# Patient Record
Sex: Female | Born: 1935 | Race: White | Hispanic: No | Marital: Married | Smoking: Former smoker
Health system: Southern US, Community
[De-identification: ages and names within clinical notes are randomized; demographics above are authoritative.]

## PROBLEM LIST (undated history)

## (undated) DIAGNOSIS — Z9289 Personal history of other medical treatment: Secondary | ICD-10-CM

## (undated) DIAGNOSIS — R51 Headache: Secondary | ICD-10-CM

## (undated) DIAGNOSIS — G5620 Lesion of ulnar nerve, unspecified upper limb: Secondary | ICD-10-CM

## (undated) DIAGNOSIS — R7301 Impaired fasting glucose: Secondary | ICD-10-CM

## (undated) DIAGNOSIS — I10 Essential (primary) hypertension: Secondary | ICD-10-CM

## (undated) DIAGNOSIS — Z9981 Dependence on supplemental oxygen: Secondary | ICD-10-CM

## (undated) DIAGNOSIS — R519 Headache, unspecified: Secondary | ICD-10-CM

## (undated) DIAGNOSIS — M1990 Unspecified osteoarthritis, unspecified site: Secondary | ICD-10-CM

## (undated) DIAGNOSIS — R011 Cardiac murmur, unspecified: Secondary | ICD-10-CM

## (undated) DIAGNOSIS — N183 Chronic kidney disease, stage 3 unspecified: Secondary | ICD-10-CM

## (undated) DIAGNOSIS — F419 Anxiety disorder, unspecified: Secondary | ICD-10-CM

## (undated) DIAGNOSIS — I6529 Occlusion and stenosis of unspecified carotid artery: Secondary | ICD-10-CM

## (undated) DIAGNOSIS — J189 Pneumonia, unspecified organism: Secondary | ICD-10-CM

## (undated) DIAGNOSIS — I2510 Atherosclerotic heart disease of native coronary artery without angina pectoris: Secondary | ICD-10-CM

## (undated) DIAGNOSIS — K219 Gastro-esophageal reflux disease without esophagitis: Secondary | ICD-10-CM

## (undated) DIAGNOSIS — I5033 Acute on chronic diastolic (congestive) heart failure: Secondary | ICD-10-CM

## (undated) DIAGNOSIS — M810 Age-related osteoporosis without current pathological fracture: Secondary | ICD-10-CM

## (undated) DIAGNOSIS — E785 Hyperlipidemia, unspecified: Secondary | ICD-10-CM

## (undated) DIAGNOSIS — F0390 Unspecified dementia without behavioral disturbance: Secondary | ICD-10-CM

## (undated) DIAGNOSIS — J42 Unspecified chronic bronchitis: Secondary | ICD-10-CM

## (undated) DIAGNOSIS — I509 Heart failure, unspecified: Secondary | ICD-10-CM

## (undated) DIAGNOSIS — J302 Other seasonal allergic rhinitis: Secondary | ICD-10-CM

## (undated) DIAGNOSIS — I214 Non-ST elevation (NSTEMI) myocardial infarction: Secondary | ICD-10-CM

## (undated) DIAGNOSIS — J449 Chronic obstructive pulmonary disease, unspecified: Secondary | ICD-10-CM

## (undated) DIAGNOSIS — R001 Bradycardia, unspecified: Secondary | ICD-10-CM

## (undated) HISTORY — PX: DILATION AND CURETTAGE OF UTERUS: SHX78

## (undated) HISTORY — PX: FRACTURE SURGERY: SHX138

## (undated) HISTORY — DX: Impaired fasting glucose: R73.01

## (undated) HISTORY — DX: Essential (primary) hypertension: I10

## (undated) HISTORY — PX: TOTAL HIP ARTHROPLASTY: SHX124

## (undated) HISTORY — DX: Atherosclerotic heart disease of native coronary artery without angina pectoris: I25.10

## (undated) HISTORY — DX: Other seasonal allergic rhinitis: J30.2

## (undated) HISTORY — DX: Chronic obstructive pulmonary disease, unspecified: J44.9

## (undated) HISTORY — DX: Hyperlipidemia, unspecified: E78.5

## (undated) HISTORY — DX: Occlusion and stenosis of unspecified carotid artery: I65.29

## (undated) HISTORY — PX: CORONARY ANGIOPLASTY WITH STENT PLACEMENT: SHX49

## (undated) HISTORY — DX: Lesion of ulnar nerve, unspecified upper limb: G56.20

## (undated) HISTORY — DX: Pneumonia, unspecified organism: J18.9

## (undated) HISTORY — DX: Age-related osteoporosis without current pathological fracture: M81.0

## (undated) HISTORY — DX: Bradycardia, unspecified: R00.1

## (undated) HISTORY — DX: Unspecified dementia, unspecified severity, without behavioral disturbance, psychotic disturbance, mood disturbance, and anxiety: F03.90

---

## 1982-02-14 HISTORY — PX: VAGINAL HYSTERECTOMY: SUR661

## 1989-02-14 HISTORY — PX: INCONTINENCE SURGERY: SHX676

## 2001-07-23 ENCOUNTER — Ambulatory Visit (HOSPITAL_COMMUNITY): Admission: RE | Admit: 2001-07-23 | Discharge: 2001-07-23 | Payer: Self-pay | Admitting: Family Medicine

## 2001-07-23 ENCOUNTER — Encounter: Payer: Self-pay | Admitting: Family Medicine

## 2002-05-02 ENCOUNTER — Encounter: Payer: Self-pay | Admitting: Family Medicine

## 2002-05-02 ENCOUNTER — Ambulatory Visit (HOSPITAL_COMMUNITY): Admission: RE | Admit: 2002-05-02 | Discharge: 2002-05-02 | Payer: Self-pay | Admitting: Family Medicine

## 2003-10-24 ENCOUNTER — Ambulatory Visit (HOSPITAL_COMMUNITY): Admission: RE | Admit: 2003-10-24 | Discharge: 2003-10-24 | Payer: Self-pay | Admitting: Family Medicine

## 2005-06-20 ENCOUNTER — Ambulatory Visit (HOSPITAL_COMMUNITY): Admission: RE | Admit: 2005-06-20 | Discharge: 2005-06-20 | Payer: Self-pay | Admitting: Family Medicine

## 2005-12-03 ENCOUNTER — Inpatient Hospital Stay (HOSPITAL_COMMUNITY): Admission: EM | Admit: 2005-12-03 | Discharge: 2005-12-10 | Payer: Self-pay | Admitting: Emergency Medicine

## 2006-01-09 ENCOUNTER — Ambulatory Visit (HOSPITAL_COMMUNITY): Admission: RE | Admit: 2006-01-09 | Discharge: 2006-01-09 | Payer: Self-pay | Admitting: Family Medicine

## 2006-02-14 HISTORY — PX: CARPAL TUNNEL RELEASE: SHX101

## 2006-06-13 ENCOUNTER — Ambulatory Visit (HOSPITAL_BASED_OUTPATIENT_CLINIC_OR_DEPARTMENT_OTHER): Admission: RE | Admit: 2006-06-13 | Discharge: 2006-06-13 | Payer: Self-pay | Admitting: Orthopedic Surgery

## 2006-09-05 ENCOUNTER — Emergency Department (HOSPITAL_COMMUNITY): Admission: EM | Admit: 2006-09-05 | Discharge: 2006-09-05 | Payer: Self-pay | Admitting: Emergency Medicine

## 2007-02-15 DIAGNOSIS — M810 Age-related osteoporosis without current pathological fracture: Secondary | ICD-10-CM

## 2007-02-15 HISTORY — DX: Age-related osteoporosis without current pathological fracture: M81.0

## 2007-03-19 ENCOUNTER — Ambulatory Visit (HOSPITAL_COMMUNITY): Admission: RE | Admit: 2007-03-19 | Discharge: 2007-03-19 | Payer: Self-pay | Admitting: Family Medicine

## 2007-04-19 ENCOUNTER — Inpatient Hospital Stay (HOSPITAL_COMMUNITY): Admission: EM | Admit: 2007-04-19 | Discharge: 2007-04-26 | Payer: Self-pay | Admitting: Emergency Medicine

## 2007-05-23 ENCOUNTER — Ambulatory Visit (HOSPITAL_COMMUNITY): Admission: RE | Admit: 2007-05-23 | Discharge: 2007-05-23 | Payer: Self-pay | Admitting: Family Medicine

## 2007-06-29 ENCOUNTER — Encounter: Payer: Self-pay | Admitting: Internal Medicine

## 2007-07-10 ENCOUNTER — Ambulatory Visit (HOSPITAL_COMMUNITY): Admission: RE | Admit: 2007-07-10 | Discharge: 2007-07-10 | Payer: Self-pay | Admitting: Family Medicine

## 2007-07-16 ENCOUNTER — Ambulatory Visit (HOSPITAL_COMMUNITY): Admission: RE | Admit: 2007-07-16 | Discharge: 2007-07-16 | Payer: Self-pay | Admitting: Family Medicine

## 2007-07-19 ENCOUNTER — Ambulatory Visit: Payer: Self-pay | Admitting: Internal Medicine

## 2007-07-19 DIAGNOSIS — F0390 Unspecified dementia without behavioral disturbance: Secondary | ICD-10-CM

## 2007-07-19 DIAGNOSIS — I10 Essential (primary) hypertension: Secondary | ICD-10-CM

## 2007-07-19 DIAGNOSIS — J449 Chronic obstructive pulmonary disease, unspecified: Secondary | ICD-10-CM

## 2007-07-19 DIAGNOSIS — G5620 Lesion of ulnar nerve, unspecified upper limb: Secondary | ICD-10-CM | POA: Insufficient documentation

## 2007-07-19 DIAGNOSIS — J4489 Other specified chronic obstructive pulmonary disease: Secondary | ICD-10-CM | POA: Insufficient documentation

## 2007-07-19 DIAGNOSIS — J189 Pneumonia, unspecified organism: Secondary | ICD-10-CM

## 2007-07-19 DIAGNOSIS — J45909 Unspecified asthma, uncomplicated: Secondary | ICD-10-CM | POA: Insufficient documentation

## 2007-07-19 DIAGNOSIS — I6529 Occlusion and stenosis of unspecified carotid artery: Secondary | ICD-10-CM | POA: Insufficient documentation

## 2007-07-19 DIAGNOSIS — Z9189 Other specified personal risk factors, not elsewhere classified: Secondary | ICD-10-CM | POA: Insufficient documentation

## 2007-08-22 ENCOUNTER — Ambulatory Visit: Payer: Self-pay | Admitting: Internal Medicine

## 2008-06-18 ENCOUNTER — Ambulatory Visit (HOSPITAL_COMMUNITY): Admission: RE | Admit: 2008-06-18 | Discharge: 2008-06-18 | Payer: Self-pay | Admitting: Family Medicine

## 2008-09-09 ENCOUNTER — Ambulatory Visit (HOSPITAL_COMMUNITY): Admission: RE | Admit: 2008-09-09 | Discharge: 2008-09-09 | Payer: Self-pay | Admitting: Family Medicine

## 2009-06-22 ENCOUNTER — Ambulatory Visit (HOSPITAL_COMMUNITY): Admission: RE | Admit: 2009-06-22 | Discharge: 2009-06-22 | Payer: Self-pay | Admitting: Family Medicine

## 2009-07-30 ENCOUNTER — Ambulatory Visit: Payer: Medicare Other | Admitting: Pain Medicine

## 2009-08-12 ENCOUNTER — Ambulatory Visit: Payer: Medicare Other | Admitting: Pain Medicine

## 2009-08-14 DIAGNOSIS — I2510 Atherosclerotic heart disease of native coronary artery without angina pectoris: Secondary | ICD-10-CM

## 2009-08-14 HISTORY — DX: Atherosclerotic heart disease of native coronary artery without angina pectoris: I25.10

## 2009-08-18 ENCOUNTER — Encounter: Payer: Self-pay | Admitting: Family Medicine

## 2009-08-18 ENCOUNTER — Ambulatory Visit: Payer: Medicare Other | Admitting: Pain Medicine

## 2009-08-19 ENCOUNTER — Inpatient Hospital Stay (HOSPITAL_COMMUNITY): Admission: EM | Admit: 2009-08-19 | Discharge: 2009-08-22 | Payer: Self-pay | Admitting: Internal Medicine

## 2009-08-19 ENCOUNTER — Ambulatory Visit: Payer: Self-pay | Admitting: Cardiovascular Disease

## 2009-08-19 ENCOUNTER — Encounter: Payer: Self-pay | Admitting: Emergency Medicine

## 2009-08-19 DIAGNOSIS — I498 Other specified cardiac arrhythmias: Secondary | ICD-10-CM

## 2009-08-20 ENCOUNTER — Encounter (INDEPENDENT_AMBULATORY_CARE_PROVIDER_SITE_OTHER): Payer: Self-pay | Admitting: Internal Medicine

## 2009-08-27 ENCOUNTER — Telehealth: Payer: Self-pay | Admitting: Cardiovascular Disease

## 2009-09-18 ENCOUNTER — Ambulatory Visit: Payer: Self-pay | Admitting: Cardiovascular Disease

## 2009-09-18 DIAGNOSIS — I2510 Atherosclerotic heart disease of native coronary artery without angina pectoris: Secondary | ICD-10-CM | POA: Insufficient documentation

## 2009-10-02 ENCOUNTER — Telehealth: Payer: Self-pay | Admitting: Cardiovascular Disease

## 2009-10-12 ENCOUNTER — Encounter (HOSPITAL_COMMUNITY): Admission: RE | Admit: 2009-10-12 | Discharge: 2009-11-11 | Payer: Self-pay | Admitting: Cardiovascular Disease

## 2009-11-12 ENCOUNTER — Telehealth: Payer: Self-pay | Admitting: Cardiovascular Disease

## 2009-11-13 ENCOUNTER — Encounter (HOSPITAL_COMMUNITY): Admission: RE | Admit: 2009-11-13 | Discharge: 2009-11-13 | Payer: Self-pay | Admitting: Cardiovascular Disease

## 2009-11-14 ENCOUNTER — Encounter (HOSPITAL_COMMUNITY)
Admission: RE | Admit: 2009-11-14 | Discharge: 2009-12-14 | Payer: Self-pay | Source: Home / Self Care | Admitting: Cardiovascular Disease

## 2009-11-19 ENCOUNTER — Emergency Department (HOSPITAL_COMMUNITY): Admission: EM | Admit: 2009-11-19 | Discharge: 2009-11-19 | Payer: Self-pay | Admitting: Emergency Medicine

## 2009-11-24 ENCOUNTER — Ambulatory Visit (HOSPITAL_COMMUNITY): Admission: RE | Admit: 2009-11-24 | Discharge: 2009-11-24 | Payer: Self-pay | Admitting: Family Medicine

## 2009-12-04 ENCOUNTER — Telehealth: Payer: Self-pay | Admitting: Cardiovascular Disease

## 2009-12-16 ENCOUNTER — Encounter (HOSPITAL_COMMUNITY)
Admission: RE | Admit: 2009-12-16 | Discharge: 2010-01-15 | Payer: Self-pay | Source: Home / Self Care | Admitting: Cardiovascular Disease

## 2009-12-30 ENCOUNTER — Encounter: Payer: Self-pay | Admitting: Cardiovascular Disease

## 2010-01-06 ENCOUNTER — Ambulatory Visit: Payer: Self-pay

## 2010-01-06 ENCOUNTER — Ambulatory Visit: Payer: Self-pay | Admitting: Cardiovascular Disease

## 2010-01-06 ENCOUNTER — Encounter: Payer: Self-pay | Admitting: Cardiovascular Disease

## 2010-01-20 ENCOUNTER — Telehealth (INDEPENDENT_AMBULATORY_CARE_PROVIDER_SITE_OTHER): Payer: Self-pay

## 2010-01-21 ENCOUNTER — Encounter: Payer: Self-pay | Admitting: Internal Medicine

## 2010-01-21 ENCOUNTER — Encounter (HOSPITAL_COMMUNITY)
Admission: RE | Admit: 2010-01-21 | Discharge: 2010-03-16 | Payer: Self-pay | Source: Home / Self Care | Attending: Cardiovascular Disease | Admitting: Cardiovascular Disease

## 2010-01-21 ENCOUNTER — Ambulatory Visit: Payer: Self-pay

## 2010-01-25 ENCOUNTER — Telehealth: Payer: Self-pay | Admitting: Cardiovascular Disease

## 2010-02-22 ENCOUNTER — Encounter: Payer: Self-pay | Admitting: Cardiovascular Disease

## 2010-03-16 NOTE — Progress Notes (Signed)
Summary: Nuc. Pre-Procedure  Phone Note Outgoing Call Call back at [facility] Phone [phone]   Call placed by: Irean Hong, RN,  January 20, 2010 3:05 PM Summary of Call: Reviewed information on Myoview Information Sheet (see scanned document for further details).  Spoke with patient's granddaughter per Robb Matar.     Nuclear Med Background Indications for Stress Test: Evaluation for Ischemia, Stent Patency   History: Asthma, COPD, Heart Catheterization, Myocardial Infarction, Stents  History Comments: 7/11 NSTEMI>Cath:severe LAD stenosis,EF=75%>stent LAD.  Symptoms: Chest Pain, DOE  Symptoms Comments: Radiates to upper back and shoulder.   Nuclear Pre-Procedure Cardiac Risk Factors: Carotid Disease, Hypertension, Lipids Height (in): 64

## 2010-03-16 NOTE — Progress Notes (Signed)
Summary: can take otc med for indigestion-post hospital  Phone Note Call from Patient   Caller: granddtr stacy Reason for Call: Talk to Nurse Summary of Call: pt post hopsital-was told not to take otc meds-wants to know if she can take anything for indigestion? pls call granddaughter stacy [phone] Initial call taken by: Glynda Jaeger,  August 27, 2009 11:45 AM  Follow-up for Phone Call        [facility] Lisabeth Devoid RN Granddaughter Kennyth Arnold returned call.  Mrs. Zogg has been belching a lot. She assured me she was not having any anginal pain.  Has had some burning with the belching and would like something for it.  I told her it would be okay to take TUMS or MAALOX and to call back if this did not relieve it. Lisabeth Devoid RN

## 2010-03-16 NOTE — Progress Notes (Signed)
Summary: Infected tooth  Phone Note From Other Clinic   Caller: Dr Gerda Diss Call For: Leotis Shames  Summary of Call: Dr Gerda Diss is currently seeing this pt in his office.  He is treating the pt for an impacted  tooth. The pt's tooth is infected and he is starting her on an antibiotic.  The pt will need this tooth pulled but she is currently taking Plavix.   This pt had a DES placed in July and cannot stop her plavix at this time.  Dr Gerda Diss said the dentist would not pull the pt's tooth on Plavix and she would probably have to be referred to an oral surgeon.  I made him aware that the pt cannot hold plavix for dental extraction due to recent DES placement and high-risk for stent thrombosis.  Initial call taken by: Julieta Gutting, RN, BSN,  November 12, 2009 10:17 AM     Appended Document: Infected tooth Agree - will need to have tooth extracted without interruption of plavix.

## 2010-03-16 NOTE — Assessment & Plan Note (Signed)
Summary: ROV   Visit Type:  Follow-up Referring Provider:  Dr. Marcelino Freestone Primary Provider:  Dr. Lilyan Punt  CC:  Chest pains.  History of Present Illness: 75 year-old woman presented July 2011 with a NSTEMI after receiving a nerve block for chronic pain. She underwent cardiac cath demostrating severe LAD stenosis and was treated with a drug-eluting stent. Her LVEF was preserved at 75%. She presents today for follow-up evaluation.  She reports an episode of upper back and shoulder when moving her couch recently. She is uncertain whether this was related to muscle strain or cardiac pain. She also has had back problems associated with leg weakness. She reports burning chest pain in the center of her chest. She takes NTG on occasion and this provides relief. Also c/o exertional dyspnea and pedal edema. No other complaints.  Current Medications (verified): 1)  Fosamax 70 Mg  Tabs (Alendronate Sodium) .... Once Wkly 2)  Donepezil Hcl 10 Mg Tabs (Donepezil Hcl) .... Take 1 Tablet By Mouth Once A Day 3)  Benicar 40 Mg  Tabs (Olmesartan Medoxomil) .... Once Daily 4)  Zantac 150 Mg Tabs (Ranitidine Hcl) .... Take One Tablet By Mouth Two Times A Day 5)  Norvasc 5 Mg Tabs (Amlodipine Besylate) .... Take One Tablet Once Daily 6)  Plavix 75 Mg Tabs (Clopidogrel Bisulfate) .... Take One Tablet Once Daily 7)  Pravastatin Sodium 40 Mg Tabs (Pravastatin Sodium) .... Take One Tablet By Mouth Daily At Bedtime 8)  Citalopram Hydrobromide 40 Mg Tabs (Citalopram Hydrobromide) .... Once Daily 9)  Gabapentin 100 Mg Caps (Gabapentin) .... Take One Capsule Three Times A Day 10)  Lorazepam 1 Mg Tabs (Lorazepam) .... At Bedtime 11)  Aspirin 81 Mg Tbec (Aspirin) .... Take One Tablet By Mouth Daily 12)  Nitrostat 0.4 Mg Subl (Nitroglycerin) .... As Needed  Allergies: 1)  ! Sulfa  Past History:  Past medical history reviewed for relevance to current acute and chronic problems.  Past Medical  History: Reviewed history from 09/18/2009 and no changes required. CORONARY ARTERY DISEASE s/p PCI of the LAD July 2011 NON-ST SEGMENT ELEVATED MYOCARDIAL INFARCTION  HYPERTENSION HYPERLIPIDEMIA BRADYCARDIA (ICD-427.89) CAROTID ARTERY STENOSIS (ICD-433.10) COPD UNSPECIFIED (ICD-496) HEADACHE, CHRONIC, HX OF (ICD-V15.9) ASTHMA (ICD-493.90) PNEUMONIA (ICD-486) DEMENTIA (ICD-294.8) ULNAR NEUROPATHY (ICD-354.2)    Review of Systems       Negative except as per HPI   Vital Signs:  Patient profile:   75 year old female Height:      64 inches Weight:      170.50 pounds BMI:     29.37 Pulse rate:   50 / minute Pulse rhythm:   regular Resp:     18 per minute BP sitting:   120 / 56  (left arm) Cuff size:   large  Vitals Entered By: Vikki Ports (January 06, 2010 8:56 AM)  Physical Exam  General:  Pt is alert and oriented, in no acute distress. HEENT: normal Neck: normal carotid upstrokes with a soft left carotid bruit, JVP normal Lungs: CTA CV: RRR with 2/6 systolic murmur at LSB Abd: soft, NT, positive BS, no bruit, no organomegaly Ext: no clubbing, cyanosis, or edema. peripheral pulses 2+ and equal Skin: warm and dry without rash    Carotid Doppler  Procedure date:  01/06/2010  Findings:      40-59% RICA stenosis, 60-79% LICA stenosis  EKG  Procedure date:  01/06/2010  Findings:      Sinus brady 50 bpm, ST-T wave abnormality consider anterolateral ischemia.  Impression & Recommendations:  Problem # 1:  CAD, NATIVE VESSEL (ICD-414.01) Pt with recurrent chest pain, both typical and atypical features. Her EKG is abnormal with ST-T changes that may be related to LVH or ischemic changes. Recommend a Lexiscan Myoview stress test to rule out significant ischemia. Otherwise continue current medical program.  Her updated medication list for this problem includes:    Norvasc 5 Mg Tabs (Amlodipine besylate) .Marland Kitchen... Take one tablet once daily    Plavix 75 Mg Tabs  (Clopidogrel bisulfate) .Marland Kitchen... Take one tablet once daily    Aspirin 81 Mg Tbec (Aspirin) .Marland Kitchen... Take one tablet by mouth daily    Nitrostat 0.4 Mg Subl (Nitroglycerin) .Marland Kitchen... As needed  Orders: Nuclear Stress Test (Nuc Stress Test) EKG w/ Interpretation (93000)  Problem # 2:  CAROTID ARTERY STENOSIS (ICD-433.10) Stable, moderate carotid disease. Continue surveillance carotid duplex studies and medical therapy.  Her updated medication list for this problem includes:    Plavix 75 Mg Tabs (Clopidogrel bisulfate) .Marland Kitchen... Take one tablet once daily    Aspirin 81 Mg Tbec (Aspirin) .Marland Kitchen... Take one tablet by mouth daily  Problem # 3:  HYPERTENSION (ICD-401.9) BP controlled.  Her updated medication list for this problem includes:    Benicar 40 Mg Tabs (Olmesartan medoxomil) ..... Once daily    Norvasc 5 Mg Tabs (Amlodipine besylate) .Marland Kitchen... Take one tablet once daily    Aspirin 81 Mg Tbec (Aspirin) .Marland Kitchen... Take one tablet by mouth daily  BP today: 120/56 Prior BP: 130/64 (09/18/2009)  Patient Instructions: 1)  Your physician recommends that you continue on your current medications as directed. Please refer to the Current Medication list given to you today. 2)  Your physician wants you to follow-up in: 6 MONTHS.  You will receive a reminder letter in the mail two months in advance. If you don't receive a letter, please call our office to schedule the follow-up appointment. 3)  Your physician has requested that you have a Lexiscan myoview.  For further information please visit [email or web address]  Please follow instruction sheet, as given.

## 2010-03-16 NOTE — Progress Notes (Signed)
Summary: cardiac rehab   Phone Note From Other Clinic   Caller: nurse diane Summary of Call: Per Hart [place] needs order for cardiac rehab @ [facility]. ofc [id number] fax [phone] Initial call taken by: Edman Circle,  October 02, 2009 11:19 AM  Follow-up for Phone Call        Per Diane calling back to check on cardiac rehab - referral on 8/19 . [phone] / fax [phone] Lorne Skeens  October 05, 2009 8:41 AM   I spoke with Diane and she will fax a referral form for [facility] cardiac rehab.  Dr Excell Seltzer will sign form on 10/06/09.  Julieta Gutting, RN, BSN  October 05, 2009 9:19 AM

## 2010-03-16 NOTE — Assessment & Plan Note (Signed)
Summary: eph/post cath   Visit Type:  Follow-up Referring Vonna Brabson:  Dr. Marcelino Freestone Primary Cherylann Hobday:  Dr. Lilyan Punt  CC:  eph/post cath.  .  History of Present Illness: 75 year-old woman presented July 2011 with a NSTEMI after receiving a nerve block for chronic pain. She underwent cardiac cath demostrating severe LAD stenosis and was treated with a drug-eluting stent. Her LVEF was preserved at 75%. She presents today for follow-up evaluation.  She feels much better since her PCI procedure. She denies chest pain, dyspnea, edema, palps. She does complain of episodic headache and also complains of GERD sympotms. She has been slowly increasing her activity level without exertional symptoms.    Current Medications (verified): 1)  Fosamax 70 Mg  Tabs (Alendronate Sodium) .... Once Wkly 2)  Donepezil Hcl 5 Mg Tabs (Donepezil Hcl) .... Take One Tablet Once Daily 3)  Benicar 40 Mg  Tabs (Olmesartan Medoxomil) .... Once Daily 4)  Zantac 300 Mg Tabs (Ranitidine Hcl) .... Take One Tablet Once Daily 5)  Norvasc 5 Mg Tabs (Amlodipine Besylate) .... Take One Tablet Once Daily 6)  Plavix 75 Mg Tabs (Clopidogrel Bisulfate) .... Take One Tablet Once Daily 7)  Simvastatin 40 Mg Tabs (Simvastatin) .... At Bedtime 8)  Citalopram Hydrobromide 40 Mg Tabs (Citalopram Hydrobromide) .... Once Daily 9)  Gabapentin 100 Mg Caps (Gabapentin) .... Take One Capsule Three Times A Day 10)  Lorazepam 1 Mg Tabs (Lorazepam) .... At Bedtime 11)  Aspirin 325 Mg Tabs (Aspirin) .... Once Daily 12)  Nitrostat 0.4 Mg Subl (Nitroglycerin) .... As Needed  Allergies (verified): 1)  ! Sulfa  Past History:  Past medical history reviewed for relevance to current acute and chronic problems.  Past Medical History: CORONARY ARTERY DISEASE s/p PCI of the LAD July 2011 NON-ST SEGMENT ELEVATED MYOCARDIAL INFARCTION  HYPERTENSION HYPERLIPIDEMIA BRADYCARDIA (ICD-427.89) CAROTID ARTERY STENOSIS (ICD-433.10) COPD  UNSPECIFIED (ICD-496) HEADACHE, CHRONIC, HX OF (ICD-V15.9) ASTHMA (ICD-493.90) PNEUMONIA (ICD-486) DEMENTIA (ICD-294.8) ULNAR NEUROPATHY (ICD-354.2)    Review of Systems       Negative except as per HPI   Vital Signs:  Patient profile:   75 year old female Height:      64 inches Weight:      169 pounds BMI:     29.11 Pulse rate:   57 / minute Pulse rhythm:   regular BP sitting:   130 / 64  (left arm)  Vitals Entered By: Judithe Modest CMA (September 18, 2009 2:34 PM)  Physical Exam  General:  Pt is alert and oriented, in no acute distress. HEENT: normal Neck: normal carotid upstrokes with a soft left carotid bruit, JVP normal Lungs: CTA CV: RRR with 2/6 systolic murmur at LSB Abd: soft, NT, positive BS, no bruit, no organomegaly Ext: no clubbing, cyanosis, or edema. peripheral pulses 2+ and equal Skin: warm and dry without rash    EKG  Procedure date:  09/18/2009  Findings:      Sinus bradycardia 57 bpm, T wave abnormality consider lateral ischemia.  Impression & Recommendations:  Problem # 1:  CAD, NATIVE VESSEL (ICD-414.01) Pt is doing well - I advised her to continue her current medical program. She can continue to increase her activity level. I advised her to continue with over the counter H2 blockers for acid reflux to avoid PPI-clopidogrel interaction.   The following medications were removed from the medication list:    Verelan 240 Mg Cp24 (Verapamil hcl) ..... Once daily Her updated medication list for this problem  includes:    Norvasc 5 Mg Tabs (Amlodipine besylate) .Marland Kitchen... Take one tablet once daily    Plavix 75 Mg Tabs (Clopidogrel bisulfate) .Marland Kitchen... Take one tablet once daily    Aspirin 81 Mg Tbec (Aspirin) .Marland Kitchen... Take one tablet by mouth daily    Nitrostat 0.4 Mg Subl (Nitroglycerin) .Marland Kitchen... As needed  Problem # 2:  HYPERTENSION (ICD-401.9) Well-controlled on current Rx.  The following medications were removed from the medication list:    Verelan 240  Mg Cp24 (Verapamil hcl) ..... Once daily Her updated medication list for this problem includes:    Benicar 40 Mg Tabs (Olmesartan medoxomil) ..... Once daily    Norvasc 5 Mg Tabs (Amlodipine besylate) .Marland Kitchen... Take one tablet once daily    Aspirin 81 Mg Tbec (Aspirin) .Marland Kitchen... Take one tablet by mouth daily  Orders: EKG w/ Interpretation (93000) Carotid Duplex (Carotid Duplex)  BP today: 130/64 Prior BP: 130/68 (08/22/2007)  Problem # 3:  CAROTID ARTERY STENOSIS (ICD-433.10) Pt with a left carotid bruit - will check a carotid duplex at follow-up in 3 months.  Her updated medication list for this problem includes:    Plavix 75 Mg Tabs (Clopidogrel bisulfate) .Marland Kitchen... Take one tablet once daily    Aspirin 81 Mg Tbec (Aspirin) .Marland Kitchen... Take one tablet by mouth daily  Orders: EKG w/ Interpretation (93000) Carotid Duplex (Carotid Duplex)  Patient Instructions: 1)  Your physician recommends that you schedule a follow-up appointment in: 3 MONTHS 2)  Your physician has recommended you make the following change in your medication: DECREASE Aspirin to 81mg  once a day, Change Zantac to 150mg  two times a day  3)  Your physician has requested that you have a carotid duplex in 3 MONTHS. This test is an ultrasound of the carotid arteries in your neck. It looks at blood flow through these arteries that supply the brain with blood. Allow one hour for this exam. There are no restrictions or special instructions.

## 2010-03-16 NOTE — Progress Notes (Signed)
Summary: pt needs letter to get massage  Phone Note From Other Clinic Call back at [phone]   Caller: Galen Manila from [employer] Request: Talk with Nurse, Talk with Provider Summary of Call: pt is having lower back pain and would like to get a message but with her cardiac issues the massage therapist would like to written confirmation that it is ok for the patient to have this done. You have to call prior to faxing so they can set fax machine up Initial call taken by: Omer Jack,  December 04, 2009 8:28 AM  Follow-up for Phone Call        Pt at low risk of cardiac problems from back massage. Follow-up by: Norva Karvonen, MD,  December 08, 2009 5:34 PM

## 2010-03-16 NOTE — Letter (Signed)
Summary: Cardiac Rehab Program  Cardiac Rehab Program   Imported By: Marylou Mccoy 10/15/2009 10:38:59  _____________________________________________________________________  External Attachment:    Type:   Image     Comment:   External Document

## 2010-03-18 NOTE — Miscellaneous (Signed)
Summary: [name]   [name]   Imported By: Roderic Ovens 02/03/2010 15:57:50  _____________________________________________________________________  External Attachment:    Type:   Image     Comment:   External Document

## 2010-03-18 NOTE — Progress Notes (Signed)
Summary: stress test results  Phone Note Call from Patient Call back at Home Phone [phone] Call back at [phone]   Caller: Daughter/stacy Reason for Call: Talk to Nurse Summary of Call: re stress test results. Initial call taken by: Roe Coombs,  January 25, 2010 1:23 PM  Follow-up for Phone Call        Results given to the pt's daughter and she will make pt aware of results.  Follow-up by: Julieta Gutting, RN, BSN,  January 25, 2010 2:52 PM

## 2010-03-18 NOTE — Letter (Signed)
Summary: Session Report  Session Report   Imported By: Marylou Mccoy 02/04/2010 15:00:54  _____________________________________________________________________  External Attachment:    Type:   Image     Comment:   External Document

## 2010-03-18 NOTE — Assessment & Plan Note (Signed)
Summary: Cardiology Nuclear Testing  Nuclear Med Background Indications for Stress Test: Evaluation for Ischemia, Stent Patency   History: Asthma, COPD, Heart Catheterization, Myocardial Infarction, Stents  History Comments: 7/11 NSTEMI>Cath:severe LAD stenosis,EF=75%>stent LAD.  Symptoms: Chest Pain, Chest Pressure, Chest Tightness, DOE  Symptoms Comments: Radiates to upper back and shoulder.   Nuclear Pre-Procedure Cardiac Risk Factors: Carotid Disease, Hypertension, Lipids Caffeine/Decaff Intake: None NPO After: 6:00 PM Lungs: clear IV 0.9% NS with Angio Cath: 22g     IV Site: R Antecubital IV Started by: Bonnita Levan, RN Chest Size (in) 36     Cup Size C     Height (in): 64 Weight (lb): 168 BMI: 28.94  Nuclear Med Study 1 or 2 day study:  1 day     Stress Test Type:  Eugenie Birks Reading MD:  Arvilla Meres, MD     Referring MD:  M.Cooper Resting Radionuclide:  Technetium 27m Tetrofosmin     Resting Radionuclide Dose:  11 mCi  Stress Radionuclide:  Technetium 37m Tetrofosmin     Stress Radionuclide Dose:  33 mCi   Stress Protocol  Max Systolic BP: 144 mm Hg Lexiscan: 0.4 mg   Stress Test Technologist:  Milana Na, EMT-P     Nuclear Technologist:  Domenic Polite, CNMT  Rest Procedure  Myocardial perfusion imaging was performed at rest 45 minutes following the intravenous administration of Technetium 46m Tetrofosmin.  Stress Procedure  The patient received IV Lexiscan 0.4 mg over 15-seconds.  Technetium 76m Tetrofosmin injected at 30-seconds.  There were no significant changes with infusion.  Quantitative spect images were obtained after a 45 minute delay.  QPS Raw Data Images:  Normal; no motion artifact; normal heart/lung ratio. Stress Images:  There is mildly decreased uptake in the anterior wall. Rest Images:  There is mildly decreased uptake in the anterior wall. Subtraction (SDS):  Previous anterior infarct with trivial peri-infarct signifcant  ischemia. Transient Ischemic Dilatation:  1.17  (Normal <1.22)  Lung/Heart Ratio:  .25  (Normal <0.45)  Quantitative Gated Spect Images QGS EDV:  83 ml QGS ESV:  35 ml QGS EF:  57 % QGS cine images:  Mild hypokinesis of the mid to distal anterior wall and apex. Dyskinesis of distal septum.   Findings Abnormal nuclear study      Overall Impression  Exercise Capacity: Lexiscan with no exercise. ECG Impression: No significant ST segment change with.Lexiscan Overall Impression: Abnormal stress nuclear study. Overall Impression Comments: Previous anterior infarct with trivial peri-infarct signifcant ischemia.  Appended Document: Cardiology Nuclear Testing only trivial ischemia noted with prior infarction. continue medical management.  Appended Document: Cardiology Nuclear Testing Results given to the pt's daughter Misty Stanley.

## 2010-03-24 NOTE — Letter (Signed)
Summary: [facility] - Cardiac Rehab Program  [facility] - Cardiac Rehab Program   Imported By: Marylou Mccoy 03/18/2010 13:46:16  _____________________________________________________________________  External Attachment:    Type:   Image     Comment:   External Document

## 2010-04-29 LAB — DIFFERENTIAL
Basophils Absolute: 0 10*3/uL (ref 0.0–0.1)
Lymphocytes Relative: 9 % — ABNORMAL LOW (ref 12–46)
Monocytes Absolute: 0.4 10*3/uL (ref 0.1–1.0)
Neutro Abs: 6.4 10*3/uL (ref 1.7–7.7)
Neutrophils Relative %: 84 % — ABNORMAL HIGH (ref 43–77)

## 2010-04-29 LAB — BASIC METABOLIC PANEL
BUN: 15 mg/dL (ref 6–23)
Chloride: 105 mEq/L (ref 96–112)
GFR calc Af Amer: >60 mL/min (ref >60)
Glucose, Bld: 108 mg/dL — ABNORMAL HIGH (ref 70–99)
Potassium: 4.2 mEq/L (ref 3.5–5.1)

## 2010-04-29 LAB — POCT CARDIAC MARKERS
CKMB, poc: 1.8 ng/mL (ref 1.0–8.0)
Myoglobin, poc: 43.1 ng/mL (ref 12–200)
Myoglobin, poc: 43.3 ng/mL (ref 12–200)
Troponin i, poc: <0.05 ng/mL (ref 0.00–0.09)

## 2010-04-29 LAB — CBC
Hemoglobin: 12 g/dL (ref 12.0–15.0)
MCH: 29.9 pg (ref 26.0–34.0)
MCV: 89.4 fL (ref 78.0–100.0)
Platelets: 151 10*3/uL (ref 150–400)
RBC: 4.01 MIL/uL (ref 3.87–5.11)

## 2010-05-02 LAB — DIFFERENTIAL
Basophils Absolute: 0.1 10*3/uL (ref 0.0–0.1)
Basophils Relative: 0 % (ref 0–1)
Eosinophils Relative: 3 % (ref 0–5)
Lymphocytes Relative: 36 % (ref 12–46)
Lymphs Abs: 1.9 10*3/uL (ref 0.7–4.0)
Monocytes Absolute: 0.5 10*3/uL (ref 0.1–1.0)
Neutro Abs: 2.7 10*3/uL (ref 1.7–7.7)
Neutrophils Relative %: 52 % (ref 43–77)

## 2010-05-02 LAB — COMPREHENSIVE METABOLIC PANEL
ALT: 12 U/L (ref 0–35)
AST: 16 U/L (ref 0–37)
Albumin: 3.4 g/dL — ABNORMAL LOW (ref 3.5–5.2)
Albumin: 3.9 g/dL (ref 3.5–5.2)
Alkaline Phosphatase: 42 U/L (ref 39–117)
Alkaline Phosphatase: 48 U/L (ref 39–117)
BUN: 14 mg/dL (ref 6–23)
BUN: 18 mg/dL (ref 6–23)
Chloride: 100 mEq/L (ref 96–112)
GFR calc Af Amer: >60 mL/min (ref >60)
GFR calc Af Amer: >60 mL/min (ref >60)
GFR calc non Af Amer: 54 mL/min — ABNORMAL LOW (ref >60)
Glucose, Bld: 179 mg/dL — ABNORMAL HIGH (ref 70–99)
Potassium: 3.5 mEq/L (ref 3.5–5.1)
Potassium: 4.6 mEq/L (ref 3.5–5.1)
Sodium: 134 mEq/L — ABNORMAL LOW (ref 135–145)
Sodium: 137 mEq/L (ref 135–145)
Total Bilirubin: 0.4 mg/dL (ref 0.3–1.2)
Total Bilirubin: 0.4 mg/dL (ref 0.3–1.2)
Total Protein: 5.9 g/dL — ABNORMAL LOW (ref 6.0–8.3)
Total Protein: 6.6 g/dL (ref 6.0–8.3)

## 2010-05-02 LAB — CARDIAC PANEL(CRET KIN+CKTOT+MB+TROPI)
CK, MB: 3.3 ng/mL (ref 0.3–4.0)
Relative Index: INVALID (ref 0.0–2.5)
Total CK: 48 U/L (ref 7–177)
Troponin I: 0.13 ng/mL — ABNORMAL HIGH (ref 0.00–0.06)
Troponin I: 0.17 ng/mL — ABNORMAL HIGH (ref 0.00–0.06)
Troponin I: 0.22 ng/mL — ABNORMAL HIGH (ref 0.00–0.06)

## 2010-05-02 LAB — CBC
MCH: 30 pg (ref 26.0–34.0)
MCV: 87.5 fL (ref 78.0–100.0)
MCV: 88.6 fL (ref 78.0–100.0)
MCV: 88.9 fL (ref 78.0–100.0)
MCV: 88.9 fL (ref 78.0–100.0)
Platelets: 141 10*3/uL — ABNORMAL LOW (ref 150–400)
Platelets: 142 10*3/uL — ABNORMAL LOW (ref 150–400)
Platelets: 156 10*3/uL (ref 150–400)
Platelets: 175 10*3/uL (ref 150–400)
RBC: 3.97 MIL/uL (ref 3.87–5.11)
RBC: 4.23 MIL/uL (ref 3.87–5.11)
RDW: 13.6 % (ref 11.5–15.5)
RDW: 13.7 % (ref 11.5–15.5)
RDW: 13.7 % (ref 11.5–15.5)
RDW: 13.8 % (ref 11.5–15.5)
WBC: 5.3 10*3/uL (ref 4.0–10.5)
WBC: 5.5 10*3/uL (ref 4.0–10.5)
WBC: 7.3 10*3/uL (ref 4.0–10.5)

## 2010-05-02 LAB — URINALYSIS, ROUTINE W REFLEX MICROSCOPIC
Glucose, UA: NEGATIVE mg/dL
Hgb urine dipstick: NEGATIVE
Ketones, ur: NEGATIVE mg/dL
Protein, ur: NEGATIVE mg/dL
Specific Gravity, Urine: 1.02 (ref 1.005–1.030)
Urobilinogen, UA: 0.2 mg/dL (ref 0.0–1.0)
pH: 6 (ref 5.0–8.0)

## 2010-05-02 LAB — CK TOTAL AND CKMB (NOT AT ARMC)
CK, MB: 3 ng/mL (ref 0.3–4.0)
Relative Index: INVALID (ref 0.0–2.5)
Total CK: 77 U/L (ref 7–177)

## 2010-05-02 LAB — PROTIME-INR
INR: 0.99 (ref 0.00–1.49)
Prothrombin Time: 13.3 seconds (ref 11.6–15.2)

## 2010-05-02 LAB — HEPARIN LEVEL (UNFRACTIONATED)
Heparin Unfractionated: 0.36 IU/mL (ref 0.30–0.70)
Heparin Unfractionated: 0.39 IU/mL (ref 0.30–0.70)
Heparin Unfractionated: 0.41 IU/mL (ref 0.30–0.70)

## 2010-05-02 LAB — BASIC METABOLIC PANEL
BUN: 14 mg/dL (ref 6–23)
BUN: 16 mg/dL (ref 6–23)
Calcium: 8.4 mg/dL (ref 8.4–10.5)
Chloride: 106 mEq/L (ref 96–112)
Creatinine, Ser: 0.93 mg/dL (ref 0.4–1.2)
GFR calc Af Amer: >60 mL/min (ref >60)
GFR calc non Af Amer: 59 mL/min — ABNORMAL LOW (ref >60)
GFR calc non Af Amer: >60 mL/min (ref >60)
Glucose, Bld: 91 mg/dL (ref 70–99)
Potassium: 4.3 mEq/L (ref 3.5–5.1)
Sodium: 136 mEq/L (ref 135–145)

## 2010-05-02 LAB — APTT: aPTT: 26 seconds (ref 24–37)

## 2010-05-02 LAB — BRAIN NATRIURETIC PEPTIDE
Pro B Natriuretic peptide (BNP): 587 pg/mL — ABNORMAL HIGH (ref 0.0–100.0)
Pro B Natriuretic peptide (BNP): 607 pg/mL — ABNORMAL HIGH (ref 0.0–100.0)

## 2010-05-02 LAB — TROPONIN I: Troponin I: 0.02 ng/mL (ref 0.00–0.06)

## 2010-05-02 LAB — LIPID PANEL
HDL: 40 mg/dL (ref >39)
Total CHOL/HDL Ratio: 5.1 RATIO
VLDL: 26 mg/dL (ref 0–40)

## 2010-05-02 LAB — MAGNESIUM: Magnesium: 1.9 mg/dL (ref 1.5–2.5)

## 2010-06-01 ENCOUNTER — Other Ambulatory Visit (HOSPITAL_COMMUNITY): Payer: Self-pay | Admitting: Family Medicine

## 2010-06-01 DIAGNOSIS — Z139 Encounter for screening, unspecified: Secondary | ICD-10-CM

## 2010-06-28 ENCOUNTER — Ambulatory Visit (HOSPITAL_COMMUNITY)
Admission: RE | Admit: 2010-06-28 | Discharge: 2010-06-28 | Disposition: A | Discharge status: Home / Self Care | Payer: Medicare Other | Source: Ambulatory Visit | Attending: Family Medicine | Admitting: Family Medicine

## 2010-06-28 DIAGNOSIS — Z139 Encounter for screening, unspecified: Secondary | ICD-10-CM

## 2010-06-28 DIAGNOSIS — Z1231 Encounter for screening mammogram for malignant neoplasm of breast: Secondary | ICD-10-CM | POA: Insufficient documentation

## 2010-06-29 NOTE — Group Therapy Note (Signed)
NAMENIKITTA, SOBIECH              ACCOUNT NO.:  [id number]   MEDICAL RECORD NO.:  [id number]          PATIENT TYPE:  INP   LOCATION:  A312                          FACILITY:  [facility]   PHYSICIAN:  Scott A. Gerda Diss, MD    DATE OF BIRTH:  [birth year]   DATE OF PROCEDURE:  04/25/2007  DATE OF DISCHARGE:                                 PROGRESS NOTE   Overall doing much better, although blood pressure is running high and  now the creatinine is down to normal so we can re-add the ACE inhibitor.  In addition to this, she is breathing much better.  Still has some  coughing and wheezing,  but not as bad.  We are going to check O2 on  room and see how that is doing and possibly she will be able to come off  of oxygen. I have really think she is within the next 24 hours being  able to go home and will go from there.      Scott A. Gerda Diss, MD  Electronically Signed     SAL/MEDQ  D:  04/25/2007  T:  04/25/2007  Job:  [id number]

## 2010-06-29 NOTE — H&P (Signed)
[name], [name]              ACCOUNT NO.:  [id number]   MEDICAL RECORD NO.:  [id number]          PATIENT TYPE:  INP   LOCATION:  A316                          FACILITY:  [facility]   PHYSICIAN:  Skeet Latch, DO    DATE OF BIRTH:  02/09/1936   DATE OF ADMISSION:  04/19/2007  DATE OF DISCHARGE:  LH                              HISTORY & PHYSICAL   PRIMARY CARE PHYSICIAN:  Scott A. Gerda Diss, MD   CHIEF COMPLAINT:  Altered mental status.   HISTORY OF PRESENT ILLNESS:  This is a 75 year old Caucasian female, who  presents with family members with apparent episode of altered mental  status with some syncope.  The history is very unclear at this time, but  family members in the room at this time state that the patient has been  under the weather for the past few days with some type of viral illness.  Apparently today the patient took a bath; when she tried to get out she  became very lethargic and apparently had a syncopal episode with some  mild mental status changes.  Apparently the patient continued to have  some unresponsive type episodes over the next hour or so, and EMS was  called.  The patient does not have a history of any similar episodes in  the past.   Upon examination in the emergency room a chest x-ray was performed.  The  patient was found to have right lower lobe pneumonia.   PAST MEDICAL HISTORY:  Hypertension.   ALLERGIES:  QUESTIONABLE ALLERGY TO ACETAMINOPHEN.  SHE IS ALSO ALLERGIC  TO SULFA.   SOCIAL HISTORY:  She is a half-pack a day smoker for over 50 years.  No  history of alcohol or illicit drug use.   HOME MEDICATIONS:  1. Verapamil 180 mg p.o. daily.  2. Oxycodone 5 mg p.o. q.4 h. as needed.  3. Allegra 180 mg daily.  4. Aricept 10 mg daily.  5. Cymbalta 60 mg daily.  6. Ativan 1 mg at bedtime.   REVIEW OF SYSTEMS:  CONSTITUTIONAL:  No weight loss, weight gain or  appetite changes.  CARDIOVASCULAR:  No chest pain, palpitations.  RESPIRATORY:  Some  wheezing.  No dyspnea.  MUSCULOSKELETAL:  No  arthralgias or myalgias.  GENITOURINARY:  No urgency or frequency.  NEUROLOGIC:  She admits to an episode of left-sided weakness that has  resolved.  She is alert and oriented, awake and alert.   PHYSICAL EXAMINATION:  VITAL SIGNS:  Temperature 98, pulse 69,  respirations 20, blood pressure 98/46.  GENERAL:  She is well nourished, well hydrated, well developed.  No  acute distress.  HEENT:  Head is normocephalic and atraumatic.  PERRLA.  EOMI.  Right eye  cannot be opened, secondary to surgery.  NECK:  Supple, nontender, nondistended.  No JVD.  No adenopathy  appreciated.  LUNGS:  She showed some bilateral wheezing posteriorly.  No rhonchi or  rales.  ABDOMEN:  Obese, soft, nontender and nondistended.  Positive bowel  sounds.  EXTREMITIES:  No clubbing, cyanosis or edema.   LABS:  White count 19.2, hemoglobin 11.1,  hematocrit 32.3, platelets  158.  Sodium 130, potassium 3.8, chloride 101, CO2 21, glucose 150, BUN  37, creatinine 2.06.  Urinalysis with no glucose, small bilirubin,  __________ ketones, no blood, positive for protein, no nitrites or  leukocytes and few bacteria present.   ASSESSMENT:  1. Right lower lobe pneumonia.  2. Hypotension.  3. Acute renal insufficiency.  4. Hyponatremia.   PLAN:  1. For her right lower lobe pneumonia the patient will be started on      IV Rocephin and IV Zithromax.  Unsure if the emergency room got      blood cultures at this time.  The patient will be placed on      nebulizer treatments every 4 hours and as needed.  We will refrain      from steroids at this time, unless the patient's wheezing does not      improve.  2. Hypotension.  Will hold all her blood pressure medications at this      time. We will give patient a bolus of normal saline, and continue      her on IV fluids.  We will get a urine culture to rule out any      urinary tract infection.  3. Acute renal insufficiency.  Unsure  of her baseline; do not see any      previous labs. This may be secondary to dehydration; hopefully IV      fluids will improve her renal function.  4. Hyponatremia.  The patient probably will need to be on fluid      restrictions, but the patient needs to be IV hydrated at this time.      I will IV hydrate her with normal saline; hopefully this improves      her sodium at this time.   The patient will be placed on DVT as well as GI prophylaxis.      Skeet Latch, DO  Electronically Signed     SM/MEDQ  D:  04/20/2007  T:  04/20/2007  Job:  [id number]   cc:   Lorin Picket A. Gerda Diss, MD  Fax: [phone]

## 2010-06-29 NOTE — Discharge Summary (Signed)
NAMEJENEVIEVE, KIRSCHBAUM              ACCOUNT NO.:  [id number]   MEDICAL RECORD NO.:  [id number]          PATIENT TYPE:  INP   LOCATION:  A312                          FACILITY:  [facility]   PHYSICIAN:  Scott A. Gerda Diss, MD    DATE OF BIRTH:  March 22, 1935   DATE OF ADMISSION:  04/19/2007  DATE OF DISCHARGE:  03/12/2009LH                               DISCHARGE SUMMARY   DISCHARGE DIAGNOSES:  1. Pneumonia.  2. Pleural effusion.  3. Reactive airway.  4. Hypoxemia.  5. Hypertension.  6. Hyperglycemia induced by steroids.   HOSPITAL COURSE:  This patient was admitted in with pneumonia and  reactive airway, shortness of breath, gradually got better, but she had  a pretty difficult course.  She was somewhat disoriented when she came  in and even had a passing out spell at home, but the disorientation had  passed by the time she was admitted in, and there was no syncope while  in the hospital.  Her oxygen levels did require supplementation.  Room  air O2 saturation was 90 near time of discharge.  Repeated chest x-ray  did not show any major consolidation but showed lower basilar  infiltrates with some pleural effusion. She is a smoker.  She has been  told the importance of quitting. No other particular issues.   We will send her home on her usual medicines including:  1. Fosamax 70 mg weekly.  2. Benazepril 10 mg daily.  3. Verapamil 180 mg daily.  4. Aricept 10 mg daily,  5. Trazodone 50 mg daily.  6. __________  200 mg once a week.  7. Lyrica 75 mg t.i.d.  8. Singulair once daily.  9. Albuterol neb treatments on a frequent basis.  10.Quick prednisone taper.  11.Levaquin x7 days.   FOLLOW UP:  Accordingly and see the patient back next week.      Scott A. Gerda Diss, MD  Electronically Signed     SAL/MEDQ  D:  04/26/2007  T:  04/27/2007  Job:  [id number]

## 2010-06-29 NOTE — Group Therapy Note (Signed)
[name], [name]              ACCOUNT NO.:  [id number]   MEDICAL RECORD NO.:  [id number]          PATIENT TYPE:  INP   LOCATION:  A316                          FACILITY:  [facility]   PHYSICIAN:  Dorris Singh, DO    DATE OF BIRTH:  03-28-1935   DATE OF PROCEDURE:  04/21/2007  DATE OF DISCHARGE:                                 PROGRESS NOTE   HISTORY:  [name] was seen today with family in room stating that  she is feeling a little bit better.  States that her breathing is better  but she still feels like she is wheezing a little bit.  Will go ahead  and add steroids to her regimen and may give her breathing treatments  around the clock to help with that as well.   PHYSICAL EXAMINATION:  VITAL SIGNS:  Her vitals are as follows,  temperature is 99.5, that is T-max; pulse 76, respirations 20, blood  pressure 161/77.  GENERAL:  This is a 75 year old Caucasian female who is well-developed,  well-nourished, in no acute distress.  Answers questions appropriately.  HEART:  Regular rate and rhythm.  LUNGS:  Decreased breath sounds in right lower lobe.  There are crackles  present.  ABDOMEN:  Soft, nontender, nondistended.  EXTREMITIES:  Positive pulses.  No ecchymosis or edema noted.   Her labs for today are as follows.  She has a BMET done and the only  abnormality is her glucose is 188.  Everything else is within normal  limits.   ASSESSMENT AND PLAN:  1. Right lower lobe pneumonia.  Will continue with antibiotic therapy      and will increase her breathing treatments and add some steroids at      this point in time.  2. Hypertension.  Apparently her blood pressure has gone back to      normal so we will continue her on her blood pressure medication.  3. Acute renal insufficiency.  This seems to have improved.  This was      probably due to dehydration.  4. Hyponatremia.  This has also improved as well.  Will continue to      monitor the patient and make changes as  necessary.      Dorris Singh, DO  Electronically Signed     CB/MEDQ  D:  04/21/2007  T:  04/21/2007  Job:  [id number]

## 2010-06-29 NOTE — Group Therapy Note (Signed)
[name], [name]              ACCOUNT NO.:  [id number]   MEDICAL RECORD NO.:  [id number]          PATIENT TYPE:  INP   LOCATION:  A316                          FACILITY:  [facility]   PHYSICIAN:  Dorris Singh, DO    DATE OF BIRTH:  November 10, 1935   DATE OF PROCEDURE:  04/22/2007  DATE OF DISCHARGE:                                 PROGRESS NOTE   HISTORY:  The patient is seen today resting in bed comfortably.  Husband  is in the room with her.  They report that last night the patient had a  very bad night.  She was very agitated between the hours of 4 and 8 last  night.  I explained to him that it could be sundowning.  Also he stated  that she was given a pain pill and they were very upset about that.  He  relates a history of her abusing narcotics due to her trigeminal  neuralgia if I understand his symptomatology correctly and so they do  not want her on any pain medication and I told him that was fine and we  will go ahead and DC that order.  Also that I would order some Ativan  for her if she does become confused and agitated.  He seemed okay with  that plan.   PHYSICAL EXAMINATION:  VITAL SIGNS:  Her temperature is 97.6, pulse 86,  respirations 20, blood pressure 157/88.  GENERAL:  This is a 75 year old female who is well-developed, well-  nourished and currently in no acute distress.  HEART:  Regular rate and rhythm.  LUNGS:  Decreased breath sounds on the right that are improved from  yesterday but there are crackles as well.  ABDOMEN:  Soft, nontender and nondistended.  EXTREMITIES:  Positive pulses.  No ecchymosis, cyanosis or edema noted.   LABS:  Her white count is 4.8, hemoglobin 10.0, hematocrit 29.3,  platelet count 155.  Her chemistry is sodium 135, potassium 4.1,  chloride 104, CO2 24, glucose 212, BUN 9 and creatinine 0.78.  All this  is improved.   DIAGNOSIS:  Right lower lobe pneumonia.  Continue with antibiotic  therapy.  Her white count is now within normal  limits.  Will also  continue with nebulizing treatments.  Her hypertension is slightly  improved as well.  Will continue her on her home medications.  May need  to add something but will let her primary care decide that.  Acute renal  sufficiency is improved and this is possibly due to the dehydration.  Hyponatremia has also improved as well.  Agitation, will have Ativan  ordered per family.      Dorris Singh, DO  Electronically Signed     CB/MEDQ  D:  04/22/2007  T:  04/22/2007  Job:  [id number]

## 2010-06-30 ENCOUNTER — Ambulatory Visit (HOSPITAL_COMMUNITY)
Admission: RE | Admit: 2010-06-30 | Discharge: 2010-06-30 | Disposition: A | Discharge status: Home / Self Care | Payer: Medicare Other | Source: Ambulatory Visit | Attending: Physical Therapy | Admitting: Physical Therapy

## 2010-06-30 DIAGNOSIS — M6281 Muscle weakness (generalized): Secondary | ICD-10-CM | POA: Insufficient documentation

## 2010-06-30 DIAGNOSIS — IMO0001 Reserved for inherently not codable concepts without codable children: Secondary | ICD-10-CM | POA: Insufficient documentation

## 2010-06-30 DIAGNOSIS — M545 Low back pain, unspecified: Secondary | ICD-10-CM | POA: Insufficient documentation

## 2010-06-30 DIAGNOSIS — R262 Difficulty in walking, not elsewhere classified: Secondary | ICD-10-CM | POA: Insufficient documentation

## 2010-07-02 NOTE — Op Note (Signed)
NAMESHERRINE, [name]              ACCOUNT NO.:  [id number]   MEDICAL RECORD NO.:  [id number]          PATIENT TYPE:  AMB   LOCATION:  DSC                          FACILITY:  [facility]   PHYSICIAN:  Katy Fitch. Sypher, M.D. DATE OF BIRTH:  17-Apr-1935   DATE OF PROCEDURE:  DATE OF DISCHARGE:                               OPERATIVE REPORT   PREOPERATIVE DIAGNOSIS:  Severe McGowan grade 3 ulnar neuropathy, right  cubital tunnel, with positive electrodiagnostic studies documenting  severe sensorimotor neuropathy of right ulnar nerve performed by Dr.  Kelli Hope of [facility].   POSTOPERATIVE DIAGNOSIS:  Severe McGowan grade 3 ulnar neuropathy, right  cubital tunnel, with positive electrodiagnostic studies documenting  severe sensorimotor neuropathy of right ulnar nerve performed by Dr.  Kelli Hope of [facility].   OPERATION:  Decompression of right ulnar nerve at cubital tunnel with  partial resection of medial head of triceps and triceps aponeurosis.   OPERATING SURGEON:  Katy Fitch. Sypher, MD   ASSISTANT:  Annye Rusk PA-C.   ANESTHESIA:  General by LMA, supervising anesthesiologist is Dr. Gypsy Balsam.   INDICATIONS:  [name] is a 75 year old woman referred through the  courtesy of Santina Evans A. Orlin Hilding, M.D., for evaluation and management of  a severe right ulnar neuropathy.   Dr. Orlin Hilding initially consulted on [name] at the request of Dr.  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   An electrodiagnostic study was performed by Dr. Kelli Hope with  identification of an apparent severe right ulnar neuropathy at the level  the cubital tunnel and a mild right median neuropathy at the level of  the  wrist.  Clinically, [name] was unaware of any numbness in her  median-innervated fingers.   [name] was referred for an upper extremity orthopedic consult.  On  clinical examination she was noted to have clawing of her ring and small  fingers to the right hand, marked weakness of finger abduction and  abduction, and weak pinch with a positive Froment sign.   We recommended decompression of her right ulnar nerve at the cubital  tunnel.   We discussed possible anterior transposition if the nerve was found to  be unstable.   Preoperatively the family was advised that her age with her multiple  medical problems and a possible postherpetic neuralgia predicament, we  could not guarantee any particular outcome following decompression.   Past experience with similar McGowan III- level severe ulnar  neuropathies has revealed an uneven outcome, typically younger patients  having a better result than more elderly patients and patients with no  underlying neuropathy doing better than those with neuropathy.   After informed consent, she is brought to the operating room at this  time.   PROCEDURE:  [name] was brought to the operating room and placed  in supine position on the operating table.  Dr. Gypsy Balsam performed  an  anesthesia consult prior to surgery in the holding area and recommended  general anesthesia by LMA technique.   [name] was brought to room #6, placed in supine position on the  operating table, and under Dr. Burnett Corrente strict supervision general  anesthesia by LMA technique induced.   Her right arm was prepped with Betadine soap and solution and sterilely  draped.  A pneumatic tourniquet was applied to the proximal brachium.   Following exsanguination of the right arm with ab Esmarch bandage, the  arterial tourniquet was inflated to 240 mmHg.   The procedure commenced with a 3-cm scission paralleling the path of the  ulnar nerve posterior to the medial  epicondyle.  The subcutaneous  tissues were meticulously dissected, identifying the posterior branch of  the medial antebrachial cutaneous nerve.  This was gently retracted.  The ulnar nerve was noted to be markedly swollen deep to the arcuate  ligament.  The nerve was identified proximal to the epicondyle and  dissection distally released the arcuate ligament and the Osborne band,  followed by decompression of the flexor carpi ulnaris fascia and use of  a Freer over a distance of 10 cm to release all fascial bands around the  ulnar nerve.  Proximally the brachial fascia was released overlying the  nerve over a distance of 7 cm.  There was no significant compression at  the arcade of Struthers.   The nerve had marked edema at the epicondyle.  The medial head of the  triceps was thickened and caused anterior roll of the nerve with elbow  flexion.   The medial head of the triceps was partially resected, as was the  tendinous aponeurosis overlying the nerve.   The nerve was stable with the tourniquet off through a range of 0-100  degrees flexion.  With extreme elbow flexion, the nerve did slide gently  over the epicondyle without snapping.   In my judgment, it is appropriate to leave the nerve in situ given the  circumstances.  After hemostasis was achieved, the tourniquet was  released and when hemostasis was assured, the wound was closed with  subdermal sutures of 4-0 Vicryl and intradermal 3-0 Prolene with Steri-  Strips.   A compressive dressing was applied with sterile gauze and a Tegaderm.  Ace bandage was applied for postoperative control of swelling.   Lidocaine 2% was infiltrated along the wound margins for postoperative  analgesia.   There were no apparent complications.   [name] tolerated the surgery and anesthesia well.  She was  transferred to the recovery room with stable signs.  She will be discharged to the care of her husband and grandchildren.  We  will  see her back in follow-up in the office in 1 week.  She is provided  a prescription for Dilaudid 2 mg one p.o. q.4-6h. p.r.n. pain.  She is  also advised to use Tylenol as needed for pain.      Katy Fitch Sypher, M.D.  Electronically Signed     RVS/MEDQ  D:  06/13/2006  T:  06/13/2006  Job:  [id number]   cc:   Santina Evans A. Orlin Hilding, M.D.  Scott A. Gerda Diss, MD

## 2010-07-06 ENCOUNTER — Ambulatory Visit (HOSPITAL_COMMUNITY)
Admission: RE | Admit: 2010-07-06 | Discharge: 2010-07-06 | Disposition: A | Discharge status: Home / Self Care | Payer: Medicare Other | Source: Ambulatory Visit | Attending: Family Medicine | Admitting: Family Medicine

## 2010-07-07 ENCOUNTER — Other Ambulatory Visit: Payer: Self-pay | Admitting: Cardiology

## 2010-07-07 ENCOUNTER — Ambulatory Visit (HOSPITAL_COMMUNITY)
Admission: RE | Admit: 2010-07-07 | Discharge: 2010-07-07 | Disposition: A | Discharge status: Home / Self Care | Payer: Medicare Other | Source: Ambulatory Visit | Attending: Family Medicine | Admitting: Family Medicine

## 2010-07-07 DIAGNOSIS — I6529 Occlusion and stenosis of unspecified carotid artery: Secondary | ICD-10-CM

## 2010-07-09 ENCOUNTER — Encounter: Payer: PRIVATE HEALTH INSURANCE | Admitting: *Deleted

## 2010-07-09 ENCOUNTER — Ambulatory Visit (HOSPITAL_COMMUNITY): Payer: PRIVATE HEALTH INSURANCE

## 2010-07-09 ENCOUNTER — Encounter (INDEPENDENT_AMBULATORY_CARE_PROVIDER_SITE_OTHER): Payer: Medicare Other | Admitting: Cardiology

## 2010-07-09 DIAGNOSIS — I6529 Occlusion and stenosis of unspecified carotid artery: Secondary | ICD-10-CM

## 2010-07-13 ENCOUNTER — Ambulatory Visit (HOSPITAL_COMMUNITY)
Admission: RE | Admit: 2010-07-13 | Discharge: 2010-07-13 | Disposition: A | Discharge status: Home / Self Care | Payer: Medicare Other | Source: Ambulatory Visit | Attending: Family Medicine | Admitting: Family Medicine

## 2010-07-14 ENCOUNTER — Encounter: Payer: Self-pay | Admitting: Cardiovascular Disease

## 2010-07-15 ENCOUNTER — Ambulatory Visit (HOSPITAL_COMMUNITY)
Admission: RE | Admit: 2010-07-15 | Discharge: 2010-07-15 | Disposition: A | Discharge status: Home / Self Care | Payer: Medicare Other | Source: Ambulatory Visit | Attending: Family Medicine | Admitting: Family Medicine

## 2010-07-15 ENCOUNTER — Ambulatory Visit (HOSPITAL_COMMUNITY): Payer: PRIVATE HEALTH INSURANCE

## 2010-07-19 ENCOUNTER — Ambulatory Visit (INDEPENDENT_AMBULATORY_CARE_PROVIDER_SITE_OTHER): Payer: Medicare Other | Admitting: Cardiovascular Disease

## 2010-07-19 ENCOUNTER — Encounter: Payer: Self-pay | Admitting: Cardiovascular Disease

## 2010-07-19 VITALS — BP 124/60 | HR 51 | Resp 18 | Ht 63.0 in | Wt 178.1 lb

## 2010-07-19 DIAGNOSIS — I10 Essential (primary) hypertension: Secondary | ICD-10-CM

## 2010-07-19 DIAGNOSIS — I2510 Atherosclerotic heart disease of native coronary artery without angina pectoris: Secondary | ICD-10-CM

## 2010-07-19 DIAGNOSIS — I6529 Occlusion and stenosis of unspecified carotid artery: Secondary | ICD-10-CM

## 2010-07-19 NOTE — Assessment & Plan Note (Signed)
Blood pressure is well-controlled on the patient's current medical program.

## 2010-07-19 NOTE — Assessment & Plan Note (Signed)
Carotid duplex scan from Jul 09, 2002 was reviewed. There was 60-79% bilateral ICA stenosis. This was stable from the patient's previous study. Recommend continued medical management.

## 2010-07-19 NOTE — Assessment & Plan Note (Signed)
The patient is stable without angina. Her EKG is unchanged. I advised that the end of July she can discontinue Plavix and she will be 12 months out from her stent placement at that point. She otherwise should continue her current medical program. Her episode yesterday sounded consistent with postural hypotension. She's had no other events and I recommended a period of watchful waiting. She was advised to push fluids and decrease caffeine intake. She was also advised to sit for about a minute after a prolonged period of lying down.

## 2010-07-19 NOTE — Patient Instructions (Signed)
You can stop Plavix at the end of July.  Your physician wants you to follow-up in: 6 months with Dr Excell Seltzer. Edsel Petrin 2012) You will receive a reminder letter in the mail two months in advance. If you don't receive a letter, please call our office to schedule the follow-up appointment.

## 2010-07-19 NOTE — Progress Notes (Signed)
HPI:  This is a 75 year old woman presenting for followup evaluation. The patient had a non-ST elevation MI in July 2011 after receiving I nerve block for chronic back pain. She underwent urgent cardiac catheterization demonstrated severe LAD stenosis and was treated with a drug-eluting stent. Her left ventricular ejection fraction was preserved at 75%. She underwent a followup Myoview scan December 2008 showing no significant ischemia. There was a fixed defect in the anterior wall with trivial peri-infarct ischemia. The patient has been managed medically.  She denies chest pain or shortness of breath. She had an episode yesterday when she first got up after lying on the couch for about an hour. Upon standing she felt weak and lightheaded. She also describes blurry vision associated with this. She had no localizing symptoms. She laid back down on the couch and symptoms resolved within 30-45 minutes. This was an isolated event she reports no other episodes of presyncope.  Outpatient Encounter Prescriptions as of 07/19/2010  Medication Sig Dispense Refill  . alendronate (FOSAMAX) 70 MG tablet Take 70 mg by mouth every 7 (seven) days. Take with a full glass of water on an empty stomach.       Marland Kitchen amLODipine (NORVASC) 5 MG tablet Take 5 mg by mouth daily.        Marland Kitchen aspirin 81 MG EC tablet Take 81 mg by mouth daily.        . citalopram (CELEXA) 40 MG tablet Take 40 mg by mouth daily.        . clopidogrel (PLAVIX) 75 MG tablet Take 75 mg by mouth daily.        Marland Kitchen donepezil (ARICEPT) 10 MG tablet Take 10 mg by mouth daily.        Marland Kitchen gabapentin (NEURONTIN) 100 MG capsule Take 100 mg by mouth 3 (three) times daily.        Marland Kitchen LORazepam (ATIVAN) 1 MG tablet Take 1 mg by mouth at bedtime.        . nitroGLYCERIN (NITROSTAT) 0.4 MG SL tablet Place 0.4 mg under the tongue as needed.        Marland Kitchen olmesartan (BENICAR) 40 MG tablet Take 40 mg by mouth daily.        . pravastatin (PRAVACHOL) 40 MG tablet Take 40 mg by mouth at  bedtime.        . ranitidine (ZANTAC) 150 MG tablet Take 150 mg by mouth 2 (two) times daily.          Allergies  Allergen Reactions  . Acetaminophen   . Sulfonamide Derivatives     Past Medical History  Diagnosis Date  . CAD (coronary artery disease) 08/2009    s/p PCI of the LAD  . Myocardial infarction     Non-st segment elevated  . Hypertension   . Hyperlipidemia   . Bradycardia   . Carotid artery stenosis   . COPD (chronic obstructive pulmonary disease)   . Chronic headache   . Asthma   . Pneumonia   . Dementia   . Ulnar neuropathy     ROS: Negative except as per HPI  BP 124/60  Pulse 51  Resp 18  Ht 5\' 3"  (1.6 m)  Wt 178 lb 1.9 oz (80.795 kg)  BMI 31.55 kg/m2  PHYSICAL EXAM: Pt is alert and oriented, elderly woman in NAD HEENT: normal Neck: JVP - normal, carotids 2+= without bruits Lungs: CTA bilaterally CV: RRR without murmur or gallop Abd: soft, NT, Positive BS, no hepatomegaly Ext: no C/C/E,  distal pulses intact and equal Skin: warm/dry no rash  EKG:  Sinus bradycardia 50 beats per minute, diffuse ST/T abnormalities consider anterolateral ischemia.  No significant change from previous tracing 01-06-2010.  ASSESSMENT AND PLAN:

## 2010-07-20 ENCOUNTER — Ambulatory Visit (HOSPITAL_COMMUNITY)
Admission: RE | Admit: 2010-07-20 | Discharge: 2010-07-20 | Disposition: A | Discharge status: Home / Self Care | Payer: Medicare Other | Source: Ambulatory Visit | Attending: Family Medicine | Admitting: Family Medicine

## 2010-07-20 DIAGNOSIS — M545 Low back pain, unspecified: Secondary | ICD-10-CM | POA: Insufficient documentation

## 2010-07-20 DIAGNOSIS — IMO0001 Reserved for inherently not codable concepts without codable children: Secondary | ICD-10-CM | POA: Insufficient documentation

## 2010-07-20 DIAGNOSIS — M6281 Muscle weakness (generalized): Secondary | ICD-10-CM | POA: Insufficient documentation

## 2010-07-20 DIAGNOSIS — R262 Difficulty in walking, not elsewhere classified: Secondary | ICD-10-CM | POA: Insufficient documentation

## 2010-07-22 ENCOUNTER — Ambulatory Visit (HOSPITAL_COMMUNITY): Payer: PRIVATE HEALTH INSURANCE | Admitting: *Deleted

## 2010-07-22 ENCOUNTER — Ambulatory Visit (HOSPITAL_COMMUNITY): Payer: PRIVATE HEALTH INSURANCE | Admitting: Physical Therapy

## 2010-07-22 ENCOUNTER — Ambulatory Visit (HOSPITAL_COMMUNITY)
Admission: RE | Admit: 2010-07-22 | Discharge: 2010-07-22 | Disposition: A | Discharge status: Home / Self Care | Payer: Medicare Other | Source: Ambulatory Visit | Attending: Family Medicine | Admitting: Family Medicine

## 2010-07-27 ENCOUNTER — Ambulatory Visit (HOSPITAL_COMMUNITY): Payer: PRIVATE HEALTH INSURANCE | Admitting: Physical Therapy

## 2010-07-29 ENCOUNTER — Ambulatory Visit (HOSPITAL_COMMUNITY): Payer: PRIVATE HEALTH INSURANCE

## 2010-07-29 ENCOUNTER — Ambulatory Visit (HOSPITAL_COMMUNITY)
Admission: RE | Admit: 2010-07-29 | Discharge: 2010-07-29 | Disposition: A | Discharge status: Home / Self Care | Payer: Medicare Other | Source: Ambulatory Visit | Attending: Family Medicine | Admitting: Family Medicine

## 2010-10-12 ENCOUNTER — Encounter: Payer: Self-pay | Admitting: Internal Medicine

## 2010-10-14 ENCOUNTER — Ambulatory Visit (INDEPENDENT_AMBULATORY_CARE_PROVIDER_SITE_OTHER): Payer: Medicare Other | Admitting: Cardiovascular Disease

## 2010-10-14 ENCOUNTER — Encounter: Payer: Self-pay | Admitting: Cardiovascular Disease

## 2010-10-14 DIAGNOSIS — I5031 Acute diastolic (congestive) heart failure: Secondary | ICD-10-CM

## 2010-10-14 DIAGNOSIS — I5033 Acute on chronic diastolic (congestive) heart failure: Secondary | ICD-10-CM

## 2010-10-14 DIAGNOSIS — I2510 Atherosclerotic heart disease of native coronary artery without angina pectoris: Secondary | ICD-10-CM

## 2010-10-14 MED ORDER — POTASSIUM CHLORIDE ER 10 MEQ PO TBCR: EXTENDED_RELEASE_TABLET | ORAL | Status: DC

## 2010-10-14 MED ORDER — FUROSEMIDE 40 MG PO TABS: 40.0000 mg | ORAL_TABLET | Freq: Every day | ORAL | Status: DC

## 2010-10-14 NOTE — Patient Instructions (Signed)
Your physician wants you to follow-up in: 6 months. You will receive a reminder letter in the mail two months in advance. If you don't receive a letter, please call our office to schedule the follow-up appointment.  Your physician has requested that you have a lexiscan myoview. For further information please visit [email or web address] Please follow instruction sheet, as given.   Your physician has recommended you make the following change in your medication: Start furosemide 40 mg by mouth daily. Start k-dur 10 meq by mouth daily.

## 2010-10-15 ENCOUNTER — Encounter: Payer: Self-pay | Admitting: Cardiovascular Disease

## 2010-10-15 DIAGNOSIS — I5031 Acute diastolic (congestive) heart failure: Secondary | ICD-10-CM | POA: Insufficient documentation

## 2010-10-15 NOTE — Progress Notes (Signed)
HPI:  This is a 75 year old woman presenting for followup evaluation. The patient has coronary artery disease and presented with non-ST elevation myocardial infarction in July 2011. She was found to have severe LAD stenosis and was treated with a drug-eluting stent. She had minimal coronary disease elsewhere. Her left ventricular ejection fraction was preserved at 75%. She was seen here in June and was doing well at that time. However, over the last several weeks she has developed progressive dyspnea and edema. She describes a constant chest pressure worse with exertion. She also complains of mild orthopnea without PND. She bought shoes last month or originally were a little too big and now they are tight on her feet.  Outpatient Encounter Prescriptions as of 10/14/2010  Medication Sig Dispense Refill  . alendronate (FOSAMAX) 70 MG tablet Take 70 mg by mouth every 7 (seven) days. Take with a full glass of water on an empty stomach.       . Alpha-D-Galactosidase (BEANO PO) Take by mouth as needed.        Marland Kitchen amLODipine (NORVASC) 5 MG tablet Take 5 mg by mouth daily.        Marland Kitchen aspirin 81 MG EC tablet Take 81 mg by mouth daily.        . Cetirizine HCl (ZYRTEC ALLERGY PO) Take by mouth as needed.        . citalopram (CELEXA) 40 MG tablet Take 40 mg by mouth daily.        Marland Kitchen donepezil (ARICEPT) 10 MG tablet Take 10 mg by mouth daily.        Marland Kitchen Fexofenadine HCl (ALLEGRA PO) Take by mouth as needed.        . fluticasone (FLONASE) 50 MCG/ACT nasal spray Place 2 sprays into the nose daily.        . IBUPROFEN PO Take by mouth as needed.        . Lansoprazole (PREVACID PO) Take by mouth as needed.        Marland Kitchen LORazepam (ATIVAN) 1 MG tablet Take 1 mg by mouth at bedtime.        . nitroGLYCERIN (NITROSTAT) 0.4 MG SL tablet Place 0.4 mg under the tongue as needed.        Marland Kitchen olmesartan (BENICAR) 40 MG tablet Take 40 mg by mouth daily.        . pravastatin (PRAVACHOL) 40 MG tablet Take 40 mg by mouth at bedtime.        .  furosemide (LASIX) 40 MG tablet Take 1 tablet (40 mg total) by mouth daily.  30 tablet  11  . potassium chloride (K-DUR) 10 MEQ tablet Take one tablet by mouth daily  30 tablet  11  . DISCONTD: clopidogrel (PLAVIX) 75 MG tablet Take 75 mg by mouth daily.        Marland Kitchen DISCONTD: gabapentin (NEURONTIN) 100 MG capsule Take 100 mg by mouth 3 (three) times daily.        Marland Kitchen DISCONTD: ranitidine (ZANTAC) 150 MG tablet Take 150 mg by mouth 2 (two) times daily.          Allergies  Allergen Reactions  . Acetaminophen   . Sulfonamide Derivatives     Past Medical History  Diagnosis Date  . CAD (coronary artery disease) 08/2009    s/p PCI of the LAD  . Myocardial infarction     Non-st segment elevated  . Hypertension   . Hyperlipidemia   . Bradycardia   . Carotid artery stenosis   .  COPD (chronic obstructive pulmonary disease)   . Chronic headache   . Asthma   . Pneumonia   . Dementia   . Ulnar neuropathy     ROS: Negative except as per HPI  BP 160/62  Ht 5\' 3"  (1.6 m)  Wt 188 lb (85.276 kg)  BMI 33.30 kg/m2  PHYSICAL EXAM: Pt is alert and oriented, elderly woman in NAD HEENT: normal Neck: JVP - normal, carotids 2+= with bilateral bruits Lungs: CTA bilaterally CV: RRR without murmur or gallop Abd: soft, NT, Positive BS, no hepatomegaly Ext: 1+ bilateral pedal edema, distal pulses intact and equal Skin: warm/dry no rash  EKG:  Sinus bradycardia 57 beats per minute, T wave abnormality consider lateral ischemia.  ASSESSMENT AND PLAN:

## 2010-10-15 NOTE — Assessment & Plan Note (Signed)
The patient is discontinued Plavix after 12 months of therapy following drug-eluting stent implantation. She is having angina and I'm not sure if this is related to high filling pressures in the setting of congestive heart failure or whether it is due to recurrent coronary obstructive disease. Plan diuresis to reduce filling pressures and nuclear stress test to evaluate for ischemia. If symptoms worsen or if stress test is abnormal, she will require cardiac catheterization.

## 2010-10-15 NOTE — Assessment & Plan Note (Signed)
The patient has clinical signs and symptoms of congestive heart failure. This is likely diastolic heart failure she had preserved LV function in the past. It's possible that ischemia is contributing to her symptoms as she is having chest pressure. In reviewing her medication list, she is taking Benicar for treatment of hypertension which provides afterload reduction. She is not on a beta blocker because of bradycardia. I am going to start her on furosemide 40 mg daily with potassium chloride 10 mEq daily. I am hopeful that she will have rapid improvement in her symptoms with diuresis. She will have a followup metabolic panel next week.

## 2010-10-26 ENCOUNTER — Ambulatory Visit (HOSPITAL_COMMUNITY): Payer: Medicare Other | Attending: Cardiovascular Disease | Admitting: Radiology

## 2010-10-26 VITALS — Ht 63.0 in | Wt 181.0 lb

## 2010-10-26 DIAGNOSIS — R0609 Other forms of dyspnea: Secondary | ICD-10-CM

## 2010-10-26 DIAGNOSIS — I4949 Other premature depolarization: Secondary | ICD-10-CM

## 2010-10-26 DIAGNOSIS — R0602 Shortness of breath: Secondary | ICD-10-CM

## 2010-10-26 DIAGNOSIS — I2510 Atherosclerotic heart disease of native coronary artery without angina pectoris: Secondary | ICD-10-CM

## 2010-10-26 DIAGNOSIS — R0789 Other chest pain: Secondary | ICD-10-CM

## 2010-10-26 DIAGNOSIS — R9431 Abnormal electrocardiogram [ECG] [EKG]: Secondary | ICD-10-CM

## 2010-10-26 MED ORDER — TECHNETIUM TC 99M TETROFOSMIN IV KIT
33.0000 | PACK | Freq: Once | INTRAVENOUS | Status: AC | PRN
  Administered 2010-10-26: 33 via INTRAVENOUS

## 2010-10-26 MED ORDER — TECHNETIUM TC 99M TETROFOSMIN IV KIT
11.0000 | PACK | Freq: Once | INTRAVENOUS | Status: AC | PRN
  Administered 2010-10-26: 11 via INTRAVENOUS

## 2010-10-26 MED ORDER — REGADENOSON 0.4 MG/5ML IV SOLN
0.4000 mg | Freq: Once | INTRAVENOUS | Status: AC
  Administered 2010-10-26: 0.4 mg via INTRAVENOUS

## 2010-10-26 NOTE — Progress Notes (Signed)
[facility] SITE 3 NUCLEAR MED [address] [place] [address] [phone]  Cardiology Nuclear Med Study  [name] is a 75 y.o. female [id number] Nov 10, 1935   Nuclear Med Background Indication for Stress Test:  Evaluation for Ischemia and Stent Patency History:  Abnormal EKG, Angioplasty, Asthma, COPD, [id number] Echo: EF 65-70%, 07/11 Heart Catheterization: EF 75%, 07/11 Myocardial Infarction: NSTEMI, 01/21/10 Myocardial Perfusion Study: EF: 57% previous anterior infarct with trivial ischemia peri infarct and 07/11Stents: LAD Cardiac Risk Factors: Carotid Disease, Hypertension and Lipids  Symptoms:  Chest Pain, Chest Pressure, DOE and SOB   Nuclear Pre-Procedure Caffeine/Decaff Intake:  None NPO After: 8:00pm   Lungs:  clear IV 0.9% NS with Angio Cath:  20g  IV Site: R Hand  IV Started by:  Cathlyn Parsons, RN  Chest Size (in):  38 Cup Size: C  Height: 5\' 3"  (1.6 m)  Weight:  181 lb (82.101 kg)  BMI:  Body mass index is 32.06 kg/(m^2). Tech Comments:  NA    Nuclear Med Study 1 or 2 day study: 1 day  Stress Test Type:  Eugenie Birks  Reading MD: Olga Millers, MD  Order Authorizing Provider:  M.Cooper  Resting Radionuclide: Technetium 58m Tetrofosmin  Resting Radionuclide Dose: 11.0 mCi   Stress Radionuclide:  Technetium 8m Tetrofosmin  Stress Radionuclide Dose: 33.0 mCi           Stress Protocol Rest HR: 50 Stress HR: 73  Rest BP: 119/49 Stress BP: 140/44  Exercise Time (min): n/a METS: n/a   Predicted Max HR: 148 bpm % Max HR: 49.32 bpm Rate Pressure Product: 63875   Dose of Adenosine (mg):  n/a Dose of Lexiscan: 0.4 mg  Dose of Atropine (mg): n/a Dose of Dobutamine: n/a mcg/kg/min (at max HR)  Stress Test Technologist: Milana Na, EMT-P  Nuclear Technologist:  Domenic Polite, CNMT     Rest Procedure:  Myocardial perfusion imaging was performed at rest 45 minutes following the intravenous administration of Technetium 14m  Tetrofosmin. Rest ECG: Sinus Bradycardia with v-cuplets and ST,T changes  Stress Procedure:  The patient received IV Lexiscan 0.4 mg over 15-seconds.  Technetium 78m Tetrofosmin injected at 30-seconds.  There were non specific changes and rare pvcs/pacs with Lexiscan.  Quantitative spect images were obtained after a 45 minute delay. Stress ECG: No significant change from baseline ECG  QPS Raw Data Images:  Acquisition technically good; normal left ventricular size. Stress Images:  There is decreased uptake in the anterior wall. Rest Images:  Normal homogeneous uptake in all areas of the myocardium. Subtraction (SDS):  Very mild anterior ischemia. Transient Ischemic Dilatation (Normal <1.22):  1.19 Lung/Heart Ratio (Normal <0.45):  0.20  Quantitative Gated Spect Images QGS EDV:  95 ml QGS ESV:  44 ml QGS cine images:  NL LV Function; NL Wall Motion QGS EF: 53%  Impression Exercise Capacity:  Lexiscan with no exercise. BP Response:  Normal blood pressure response. Clinical Symptoms:  There is chest pain. ECG Impression:  No significant ST segment change suggestive of ischemia. Comparison with Prior Nuclear Study: No significant change from previous study  Overall Impression:  Abnormal stress nuclear study with very mild anterior ischemia.  Olga Millers

## 2010-11-08 LAB — URINALYSIS, ROUTINE W REFLEX MICROSCOPIC
Glucose, UA: NEGATIVE
Hgb urine dipstick: NEGATIVE
Leukocytes, UA: NEGATIVE
Nitrite: NEGATIVE
Protein, ur: 100 — AB
Specific Gravity, Urine: >1.03 — ABNORMAL HIGH
Urobilinogen, UA: 0.2
pH: 5

## 2010-11-08 LAB — DIFFERENTIAL
Basophils Absolute: 0
Basophils Absolute: 0
Basophils Absolute: 0
Basophils Absolute: 0
Basophils Relative: 0
Basophils Relative: 0
Basophils Relative: 0
Eosinophils Absolute: 0
Eosinophils Absolute: 0.1
Eosinophils Relative: 0
Eosinophils Relative: 0
Lymphocytes Relative: 3 — ABNORMAL LOW
Lymphocytes Relative: 6 — ABNORMAL LOW
Lymphocytes Relative: 7 — ABNORMAL LOW
Lymphs Abs: 0.4 — ABNORMAL LOW
Lymphs Abs: 0.6 — ABNORMAL LOW
Monocytes Absolute: 0.1
Monocytes Absolute: 0.4
Monocytes Absolute: 0.5
Monocytes Relative: 2 — ABNORMAL LOW
Neutro Abs: 14.8 — ABNORMAL HIGH
Neutro Abs: 18.2 — ABNORMAL HIGH
Neutrophils Relative %: 90 — ABNORMAL HIGH
Neutrophils Relative %: 95 — ABNORMAL HIGH

## 2010-11-08 LAB — BASIC METABOLIC PANEL
BUN: 10
BUN: 37 — ABNORMAL HIGH
BUN: 37 — ABNORMAL HIGH
CO2: 24
CO2: 25
Calcium: 7.6 — ABNORMAL LOW
Calcium: 8.1 — ABNORMAL LOW
Chloride: 104
Chloride: 105
Chloride: 106
Chloride: 94 — ABNORMAL LOW
Creatinine, Ser: 0.66
GFR calc Af Amer: 22 — ABNORMAL LOW
GFR calc Af Amer: 40 — ABNORMAL LOW
GFR calc Af Amer: >60
GFR calc Af Amer: >60
GFR calc non Af Amer: 18 — ABNORMAL LOW
GFR calc non Af Amer: 24 — ABNORMAL LOW
GFR calc non Af Amer: >60
Glucose, Bld: 118 — ABNORMAL HIGH
Glucose, Bld: 150 — ABNORMAL HIGH
Glucose, Bld: 188 — ABNORMAL HIGH
Glucose, Bld: 210 — ABNORMAL HIGH
Potassium: 3.3 — ABNORMAL LOW
Potassium: 3.5
Potassium: 4.1
Sodium: 134 — ABNORMAL LOW
Sodium: 135
Sodium: 136
Sodium: 138

## 2010-11-08 LAB — CULTURE, RESPIRATORY W GRAM STAIN
Culture: NORMAL
Gram Stain: NONE SEEN

## 2010-11-08 LAB — CBC
HCT: 29.3 — ABNORMAL LOW
Hemoglobin: 10 — ABNORMAL LOW
Hemoglobin: 9.8 — ABNORMAL LOW
MCHC: 34.2
MCV: 81.6
Platelets: 158
Platelets: 162
RDW: 15.6 — ABNORMAL HIGH
RDW: 15.7 — ABNORMAL HIGH
RDW: 15.9 — ABNORMAL HIGH
RDW: 15.9 — ABNORMAL HIGH
WBC: 16.6 — ABNORMAL HIGH
WBC: 19.2 — ABNORMAL HIGH
WBC: 5.9

## 2010-11-08 LAB — URINE CULTURE
Colony Count: NO GROWTH
Culture: NO GROWTH

## 2010-11-08 LAB — EXPECTORATED SPUTUM ASSESSMENT W GRAM STAIN, RFLX TO RESP C

## 2010-11-08 LAB — URINE MICROSCOPIC-ADD ON

## 2010-11-09 ENCOUNTER — Ambulatory Visit (INDEPENDENT_AMBULATORY_CARE_PROVIDER_SITE_OTHER): Payer: Medicare Other | Admitting: Cardiovascular Disease

## 2010-11-09 ENCOUNTER — Encounter: Payer: Self-pay | Admitting: Cardiovascular Disease

## 2010-11-09 ENCOUNTER — Other Ambulatory Visit: Payer: Self-pay | Admitting: Cardiovascular Disease

## 2010-11-09 VITALS — BP 116/50 | HR 55 | Resp 20 | Ht 63.0 in | Wt 183.0 lb

## 2010-11-09 DIAGNOSIS — E785 Hyperlipidemia, unspecified: Secondary | ICD-10-CM | POA: Insufficient documentation

## 2010-11-09 DIAGNOSIS — I2510 Atherosclerotic heart disease of native coronary artery without angina pectoris: Secondary | ICD-10-CM

## 2010-11-09 DIAGNOSIS — I10 Essential (primary) hypertension: Secondary | ICD-10-CM

## 2010-11-09 LAB — BASIC METABOLIC PANEL
CO2: 27 mEq/L (ref 19–32)
Chloride: 106 mEq/L (ref 96–112)
Glucose, Bld: 88 mg/dL (ref 70–99)
Potassium: 5.6 mEq/L — ABNORMAL HIGH (ref 3.5–5.1)
Sodium: 143 mEq/L (ref 135–145)

## 2010-11-09 LAB — CBC WITH DIFFERENTIAL/PLATELET
Basophils Absolute: 0.1 10*3/uL (ref 0.0–0.1)
Basophils Relative: 1 % (ref 0.0–3.0)
Eosinophils Absolute: 0.2 10*3/uL (ref 0.0–0.7)
HCT: 36.5 % (ref 36.0–46.0)
Hemoglobin: 12 g/dL (ref 12.0–15.0)
Lymphs Abs: 1.5 10*3/uL (ref 0.7–4.0)
MCHC: 32.8 g/dL (ref 30.0–36.0)
Monocytes Relative: 7.3 % (ref 3.0–12.0)
Neutro Abs: 3.7 10*3/uL (ref 1.4–7.7)
RBC: 4.18 Mil/uL (ref 3.87–5.11)
RDW: 14.2 % (ref 11.5–14.6)

## 2010-11-09 NOTE — Progress Notes (Signed)
Addended by: Early Chars on: 11/09/2010 05:14 PM   Modules accepted: Orders

## 2010-11-09 NOTE — Assessment & Plan Note (Signed)
Lipids have been elevated in the past, but she is taking pravastatin now. She is followed by her primary care physician. Goal LDL is less than 100 mg per deciliter.

## 2010-11-09 NOTE — Progress Notes (Signed)
HPI:  This is a 75 year old woman presented for followup evaluation.  The patient has coronary artery disease and presented with non-ST elevation infarction over one year ago. She was found to have critical stenosis of the LAD and was treated with a drug-eluting stent. She initially did well but at the time of her recent visit she complained of progressive dyspnea with exertion, chest pressure, and leg swelling. She was treated with diuretics and underwent a Lexi scan stress Myoview study. This demonstrated mild anterior wall ischemia. She returns today for further review.  She reports improvement in her leg swelling since he was last seen here. However, she continues to have marked shortness of breath with exertion. Her symptoms have progressed over the last 6 months. The patient has COPD but she quit smoking 5 years ago and her symptoms had previously been stable. She denies orthopnea, PND, or palpitations. She has occasional chest pain described as a pressure across the front of the chest. This generally occurs only when she is short of breath. She reports compliance with her medications.  Outpatient Encounter Prescriptions as of 11/09/2010  Medication Sig Dispense Refill  . alendronate (FOSAMAX) 70 MG tablet Take 70 mg by mouth every 7 (seven) days. Take with a full glass of water on an empty stomach.       . Alpha-D-Galactosidase (BEANO PO) Take by mouth as needed.        Marland Kitchen amLODipine (NORVASC) 5 MG tablet Take 5 mg by mouth daily.        Marland Kitchen aspirin 81 MG EC tablet Take 81 mg by mouth daily.        . Cetirizine HCl (ZYRTEC ALLERGY PO) Take by mouth as needed.        . citalopram (CELEXA) 40 MG tablet Take 40 mg by mouth daily.        Marland Kitchen donepezil (ARICEPT) 10 MG tablet Take 10 mg by mouth daily.        Marland Kitchen Fexofenadine HCl (ALLEGRA PO) Take by mouth as needed.        . fluticasone (FLONASE) 50 MCG/ACT nasal spray Place 2 sprays into the nose daily.        . furosemide (LASIX) 40 MG tablet Take 1  tablet (40 mg total) by mouth daily.  30 tablet  11  . IBUPROFEN PO Take by mouth as needed.        . Lansoprazole (PREVACID PO) Take by mouth as needed.        Marland Kitchen LORazepam (ATIVAN) 1 MG tablet Take 1 mg by mouth at bedtime.        . nitroGLYCERIN (NITROSTAT) 0.4 MG SL tablet Place 0.4 mg under the tongue as needed.        Marland Kitchen olmesartan (BENICAR) 40 MG tablet Take 40 mg by mouth daily.        . potassium chloride (K-DUR) 10 MEQ tablet Take one tablet by mouth daily  30 tablet  11  . pravastatin (PRAVACHOL) 40 MG tablet Take 40 mg by mouth at bedtime.          Allergies  Allergen Reactions  . Acetaminophen   . Sulfonamide Derivatives     Past Medical History  Diagnosis Date  . CAD (coronary artery disease) 08/2009    s/p PCI of the LAD  . Myocardial infarction     Non-st segment elevated  . Hypertension   . Hyperlipidemia   . Bradycardia   . Carotid artery stenosis   . COPD (  chronic obstructive pulmonary disease)   . Chronic headache   . Asthma   . Pneumonia   . Dementia   . Ulnar neuropathy     ROS: Negative except as per HPI  BP 116/50  Pulse 55  Resp 20  Ht 5\' 3"  (1.6 m)  Wt 183 lb (83.008 kg)  BMI 32.42 kg/m2  PHYSICAL EXAM: Pt is alert and oriented, NAD HEENT: normal Neck: JVP - normal, carotids 2+= without bruits Lungs: CTA bilaterally CV: RRR without murmur or gallop Abd: soft, NT, Positive BS, Obese Ext: no C/C/E, distal pulses intact and equal Skin: warm/dry no rash  ASSESSMENT AND PLAN:

## 2010-11-09 NOTE — Assessment & Plan Note (Signed)
The patient has progressive dyspnea. I reviewed both the report in the images from her Myoview stress test today. In the setting of mild anterior ischemia that correlates with the territory of her prior PCI, I think we should proceed with repeat cardiac catheterization. It is certainly possible that her exertional dyspnea is her anginal equivalent. I discussed this with the patient and her daughters at length. Risks, benefits, and alternatives to diagnostic catheterization plus or minus percutaneous coronary intervention were reviewed with the patient and her family. They understand and agree to proceed. She'll continue on the same medical program in the interim.

## 2010-11-09 NOTE — Patient Instructions (Addendum)
Your physician has requested that you have a cardiac catheterization. Cardiac catheterization is used to diagnose and/or treat various heart conditions. Doctors may recommend this procedure for a number of different reasons. The most common reason is to evaluate chest pain. Chest pain can be a symptom of coronary artery disease (CAD), and cardiac catheterization can show whether plaque is narrowing or blocking your heart's arteries. This procedure is also used to evaluate the valves, as well as measure the blood flow and oxygen levels in different parts of your heart. For further information please visit [email or web address] Please follow instruction sheet, as given.  Your physician recommends that you continue on your current medications as directed. Please refer to the Current Medication list given to you today.  Your physician recommends that you have lab work today: BMP, CBC, PT/INR  Your physician recommends that you schedule a follow-up appointment in: 4 WEEKS

## 2010-11-09 NOTE — Assessment & Plan Note (Signed)
Blood pressure is well-controlled. Edema has resolved since starting furosemide. Will continue current management.

## 2010-11-10 ENCOUNTER — Telehealth: Payer: Self-pay | Admitting: Cardiovascular Disease

## 2010-11-10 NOTE — Telephone Encounter (Signed)
Pt returning call from Lauren from yesterday. Please return call to discuss further.

## 2010-11-10 NOTE — Telephone Encounter (Signed)
I spoke with [name] and made her aware of Dr Earmon Phoenix recommendations based on lab results.  The pt will hold her potassium at this time. The pt will also hold Benicar on 10/1 and 10/2. The pt will have a repeat BMP drawn on either 10/4 or 10/5 in [place].  Order mailed to [facility] at [address] [place] [address].

## 2010-11-15 HISTORY — PX: CARDIAC CATHETERIZATION: SHX172

## 2010-11-16 ENCOUNTER — Inpatient Hospital Stay (HOSPITAL_COMMUNITY)
Admission: RE | Admit: 2010-11-16 | Discharge: 2010-11-16 | Disposition: A | Discharge status: Home / Self Care | Payer: Medicare Other | Source: Ambulatory Visit | Attending: Cardiovascular Disease | Admitting: Cardiovascular Disease

## 2010-11-16 DIAGNOSIS — I2510 Atherosclerotic heart disease of native coronary artery without angina pectoris: Secondary | ICD-10-CM

## 2010-11-16 DIAGNOSIS — R0989 Other specified symptoms and signs involving the circulatory and respiratory systems: Secondary | ICD-10-CM | POA: Insufficient documentation

## 2010-11-16 DIAGNOSIS — R0609 Other forms of dyspnea: Secondary | ICD-10-CM | POA: Insufficient documentation

## 2010-11-17 NOTE — Cardiovascular Report (Signed)
  [name], [name]              ACCOUNT NO.:  [id number]  MEDICAL RECORD NO.:  [id number]  LOCATION:                                 FACILITY:  PHYSICIAN:  Veverly Fells. Excell Seltzer, MD  DATE OF BIRTH:  10/28/1935  DATE OF PROCEDURE:  11/16/2010 DATE OF DISCHARGE:                           CARDIAC CATHETERIZATION   PROCEDURES: 1. Left heart catheterization. 2. Selective coronary angiography.  PROCEDURAL INDICATIONS:  [name] is a 75 year old woman with coronary artery disease.  She underwent stenting of the LAD little over 1 year ago after presenting with acute coronary syndrome.  A drug- eluting stent was used.  She has presented with progressive exertional dyspnea and chest pressure.  A Myoview scan showed mild anterior ischemia.  She was referred for cardiac cath.  Risks and indications of procedure reviewed with the patient and informed consent was obtained.  The right groin was prepped, draped and anesthetized with 1% lidocaine.  Using modified Seldinger technique, a 4- French sheath was placed in the right femoral artery.  Standard Judkins catheters were used for coronary angiography.  A pigtail catheter was used to record left ventricular pressure.  The patient tolerated the procedure well.  There were no immediate complications.  PROCEDURAL FINDINGS:  Aortic pressure 135/48 with a mean of 79, left ventricular pressure 145/20.  CORONARY ANGIOGRAPHY:  The left mainstem is widely patent.  It divides into the LAD and left circumflex.  LAD:  The LAD is patent to the left ventricular apex.  The vessel has mild nonobstructive plaque at the proximal and midportions with 20% stenosis proximally and 30% stenosis in the mid-LAD just before a stented segment.  The stent is widely patent with no evidence of in- stent restenosis.  The vessel then course down with no further significant obstructive disease in the mid or distal LAD.  There are two diagonal branches from the  proximal and mid LAD, which are widely patent.  Left circumflex:  The left circumflex is a large common dominant vessel. The circumflex supplies two obtuse marginal branches, a left posterolateral branch, and the left PDA branch.  There is no significant obstructive disease of the left circumflex distribution.  Right coronary artery:  The RCA is a small, nondominant vessel.  There is 30-40% mid-RCA stenosis.  FINAL ASSESSMENT: 1. Diffuse nonobstructive coronary artery disease as above. 2. Widely patent mid-left anterior descending artery stent.  RECOMMENDATIONS:  The patient will continue with medical therapy and lifestyle modification.     Veverly Fells. Excell Seltzer, MD     MDC/MEDQ  D:  11/16/2010  T:  11/16/2010  Job:  [id number]  cc:   Lorin Picket A. Gerda Diss, MD  Electronically Signed by Tonny Bollman MD on 11/17/2010 10:34:18 PM

## 2010-11-19 ENCOUNTER — Telehealth: Payer: Self-pay | Admitting: Cardiovascular Disease

## 2010-11-22 ENCOUNTER — Other Ambulatory Visit: Payer: Self-pay | Admitting: Cardiovascular Disease

## 2010-11-22 LAB — BASIC METABOLIC PANEL
BUN: 25 mg/dL — ABNORMAL HIGH (ref 6–23)
CO2: 24 mEq/L (ref 19–32)
Glucose, Bld: 112 mg/dL — ABNORMAL HIGH (ref 70–99)
Potassium: 4.7 mEq/L (ref 3.5–5.3)
Sodium: 141 mEq/L (ref 135–145)

## 2010-12-06 ENCOUNTER — Telehealth: Payer: Self-pay | Admitting: Cardiovascular Disease

## 2010-12-06 NOTE — Telephone Encounter (Signed)
please call back about test results

## 2010-12-06 NOTE — Telephone Encounter (Signed)
Stacey aware of lab results.

## 2010-12-09 ENCOUNTER — Encounter: Payer: Self-pay | Admitting: Cardiovascular Disease

## 2010-12-09 ENCOUNTER — Ambulatory Visit (INDEPENDENT_AMBULATORY_CARE_PROVIDER_SITE_OTHER): Payer: Medicare Other | Admitting: Cardiovascular Disease

## 2010-12-09 DIAGNOSIS — I6529 Occlusion and stenosis of unspecified carotid artery: Secondary | ICD-10-CM

## 2010-12-09 DIAGNOSIS — I2510 Atherosclerotic heart disease of native coronary artery without angina pectoris: Secondary | ICD-10-CM

## 2010-12-09 DIAGNOSIS — I10 Essential (primary) hypertension: Secondary | ICD-10-CM

## 2010-12-09 NOTE — Patient Instructions (Signed)
Your physician has requested that you have a carotid duplex in 1 YEAR. This test is an ultrasound of the carotid arteries in your neck. It looks at blood flow through these arteries that supply the brain with blood. Allow one hour for this exam. There are no restrictions or special instructions.  Your physician wants you to follow-up in: 1 YEAR.  You will receive a reminder letter in the mail two months in advance. If you don't receive a letter, please call our office to schedule the follow-up appointment.  Your physician recommends that you continue on your current medications as directed. Please refer to the Current Medication list given to you today.

## 2010-12-12 ENCOUNTER — Encounter: Payer: Self-pay | Admitting: Cardiovascular Disease

## 2010-12-12 NOTE — Assessment & Plan Note (Signed)
Blood pressure is well controlled on benazepril and amlodipine.  Discussed salt restriction and dietary changes.

## 2010-12-12 NOTE — Progress Notes (Signed)
HPI:  This is a 75 year old woman presenting for followup evaluation. The patient has coronary artery disease and underwent stenting of the LAD last year. She has complained of progressive dyspnea with exertion and some exertional chest pain. She was evaluated with repeat cardiac catheterization demonstrating patency of her LAD stent and nonobstructive disease elsewhere. Her left ventricular function has been preserved. Following her catheterization, we suspected weight gain has contributed to her symptoms. She has focused on making dietary changes and has lost weight since her last office visit.  She feels better he notes that breathing is improved. She denies chest pain or pressure. She denies edema or other complaints.  Outpatient Encounter Prescriptions as of 12/09/2010  Medication Sig Dispense Refill  . alendronate (FOSAMAX) 70 MG tablet Take 70 mg by mouth every 7 (seven) days. Take with a full glass of water on an empty stomach.       . Alpha-D-Galactosidase (BEANO PO) Take by mouth as needed.        Marland Kitchen amLODipine (NORVASC) 5 MG tablet Take 5 mg by mouth daily.        Marland Kitchen aspirin 81 MG EC tablet Take 81 mg by mouth daily.        . Cetirizine HCl (ZYRTEC ALLERGY PO) Take by mouth as needed.        . citalopram (CELEXA) 40 MG tablet Take 40 mg by mouth daily.        Marland Kitchen donepezil (ARICEPT) 10 MG tablet Take 10 mg by mouth daily.        Marland Kitchen Fexofenadine HCl (ALLEGRA PO) Take by mouth as needed.        . fluticasone (FLONASE) 50 MCG/ACT nasal spray Place 2 sprays into the nose daily.        . furosemide (LASIX) 40 MG tablet Take 1 tablet (40 mg total) by mouth daily.  30 tablet  11  . IBUPROFEN PO Take by mouth as needed.        . Lansoprazole (PREVACID PO) Take by mouth as needed.        Marland Kitchen LORazepam (ATIVAN) 1 MG tablet Take 1 mg by mouth at bedtime.        . nitroGLYCERIN (NITROSTAT) 0.4 MG SL tablet Place 0.4 mg under the tongue as needed.        Marland Kitchen olmesartan (BENICAR) 40 MG tablet Take 40 mg by  mouth daily.        . pravastatin (PRAVACHOL) 40 MG tablet Take 40 mg by mouth at bedtime.        Marland Kitchen DISCONTD: potassium chloride (K-DUR) 10 MEQ tablet Take one tablet by mouth daily  30 tablet  11    Allergies  Allergen Reactions  . Acetaminophen   . Sulfonamide Derivatives     Past Medical History  Diagnosis Date  . CAD (coronary artery disease) 08/2009    s/p PCI of the LAD  . Myocardial infarction     Non-st segment elevated  . Hypertension   . Hyperlipidemia   . Bradycardia   . Carotid artery stenosis   . COPD (chronic obstructive pulmonary disease)   . Chronic headache   . Asthma   . Pneumonia   . Dementia   . Ulnar neuropathy     BP 120/58  Pulse 56  Resp 18  Ht 5\' 3"  (1.6 m)  Wt 181 lb 12.8 oz (82.464 kg)  BMI 32.20 kg/m2  PHYSICAL EXAM: Pt is alert and oriented, overweight woman in NAD HEENT: normal  Neck: JVP - normal, carotids 2+= without bruits Lungs: CTA bilaterally CV: RRR without murmur or gallop Abd: soft, NT, Positive BS, no hepatomegaly Ext: no C/C/E, distal pulses intact and equal Skin: warm/dry no rash  ASSESSMENT AND PLAN:

## 2010-12-12 NOTE — Assessment & Plan Note (Signed)
The patient is stable from a cardiac standpoint.  We had a long discussion about increasing activity level and maintaining focus on diet. Her medical program is appropriate she will continue on this.

## 2011-01-17 ENCOUNTER — Encounter: Payer: Medicare Other | Admitting: *Deleted

## 2011-06-29 ENCOUNTER — Other Ambulatory Visit: Payer: Self-pay | Admitting: Family Medicine

## 2011-06-29 DIAGNOSIS — Z139 Encounter for screening, unspecified: Secondary | ICD-10-CM

## 2011-07-05 ENCOUNTER — Ambulatory Visit (HOSPITAL_COMMUNITY)
Admission: RE | Admit: 2011-07-05 | Discharge: 2011-07-05 | Disposition: A | Discharge status: Home / Self Care | Payer: Medicare Other | Source: Ambulatory Visit | Attending: Family Medicine | Admitting: Family Medicine

## 2011-07-05 DIAGNOSIS — Z139 Encounter for screening, unspecified: Secondary | ICD-10-CM

## 2011-07-05 DIAGNOSIS — Z1231 Encounter for screening mammogram for malignant neoplasm of breast: Secondary | ICD-10-CM | POA: Diagnosis not present

## 2011-07-20 DIAGNOSIS — J988 Other specified respiratory disorders: Secondary | ICD-10-CM | POA: Diagnosis not present

## 2011-07-20 DIAGNOSIS — J42 Unspecified chronic bronchitis: Secondary | ICD-10-CM | POA: Diagnosis not present

## 2011-07-25 DIAGNOSIS — R5381 Other malaise: Secondary | ICD-10-CM | POA: Diagnosis not present

## 2011-07-25 DIAGNOSIS — R5383 Other fatigue: Secondary | ICD-10-CM | POA: Diagnosis not present

## 2011-07-25 DIAGNOSIS — I259 Chronic ischemic heart disease, unspecified: Secondary | ICD-10-CM | POA: Diagnosis not present

## 2011-07-25 DIAGNOSIS — I10 Essential (primary) hypertension: Secondary | ICD-10-CM | POA: Diagnosis not present

## 2011-07-25 DIAGNOSIS — J42 Unspecified chronic bronchitis: Secondary | ICD-10-CM | POA: Diagnosis not present

## 2011-08-04 DIAGNOSIS — E782 Mixed hyperlipidemia: Secondary | ICD-10-CM | POA: Diagnosis not present

## 2011-08-04 DIAGNOSIS — D649 Anemia, unspecified: Secondary | ICD-10-CM | POA: Diagnosis not present

## 2011-08-04 DIAGNOSIS — Z79899 Other long term (current) drug therapy: Secondary | ICD-10-CM | POA: Diagnosis not present

## 2011-08-04 DIAGNOSIS — R5381 Other malaise: Secondary | ICD-10-CM | POA: Diagnosis not present

## 2011-08-04 DIAGNOSIS — R5383 Other fatigue: Secondary | ICD-10-CM | POA: Diagnosis not present

## 2011-10-04 DIAGNOSIS — I871 Compression of vein: Secondary | ICD-10-CM | POA: Diagnosis not present

## 2011-10-04 DIAGNOSIS — I10 Essential (primary) hypertension: Secondary | ICD-10-CM | POA: Diagnosis not present

## 2011-10-05 DIAGNOSIS — Z79899 Other long term (current) drug therapy: Secondary | ICD-10-CM | POA: Diagnosis not present

## 2011-10-08 ENCOUNTER — Other Ambulatory Visit: Payer: Self-pay | Admitting: Cardiovascular Disease

## 2011-11-17 DIAGNOSIS — Z23 Encounter for immunization: Secondary | ICD-10-CM | POA: Diagnosis not present

## 2011-11-25 ENCOUNTER — Encounter: Payer: Self-pay | Admitting: Cardiovascular Disease

## 2011-11-25 ENCOUNTER — Ambulatory Visit: Payer: Medicare Other | Admitting: Cardiovascular Disease

## 2011-11-25 ENCOUNTER — Ambulatory Visit (INDEPENDENT_AMBULATORY_CARE_PROVIDER_SITE_OTHER): Payer: Medicare Other | Admitting: Cardiovascular Disease

## 2011-11-25 VITALS — BP 169/67 | HR 58 | Ht 62.0 in | Wt 192.0 lb

## 2011-11-25 DIAGNOSIS — I6529 Occlusion and stenosis of unspecified carotid artery: Secondary | ICD-10-CM | POA: Diagnosis not present

## 2011-11-25 DIAGNOSIS — I2510 Atherosclerotic heart disease of native coronary artery without angina pectoris: Secondary | ICD-10-CM

## 2011-11-25 DIAGNOSIS — R0602 Shortness of breath: Secondary | ICD-10-CM | POA: Diagnosis not present

## 2011-11-25 DIAGNOSIS — J988 Other specified respiratory disorders: Secondary | ICD-10-CM | POA: Diagnosis not present

## 2011-11-25 DIAGNOSIS — J0100 Acute maxillary sinusitis, unspecified: Secondary | ICD-10-CM | POA: Diagnosis not present

## 2011-11-25 NOTE — Patient Instructions (Signed)
Your physician has requested that you have a carotid duplex. This test is an ultrasound of the carotid arteries in your neck. It looks at blood flow through these arteries that supply the brain with blood. Allow one hour for this exam. There are no restrictions or special instructions.  Your physician wants you to follow-up in: 1 YEAR.  You will receive a reminder letter in the mail two months in advance. If you don't receive a letter, please call our office to schedule the follow-up appointment.  Your physician recommends that you continue on your current medications as directed. Please refer to the Current Medication list given to you today.  

## 2011-11-25 NOTE — Progress Notes (Signed)
HPI:  76 year old woman presenting for followup evaluation. She was last seen one year ago. She has coronary artery disease and has undergone stenting of the LAD. She had a repeat cardiac catheterization 2012 because of exertional chest pain and progressive dyspnea. At that time her LAD stent was demonstrated to be widely patent and there was nonobstructive disease elsewhere. Her left ventricular function is preserved.  Her last carotid duplex was in May 2012. This demonstrated 60-79% stenosis bilaterally. She's had no neurologic symptoms.  The patient's main complaint is shortness of breath with low-level activity. His complaint is long-standing but progressive. She denies cough. She has had some sinus congestion and postnasal drainage. She denies orthopnea, PND, or chest pain. She denies leg swelling at present, but has required diuretic adjustments.  Outpatient Encounter Prescriptions as of 11/25/2011  Medication Sig Dispense Refill  . alendronate (FOSAMAX) 70 MG tablet Take 70 mg by mouth every 7 (seven) days. Take with a full glass of water on an empty stomach.       . Alpha-D-Galactosidase (BEANO PO) Take by mouth as needed.        Marland Kitchen amLODipine (NORVASC) 5 MG tablet Take 5 mg by mouth daily.        Marland Kitchen aspirin 81 MG EC tablet Take 81 mg by mouth daily.        . Cetirizine HCl (ZYRTEC ALLERGY PO) Take by mouth as needed.        . citalopram (CELEXA) 40 MG tablet Take 40 mg by mouth daily.        Marland Kitchen donepezil (ARICEPT) 10 MG tablet Take 10 mg by mouth daily.        Marland Kitchen Fexofenadine HCl (ALLEGRA PO) Take by mouth as needed.        . fluticasone (FLONASE) 50 MCG/ACT nasal spray Place 2 sprays into the nose daily.        . furosemide (LASIX) 20 MG tablet 1 1/2 TAB DAILY      . IBUPROFEN PO Take by mouth as needed.        . Lansoprazole (PREVACID PO) Take by mouth as needed.        Marland Kitchen LORazepam (ATIVAN) 1 MG tablet Take 1 mg by mouth at bedtime.        Marland Kitchen losartan (COZAAR) 100 MG tablet Take 100 mg  by mouth daily.      . nitroGLYCERIN (NITROSTAT) 0.4 MG SL tablet Place 0.4 mg under the tongue as needed.        . pravastatin (PRAVACHOL) 40 MG tablet Take 40 mg by mouth at bedtime.        Marland Kitchen DISCONTD: furosemide (LASIX) 40 MG tablet TAKE 1 TABLET BY MOUTH DAILY  30 tablet  9  . DISCONTD: olmesartan (BENICAR) 40 MG tablet Take 40 mg by mouth daily.          Allergies  Allergen Reactions  . Acetaminophen   . Sulfonamide Derivatives     Past Medical History  Diagnosis Date  . CAD (coronary artery disease) 08/2009    s/p PCI of the LAD  . Myocardial infarction     Non-st segment elevated  . Hypertension   . Hyperlipidemia   . Bradycardia   . Carotid artery stenosis   . COPD (chronic obstructive pulmonary disease)   . Chronic headache   . Asthma   . Pneumonia   . Dementia   . Ulnar neuropathy     ROS: Negative except as per HPI  BP 169/67  Pulse 58  Ht 5\' 2"  (1.575 m)  Wt 87.091 kg (192 lb)  BMI 35.12 kg/m2  PHYSICAL EXAM: Pt is alert and oriented, NAD HEENT: normal Neck: JVP - normal, carotids 2+= without bruits Lungs: Expiratory wheezing noted CV: RRR without murmur or gallop Abd: soft, NT, Positive BS, no hepatomegaly Ext: no C/C/E, distal pulses intact and equal Skin: warm/dry no rash  EKG:  Sinus rhythm 58 beats per minute, nonspecific T wave abnormality.  ASSESSMENT AND PLAN: 1. CAD, native vessel. The patient remained stable without anginal symptoms. She underwent cardiac catheterization last year with findings as outlined above. She remains on aspirin for antiplatelet therapy and pravastatin for lipid lowering.  2. Shortness of breath. Suspect this is multifactorial. Her lung exam suggests a possible component of COPD in this former smoker. Her weight is increased another 11 pounds since I saw her last year. We had a long discussion about diet. Her exercise options are limited because of back problems and shortness of breath. She is going to consider water  exercises.  3. Hypertension. Systolic blood pressure is elevated today. She feels anxious and attributes this to anxiety. We'll continue her same medications.  For followup, I like to see her back in 12 months.  Tonny Bollman 11/25/2011 10:37 AM

## 2011-12-19 ENCOUNTER — Other Ambulatory Visit: Payer: Self-pay | Admitting: Cardiology

## 2011-12-19 DIAGNOSIS — I6529 Occlusion and stenosis of unspecified carotid artery: Secondary | ICD-10-CM

## 2011-12-22 DIAGNOSIS — J988 Other specified respiratory disorders: Secondary | ICD-10-CM | POA: Diagnosis not present

## 2011-12-22 DIAGNOSIS — I871 Compression of vein: Secondary | ICD-10-CM | POA: Diagnosis not present

## 2011-12-26 ENCOUNTER — Encounter (INDEPENDENT_AMBULATORY_CARE_PROVIDER_SITE_OTHER): Payer: Medicare Other

## 2011-12-26 DIAGNOSIS — I6529 Occlusion and stenosis of unspecified carotid artery: Secondary | ICD-10-CM

## 2012-04-28 ENCOUNTER — Encounter: Payer: Self-pay | Admitting: *Deleted

## 2012-05-01 ENCOUNTER — Ambulatory Visit: Payer: Self-pay | Admitting: Family Medicine

## 2012-05-03 ENCOUNTER — Ambulatory Visit (INDEPENDENT_AMBULATORY_CARE_PROVIDER_SITE_OTHER): Payer: Medicare Other | Admitting: Family Medicine

## 2012-05-03 ENCOUNTER — Encounter: Payer: Self-pay | Admitting: Family Medicine

## 2012-05-03 VITALS — BP 166/68 | HR 80 | Wt 181.4 lb

## 2012-05-03 DIAGNOSIS — I10 Essential (primary) hypertension: Secondary | ICD-10-CM

## 2012-05-03 DIAGNOSIS — J441 Chronic obstructive pulmonary disease with (acute) exacerbation: Secondary | ICD-10-CM

## 2012-05-03 DIAGNOSIS — R5381 Other malaise: Secondary | ICD-10-CM | POA: Diagnosis not present

## 2012-05-03 DIAGNOSIS — R062 Wheezing: Secondary | ICD-10-CM

## 2012-05-03 DIAGNOSIS — R7309 Other abnormal glucose: Secondary | ICD-10-CM

## 2012-05-03 DIAGNOSIS — E559 Vitamin D deficiency, unspecified: Secondary | ICD-10-CM | POA: Diagnosis not present

## 2012-05-03 DIAGNOSIS — E785 Hyperlipidemia, unspecified: Secondary | ICD-10-CM

## 2012-05-03 DIAGNOSIS — R5383 Other fatigue: Secondary | ICD-10-CM

## 2012-05-03 DIAGNOSIS — R739 Hyperglycemia, unspecified: Secondary | ICD-10-CM

## 2012-05-03 MED ORDER — ALBUTEROL SULFATE (5 MG/ML) 0.5% IN NEBU
2.5000 mg | INHALATION_SOLUTION | Freq: Once | RESPIRATORY_TRACT | Status: AC
  Administered 2012-05-03: 2.5 mg via RESPIRATORY_TRACT

## 2012-05-03 MED ORDER — AZITHROMYCIN 250 MG PO TABS: ORAL_TABLET | ORAL | Status: DC

## 2012-05-03 MED ORDER — PREDNISONE 20 MG PO TABS: ORAL_TABLET | ORAL | Status: DC

## 2012-05-03 MED ORDER — ALBUTEROL SULFATE HFA 108 (90 BASE) MCG/ACT IN AERS: 2.0000 | INHALATION_SPRAY | Freq: Four times a day (QID) | RESPIRATORY_TRACT | Status: DC | PRN
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
## 2012-05-03 NOTE — Patient Instructions (Signed)
Should gradually get better. Do your labs

## 2012-05-04 ENCOUNTER — Emergency Department (HOSPITAL_COMMUNITY): Payer: Medicare Other

## 2012-05-04 ENCOUNTER — Emergency Department (HOSPITAL_COMMUNITY)
Admission: EM | Admit: 2012-05-04 | Discharge: 2012-05-04 | Disposition: A | Discharge status: Home / Self Care | Payer: Medicare Other | Attending: Emergency Medicine | Admitting: Emergency Medicine

## 2012-05-04 ENCOUNTER — Encounter (HOSPITAL_COMMUNITY): Payer: Self-pay | Admitting: *Deleted

## 2012-05-04 DIAGNOSIS — E785 Hyperlipidemia, unspecified: Secondary | ICD-10-CM | POA: Diagnosis not present

## 2012-05-04 DIAGNOSIS — IMO0002 Reserved for concepts with insufficient information to code with codable children: Secondary | ICD-10-CM | POA: Diagnosis not present

## 2012-05-04 DIAGNOSIS — I2510 Atherosclerotic heart disease of native coronary artery without angina pectoris: Secondary | ICD-10-CM | POA: Diagnosis not present

## 2012-05-04 DIAGNOSIS — J449 Chronic obstructive pulmonary disease, unspecified: Secondary | ICD-10-CM | POA: Insufficient documentation

## 2012-05-04 DIAGNOSIS — Z7982 Long term (current) use of aspirin: Secondary | ICD-10-CM | POA: Diagnosis not present

## 2012-05-04 DIAGNOSIS — R5383 Other fatigue: Secondary | ICD-10-CM | POA: Diagnosis not present

## 2012-05-04 DIAGNOSIS — Z8679 Personal history of other diseases of the circulatory system: Secondary | ICD-10-CM | POA: Diagnosis not present

## 2012-05-04 DIAGNOSIS — I10 Essential (primary) hypertension: Secondary | ICD-10-CM | POA: Diagnosis not present

## 2012-05-04 DIAGNOSIS — Z87891 Personal history of nicotine dependence: Secondary | ICD-10-CM | POA: Diagnosis not present

## 2012-05-04 DIAGNOSIS — Z8701 Personal history of pneumonia (recurrent): Secondary | ICD-10-CM | POA: Diagnosis not present

## 2012-05-04 DIAGNOSIS — R109 Unspecified abdominal pain: Secondary | ICD-10-CM | POA: Insufficient documentation

## 2012-05-04 DIAGNOSIS — Z8739 Personal history of other diseases of the musculoskeletal system and connective tissue: Secondary | ICD-10-CM | POA: Diagnosis not present

## 2012-05-04 DIAGNOSIS — J45909 Unspecified asthma, uncomplicated: Secondary | ICD-10-CM | POA: Insufficient documentation

## 2012-05-04 DIAGNOSIS — J40 Bronchitis, not specified as acute or chronic: Secondary | ICD-10-CM

## 2012-05-04 DIAGNOSIS — E559 Vitamin D deficiency, unspecified: Secondary | ICD-10-CM | POA: Diagnosis not present

## 2012-05-04 DIAGNOSIS — J209 Acute bronchitis, unspecified: Secondary | ICD-10-CM | POA: Diagnosis not present

## 2012-05-04 DIAGNOSIS — Z8669 Personal history of other diseases of the nervous system and sense organs: Secondary | ICD-10-CM | POA: Diagnosis not present

## 2012-05-04 DIAGNOSIS — Z79899 Other long term (current) drug therapy: Secondary | ICD-10-CM | POA: Diagnosis not present

## 2012-05-04 DIAGNOSIS — Z8659 Personal history of other mental and behavioral disorders: Secondary | ICD-10-CM | POA: Insufficient documentation

## 2012-05-04 DIAGNOSIS — R0602 Shortness of breath: Secondary | ICD-10-CM | POA: Diagnosis not present

## 2012-05-04 DIAGNOSIS — I252 Old myocardial infarction: Secondary | ICD-10-CM | POA: Insufficient documentation

## 2012-05-04 DIAGNOSIS — R7309 Other abnormal glucose: Secondary | ICD-10-CM | POA: Diagnosis not present

## 2012-05-04 DIAGNOSIS — J4489 Other specified chronic obstructive pulmonary disease: Secondary | ICD-10-CM | POA: Insufficient documentation

## 2012-05-04 DIAGNOSIS — J440 Chronic obstructive pulmonary disease with acute lower respiratory infection: Secondary | ICD-10-CM | POA: Diagnosis not present

## 2012-05-04 DIAGNOSIS — R062 Wheezing: Secondary | ICD-10-CM | POA: Diagnosis not present

## 2012-05-04 LAB — HEMOGLOBIN A1C
Hgb A1c MFr Bld: 6.4 % — ABNORMAL HIGH (ref <5.7)
Mean Plasma Glucose: 137 mg/dL — ABNORMAL HIGH (ref <117)

## 2012-05-04 LAB — CBC
HCT: 37.4 % (ref 36.0–46.0)
Hemoglobin: 12.6 g/dL (ref 12.0–15.0)
WBC: 8.3 10*3/uL (ref 4.0–10.5)

## 2012-05-04 LAB — LIPID PANEL
Cholesterol: 136 mg/dL (ref 0–200)
Total CHOL/HDL Ratio: 3.2 Ratio
VLDL: 17 mg/dL (ref 0–40)

## 2012-05-04 LAB — HEPATIC FUNCTION PANEL
ALT: 13 U/L (ref 0–35)
AST: 14 U/L (ref 0–37)
Bilirubin, Direct: 0.1 mg/dL (ref 0.0–0.3)
Indirect Bilirubin: 0.2 mg/dL (ref 0.0–0.9)
Total Bilirubin: 0.3 mg/dL (ref 0.3–1.2)

## 2012-05-04 LAB — CBC WITH DIFFERENTIAL/PLATELET
Basophils Relative: 0 % (ref 0–1)
Eosinophils Absolute: 0 10*3/uL (ref 0.0–0.7)
HCT: 35 % — ABNORMAL LOW (ref 36.0–46.0)
Hemoglobin: 11.9 g/dL — ABNORMAL LOW (ref 12.0–15.0)
MCH: 29 pg (ref 26.0–34.0)
MCHC: 34 g/dL (ref 30.0–36.0)
MCV: 85.2 fL (ref 78.0–100.0)
Monocytes Absolute: 0.2 10*3/uL (ref 0.1–1.0)
Monocytes Relative: 2 % — ABNORMAL LOW (ref 3–12)

## 2012-05-04 LAB — BASIC METABOLIC PANEL
BUN: 24 mg/dL — ABNORMAL HIGH (ref 6–23)
CO2: 28 mEq/L (ref 19–32)
Calcium: 8.9 mg/dL (ref 8.4–10.5)
Glucose, Bld: 157 mg/dL — ABNORMAL HIGH (ref 70–99)
Potassium: 4.8 mEq/L (ref 3.5–5.3)
Sodium: 137 mEq/L (ref 135–145)

## 2012-05-04 LAB — TROPONIN I: Troponin I: <0.3 ng/mL (ref <0.30)

## 2012-05-04 MED ORDER — IOHEXOL 350 MG/ML SOLN
100.0000 mL | Freq: Once | INTRAVENOUS | Status: AC | PRN
  Administered 2012-05-04: 100 mL via INTRAVENOUS

## 2012-05-04 MED ORDER — ALBUTEROL SULFATE (5 MG/ML) 0.5% IN NEBU
10.0000 mg | INHALATION_SOLUTION | Freq: Once | RESPIRATORY_TRACT | Status: DC
  Filled 2012-05-04: qty 1

## 2012-05-04 MED ORDER — IPRATROPIUM BROMIDE 0.02 % IN SOLN
0.5000 mg | Freq: Once | RESPIRATORY_TRACT | Status: AC
  Administered 2012-05-04: 0.5 mg via RESPIRATORY_TRACT
  Filled 2012-05-04: qty 2.5

## 2012-05-04 MED ORDER — ALBUTEROL SULFATE (5 MG/ML) 0.5% IN NEBU
5.0000 mg | INHALATION_SOLUTION | Freq: Once | RESPIRATORY_TRACT | Status: AC
  Administered 2012-05-04: 5 mg via RESPIRATORY_TRACT

## 2012-05-04 NOTE — ED Provider Notes (Signed)
History    This chart was scribed for Heather Lennert, MD, by [name], ED scribe. The patient was seen in room APA01/[facility] and the patient's care was started at 1811.    CSN: [id number]  Arrival date & time 05/04/12  1759   First MD Initiated Contact with Patient 05/04/12 1811      Chief Complaint  Patient presents with  . Shortness of Breath    (Consider location/radiation/quality/duration/timing/severity/associated sxs/prior treatment) Patient is a 77 y.o. female presenting with shortness of breath. The history is provided by the patient. No language interpreter was used.  Shortness of Breath Duration:  1 week Timing:  Constant Associated symptoms: abdominal pain   Associated symptoms: no chest pain, no cough, no headaches and no rash   Risk factors: tobacco use     [name] is a 77 y.o. female who presents to the Emergency Department complaining of constant SOB that is aggravated by movement and began last week. She reports that she was scheduled to see [name] two days ago, but the office was closed due to weather so she saw him yesterday instead. She states that her D-dimer was elevated, and she was sent to the ED for a CT. In ED, she is complaining of sudden onset lower abdominal pain that began earlier today, but she believes may be due to a BM. She is a former 1 pack a day smoker for 35 years, but quit in 2010.  Past Medical History  Diagnosis Date  . CAD (coronary artery disease) 08/2009    s/p PCI of the LAD  . Myocardial infarction     Non-st segment elevated  . Hypertension   . Hyperlipidemia   . Bradycardia   . Carotid artery stenosis   . COPD (chronic obstructive pulmonary disease)   . Chronic headache   . Asthma   . Pneumonia   . Ulnar neuropathy   . Seasonal allergies   . Impaired fasting glucose   . Dementia     patient denies this  . Osteoporosis 2009    Past Surgical History  Procedure Laterality Date  . Partial hysterectomy  1984   . Radial artery catheter      Bladder  . Bladder surgery  1991  . Cardiac catheterization  11/2010    negative    Family History  Problem Relation Age of Onset  . Addison's disease Mother   . Alcohol abuse Father     History  Substance Use Topics  . Smoking status: Former Smoker -- 1.00 packs/day for 35 years    Quit date: 02/15/2008  . Smokeless tobacco: Never Used  . Alcohol Use: No    OB History   Grav Para Term Preterm Abortions TAB SAB Ect Mult Living                  Review of Systems  Constitutional: Negative for fatigue.  HENT: Negative for congestion, sinus pressure and ear discharge.   Eyes: Negative for discharge.  Respiratory: Positive for shortness of breath. Negative for cough.   Cardiovascular: Negative for chest pain.  Gastrointestinal: Positive for abdominal pain. Negative for diarrhea.  Genitourinary: Negative for frequency and hematuria.  Musculoskeletal: Negative for back pain.  Skin: Negative for rash.  Neurological: Negative for seizures and headaches.  Psychiatric/Behavioral: Negative for hallucinations.    Allergies  Acetaminophen; Codeine; Dilaudid; Erythromycin; and Sulfonamide derivatives  Home Medications   Current Outpatient Rx  Name  Route  Sig  Dispense  Refill  . albuterol (PROVENTIL HFA;VENTOLIN HFA) 108 (90 BASE) MCG/ACT inhaler   Inhalation   Inhale 2 puffs into the lungs every 6 (six) hours as needed for wheezing.   1 Inhaler   6   . alendronate (FOSAMAX) 70 MG tablet   Oral   Take 70 mg by mouth every 7 (seven) days. Take with a full glass of water on an empty stomach on MONDAYS         . Alpha-D-Galactosidase (BEANO) TABS   Oral   Take 2 tablets by mouth at bedtime.         Marland Kitchen amLODipine (NORVASC) 5 MG tablet   Oral   Take 5 mg by mouth every morning.          Marland Kitchen aspirin 81 MG EC tablet   Oral   Take 81 mg by mouth every morning.          Marland Kitchen azithromycin (ZITHROMAX) 250 MG tablet      Take 2 tablets  (500 mg) on  Day 1,  followed by 1 tablet (250 mg) once daily on Days 2 through 5.         . cetirizine (ZYRTEC) 10 MG tablet   Oral   Take 10 mg by mouth daily as needed for allergies.         . citalopram (CELEXA) 40 MG tablet   Oral   Take 20 mg by mouth daily.          Marland Kitchen donepezil (ARICEPT) 10 MG tablet   Oral   Take 10 mg by mouth at bedtime.          . fluticasone (FLONASE) 50 MCG/ACT nasal spray   Nasal   Place 2 sprays into the nose daily as needed for rhinitis or allergies.          . furosemide (LASIX) 20 MG tablet   Oral   Take 30 mg by mouth every morning. 1 1/2 TAB DAILY         . ibuprofen (ADVIL,MOTRIN) 200 MG tablet   Oral   Take 400 mg by mouth daily as needed for pain.         Marland Kitchen lansoprazole (PREVACID) 15 MG capsule   Oral   Take 15 mg by mouth daily as needed (FOR ACID REFLUX/GERD).         . LORazepam (ATIVAN) 1 MG tablet   Oral   Take 1 mg by mouth at bedtime.           Marland Kitchen losartan (COZAAR) 100 MG tablet   Oral   Take 100 mg by mouth every morning.          . mirtazapine (REMERON) 15 MG tablet   Oral   Take 7.5 mg by mouth at bedtime.         . pravastatin (PRAVACHOL) 40 MG tablet   Oral   Take 40 mg by mouth at bedtime.           . predniSONE (DELTASONE) 20 MG tablet   Oral   Take 20-60 mg by mouth as directed. 3 tabs per day for three days, two per day for three days,one per day per three days         . nitroGLYCERIN (NITROSTAT) 0.4 MG SL tablet   Sublingual   Place 0.4 mg under the tongue as needed for chest pain.            BP 134/50  Pulse 83  Temp(Src) 99 F (37.2 C) (Oral)  Resp 24  Ht 5\' 3"  (1.6 m)  Wt 180 lb (81.647 kg)  BMI 31.89 kg/m2  SpO2 92%  Physical Exam  Nursing note and vitals reviewed. Constitutional: She is oriented to person, place, and time. She appears well-developed.  HENT:  Head: Normocephalic and atraumatic.  Eyes: Conjunctivae and EOM are normal. No scleral icterus.   Neck: Neck supple. No thyromegaly present.  Cardiovascular: Normal rate and regular rhythm.  Exam reveals no gallop and no friction rub.   No murmur heard. Pulmonary/Chest: No stridor. She has wheezes. She has no rales. She exhibits no tenderness.  Mild wheezing bilaterally  Abdominal: She exhibits no distension. There is no tenderness. There is no rebound.  Musculoskeletal: Normal range of motion. She exhibits no edema.  Lymphadenopathy:    She has no cervical adenopathy.  Neurological: She is oriented to person, place, and time. Coordination normal.  Skin: No rash noted. No erythema.  Psychiatric: She has a normal mood and affect. Her behavior is normal.    ED Course  Procedures (including critical care time)  DIAGNOSTIC STUDIES: Oxygen Saturation is 97% on room air, adequate by my interpretation.    COORDINATION OF CARE:  18:25- Discussed planned course of treatment with the patient, including a breathing treatment, atrovent, CT angio, chest X-ray, CBC, Pro b natriuretic, and Troponin, who is agreeable at this time.  18:30- Medication Orders- ipratropium (atrovent) nebulizer solution 0.5 mg- once.  19:00- Medication Orders- albuterol (proventil) (5mg /ml) 0.5% nebulizer solution 10 mg-once.  20:54- Recheck- Her wheezing is minimal, and she is ready for discharge.  Results for orders placed during the hospital encounter of 05/04/12  CBC WITH DIFFERENTIAL      Result Value Range   WBC 8.7  4.0 - 10.5 K/uL   RBC 4.11  3.87 - 5.11 MIL/uL   Hemoglobin 11.9 (*) 12.0 - 15.0 g/dL   HCT 40.9 (*) 81.1 - 91.4 %   MCV 85.2  78.0 - 100.0 fL   MCH 29.0  26.0 - 34.0 pg   MCHC 34.0  30.0 - 36.0 g/dL   RDW 78.2  95.6 - 21.3 %   Platelets 227  150 - 400 K/uL   Neutrophils Relative 90 (*) 43 - 77 %   Neutro Abs 7.9 (*) 1.7 - 7.7 K/uL   Lymphocytes Relative 8 (*) 12 - 46 %   Lymphs Abs 0.7  0.7 - 4.0 K/uL   Monocytes Relative 2 (*) 3 - 12 %   Monocytes Absolute 0.2  0.1 - 1.0 K/uL    Eosinophils Relative 0  0 - 5 %   Eosinophils Absolute 0.0  0.0 - 0.7 K/uL   Basophils Relative 0  0 - 1 %   Basophils Absolute 0.0  0.0 - 0.1 K/uL  PRO B NATRIURETIC PEPTIDE      Result Value Range   Pro B Natriuretic peptide (BNP) 788.0 (*) 0 - 125 pg/mL  TROPONIN I      Result Value Range   Troponin I <0.30  <0.30 ng/mL   Labs Reviewed  CBC WITH DIFFERENTIAL - Abnormal; Notable for the following:    Hemoglobin 11.9 (*)    HCT 35.0 (*)    Neutrophils Relative 90 (*)    Neutro Abs 7.9 (*)    Lymphocytes Relative 8 (*)    Monocytes Relative 2 (*)    All other components within normal limits  PRO B NATRIURETIC PEPTIDE - Abnormal; Notable for the  following:    Pro B Natriuretic peptide (BNP) 788.0 (*)    All other components within normal limits  TROPONIN I   Ct Angio Chest Pe W/cm &/or Wo Cm  05/04/2012  *RADIOLOGY REPORT*  Clinical Data: 77 year old female with shortness of breath and elevated D-dimer.  CT ANGIOGRAPHY CHEST  Technique:  Multidetector CT imaging of the chest using the standard protocol during bolus administration of intravenous contrast. Multiplanar reconstructed images including MIPs were obtained and reviewed to evaluate the vascular anatomy.  Contrast: OMNIPAQUE IOHEXOL 350 MG/ML SOLN  Comparison: 08/19/2009 CT  Findings: This is a technically satisfactory study.  No pulmonary emboli are identified. There is no evidence of thoracic aortic aneurysm or definite dissection. Cardiomegaly is noted with moderate coronary artery calcifications and LAD stent. Moderate atherosclerotic calcification at the origin of the left subclavian artery noted.  There are no pleural or pericardial effusions present. No enlarged lymph nodes are identified.  Mild centrilobular emphysema is noted. Minimal right middle lobe and lingular scarring again noted. There is no evidence of airspace disease, consolidation, suspicious nodule, mass or endobronchial/endotracheal lesion.  Stenosis at the  origin of the hepatic artery from the aorta again noted. No acute or suspicious bony abnormalities are identified. Remote compression fractures of T11 and T5 are again identified.  IMPRESSION: No evidence of acute abnormality - no evidence of pulmonary emboli or thoracic aortic aneurysm.  Cardiomegaly, coronary artery disease and cardiac stent.  Mild centrilobular emphysema.   Original Report Authenticated By: Harmon Pier, M.D.    Dg Chest Port 1 View  05/04/2012  *RADIOLOGY REPORT*  Clinical Data: Shortness of breath.  PORTABLE CHEST - 1 VIEW  Comparison: 11/19/2009  Findings: Moderate cardiomegaly observed.  No edema.  Prominent epicardial fat pad causes the vague density along the right medial heart border.  Accounting for that and the underlying vasculature, the lungs appear clear.  No pleural effusion is apparent.  IMPRESSION:  1.  Moderate cardiomegaly.  Otherwise negative.   Original Report Authenticated By: Gaylyn Rong, M.D.      No diagnosis found.    MDM  The chart was scribed for me under my direct supervision.  I personally performed the history, physical, and medical decision making and all procedures in the evaluation of this patient.Heather Lennert, MD 05/04/12 2101

## 2012-05-04 NOTE — ED Notes (Addendum)
Seen by Dr Gerda Diss yesterday for Sob,  D Dimer was elevated and sent here for  Ct scan.  Alert, , Sob has improved.  Pt has scarring of rt eye S/P shingles.

## 2012-05-05 LAB — VITAMIN D 25 HYDROXY (VIT D DEFICIENCY, FRACTURES): Vit D, 25-Hydroxy: 10 ng/mL — ABNORMAL LOW (ref 30–89)

## 2012-05-15 ENCOUNTER — Telehealth: Payer: Self-pay | Admitting: Family Medicine

## 2012-05-15 NOTE — Telephone Encounter (Signed)
Vomiting and Diarrhea.  Can not take medication due to the vomiting?  Can something be called in to [facility] for these symptoms?

## 2012-05-15 NOTE — Telephone Encounter (Signed)
Talked to [facility] and advised per Dr. Lorin Picket recommend clear liquids small amount every 5 minutes. If he vomits weight 20 minutes then restart. Also called in prescription for Zofran 8 mg one tablet every 8 hours as needed for nausea. #15 no refills if progressive vomiting please followup.

## 2012-05-15 NOTE — Telephone Encounter (Signed)
Recommend clear liquids small amount every 5 minutes. If he vomits weight 20 minutes then restart. Also call in prescription for Zofran 8 mg one tablet every 8 hours as needed for nausea. #15 no refills if progressive vomiting please followup

## 2012-05-15 NOTE — Telephone Encounter (Signed)
Granddaughter states vomiting since this am, with diarhhea, denies fever, or pain anywhere else.  Tried phenergan po but does not strength it was.  Pt vomited the po phenergen. Pt is resting in bed at this moment per granddaughter

## 2012-05-18 ENCOUNTER — Other Ambulatory Visit: Payer: Self-pay | Admitting: Family Medicine

## 2012-06-16 ENCOUNTER — Other Ambulatory Visit: Payer: Self-pay | Admitting: Family Medicine

## 2012-06-27 ENCOUNTER — Encounter (INDEPENDENT_AMBULATORY_CARE_PROVIDER_SITE_OTHER): Payer: Medicare Other

## 2012-06-27 DIAGNOSIS — I6529 Occlusion and stenosis of unspecified carotid artery: Secondary | ICD-10-CM

## 2012-07-12 ENCOUNTER — Other Ambulatory Visit: Payer: Self-pay | Admitting: Family Medicine

## 2012-07-13 ENCOUNTER — Other Ambulatory Visit: Payer: Self-pay | Admitting: *Deleted

## 2012-07-13 MED ORDER — FUROSEMIDE 20 MG PO TABS: 20.0000 mg | ORAL_TABLET | Freq: Every day | ORAL | Status: DC

## 2012-07-13 NOTE — Telephone Encounter (Signed)
Medication refilled

## 2012-07-29 ENCOUNTER — Other Ambulatory Visit: Payer: Self-pay | Admitting: Family Medicine

## 2012-08-23 ENCOUNTER — Other Ambulatory Visit: Payer: Self-pay | Admitting: Family Medicine

## 2012-08-24 ENCOUNTER — Other Ambulatory Visit: Payer: Self-pay | Admitting: *Deleted

## 2012-08-24 MED ORDER — FUROSEMIDE 20 MG PO TABS: 20.0000 mg | ORAL_TABLET | Freq: Every day | ORAL | Status: DC

## 2012-08-27 ENCOUNTER — Other Ambulatory Visit: Payer: Self-pay | Admitting: Family Medicine

## 2012-09-12 ENCOUNTER — Other Ambulatory Visit: Payer: Self-pay | Admitting: Family Medicine

## 2012-09-24 ENCOUNTER — Other Ambulatory Visit: Payer: Self-pay | Admitting: Family Medicine

## 2012-09-24 NOTE — Telephone Encounter (Signed)
Refill x2 patient needs followup office visit before further refills could be given

## 2012-10-04 ENCOUNTER — Other Ambulatory Visit: Payer: Self-pay | Admitting: Family Medicine

## 2012-10-07 ENCOUNTER — Other Ambulatory Visit: Payer: Self-pay | Admitting: Family Medicine

## 2012-10-09 ENCOUNTER — Other Ambulatory Visit: Payer: Self-pay | Admitting: Family Medicine

## 2012-10-11 ENCOUNTER — Other Ambulatory Visit: Payer: Self-pay | Admitting: Family Medicine

## 2012-10-22 ENCOUNTER — Ambulatory Visit (INDEPENDENT_AMBULATORY_CARE_PROVIDER_SITE_OTHER): Payer: Medicare Other | Admitting: Family Medicine

## 2012-10-22 ENCOUNTER — Encounter: Payer: Self-pay | Admitting: Family Medicine

## 2012-10-22 VITALS — BP 122/80 | Ht 62.0 in | Wt 188.4 lb

## 2012-10-22 DIAGNOSIS — R0609 Other forms of dyspnea: Secondary | ICD-10-CM | POA: Diagnosis not present

## 2012-10-22 LAB — BASIC METABOLIC PANEL
Chloride: 103 mEq/L (ref 96–112)
Potassium: 4.7 mEq/L (ref 3.5–5.3)

## 2012-10-22 MED ORDER — PREDNISONE 20 MG PO TABS: ORAL_TABLET | ORAL | Status: DC

## 2012-10-22 NOTE — Progress Notes (Signed)
  Subjective:    Patient ID: [name], female    DOB: 28-May-1935, 77 y.o.   MRN: [id number]  Chest Pain  This is a new problem. The current episode started more than 1 month ago. The onset quality is gradual. The problem occurs daily (with activity). The problem has been unchanged. The pain is present in the substernal region. Pain severity now: feels like a heaviness on her chest. The quality of the pain is described as pressure and tightness. The pain radiates to the left arm and right arm. Associated symptoms include back pain (between the shoulder blades), lower extremity edema, palpitations and shortness of breath. Pertinent negatives include no orthopnea, PND, syncope or vomiting. She has tried rest for the symptoms. The treatment provided no relief. Prior diagnostic workup includes cardiac catherization (cath neg in 2012).   Patient with complaint of chest tightness and wheezing for few weeks. Breathing hard t times making her chest sore. Using inhaler but it is not helping. EKG was noted to show some strain Catheterization 2012 was negative Family history noncontributory  Review of Systems  Respiratory: Positive for shortness of breath.   Cardiovascular: Positive for chest pain and palpitations. Negative for orthopnea, syncope and PND.  Gastrointestinal: Negative for vomiting.  Musculoskeletal: Positive for back pain (between the shoulder blades).       Objective:   Physical Exam  Lungs have scattered wheezes heart is regular pulses normal abdomen soft extremities no edema      Assessment & Plan:  #1 any on exertion-this could be COPD with exacerbation but it certainly could be angina. I recommend moving up the appointment with Dr. Excell Seltzer sooner. I also believe this patient would benefit from a echo. In addition to this I recommend prednisone taper albuterol as needed followup here in one month

## 2012-10-23 ENCOUNTER — Ambulatory Visit (HOSPITAL_COMMUNITY)
Admission: RE | Admit: 2012-10-23 | Discharge: 2012-10-23 | Disposition: A | Discharge status: Home / Self Care | Payer: Medicare Other | Source: Ambulatory Visit | Attending: Family Medicine | Admitting: Family Medicine

## 2012-10-23 DIAGNOSIS — R0609 Other forms of dyspnea: Secondary | ICD-10-CM | POA: Insufficient documentation

## 2012-10-23 DIAGNOSIS — R0989 Other specified symptoms and signs involving the circulatory and respiratory systems: Secondary | ICD-10-CM | POA: Insufficient documentation

## 2012-10-23 LAB — BRAIN NATRIURETIC PEPTIDE: Brain Natriuretic Peptide: 126 pg/mL — ABNORMAL HIGH (ref 0.0–100.0)

## 2012-10-23 MED ORDER — ALBUTEROL SULFATE (5 MG/ML) 0.5% IN NEBU
2.5000 mg | INHALATION_SOLUTION | Freq: Once | RESPIRATORY_TRACT | Status: AC
  Administered 2012-10-23: 2.5 mg via RESPIRATORY_TRACT

## 2012-10-24 NOTE — Procedures (Signed)
NAMEEMONI, WHITWORTH              ACCOUNT NO.:  [id number]  MEDICAL RECORD NO.:  [id number]  LOCATION:  RESP                          FACILITY:  [facility]  PHYSICIAN:  Monserrat Vidaurri L. Juanetta Gosling, M.D.DATE OF BIRTH:  05/18/1935  DATE OF PROCEDURE: DATE OF DISCHARGE:  10/23/2012                           PULMONARY FUNCTION TEST   REASON FOR PULMONARY FUNCTION TESTING:  Dyspnea on exertion.  1. Spirometry shows a moderate-to-severe ventilatory defect with     evidence of airflow obstruction. 2. There is significant bronchodilator improvement. 3. This study is consistent with COPD.     Kieron Kantner L. Juanetta Gosling, M.D.     [facility]  D:  10/23/2012  T:  10/24/2012  Job:  [id number]  cc:   Lorin Picket A. Gerda Diss, MD Fax: [phone]

## 2012-10-25 ENCOUNTER — Ambulatory Visit (HOSPITAL_COMMUNITY)
Admission: RE | Admit: 2012-10-25 | Discharge: 2012-10-25 | Disposition: A | Discharge status: Home / Self Care | Payer: Medicare Other | Source: Ambulatory Visit | Attending: Family Medicine | Admitting: Family Medicine

## 2012-10-25 DIAGNOSIS — J4489 Other specified chronic obstructive pulmonary disease: Secondary | ICD-10-CM | POA: Insufficient documentation

## 2012-10-25 DIAGNOSIS — J449 Chronic obstructive pulmonary disease, unspecified: Secondary | ICD-10-CM | POA: Insufficient documentation

## 2012-10-25 DIAGNOSIS — I059 Rheumatic mitral valve disease, unspecified: Secondary | ICD-10-CM | POA: Diagnosis not present

## 2012-10-25 DIAGNOSIS — I10 Essential (primary) hypertension: Secondary | ICD-10-CM | POA: Insufficient documentation

## 2012-10-25 DIAGNOSIS — R0989 Other specified symptoms and signs involving the circulatory and respiratory systems: Secondary | ICD-10-CM | POA: Insufficient documentation

## 2012-10-25 DIAGNOSIS — R0609 Other forms of dyspnea: Secondary | ICD-10-CM | POA: Insufficient documentation

## 2012-10-25 LAB — PULMONARY FUNCTION TEST

## 2012-10-25 NOTE — Progress Notes (Signed)
*  PRELIMINARY RESULTS* Echocardiogram 2D Echocardiogram has been performed.  [name] 10/25/2012, 2:03 PM

## 2012-10-29 ENCOUNTER — Telehealth: Payer: Self-pay | Admitting: Family Medicine

## 2012-10-29 MED ORDER — TIOTROPIUM BROMIDE MONOHYDRATE 18 MCG IN CAPS: 18.0000 ug | ORAL_CAPSULE | Freq: Every day | RESPIRATORY_TRACT | Status: DC

## 2012-10-29 NOTE — Telephone Encounter (Signed)
Results discussed and med sent electronically to [facility] result notes.

## 2012-10-29 NOTE — Telephone Encounter (Signed)
Patients granddaughter is calling to check on labs she had done last week.

## 2012-11-01 ENCOUNTER — Encounter (HOSPITAL_COMMUNITY): Payer: Self-pay

## 2012-11-01 ENCOUNTER — Encounter: Payer: Self-pay | Admitting: Cardiovascular Disease

## 2012-11-01 ENCOUNTER — Ambulatory Visit (INDEPENDENT_AMBULATORY_CARE_PROVIDER_SITE_OTHER): Payer: Medicare Other | Admitting: Cardiovascular Disease

## 2012-11-01 VITALS — BP 118/62 | HR 56 | Ht 63.0 in | Wt 185.0 lb

## 2012-11-01 DIAGNOSIS — I10 Essential (primary) hypertension: Secondary | ICD-10-CM | POA: Diagnosis not present

## 2012-11-01 DIAGNOSIS — I2510 Atherosclerotic heart disease of native coronary artery without angina pectoris: Secondary | ICD-10-CM | POA: Diagnosis not present

## 2012-11-01 DIAGNOSIS — R079 Chest pain, unspecified: Secondary | ICD-10-CM | POA: Diagnosis not present

## 2012-11-01 LAB — CBC WITH DIFFERENTIAL/PLATELET
Basophils Relative: 0.3 % (ref 0.0–3.0)
Eosinophils Absolute: 0.1 10*3/uL (ref 0.0–0.7)
Lymphocytes Relative: 12.6 % (ref 12.0–46.0)
MCHC: 33.5 g/dL (ref 30.0–36.0)
MCV: 85.2 fl (ref 78.0–100.0)
Monocytes Absolute: 0.5 10*3/uL (ref 0.1–1.0)
Neutrophils Relative %: 80.3 % — ABNORMAL HIGH (ref 43.0–77.0)
Platelets: 185 10*3/uL (ref 150.0–400.0)
RBC: 4.37 Mil/uL (ref 3.87–5.11)
WBC: 8.2 10*3/uL (ref 4.5–10.5)

## 2012-11-01 LAB — BASIC METABOLIC PANEL
BUN: 29 mg/dL — ABNORMAL HIGH (ref 6–23)
CO2: 31 mEq/L (ref 19–32)
Calcium: 8.4 mg/dL (ref 8.4–10.5)
Creatinine, Ser: 1.1 mg/dL (ref 0.4–1.2)

## 2012-11-01 LAB — PROTIME-INR
INR: 1 ratio (ref 0.8–1.0)
Prothrombin Time: 10.5 s (ref 10.2–12.4)

## 2012-11-01 MED ORDER — ISOSORBIDE MONONITRATE ER 30 MG PO TB24: 30.0000 mg | ORAL_TABLET | Freq: Every day | ORAL | Status: DC

## 2012-11-01 MED ORDER — CLOPIDOGREL BISULFATE 75 MG PO TABS: 75.0000 mg | ORAL_TABLET | Freq: Every day | ORAL | Status: DC

## 2012-11-01 NOTE — Patient Instructions (Addendum)
Your physician has recommended you make the following change in your medication: START Isosorbide MN 30mg  take one by mouth daily, START Plavix take 300mg  (four of the 75mg  tablets) on Friday and then decrease to 75mg  once a day starting Saturday  Your physician has requested that you have a cardiac catheterization. Cardiac catheterization is used to diagnose and/or treat various heart conditions. Doctors may recommend this procedure for a number of different reasons. The most common reason is to evaluate chest pain. Chest pain can be a symptom of coronary artery disease (CAD), and cardiac catheterization can show whether plaque is narrowing or blocking your heart's arteries. This procedure is also used to evaluate the valves, as well as measure the blood flow and oxygen levels in different parts of your heart. For further information please visit [email or web address] Please follow instruction sheet, as given.

## 2012-11-01 NOTE — Progress Notes (Signed)
 HPI:   76-year-old woman presenting for followup evaluation. The patient is followed for coronary artery disease. Her last heart catheterization was in 2012 which demonstrated continued patency of her LAD stent and minor nonobstructive disease elsewhere.  [name] returns early for followup because she is having progressive anginal symptoms. She describes a heaviness and pressure-like sensation in her chest over the last 3-4 weeks, this began with moderate level of exertion, but is now occurring with minimal exertion. She is having no chest pain at rest. However, she describes episodes of pain even with walking around on one level in her house. She has only taken nitroglycerin once and this helped her pain. She has associated dyspnea. She denies orthopnea, PND, or edema. She's had no palpitations, lightheadedness, diaphoresis, nausea or vomiting, or syncope. The patient's chest pain is described as a heaviness and pressure-like sensation and it occurs in the center of her chest. It is relieved with rest within 5 minutes. There is no pain radiation.  Outpatient Encounter Prescriptions as of 11/01/2012  Medication Sig Dispense Refill  . albuterol (PROVENTIL HFA;VENTOLIN HFA) 108 (90 BASE) MCG/ACT inhaler Inhale 2 puffs into the lungs every 6 (six) hours as needed for wheezing.  1 Inhaler  6  . alendronate (FOSAMAX) 70 MG tablet TAKE 1 TABLET BY MOUTH EVERY WEEK AS DIRECTED  12 tablet  0  . Alpha-D-Galactosidase (BEANO) TABS Take 2 tablets by mouth at bedtime.      . amLODipine (NORVASC) 5 MG tablet TAKE 1 TABLET BY MOUTH EVERY MORNING  90 tablet  0  . aspirin 81 MG EC tablet Take 81 mg by mouth every morning.       . cetirizine (ZYRTEC) 10 MG tablet Take 10 mg by mouth daily as needed for allergies.      . citalopram (CELEXA) 40 MG tablet TAKE 1 TABLET BY MOUTH EVERY DAY  90 tablet  0  . donepezil (ARICEPT) 10 MG tablet Take 10 mg by mouth at bedtime.       . erythromycin ophthalmic ointment USE IN  EYE AS DIRECTED  3.5 g  0  . fluticasone (FLONASE) 50 MCG/ACT nasal spray Place 2 sprays into the nose daily as needed for rhinitis or allergies.       . furosemide (LASIX) 20 MG tablet Take 1 tablet (20 mg total) by mouth daily.  45 tablet  2  . ibuprofen (ADVIL,MOTRIN) 200 MG tablet Take 400 mg by mouth daily as needed for pain.      . lansoprazole (PREVACID) 15 MG capsule Take 15 mg by mouth daily as needed (FOR ACID REFLUX/GERD).      . LORazepam (ATIVAN) 1 MG tablet TAKE 1/2 TO 1 TABLET BY MOUTH EVERY NIGHT AT BEDTIME AS NEEDED  30 tablet  1  . losartan (COZAAR) 100 MG tablet TAKE ONE TABLET BY MOUTH D-REPLACES BENICAR  90 tablet  0  . mirtazapine (REMERON) 15 MG tablet TAKE 1/2 TABLET BY MOUTH EVERY NIGHT AT BEDTIME  15 tablet  0  . nitroGLYCERIN (NITROSTAT) 0.4 MG SL tablet Place 0.4 mg under the tongue as needed for chest pain.       . pravastatin (PRAVACHOL) 40 MG tablet TAKE 1 TABLET BY MOUTH EVERY DAY  90 tablet  0  . tiotropium (SPIRIVA) 18 MCG inhalation capsule Place 1 capsule (18 mcg total) into inhaler and inhale daily.  30 capsule  1   No facility-administered encounter medications on file as of 11/01/2012.    Allergies    Allergen Reactions  . Acetaminophen   . Codeine Nausea And Vomiting  . Dilaudid [Hydromorphone Hcl] Other (See Comments)    Extremely sensitive  . Erythromycin Other (See Comments)    Intense stomach pain  . Sulfonamide Derivatives     Past Medical History  Diagnosis Date  . CAD (coronary artery disease) 08/2009    s/p PCI of the LAD  . Myocardial infarction     Non-st segment elevated  . Hypertension   . Hyperlipidemia   . Bradycardia   . Carotid artery stenosis   . COPD (chronic obstructive pulmonary disease)   . Chronic headache   . Asthma   . Pneumonia   . Ulnar neuropathy   . Seasonal allergies   . Impaired fasting glucose   . Dementia     patient denies this  . Osteoporosis 2009    ROS: Negative except as per HPI  BP 118/62   Pulse 56  Ht 5' 3" (1.6 m)  Wt 185 lb (83.915 kg)  BMI 32.78 kg/m2  SpO2 92%  PHYSICAL EXAM: Pt is alert and oriented, pleasant overweight woman in NAD HEENT: normal Neck: JVP - normal, carotids 2+= with bilateral bruits Lungs: CTA bilaterally CV: RRR without murmur or gallop Abd: soft, NT, Positive BS, no hepatomegaly Ext: no C/C/E, distal pulses intact and equal Skin: warm/dry no rash  EKG:  Sinus bradycardia 56 beats per minute, borderline criteria for LVH maybe normal variant, anterolateral T wave abnormality consider ischemia. ST and T wave changes are more pronounced than last years tracing.  2-D echocardiogram: Left ventricle: The cavity size was normal. Wall thickness was increased in a pattern of mild LVH. Systolic function was normal. The estimated ejection fraction was in the range of 55% to 60%. Images were inadequate for LV wall motion assessment. Findings consistent with left ventricular diastolic dysfunction, indeterminate grade.  ------------------------------------------------------------ Aortic valve: Not well visualized. Uncertain number of leaflets. Doppler: There was no stenosis. No significant regurgitation. VTI ratio of LVOT to aortic valve: 0.82. Valve area: 1.86cm^2(VTI). Indexed valve area: 1cm^2/m^2 (VTI). Peak velocity ratio of LVOT to aortic valve: 0.8. Valve area: 1.81cm^2 (Vmax). Indexed valve area: 0.97cm^2/m^2 (Vmax).  ------------------------------------------------------------ Aorta: Aortic root: The aortic root was normal in size.  ------------------------------------------------------------ Mitral valve: Not well visualized. The valve appears to be grossly normal. Doppler: There was no evidence for stenosis. Mild regurgitation. Peak gradient: 5mm Hg (D).  ------------------------------------------------------------ Left atrium: The atrium was severely dilated.  ------------------------------------------------------------ Pulmonary  veins: Not well visualized.  ------------------------------------------------------------ Right ventricle: The cavity size was normal. Wall thickness was normal. Systolic function was normal. RV TAPSE is 1.8 cm.  ------------------------------------------------------------ Pulmonic valve: Not well visualized. The valve appears to be grossly normal. Doppler: There was no evidence for stenosis. No significant regurgitation.  ------------------------------------------------------------ Tricuspid valve: Structurally normal valve. Leaflet separation was normal. Doppler: Transvalvular velocity was within the normal range. No regurgitation.  ------------------------------------------------------------ Pulmonary artery: Systolic pressure could not be accurately estimated, insufficient TR jet.  ------------------------------------------------------------ Right atrium: The atrium was normal in size.  ------------------------------------------------------------ Pericardium: There was no pericardial effusion.  ------------------------------------------------------------ Systemic veins: Inferior vena cava: The vessel was normal in size; the respirophasic diameter changes were in the normal range (= 50%); findings are consistent with normal central venous pressure.  ASSESSMENT AND PLAN: 1. Coronary artery disease, native vessel. The patient describes CCS class III anginal symptoms, now occurring in a crescendo pattern. She also has EKG changes suggestive of ischemia. While she did have some recurrence of   chest pain in 2012 and her heart catheterization was reassuring, I think a repeat cardiac catheterization is clearly indicated in this patient with progressive symptoms. She is extremely limited by anginal symptoms and shortness of breath at the present time. An echocardiogram has demonstrated normal left ventricular size and systolic function with a moderate level of diastolic dysfunction. The  patient's blood pressure and heart rate are well controlled. I am going to add isosorbide 30 mg daily. Also will load her with clopidogrel 300 mg followed by 75 mg daily in anticipation of PCI. I have reviewed the risks, indications, and alternatives to cardiac catheterization and possible PCI with the patient who understands and agrees to proceed.  2. Hypertension. Blood pressure is well controlled on current medical therapy.  3. Hyperlipidemia. Patient is treated with pravastatin.  [name] 11/01/2012 11:04 AM      

## 2012-11-05 ENCOUNTER — Encounter (HOSPITAL_COMMUNITY)
Admission: RE | Disposition: A | Discharge status: Home / Self Care | Payer: Self-pay | Source: Ambulatory Visit | Attending: Cardiovascular Disease

## 2012-11-05 ENCOUNTER — Ambulatory Visit (HOSPITAL_COMMUNITY)
Admission: RE | Admit: 2012-11-05 | Discharge: 2012-11-05 | Disposition: A | Discharge status: Home / Self Care | Payer: Medicare Other | Source: Ambulatory Visit | Attending: Cardiovascular Disease | Admitting: Cardiovascular Disease

## 2012-11-05 DIAGNOSIS — J449 Chronic obstructive pulmonary disease, unspecified: Secondary | ICD-10-CM | POA: Insufficient documentation

## 2012-11-05 DIAGNOSIS — I10 Essential (primary) hypertension: Secondary | ICD-10-CM | POA: Diagnosis not present

## 2012-11-05 DIAGNOSIS — I252 Old myocardial infarction: Secondary | ICD-10-CM | POA: Insufficient documentation

## 2012-11-05 DIAGNOSIS — Z9861 Coronary angioplasty status: Secondary | ICD-10-CM | POA: Diagnosis not present

## 2012-11-05 DIAGNOSIS — I200 Unstable angina: Secondary | ICD-10-CM | POA: Diagnosis not present

## 2012-11-05 DIAGNOSIS — J4489 Other specified chronic obstructive pulmonary disease: Secondary | ICD-10-CM | POA: Insufficient documentation

## 2012-11-05 DIAGNOSIS — I428 Other cardiomyopathies: Secondary | ICD-10-CM | POA: Diagnosis not present

## 2012-11-05 DIAGNOSIS — Z79899 Other long term (current) drug therapy: Secondary | ICD-10-CM | POA: Insufficient documentation

## 2012-11-05 DIAGNOSIS — I2510 Atherosclerotic heart disease of native coronary artery without angina pectoris: Secondary | ICD-10-CM | POA: Insufficient documentation

## 2012-11-05 HISTORY — PX: LEFT HEART CATHETERIZATION WITH CORONARY ANGIOGRAM: SHX5451

## 2012-11-05 SURGERY — LEFT HEART CATHETERIZATION WITH CORONARY ANGIOGRAM
Anesthesia: LOCAL

## 2012-11-05 MED ORDER — LIDOCAINE HCL (PF) 1 % IJ SOLN
INTRAMUSCULAR | Status: AC
  Filled 2012-11-05: qty 30

## 2012-11-05 MED ORDER — SODIUM CHLORIDE 0.9 % IV SOLN: 1.0000 mL/kg/h | INTRAVENOUS | Status: DC

## 2012-11-05 MED ORDER — MIDAZOLAM HCL 2 MG/2ML IJ SOLN
INTRAMUSCULAR | Status: AC
  Filled 2012-11-05: qty 2

## 2012-11-05 MED ORDER — ONDANSETRON HCL 4 MG/2ML IJ SOLN: 4.0000 mg | Freq: Four times a day (QID) | INTRAMUSCULAR | Status: DC | PRN

## 2012-11-05 MED ORDER — SODIUM CHLORIDE 0.9 % IJ SOLN: 3.0000 mL | Freq: Two times a day (BID) | INTRAMUSCULAR | Status: DC

## 2012-11-05 MED ORDER — SODIUM CHLORIDE 0.9 % IV SOLN: 250.0000 mL | INTRAVENOUS | Status: DC | PRN

## 2012-11-05 MED ORDER — FENTANYL CITRATE 0.05 MG/ML IJ SOLN
INTRAMUSCULAR | Status: AC
  Filled 2012-11-05: qty 2

## 2012-11-05 MED ORDER — NITROGLYCERIN 0.2 MG/ML ON CALL CATH LAB
INTRAVENOUS | Status: AC
  Filled 2012-11-05: qty 1

## 2012-11-05 MED ORDER — ASPIRIN 81 MG PO CHEW
324.0000 mg | CHEWABLE_TABLET | Freq: Once | ORAL | Status: AC
  Administered 2012-11-05: 324 mg via ORAL
  Filled 2012-11-05: qty 4

## 2012-11-05 MED ORDER — HEPARIN SODIUM (PORCINE) 1000 UNIT/ML IJ SOLN
INTRAMUSCULAR | Status: AC
  Filled 2012-11-05: qty 1

## 2012-11-05 MED ORDER — HEPARIN (PORCINE) IN NACL 2-0.9 UNIT/ML-% IJ SOLN
INTRAMUSCULAR | Status: AC
  Filled 2012-11-05: qty 1500

## 2012-11-05 MED ORDER — SODIUM CHLORIDE 0.9 % IV SOLN
INTRAVENOUS | Status: DC
  Administered 2012-11-05: 75 mL/h via INTRAVENOUS

## 2012-11-05 MED ORDER — VERAPAMIL HCL 2.5 MG/ML IV SOLN
INTRAVENOUS | Status: AC
  Filled 2012-11-05: qty 2

## 2012-11-05 MED ORDER — SODIUM CHLORIDE 0.9 % IJ SOLN: 3.0000 mL | INTRAMUSCULAR | Status: DC | PRN

## 2012-11-05 MED ORDER — ACETAMINOPHEN 325 MG PO TABS: 650.0000 mg | ORAL_TABLET | ORAL | Status: DC | PRN

## 2012-11-05 NOTE — Interval H&P Note (Signed)
History and Physical Interval Note:  11/05/2012 8:41 AM  [name]  has presented today for surgery, with the diagnosis of cad  The various methods of treatment have been discussed with the patient and family. After consideration of risks, benefits and other options for treatment, the patient has consented to  Procedure(s): LEFT HEART CATHETERIZATION WITH CORONARY ANGIOGRAM (N/A) as a surgical intervention .  The patient's history has been reviewed, patient examined, no change in status, stable for surgery.  I have reviewed the patient's chart and labs.  Questions were answered to the patient's satisfaction.    Cath Lab Visit (complete for each Cath Lab visit)  Clinical Evaluation Leading to the Procedure:   ACS: no  Non-ACS:    Anginal Classification: CCS III  Anti-ischemic medical therapy: Maximal Therapy (2 or more classes of medications)  Non-Invasive Test Results: No non-invasive testing performed  Prior CABG: No previous CABG         Tonny Bollman

## 2012-11-05 NOTE — H&P (View-Only) (Signed)
HPI:   77 year old woman presenting for followup evaluation. The patient is followed for coronary artery disease. Her last heart catheterization was in 2012 which demonstrated continued patency of her LAD stent and minor nonobstructive disease elsewhere.  [name] returns early for followup because she is having progressive anginal symptoms. She describes a heaviness and pressure-like sensation in her chest over the last 3-4 weeks, this began with moderate level of exertion, but is now occurring with minimal exertion. She is having no chest pain at rest. However, she describes episodes of pain even with walking around on one level in her house. She has only taken nitroglycerin once and this helped her pain. She has associated dyspnea. She denies orthopnea, PND, or edema. She's had no palpitations, lightheadedness, diaphoresis, nausea or vomiting, or syncope. The patient's chest pain is described as a heaviness and pressure-like sensation and it occurs in the center of her chest. It is relieved with rest within 5 minutes. There is no pain radiation.  Outpatient Encounter Prescriptions as of 11/01/2012  Medication Sig Dispense Refill  . albuterol (PROVENTIL HFA;VENTOLIN HFA) 108 (90 BASE) MCG/ACT inhaler Inhale 2 puffs into the lungs every 6 (six) hours as needed for wheezing.  1 Inhaler  6  . alendronate (FOSAMAX) 70 MG tablet TAKE 1 TABLET BY MOUTH EVERY WEEK AS DIRECTED  12 tablet  0  . Alpha-D-Galactosidase (BEANO) TABS Take 2 tablets by mouth at bedtime.      Marland Kitchen amLODipine (NORVASC) 5 MG tablet TAKE 1 TABLET BY MOUTH EVERY MORNING  90 tablet  0  . aspirin 81 MG EC tablet Take 81 mg by mouth every morning.       . cetirizine (ZYRTEC) 10 MG tablet Take 10 mg by mouth daily as needed for allergies.      . citalopram (CELEXA) 40 MG tablet TAKE 1 TABLET BY MOUTH EVERY DAY  90 tablet  0  . donepezil (ARICEPT) 10 MG tablet Take 10 mg by mouth at bedtime.       Marland Kitchen erythromycin ophthalmic ointment USE IN  EYE AS DIRECTED  3.5 g  0  . fluticasone (FLONASE) 50 MCG/ACT nasal spray Place 2 sprays into the nose daily as needed for rhinitis or allergies.       . furosemide (LASIX) 20 MG tablet Take 1 tablet (20 mg total) by mouth daily.  45 tablet  2  . ibuprofen (ADVIL,MOTRIN) 200 MG tablet Take 400 mg by mouth daily as needed for pain.      Marland Kitchen lansoprazole (PREVACID) 15 MG capsule Take 15 mg by mouth daily as needed (FOR ACID REFLUX/GERD).      . LORazepam (ATIVAN) 1 MG tablet TAKE 1/2 TO 1 TABLET BY MOUTH EVERY NIGHT AT BEDTIME AS NEEDED  30 tablet  1  . losartan (COZAAR) 100 MG tablet TAKE ONE TABLET BY MOUTH D-REPLACES BENICAR  90 tablet  0  . mirtazapine (REMERON) 15 MG tablet TAKE 1/2 TABLET BY MOUTH EVERY NIGHT AT BEDTIME  15 tablet  0  . nitroGLYCERIN (NITROSTAT) 0.4 MG SL tablet Place 0.4 mg under the tongue as needed for chest pain.       . pravastatin (PRAVACHOL) 40 MG tablet TAKE 1 TABLET BY MOUTH EVERY DAY  90 tablet  0  . tiotropium (SPIRIVA) 18 MCG inhalation capsule Place 1 capsule (18 mcg total) into inhaler and inhale daily.  30 capsule  1   No facility-administered encounter medications on file as of 11/01/2012.    Allergies  Allergen Reactions  . Acetaminophen   . Codeine Nausea And Vomiting  . Dilaudid [Hydromorphone Hcl] Other (See Comments)    Extremely sensitive  . Erythromycin Other (See Comments)    Intense stomach pain  . Sulfonamide Derivatives     Past Medical History  Diagnosis Date  . CAD (coronary artery disease) 08/2009    s/p PCI of the LAD  . Myocardial infarction     Non-st segment elevated  . Hypertension   . Hyperlipidemia   . Bradycardia   . Carotid artery stenosis   . COPD (chronic obstructive pulmonary disease)   . Chronic headache   . Asthma   . Pneumonia   . Ulnar neuropathy   . Seasonal allergies   . Impaired fasting glucose   . Dementia     patient denies this  . Osteoporosis 2009    ROS: Negative except as per HPI  BP 118/62   Pulse 56  Ht 5\' 3"  (1.6 m)  Wt 185 lb (83.915 kg)  BMI 32.78 kg/m2  SpO2 92%  PHYSICAL EXAM: Pt is alert and oriented, pleasant overweight woman in NAD HEENT: normal Neck: JVP - normal, carotids 2+= with bilateral bruits Lungs: CTA bilaterally CV: RRR without murmur or gallop Abd: soft, NT, Positive BS, no hepatomegaly Ext: no C/C/E, distal pulses intact and equal Skin: warm/dry no rash  EKG:  Sinus bradycardia 56 beats per minute, borderline criteria for LVH maybe normal variant, anterolateral T wave abnormality consider ischemia. ST and T wave changes are more pronounced than last years tracing.  2-D echocardiogram: Left ventricle: The cavity size was normal. Wall thickness was increased in a pattern of mild LVH. Systolic function was normal. The estimated ejection fraction was in the range of 55% to 60%. Images were inadequate for LV wall motion assessment. Findings consistent with left ventricular diastolic dysfunction, indeterminate grade.  ------------------------------------------------------------ Aortic valve: Not well visualized. Uncertain number of leaflets. Doppler: There was no stenosis. No significant regurgitation. VTI ratio of LVOT to aortic valve: 0.82. Valve area: 1.86cm^2(VTI). Indexed valve area: 1cm^2/m^2 (VTI). Peak velocity ratio of LVOT to aortic valve: 0.8. Valve area: 1.81cm^2 (Vmax). Indexed valve area: 0.97cm^2/m^2 (Vmax).  ------------------------------------------------------------ Aorta: Aortic root: The aortic root was normal in size.  ------------------------------------------------------------ Mitral valve: Not well visualized. The valve appears to be grossly normal. Doppler: There was no evidence for stenosis. Mild regurgitation. Peak gradient: 5mm Hg (D).  ------------------------------------------------------------ Left atrium: The atrium was severely dilated.  ------------------------------------------------------------ Pulmonary  veins: Not well visualized.  ------------------------------------------------------------ Right ventricle: The cavity size was normal. Wall thickness was normal. Systolic function was normal. RV TAPSE is 1.8 cm.  ------------------------------------------------------------ Pulmonic valve: Not well visualized. The valve appears to be grossly normal. Doppler: There was no evidence for stenosis. No significant regurgitation.  ------------------------------------------------------------ Tricuspid valve: Structurally normal valve. Leaflet separation was normal. Doppler: Transvalvular velocity was within the normal range. No regurgitation.  ------------------------------------------------------------ Pulmonary artery: Systolic pressure could not be accurately estimated, insufficient TR jet.  ------------------------------------------------------------ Right atrium: The atrium was normal in size.  ------------------------------------------------------------ Pericardium: There was no pericardial effusion.  ------------------------------------------------------------ Systemic veins: Inferior vena cava: The vessel was normal in size; the respirophasic diameter changes were in the normal range (= 50%); findings are consistent with normal central venous pressure.  ASSESSMENT AND PLAN: 1. Coronary artery disease, native vessel. The patient describes CCS class III anginal symptoms, now occurring in a crescendo pattern. She also has EKG changes suggestive of ischemia. While she did have some recurrence of  chest pain in 2012 and her heart catheterization was reassuring, I think a repeat cardiac catheterization is clearly indicated in this patient with progressive symptoms. She is extremely limited by anginal symptoms and shortness of breath at the present time. An echocardiogram has demonstrated normal left ventricular size and systolic function with a moderate level of diastolic dysfunction. The  patient's blood pressure and heart rate are well controlled. I am going to add isosorbide 30 mg daily. Also will load her with clopidogrel 300 mg followed by 75 mg daily in anticipation of PCI. I have reviewed the risks, indications, and alternatives to cardiac catheterization and possible PCI with the patient who understands and agrees to proceed.  2. Hypertension. Blood pressure is well controlled on current medical therapy.  3. Hyperlipidemia. Patient is treated with pravastatin.  Tonny Bollman 11/01/2012 11:04 AM

## 2012-11-05 NOTE — CV Procedure (Signed)
   Cardiac Catheterization Procedure Note  Name: [name] MRN: [id number] DOB: 21-May-1935  Procedure: Left Heart Cath, Selective Coronary Angiography, LV angiography  Indication: CCS class III angina on maximal medical therapy. [name] has known CAD. She has undergone stenting of the LAD in 2011.   Procedural Details: The right wrist was prepped, draped, and anesthetized with 1% lidocaine. Using the modified Seldinger technique, a 5 French sheath was introduced into the right radial artery. 3 mg of verapamil was administered through the sheath, weight-based unfractionated heparin was administered intravenously. Standard Judkins catheters were used for selective coronary angiography and left ventriculography. Catheter exchanges were performed over an exchange length guidewire. There were no immediate procedural complications. A TR band was used for radial hemostasis at the completion of the procedure.  The patient was transferred to the post catheterization recovery area for further monitoring.  Procedural Findings: Hemodynamics: AO 124/50 with a mean of 76 LV 143/80  Coronary angiography: Coronary dominance: right  Left mainstem: The left main is short. The vessel is widely patent and arises from the left cusp. It divides into the LAD and left circumflex.  Left anterior descending (LAD): The LAD has minor irregularity. The vessel is patent to the left ventricular apex. The stented segment mid LAD is widely patent without significant in-stent restenosis. There is no obstructive disease throughout the distribution of the LAD. There are 3 patent diagonal branches without obstructive disease.  Left circumflex (LCx): The left circumflex is a large, dominant vessel. There is diffuse luminal irregularity. There were 2 obtuse marginal branches, a left posterolateral branch, and the left PDA.  Right coronary artery (RCA): The RCA is nondominant. The vessel is moderately  calcified. There is mild proximal 20-30% stenosis.  Left ventriculography: Left ventricular systolic function is vigorous, with an estimated LVEF of 65-70%. There is marked hypertrophy of the left ventricular apex with obliteration of the distal left ventricular cavity.   Final Conclusions:   1. Continued patency of the stented segment in the LAD 2. Widely patent, dominant left circumflex 3. 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 The patient does have a 20 mm pressure gradient without known aortic valve disease. Will check a cardiac MRI to rule out LV apical scarring and to further evaluate her cardiomyopathy.  Tonny Bollman 11/05/2012, 9:30 AM

## 2012-11-08 ENCOUNTER — Other Ambulatory Visit: Payer: Self-pay | Admitting: Family Medicine

## 2012-11-19 ENCOUNTER — Telehealth: Payer: Self-pay | Admitting: Cardiovascular Disease

## 2012-11-19 DIAGNOSIS — I429 Cardiomyopathy, unspecified: Secondary | ICD-10-CM

## 2012-11-19 NOTE — Telephone Encounter (Signed)
New Problem:  Pt's grand daughter, Kennyth Arnold, states she wants to know if her grandmother needs a cardiac MRI? Kennyth Arnold states her grandmother told her she was suppose to schedule one. However, I do not see an order for one. Could you please clarify. Please call patient to let them know. Kennyth Arnold stated her grandmother had dementia and may be confused.

## 2012-11-19 NOTE — Telephone Encounter (Signed)
[name] - can you set her up for a cardiac MRI and follow-up visit with me. Neither are urgent. thx Kathlene November

## 2012-11-19 NOTE — Telephone Encounter (Signed)
Order has been placed for Cardiac MRI. I left a message on [name]'s voicemail that we will contact the pt with appointment information once Cardiac MRI is scheduled.

## 2012-11-25 ENCOUNTER — Other Ambulatory Visit: Payer: Self-pay | Admitting: Family Medicine

## 2012-11-27 ENCOUNTER — Encounter: Payer: Self-pay | Admitting: Family Medicine

## 2012-11-27 ENCOUNTER — Other Ambulatory Visit: Payer: Self-pay | Admitting: Family Medicine

## 2012-11-27 ENCOUNTER — Encounter: Payer: Self-pay | Admitting: Cardiovascular Disease

## 2012-11-27 NOTE — Telephone Encounter (Signed)
Pt scheduled for Cardiac MRI on 12/28/12.

## 2012-11-27 NOTE — Telephone Encounter (Signed)
3 refills , standard OV by Dec

## 2012-11-27 NOTE — Telephone Encounter (Signed)
Last office visit 10/22/12 for sick visit

## 2012-11-29 ENCOUNTER — Ambulatory Visit (INDEPENDENT_AMBULATORY_CARE_PROVIDER_SITE_OTHER): Payer: Medicare Other

## 2012-11-29 DIAGNOSIS — Z23 Encounter for immunization: Secondary | ICD-10-CM

## 2012-12-06 ENCOUNTER — Encounter: Payer: Self-pay | Admitting: Family Medicine

## 2012-12-06 ENCOUNTER — Ambulatory Visit (INDEPENDENT_AMBULATORY_CARE_PROVIDER_SITE_OTHER): Payer: Medicare Other | Admitting: Family Medicine

## 2012-12-06 VITALS — BP 150/98 | Temp 98.4°F | Ht 63.0 in | Wt 190.6 lb

## 2012-12-06 DIAGNOSIS — J019 Acute sinusitis, unspecified: Secondary | ICD-10-CM

## 2012-12-06 MED ORDER — AMOXICILLIN 500 MG PO CAPS: 500.0000 mg | ORAL_CAPSULE | Freq: Three times a day (TID) | ORAL | Status: DC

## 2012-12-06 NOTE — Progress Notes (Signed)
  Subjective:    Patient ID: [name], female    DOB: 01/08/1936, 77 y.o.   MRN: [id number]  Cough This is a new problem. The current episode started in the past 7 days. The cough is productive of sputum. Associated symptoms include ear congestion, a fever, headaches, nasal congestion, rhinorrhea, a sore throat and wheezing. Nothing aggravates the symptoms. Treatments tried: IBU. The treatment provided mild relief.      Review of Systems  Constitutional: Positive for fever.  HENT: Positive for rhinorrhea and sore throat.   Respiratory: Positive for cough and wheezing.   Neurological: Positive for headaches.       Objective:   Physical Exam        Assessment & Plan:   Subjective:     [name] is a 77 y.o. female who presents for evaluation of sinus pain. Symptoms include: congestion, cough, facial pain, foul rhinorrhea, frequent clearing of the throat, headaches, mouth breathing, nasal congestion, puffiness of the eyes, purulent rhinorrhea, sinus pressure, sniffing and sore throat. Onset of symptoms was 5 days ago. Symptoms have been gradually worsening since that time. Past history is significant for COPD. Patient is a non-smoker.  The following portions of the patient's history were reviewed and updated as appropriate: allergies, current medications, past family history, past medical history, past social history, past surgical history and problem list.  Review of Systems Constitutional: negative   Objective:    BP 150/98  Temp(Src) 98.4 F (36.9 C)  Ht 5\' 3"  (1.6 m)  Wt 190 lb 9.6 oz (86.456 kg)  BMI 33.77 kg/m2  General Appearance:    Alert, cooperative, no distress, appears stated age  Head:    Normocephalic, without obvious abnormality, atraumatic  Eyes:    PERRL, conjunctiva/corneas clear, EOM's intact, fundi    benign, both eyes  Ears:    Normal TM's and external ear canals, both ears  Nose:   Nares normal, septum midline, mucosa normal, no  drainage    or sinus tenderness  Throat:   Lips, mucosa, and tongue normal; teeth and gums normal  Neck:   Supple, symmetrical, trachea midline, no adenopathy;    thyroid:  no enlargement/tenderness/nodules; no carotid   bruit or JVD  Back:     Symmetric, no curvature, ROM normal, no CVA tenderness  Lungs:     Clear to auscultation bilaterally, respirations unlabored  Chest Wall:    No tenderness or deformity   Heart:    Regular rate and rhythm, S1 and S2 normal, no murmur, rub   or gallop       Assessment:    Acute bacterial sinusitis.    Plan:    Nasal saline sprays.  Antibiotics prescribed warning signs discussed followup if ongoing troubles

## 2012-12-06 NOTE — Patient Instructions (Addendum)
   Use the Amoxil, one 3 times a day for next 10 days. If you are not improving by Monday call-we may need to try different medicine. Also use inhaler as needed ( you had a slight wheeze today ) call us sooner if problems      Sinusitis Sinusitis is redness, soreness, and puffiness (inflammation) of the air pockets in the bones of your face (sinuses). The redness, soreness, and puffiness can cause air and mucus to get trapped in your sinuses. This can allow germs to grow and cause an infection.  HOME CARE   Drink enough fluids to keep your pee (urine) clear or pale yellow.  Use a humidifier in your home.  Run a hot shower to create steam in the bathroom. Sit in the bathroom with the door closed. Breathe in the steam 3 4 times a day.  Put a warm, moist washcloth on your face 3 4 times a day, or as told by your doctor.  Use salt water sprays (saline sprays) to wet the thick fluid in your nose. This can help the sinuses drain.  Only take medicine as told by your doctor. GET HELP RIGHT AWAY IF:   Your pain gets worse.  You have very bad headaches.  You are sick to your stomach (nauseous).  You throw up (vomit).  You are very sleepy (drowsy) all the time.  Your face is puffy (swollen).  Your vision changes.  You have a stiff neck.  You have trouble breathing. MAKE SURE YOU:   Understand these instructions.  Will watch your condition.  Will get help right away if you are not doing well or get worse. Document Released: 07/20/2007 Document Revised: 10/26/2011 Document Reviewed: 09/06/2011 [facility] Patient Information 2014 [place], [place].

## 2012-12-07 ENCOUNTER — Ambulatory Visit: Payer: Medicare Other | Admitting: Cardiovascular Disease

## 2012-12-13 ENCOUNTER — Other Ambulatory Visit: Payer: Self-pay | Admitting: *Deleted

## 2012-12-13 ENCOUNTER — Telehealth: Payer: Self-pay | Admitting: Family Medicine

## 2012-12-13 MED ORDER — LEVOFLOXACIN 500 MG PO TABS: 500.0000 mg | ORAL_TABLET | Freq: Every day | ORAL | Status: DC

## 2012-12-13 NOTE — Telephone Encounter (Signed)
Patient is having continued cough, sore throat and getting chest congestion. She was seen last week for this. Please advise.    [facility]

## 2012-12-13 NOTE — Telephone Encounter (Signed)
Levaquin 500 milligram 1 daily 7 days

## 2012-12-13 NOTE — Telephone Encounter (Signed)
Discussed with pt. Med sent to pharm.  

## 2012-12-13 NOTE — Telephone Encounter (Signed)
Levaquin 500 milligram 1 daily for 7 days

## 2012-12-25 ENCOUNTER — Other Ambulatory Visit: Payer: Self-pay | Admitting: Family Medicine

## 2012-12-27 ENCOUNTER — Other Ambulatory Visit: Payer: Self-pay | Admitting: Family Medicine

## 2012-12-28 ENCOUNTER — Ambulatory Visit (HOSPITAL_COMMUNITY)
Admission: RE | Admit: 2012-12-28 | Discharge: 2012-12-28 | Disposition: A | Discharge status: Home / Self Care | Payer: Medicare Other | Source: Ambulatory Visit | Attending: Cardiovascular Disease | Admitting: Cardiovascular Disease

## 2012-12-28 ENCOUNTER — Ambulatory Visit (HOSPITAL_COMMUNITY): Admission: RE | Admit: 2012-12-28 | Payer: Medicare Other | Source: Ambulatory Visit

## 2012-12-28 DIAGNOSIS — I422 Other hypertrophic cardiomyopathy: Secondary | ICD-10-CM | POA: Insufficient documentation

## 2012-12-28 DIAGNOSIS — I421 Obstructive hypertrophic cardiomyopathy: Secondary | ICD-10-CM | POA: Diagnosis not present

## 2012-12-28 DIAGNOSIS — I429 Cardiomyopathy, unspecified: Secondary | ICD-10-CM

## 2012-12-28 LAB — CREATININE, SERUM
GFR calc Af Amer: 60 mL/min — ABNORMAL LOW (ref >90)
GFR calc non Af Amer: 52 mL/min — ABNORMAL LOW (ref >90)

## 2012-12-28 MED ORDER — GADOBENATE DIMEGLUMINE 529 MG/ML IV SOLN
30.0000 mL | Freq: Once | INTRAVENOUS | Status: AC
  Administered 2012-12-28: 26 mL via INTRAVENOUS

## 2013-01-07 ENCOUNTER — Other Ambulatory Visit: Payer: Self-pay | Admitting: Family Medicine

## 2013-01-09 ENCOUNTER — Ambulatory Visit (INDEPENDENT_AMBULATORY_CARE_PROVIDER_SITE_OTHER): Payer: Medicare Other | Admitting: Cardiovascular Disease

## 2013-01-09 ENCOUNTER — Encounter: Payer: Self-pay | Admitting: Cardiovascular Disease

## 2013-01-09 VITALS — BP 130/70 | HR 58 | Ht 63.0 in | Wt 178.0 lb

## 2013-01-09 DIAGNOSIS — I421 Obstructive hypertrophic cardiomyopathy: Secondary | ICD-10-CM

## 2013-01-09 DIAGNOSIS — I2510 Atherosclerotic heart disease of native coronary artery without angina pectoris: Secondary | ICD-10-CM | POA: Diagnosis not present

## 2013-01-09 MED ORDER — METOPROLOL TARTRATE 25 MG PO TABS: 25.0000 mg | ORAL_TABLET | Freq: Two times a day (BID) | ORAL | Status: DC

## 2013-01-09 NOTE — Progress Notes (Signed)
HPI:  77 year old woman presenting for followup evaluation. She was last seen in September 2014, at which time she complained of progressive anginal symptoms. The patient has known coronary artery disease and underwent stenting of the LAD in 2011. Her recent heart catheterization demonstrating continued patency of the LAD stent. Left ventricular function was vigorous with a pattern of marked apical hypertrophy raising concern for apical hypertrophic cardiomyopathy.  The patient returns today for followup discussion. She has undergone a cardiac MRI. She continues to be limited by exertional dyspnea. She's had no recent chest pain or pressure. She denies lightheadedness, palpitations, or syncope. Dyspnea occurs with low-level activity such as walking less than one block. She is very sedentary.  Outpatient Encounter Prescriptions as of 01/09/2013  Medication Sig  . albuterol (PROVENTIL HFA;VENTOLIN HFA) 108 (90 BASE) MCG/ACT inhaler Inhale 2 puffs into the lungs every 6 (six) hours as needed for wheezing.  Marland Kitchen alendronate (FOSAMAX) 70 MG tablet Take 70 mg by mouth every 7 (seven) days. Take with a full glass of water on an empty stomach.  . Alpha-D-Galactosidase (BEANO) TABS Take 2 tablets by mouth 3 times/day as needed-between meals & bedtime.   Marland Kitchen amLODipine (NORVASC) 5 MG tablet TAKE 1 TABLET BY MOUTH EVERY MORNING  . aspirin 81 MG EC tablet Take 81 mg by mouth every morning.   . cetirizine (ZYRTEC) 10 MG tablet Take 10 mg by mouth daily as needed for allergies.  . Cholecalciferol (VITAMIN D3) 2000 UNITS TABS Take 1 tablet by mouth every morning.  . citalopram (CELEXA) 40 MG tablet TAKE 1 TABLET BY MOUTH EVERY DAY  . donepezil (ARICEPT) 10 MG tablet Take 10 mg by mouth at bedtime.   Marland Kitchen erythromycin ophthalmic ointment Place 1 application into both eyes daily.  . furosemide (LASIX) 20 MG tablet TAKE 1 TABLET BY MOUTH DAILY  . ibuprofen (ADVIL,MOTRIN) 200 MG tablet Take 400 mg by mouth daily as  needed for pain.  . isosorbide mononitrate (IMDUR) 30 MG 24 hr tablet Take 1 tablet (30 mg total) by mouth daily.  . lansoprazole (PREVACID) 15 MG capsule Take 15 mg by mouth daily as needed (FOR ACID REFLUX/GERD).  . LORazepam (ATIVAN) 1 MG tablet TAKE 1/2 TO 1 TABLET BY MOUTH EVERY NIGHT AT BEDTIME AS NEEDED  . losartan (COZAAR) 100 MG tablet TAKE 1 TABLET BY MOUTH DAILY*THIS REPLACES BENICAR*  . mirtazapine (REMERON) 15 MG tablet TAKE 1/2 TABLET BY MOUTH EVERY NIGHT AT BEDTIME  . nitroGLYCERIN (NITROSTAT) 0.4 MG SL tablet Place 0.4 mg under the tongue as needed for chest pain.   . pravastatin (PRAVACHOL) 40 MG tablet TAKE 1 TABLET BY MOUTH EVERY DAY  . [DISCONTINUED] levofloxacin (LEVAQUIN) 500 MG tablet Take 1 tablet (500 mg total) by mouth daily.  . [DISCONTINUED] tiotropium (SPIRIVA) 18 MCG inhalation capsule Place 1 capsule (18 mcg total) into inhaler and inhale daily.  . [DISCONTINUED] alendronate (FOSAMAX) 70 MG tablet TAKE 1 TABLET BY MOUTH EVERY WEEK AS DIRECTED  . [DISCONTINUED] amoxicillin (AMOXIL) 500 MG capsule Take 1 capsule (500 mg total) by mouth 3 (three) times daily.  . [DISCONTINUED] clopidogrel (PLAVIX) 75 MG tablet Take 1 tablet (75 mg total) by mouth daily.  . [DISCONTINUED] fluticasone (FLONASE) 50 MCG/ACT nasal spray Place 2 sprays into the nose daily as needed for rhinitis or allergies.     Allergies  Allergen Reactions  . Acetaminophen   . Codeine Nausea And Vomiting  . Dilaudid [Hydromorphone Hcl] Other (See Comments)    Extremely  sensitive  . Erythromycin Other (See Comments)    Intense stomach pain  . Sulfonamide Derivatives     Past Medical History  Diagnosis Date  . CAD (coronary artery disease) 08/2009    s/p PCI of the LAD  . Myocardial infarction     Non-st segment elevated  . Hypertension   . Hyperlipidemia   . Bradycardia   . Carotid artery stenosis   . COPD (chronic obstructive pulmonary disease)   . Chronic headache   . Asthma   .  Pneumonia   . Ulnar neuropathy   . Seasonal allergies   . Impaired fasting glucose   . Dementia     patient denies this  . Osteoporosis 2009    ROS: Negative except as per HPI  BP 130/70  Pulse 58  Ht 5\' 3"  (1.6 m)  Wt 178 lb (80.74 kg)  BMI 31.54 kg/m2  PHYSICAL EXAM: Pt is alert and oriented, NAD HEENT: normal Neck: JVP - normal, carotids 2+= without bruits Lungs: CTA bilaterally CV: RRR with grade 2/6 systolic ejection murmur at the left sternal border Abd: soft, NT, Positive BS, no hepatomegaly Ext: no C/C/E, distal pulses intact and equal Skin: warm/dry no rash  Cardiac MRI 12/28/2012: FINDINGS: Limited views of the lung fields showed no gross abnormalities. There was prominent epicardial adipose tissue.  The left ventricle was normal in size. There was an unusual pattern of hypertrophy. The anteroseptal wall (23 mm) and the anterior wall (27 mm) were severely hypertrophied from base to apex. There apex and peri-apical segments were severely hypertrophied. The basal to mid inferior/inferoseptal walls (13 mm) and the inferolateral wall (7 mm) were not significantly hypertrophied. EF was 65% with normal wall motion. The right ventricle was normal size and systolic function. Mild left atrial enlargement, normal right atrial size. We did not get a good view of the aortic valve, cannot rule out bicuspid. The aortic valve was thickened with some turbulence. There was no aortic insufficiency. There was probably mild mitral regurgitation. There was no mitral valve systolic anterior motion. The LV outflow tract was narrowed by the hypertrophied anteroseptal wall.  On delayed enhancement imaging, there was prominent subepicardial enhancement in the basal anterior wall. There was a small area of apical inferior enhancement.  Measurements:  LV EDV 84 mL  LV SV 55 mL  LV EF 65%  IMPRESSION: 1. Unusual hypertrophy pattern involving the anteroseptal wall,  the anterior wall, and the apex and peri-apical segments. Normal LV EF. There was also a non-coronary disease pattern of delayed enhancement in the subepicardial basal anterior wall and the apical inferior wall. There was a narrowed LV outflow tract without mitral valve systolic anterior motion. These findings suggest a variant of hypertrophic cardiomyopathy.  2. Normal RV size and systolic function.  Marca Ancona  Cardiac catheterization 11/05/2012: Procedural Findings:   Hemodynamics:  AO 124/50 with a mean of 76  LV 143/80  Coronary angiography:  Coronary dominance: right  Left mainstem: The left main is short. The vessel is widely patent and arises from the left cusp. It divides into the LAD and left circumflex.  Left anterior descending (LAD): The LAD has minor irregularity. The vessel is patent to the left ventricular apex. The stented segment mid LAD is widely patent without significant in-stent restenosis. There is no obstructive disease throughout the distribution of the LAD. There are 3 patent diagonal branches without obstructive disease.  Left circumflex (LCx): The left circumflex is a large, dominant vessel. There is  diffuse luminal irregularity. There were 2 obtuse marginal branches, a left posterolateral branch, and the left PDA.  Right coronary artery (RCA): The RCA is nondominant. The vessel is moderately calcified. There is mild proximal 20-30% stenosis.  Left ventriculography: Left ventricular systolic function is vigorous, with an estimated LVEF of 65-70%. There is marked hypertrophy of the left ventricular apex with obliteration of the distal left ventricular cavity.  Final Conclusions:  1. Continued patency of the stented segment in the LAD  2. Widely patent, dominant left circumflex  3. 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 Coronary atherosclerosis, native vessel. The patient is stable without symptoms of angina. Recent cardiac  catheterization data reviewed with the patient and her family.  2. Hypertrophic cardiomyopathy. Her cardiac MRI was reviewed. There is a narrow LV outflow tract, systolic anterior motion of the mitral valve, and subepicardial enhancement on delayed imaging consistent with a variant of hypertrophic cardiomyopathy. Her isosorbide will be discontinued and she will be started on metoprolol 25 mg twice daily. She has no high-risk clinical features. I advised that her children be screened with echocardiography.  3. Exertional dyspnea. I think this is multifactorial. She is extremely sedentary. I stressed the importance of a graded exercise program with her.  4. Hypertension. Blood pressure is controlled on a combination of amlodipine and losartan. Other medication changes as above.  For followup I will see her back in 6 months unless problems arise in the interim.  Tonny Bollman 01/11/2013 5:54 PM

## 2013-01-09 NOTE — Patient Instructions (Signed)
Your physician has recommended you make the following change in your medication: STOP Isosorbide Mononitrate, START Metoprolol Tartrate 25mg  take one by mouth twice a day  Your physician wants you to follow-up in: 6 MONTHS with Dr Excell Seltzer.  You will receive a reminder letter in the mail two months in advance. If you don't receive a letter, please call our office to schedule the follow-up appointment.

## 2013-01-11 ENCOUNTER — Encounter: Payer: Self-pay | Admitting: Cardiovascular Disease

## 2013-01-15 ENCOUNTER — Other Ambulatory Visit: Payer: Self-pay | Admitting: Family Medicine

## 2013-01-22 ENCOUNTER — Encounter: Payer: Self-pay | Admitting: Family Medicine

## 2013-01-22 ENCOUNTER — Ambulatory Visit (INDEPENDENT_AMBULATORY_CARE_PROVIDER_SITE_OTHER): Payer: Medicare Other | Admitting: Family Medicine

## 2013-01-22 VITALS — BP 118/68 | Temp 99.0°F | Ht 63.5 in | Wt 182.0 lb

## 2013-01-22 DIAGNOSIS — J441 Chronic obstructive pulmonary disease with (acute) exacerbation: Secondary | ICD-10-CM

## 2013-01-22 MED ORDER — PREDNISONE 20 MG PO TABS: ORAL_TABLET | ORAL | Status: AC

## 2013-01-22 MED ORDER — CEFPROZIL 500 MG PO TABS: 500.0000 mg | ORAL_TABLET | Freq: Two times a day (BID) | ORAL | Status: DC

## 2013-01-22 NOTE — Progress Notes (Signed)
   Subjective:    Patient ID: [name], female    DOB: 1935-05-24, 77 y.o.   MRN: [id number]  Shortness of Breath This is a new problem. The current episode started yesterday. Associated symptoms include chest pain and a fever. Associated symptoms comments: cough. Treatments tried: inhaler. The treatment provided moderate relief.  positive cough and wheeze some green phlem HX copd Denies chest pain  Review of Systems  Constitutional: Positive for fever.  Respiratory: Positive for shortness of breath.   Cardiovascular: Positive for chest pain.       Objective:   Physical Exam  Vitals reviewed. Constitutional: She appears well-developed and well-nourished.  HENT:  Head: Normocephalic and atraumatic.  Right Ear: External ear normal.  Left Ear: External ear normal.  Mouth/Throat: Oropharynx is clear and moist.  Neck: Neck supple.  Cardiovascular: Normal rate, regular rhythm and normal heart sounds.   Pulmonary/Chest: Effort normal. She has wheezes.  Musculoskeletal: She exhibits no edema.          Assessment & Plan:  COPD exacerbation- cefzil/pred/albuterol Warnings discussed

## 2013-02-12 ENCOUNTER — Other Ambulatory Visit: Payer: Self-pay | Admitting: Family Medicine

## 2013-02-15 ENCOUNTER — Encounter: Payer: Self-pay | Admitting: Family Medicine

## 2013-02-15 ENCOUNTER — Ambulatory Visit (INDEPENDENT_AMBULATORY_CARE_PROVIDER_SITE_OTHER): Payer: Medicare Other | Admitting: Family Medicine

## 2013-02-15 VITALS — BP 132/86 | Temp 98.3°F | Ht 63.5 in | Wt 182.0 lb

## 2013-02-15 DIAGNOSIS — J441 Chronic obstructive pulmonary disease with (acute) exacerbation: Secondary | ICD-10-CM | POA: Diagnosis not present

## 2013-02-15 MED ORDER — BUDESONIDE-FORMOTEROL FUMARATE 160-4.5 MCG/ACT IN AERO: 2.0000 | INHALATION_SPRAY | Freq: Two times a day (BID) | RESPIRATORY_TRACT | Status: DC

## 2013-02-15 MED ORDER — AMOXICILLIN-POT CLAVULANATE 875-125 MG PO TABS: 1.0000 | ORAL_TABLET | Freq: Two times a day (BID) | ORAL | Status: AC

## 2013-02-15 MED ORDER — PREDNISONE 20 MG PO TABS: ORAL_TABLET | ORAL | Status: AC

## 2013-02-15 NOTE — Telephone Encounter (Signed)
No other info °

## 2013-02-15 NOTE — Progress Notes (Signed)
   Subjective:    Patient ID: [name], female    DOB: 21-Nov-1935, 78 y.o.   MRN: [id number]  HPI Patient arrives with continued wheezing since last visit 01/22/13. Got better but never went away and now is getting worse again. Patient here today with granddaughter. Not having any fevers no vomiting is having some congestion and drainage. PMH COPD symptoms been going on past several days not in respiratory distress  Review of Systems  Constitutional: Negative for fever and activity change.  HENT: Positive for congestion and rhinorrhea. Negative for ear pain.   Eyes: Negative for discharge.  Respiratory: Positive for cough and wheezing. Negative for shortness of breath.   Cardiovascular: Negative for chest pain.       Objective:   Physical Exam  Nursing note and vitals reviewed. Constitutional: She appears well-developed.  HENT:  Head: Normocephalic.  Nose: Nose normal.  Mouth/Throat: Oropharynx is clear and moist. No oropharyngeal exudate.  Neck: Neck supple.  Cardiovascular: Normal rate and normal heart sounds.   No murmur heard. Pulmonary/Chest: Effort normal. She has wheezes.  Overall she is moving air well. There is some wheezing noted.  Lymphadenopathy:    She has no cervical adenopathy.  Skin: Skin is warm and dry.          Assessment & Plan:  COPD flareup-I. don't feel she needs antibiotics currently I did print her a prescription for Augmentin to get filled if this gets worse prednisone taper again is given plus also change to Symbicort stopped Qvar may use rescue inhaler as needed plus Spiriva daily. Followup when necessary keep regular health checkups warning signs discussed.

## 2013-02-20 ENCOUNTER — Other Ambulatory Visit: Payer: Self-pay | Admitting: Family Medicine

## 2013-03-07 ENCOUNTER — Other Ambulatory Visit: Payer: Self-pay | Admitting: Family Medicine

## 2013-03-08 NOTE — Telephone Encounter (Signed)
Ok times 4 

## 2013-03-19 ENCOUNTER — Encounter: Payer: Self-pay | Admitting: Family Medicine

## 2013-03-19 ENCOUNTER — Ambulatory Visit (HOSPITAL_COMMUNITY)
Admission: RE | Admit: 2013-03-19 | Discharge: 2013-03-19 | Disposition: A | Discharge status: Home / Self Care | Payer: Medicare Other | Source: Ambulatory Visit | Attending: Family Medicine | Admitting: Family Medicine

## 2013-03-19 ENCOUNTER — Ambulatory Visit (INDEPENDENT_AMBULATORY_CARE_PROVIDER_SITE_OTHER): Payer: Medicare Other | Admitting: Family Medicine

## 2013-03-19 VITALS — BP 132/70 | Ht 63.0 in | Wt 179.0 lb

## 2013-03-19 DIAGNOSIS — J4489 Other specified chronic obstructive pulmonary disease: Secondary | ICD-10-CM | POA: Diagnosis not present

## 2013-03-19 DIAGNOSIS — J449 Chronic obstructive pulmonary disease, unspecified: Secondary | ICD-10-CM | POA: Diagnosis not present

## 2013-03-19 DIAGNOSIS — W19XXXA Unspecified fall, initial encounter: Secondary | ICD-10-CM | POA: Insufficient documentation

## 2013-03-19 DIAGNOSIS — I2510 Atherosclerotic heart disease of native coronary artery without angina pectoris: Secondary | ICD-10-CM | POA: Insufficient documentation

## 2013-03-19 DIAGNOSIS — S20219A Contusion of unspecified front wall of thorax, initial encounter: Secondary | ICD-10-CM

## 2013-03-19 DIAGNOSIS — J9819 Other pulmonary collapse: Secondary | ICD-10-CM | POA: Diagnosis not present

## 2013-03-19 DIAGNOSIS — J90 Pleural effusion, not elsewhere classified: Secondary | ICD-10-CM | POA: Insufficient documentation

## 2013-03-19 DIAGNOSIS — S20212A Contusion of left front wall of thorax, initial encounter: Secondary | ICD-10-CM

## 2013-03-19 DIAGNOSIS — R51 Headache: Secondary | ICD-10-CM | POA: Diagnosis not present

## 2013-03-19 DIAGNOSIS — J45909 Unspecified asthma, uncomplicated: Secondary | ICD-10-CM | POA: Diagnosis not present

## 2013-03-19 DIAGNOSIS — I219 Acute myocardial infarction, unspecified: Secondary | ICD-10-CM | POA: Diagnosis not present

## 2013-03-19 DIAGNOSIS — R079 Chest pain, unspecified: Secondary | ICD-10-CM | POA: Diagnosis not present

## 2013-03-19 MED ORDER — TRAMADOL HCL 50 MG PO TABS: 50.0000 mg | ORAL_TABLET | Freq: Three times a day (TID) | ORAL | Status: DC | PRN

## 2013-03-19 NOTE — Progress Notes (Signed)
   Subjective:    Patient ID: [name], female    DOB: 1935/12/11, 78 y.o.   MRN: [id number]  HPIFell in the tub last night. Pain under left arm and around side and around left ribs. Hurts to take a deep breath and to lift her left arm.   She states she just lost her balance and hit herself. She denies passing out. Denies any nausea vomiting diarrhea or fever  Review of Systems    he she relates pain with deep breath relates some coughing nothing severe no fevers no chest pain Objective:   Physical Exam  Lungs are clear heart regular pulse normal tenderness in the left ribs area. No crackles or rest for distress skin warm dry      Assessment & Plan:  Tramadol as needed for pain cautioned drowsiness X-rays ordered await results. Followup if ongoing troubles

## 2013-03-22 ENCOUNTER — Other Ambulatory Visit: Payer: Self-pay | Admitting: Family Medicine

## 2013-04-06 ENCOUNTER — Other Ambulatory Visit: Payer: Self-pay | Admitting: Family Medicine

## 2013-04-12 ENCOUNTER — Other Ambulatory Visit: Payer: Self-pay | Admitting: Family Medicine

## 2013-05-07 ENCOUNTER — Encounter: Payer: Self-pay | Admitting: Family Medicine

## 2013-05-07 ENCOUNTER — Emergency Department (HOSPITAL_COMMUNITY): Payer: Medicare Other

## 2013-05-07 ENCOUNTER — Encounter (HOSPITAL_COMMUNITY): Payer: Self-pay | Admitting: Emergency Medicine

## 2013-05-07 ENCOUNTER — Telehealth: Payer: Self-pay | Admitting: Family Medicine

## 2013-05-07 ENCOUNTER — Inpatient Hospital Stay (HOSPITAL_COMMUNITY)
Admission: EM | Admit: 2013-05-07 | Discharge: 2013-05-10 | DRG: 291 | Disposition: A | Discharge status: Home Health | Payer: Medicare Other | Attending: Internal Medicine | Admitting: Internal Medicine

## 2013-05-07 ENCOUNTER — Ambulatory Visit (INDEPENDENT_AMBULATORY_CARE_PROVIDER_SITE_OTHER): Payer: Medicare Other | Admitting: Family Medicine

## 2013-05-07 VITALS — BP 110/70 | Temp 97.9°F | Ht 63.0 in | Wt 183.4 lb

## 2013-05-07 DIAGNOSIS — I509 Heart failure, unspecified: Secondary | ICD-10-CM | POA: Diagnosis not present

## 2013-05-07 DIAGNOSIS — J441 Chronic obstructive pulmonary disease with (acute) exacerbation: Secondary | ICD-10-CM

## 2013-05-07 DIAGNOSIS — I2510 Atherosclerotic heart disease of native coronary artery without angina pectoris: Secondary | ICD-10-CM | POA: Diagnosis not present

## 2013-05-07 DIAGNOSIS — F0390 Unspecified dementia without behavioral disturbance: Secondary | ICD-10-CM | POA: Diagnosis present

## 2013-05-07 DIAGNOSIS — J9691 Respiratory failure, unspecified with hypoxia: Secondary | ICD-10-CM

## 2013-05-07 DIAGNOSIS — I700 Atherosclerosis of aorta: Secondary | ICD-10-CM | POA: Diagnosis not present

## 2013-05-07 DIAGNOSIS — Z91199 Patient's noncompliance with other medical treatment and regimen due to unspecified reason: Secondary | ICD-10-CM

## 2013-05-07 DIAGNOSIS — Z7982 Long term (current) use of aspirin: Secondary | ICD-10-CM | POA: Diagnosis not present

## 2013-05-07 DIAGNOSIS — E785 Hyperlipidemia, unspecified: Secondary | ICD-10-CM | POA: Diagnosis present

## 2013-05-07 DIAGNOSIS — I5033 Acute on chronic diastolic (congestive) heart failure: Secondary | ICD-10-CM | POA: Diagnosis not present

## 2013-05-07 DIAGNOSIS — I5031 Acute diastolic (congestive) heart failure: Secondary | ICD-10-CM

## 2013-05-07 DIAGNOSIS — J9600 Acute respiratory failure, unspecified whether with hypoxia or hypercapnia: Secondary | ICD-10-CM | POA: Diagnosis not present

## 2013-05-07 DIAGNOSIS — Z9861 Coronary angioplasty status: Secondary | ICD-10-CM

## 2013-05-07 DIAGNOSIS — J449 Chronic obstructive pulmonary disease, unspecified: Secondary | ICD-10-CM

## 2013-05-07 DIAGNOSIS — R0902 Hypoxemia: Secondary | ICD-10-CM

## 2013-05-07 DIAGNOSIS — I059 Rheumatic mitral valve disease, unspecified: Secondary | ICD-10-CM | POA: Diagnosis not present

## 2013-05-07 DIAGNOSIS — Z79899 Other long term (current) drug therapy: Secondary | ICD-10-CM

## 2013-05-07 DIAGNOSIS — J4489 Other specified chronic obstructive pulmonary disease: Secondary | ICD-10-CM

## 2013-05-07 DIAGNOSIS — M810 Age-related osteoporosis without current pathological fracture: Secondary | ICD-10-CM | POA: Diagnosis present

## 2013-05-07 DIAGNOSIS — J45901 Unspecified asthma with (acute) exacerbation: Secondary | ICD-10-CM | POA: Diagnosis not present

## 2013-05-07 DIAGNOSIS — R0602 Shortness of breath: Secondary | ICD-10-CM | POA: Diagnosis not present

## 2013-05-07 DIAGNOSIS — I6529 Occlusion and stenosis of unspecified carotid artery: Secondary | ICD-10-CM | POA: Diagnosis not present

## 2013-05-07 DIAGNOSIS — J45909 Unspecified asthma, uncomplicated: Secondary | ICD-10-CM

## 2013-05-07 DIAGNOSIS — I252 Old myocardial infarction: Secondary | ICD-10-CM | POA: Diagnosis not present

## 2013-05-07 DIAGNOSIS — Z66 Do not resuscitate: Secondary | ICD-10-CM | POA: Diagnosis present

## 2013-05-07 DIAGNOSIS — Z87891 Personal history of nicotine dependence: Secondary | ICD-10-CM | POA: Diagnosis not present

## 2013-05-07 DIAGNOSIS — I10 Essential (primary) hypertension: Secondary | ICD-10-CM | POA: Diagnosis not present

## 2013-05-07 DIAGNOSIS — Z9119 Patient's noncompliance with other medical treatment and regimen: Secondary | ICD-10-CM

## 2013-05-07 DIAGNOSIS — J90 Pleural effusion, not elsewhere classified: Secondary | ICD-10-CM | POA: Diagnosis not present

## 2013-05-07 LAB — COMPREHENSIVE METABOLIC PANEL
ALT: 10 U/L (ref 0–35)
AST: 16 U/L (ref 0–37)
Albumin: 3.9 g/dL (ref 3.5–5.2)
Alkaline Phosphatase: 72 U/L (ref 39–117)
BUN: 20 mg/dL (ref 6–23)
CALCIUM: 8.8 mg/dL (ref 8.4–10.5)
CO2: 29 meq/L (ref 19–32)
CREATININE: 1.17 mg/dL — AB (ref 0.50–1.10)
Chloride: 101 mEq/L (ref 96–112)
GFR calc non Af Amer: 44 mL/min — ABNORMAL LOW (ref >90)
GFR, EST AFRICAN AMERICAN: 51 mL/min — AB (ref >90)
GLUCOSE: 102 mg/dL — AB (ref 70–99)
Potassium: 4 mEq/L (ref 3.7–5.3)
Sodium: 142 mEq/L (ref 137–147)
Total Bilirubin: 0.4 mg/dL (ref 0.3–1.2)
Total Protein: 7.7 g/dL (ref 6.0–8.3)

## 2013-05-07 LAB — CBC WITH DIFFERENTIAL/PLATELET
BASOS ABS: 0 10*3/uL (ref 0.0–0.1)
Basophils Relative: 1 % (ref 0–1)
Eosinophils Absolute: 0.1 10*3/uL (ref 0.0–0.7)
Eosinophils Relative: 1 % (ref 0–5)
HEMATOCRIT: 36.1 % (ref 36.0–46.0)
Hemoglobin: 11.6 g/dL — ABNORMAL LOW (ref 12.0–15.0)
LYMPHS PCT: 15 % (ref 12–46)
Lymphs Abs: 1 10*3/uL (ref 0.7–4.0)
MCH: 27.5 pg (ref 26.0–34.0)
MCHC: 32.1 g/dL (ref 30.0–36.0)
MCV: 85.5 fL (ref 78.0–100.0)
MONO ABS: 0.4 10*3/uL (ref 0.1–1.0)
Monocytes Relative: 5 % (ref 3–12)
Neutro Abs: 5.2 10*3/uL (ref 1.7–7.7)
Neutrophils Relative %: 78 % — ABNORMAL HIGH (ref 43–77)
Platelets: 209 10*3/uL (ref 150–400)
RBC: 4.22 MIL/uL (ref 3.87–5.11)
RDW: 14.6 % (ref 11.5–15.5)
WBC: 6.6 10*3/uL (ref 4.0–10.5)

## 2013-05-07 LAB — PROTIME-INR
INR: 1.11 (ref 0.00–1.49)
Prothrombin Time: 14.1 seconds (ref 11.6–15.2)

## 2013-05-07 LAB — PRO B NATRIURETIC PEPTIDE: PRO B NATRI PEPTIDE: 3453 pg/mL — AB (ref 0–450)

## 2013-05-07 LAB — D-DIMER, QUANTITATIVE (NOT AT ARMC): D DIMER QUANT: 1.96 ug{FEU}/mL — AB (ref 0.00–0.48)

## 2013-05-07 LAB — TROPONIN I: Troponin I: <0.3 ng/mL (ref <0.30)

## 2013-05-07 MED ORDER — ASPIRIN EC 81 MG PO TBEC
81.0000 mg | DELAYED_RELEASE_TABLET | Freq: Every morning | ORAL | Status: DC
  Administered 2013-05-08 – 2013-05-10 (×3): 81 mg via ORAL
  Filled 2013-05-07 (×4): qty 1

## 2013-05-07 MED ORDER — LORAZEPAM 0.5 MG PO TABS
0.5000 mg | ORAL_TABLET | Freq: Every evening | ORAL | Status: DC | PRN
  Administered 2013-05-08: 1 mg via ORAL
  Filled 2013-05-07: qty 2

## 2013-05-07 MED ORDER — SODIUM CHLORIDE 0.9 % IV SOLN: 250.0000 mL | INTRAVENOUS | Status: DC | PRN

## 2013-05-07 MED ORDER — METHYLPREDNISOLONE SODIUM SUCC 125 MG IJ SOLR
125.0000 mg | Freq: Once | INTRAMUSCULAR | Status: AC
  Administered 2013-05-07: 125 mg via INTRAVENOUS
  Filled 2013-05-07: qty 2

## 2013-05-07 MED ORDER — CITALOPRAM HYDROBROMIDE 20 MG PO TABS
40.0000 mg | ORAL_TABLET | Freq: Every evening | ORAL | Status: DC
  Administered 2013-05-08 – 2013-05-09 (×2): 40 mg via ORAL
  Filled 2013-05-07 (×2): qty 2

## 2013-05-07 MED ORDER — NITROGLYCERIN 0.4 MG SL SUBL: 0.4000 mg | SUBLINGUAL_TABLET | SUBLINGUAL | Status: DC | PRN

## 2013-05-07 MED ORDER — POTASSIUM CHLORIDE CRYS ER 10 MEQ PO TBCR
10.0000 meq | EXTENDED_RELEASE_TABLET | Freq: Two times a day (BID) | ORAL | Status: AC
  Administered 2013-05-08 – 2013-05-09 (×4): 10 meq via ORAL
  Filled 2013-05-07 (×4): qty 1

## 2013-05-07 MED ORDER — ALBUTEROL SULFATE HFA 108 (90 BASE) MCG/ACT IN AERS: 2.0000 | INHALATION_SPRAY | Freq: Four times a day (QID) | RESPIRATORY_TRACT | Status: DC | PRN

## 2013-05-07 MED ORDER — SODIUM CHLORIDE 0.9 % IJ SOLN: 3.0000 mL | INTRAMUSCULAR | Status: DC | PRN

## 2013-05-07 MED ORDER — MAGNESIUM SULFATE 40 MG/ML IJ SOLN
2.0000 g | Freq: Once | INTRAMUSCULAR | Status: AC
  Administered 2013-05-07: 2 g via INTRAVENOUS
  Filled 2013-05-07: qty 50

## 2013-05-07 MED ORDER — IPRATROPIUM-ALBUTEROL 0.5-2.5 (3) MG/3ML IN SOLN
3.0000 mL | Freq: Once | RESPIRATORY_TRACT | Status: AC
  Administered 2013-05-07: 3 mL via RESPIRATORY_TRACT
  Filled 2013-05-07: qty 3

## 2013-05-07 MED ORDER — BUDESONIDE-FORMOTEROL FUMARATE 160-4.5 MCG/ACT IN AERO
2.0000 | INHALATION_SPRAY | Freq: Two times a day (BID) | RESPIRATORY_TRACT | Status: DC
  Administered 2013-05-08 – 2013-05-10 (×5): 2 via RESPIRATORY_TRACT
  Filled 2013-05-07: qty 6

## 2013-05-07 MED ORDER — SIMVASTATIN 20 MG PO TABS
20.0000 mg | ORAL_TABLET | Freq: Every day | ORAL | Status: DC
  Administered 2013-05-08 – 2013-05-09 (×2): 20 mg via ORAL
  Filled 2013-05-07 (×2): qty 1

## 2013-05-07 MED ORDER — AMLODIPINE BESYLATE 5 MG PO TABS
10.0000 mg | ORAL_TABLET | Freq: Every morning | ORAL | Status: DC
  Administered 2013-05-08 – 2013-05-10 (×3): 10 mg via ORAL
  Filled 2013-05-07 (×3): qty 2

## 2013-05-07 MED ORDER — IOHEXOL 350 MG/ML SOLN
100.0000 mL | Freq: Once | INTRAVENOUS | Status: AC | PRN
  Administered 2013-05-07: 80 mL via INTRAVENOUS

## 2013-05-07 MED ORDER — ALBUTEROL SULFATE (2.5 MG/3ML) 0.083% IN NEBU
2.5000 mg | INHALATION_SOLUTION | Freq: Once | RESPIRATORY_TRACT | Status: AC
  Administered 2013-05-07: 2.5 mg via RESPIRATORY_TRACT

## 2013-05-07 MED ORDER — TIOTROPIUM BROMIDE MONOHYDRATE 18 MCG IN CAPS
18.0000 ug | ORAL_CAPSULE | Freq: Every day | RESPIRATORY_TRACT | Status: DC
  Administered 2013-05-08 – 2013-05-10 (×3): 18 ug via RESPIRATORY_TRACT
  Filled 2013-05-07: qty 5

## 2013-05-07 MED ORDER — VITAMIN D 1000 UNITS PO TABS
2000.0000 [IU] | ORAL_TABLET | Freq: Every morning | ORAL | Status: DC
  Administered 2013-05-08 – 2013-05-10 (×3): 2000 [IU] via ORAL
  Filled 2013-05-07 (×3): qty 2

## 2013-05-07 MED ORDER — LOSARTAN POTASSIUM 50 MG PO TABS
100.0000 mg | ORAL_TABLET | Freq: Every morning | ORAL | Status: DC
  Administered 2013-05-08 – 2013-05-10 (×3): 100 mg via ORAL
  Filled 2013-05-07 (×3): qty 2

## 2013-05-07 MED ORDER — SODIUM CHLORIDE 0.9 % IJ SOLN
3.0000 mL | Freq: Two times a day (BID) | INTRAMUSCULAR | Status: DC
  Administered 2013-05-08 – 2013-05-10 (×6): 3 mL via INTRAVENOUS

## 2013-05-07 MED ORDER — ASPIRIN 81 MG PO CHEW
324.0000 mg | CHEWABLE_TABLET | Freq: Once | ORAL | Status: AC
  Administered 2013-05-07: 324 mg via ORAL
  Filled 2013-05-07: qty 4

## 2013-05-07 MED ORDER — ENOXAPARIN SODIUM 30 MG/0.3ML ~~LOC~~ SOLN
30.0000 mg | SUBCUTANEOUS | Status: DC
  Administered 2013-05-08: 30 mg via SUBCUTANEOUS
  Filled 2013-05-07: qty 0.3

## 2013-05-07 MED ORDER — ALBUTEROL SULFATE (2.5 MG/3ML) 0.083% IN NEBU
2.5000 mg | INHALATION_SOLUTION | Freq: Four times a day (QID) | RESPIRATORY_TRACT | Status: DC | PRN
  Administered 2013-05-08: 2.5 mg via RESPIRATORY_TRACT
  Filled 2013-05-07 (×2): qty 3

## 2013-05-07 MED ORDER — DONEPEZIL HCL 5 MG PO TABS
10.0000 mg | ORAL_TABLET | Freq: Every day | ORAL | Status: DC
  Administered 2013-05-07 – 2013-05-09 (×3): 10 mg via ORAL
  Filled 2013-05-07 (×3): qty 2

## 2013-05-07 MED ORDER — FUROSEMIDE 10 MG/ML IJ SOLN
40.0000 mg | Freq: Two times a day (BID) | INTRAMUSCULAR | Status: DC
  Administered 2013-05-08 (×3): 40 mg via INTRAVENOUS
  Filled 2013-05-07 (×4): qty 4

## 2013-05-07 MED ORDER — METOPROLOL TARTRATE 25 MG PO TABS
25.0000 mg | ORAL_TABLET | Freq: Two times a day (BID) | ORAL | Status: DC
  Administered 2013-05-08 – 2013-05-10 (×5): 25 mg via ORAL
  Filled 2013-05-07 (×6): qty 1

## 2013-05-07 MED ORDER — FUROSEMIDE 10 MG/ML IJ SOLN
40.0000 mg | Freq: Once | INTRAMUSCULAR | Status: AC
  Administered 2013-05-07: 40 mg via INTRAVENOUS
  Filled 2013-05-07: qty 4

## 2013-05-07 MED ORDER — ERYTHROMYCIN 5 MG/GM OP OINT: 1.0000 "application " | TOPICAL_OINTMENT | Freq: Every day | OPHTHALMIC | Status: DC | PRN

## 2013-05-07 NOTE — ED Provider Notes (Signed)
CSN: [id number]     Arrival date & time 05/07/13  1753 History  This chart was scribed for Glynn Octave, MD by Bennett Scrape, ED Scribe. This patient was seen in room APA05/APA05 and the patient's care was started at 6:24 PM.   Chief Complaint  Patient presents with  . Shortness of Breath     The history is provided by the patient and a relative. No language interpreter was used.    HPI Comments: [name] is a 78 y.o. female with a h/o COPD and asthma who presents to the Emergency Department complaining of SOB that started last night with associated wheezing and mild NP cough. She states that she used Spirva once, rescue inhaler and Symbicort twice last night with no improvement. She was seen by Dr. Gerda Diss, her PCP, today for the same and was advised to come to the ED for hypoxia. O2 sats were in the 80s in his office and 73% in triage. She was given a nebulizer tx at her PCP's without improvement. She denies receiving steroids today; although, she reports that she was on amoxicillin and prednisone last month for 3 to 4 days for similar symptoms. She denies having any sick contacts at home. She denies wearing O2 at home. She states that she has been eating and drinking normally since the onset and denies any fevers, abdominal pain or CP. She is on lasix at home and denies any missed doses. She has a h/o MI a "few years ago" with cardiac stent placement. Last admission was Winter 2014. Last cardiac catheretization was Fall 2014 and was normal. She has received an influenza and PNA vaccine this year.   Past Medical History  Diagnosis Date  . CAD (coronary artery disease) 08/2009    s/p PCI of the LAD  . Myocardial infarction     Non-st segment elevated  . Hypertension   . Hyperlipidemia   . Bradycardia   . Carotid artery stenosis   . COPD (chronic obstructive pulmonary disease)   . Chronic headache   . Asthma   . Pneumonia   . Ulnar neuropathy   . Seasonal allergies   .  Impaired fasting glucose   . Dementia     patient denies this  . Osteoporosis 2009  nerve damage and shingle lesions to the right eye per pt at bedside  Past Surgical History  Procedure Laterality Date  . Partial hysterectomy  1984  . Radial artery catheter      Bladder  . Bladder surgery  1991  . Cardiac catheterization  11/2010    negative   Family History  Problem Relation Age of Onset  . Addison's disease Mother   . Alcohol abuse Father    History  Substance Use Topics  . Smoking status: Former Smoker -- 1.00 packs/day for 35 years    Quit date: 02/15/2008  . Smokeless tobacco: Never Used  . Alcohol Use: No   No OB history provided.  Review of Systems  A complete 10 system review of systems was obtained and all systems are negative except as noted in the HPI and PMH.   Allergies  Acetaminophen; Codeine; Dilaudid; Erythromycin; and Sulfonamide derivatives  Home Medications   No current outpatient prescriptions on file. Triage Vitals: BP 173/57  Pulse 65  Temp(Src) 98.2 F (36.8 C) (Oral)  Resp 24  Ht 5\' 3"  (1.6 m)  Wt 175 lb (79.379 kg)  BMI 31.01 kg/m2  SpO2 73%  Physical Exam  Nursing note  and vitals reviewed. Constitutional: She is oriented to person, place, and time. She appears well-developed and well-nourished. No distress.  HENT:  Head: Normocephalic and atraumatic.  Eyes: EOM are normal.  Neck: Neck supple. No tracheal deviation present.  Cardiovascular: Normal rate and regular rhythm.   Pulmonary/Chest: Tachypnea noted. No respiratory distress. She has wheezes.  Tachypneic. Hypoxic to 60% on RA. Decreased breath sounds with scattered wheezing   Abdominal: Soft. There is no tenderness.  Musculoskeletal: Normal range of motion.  Intact peripheral pulses, no peripheral edema  Neurological: She is alert and oriented to person, place, and time.  Skin: Skin is warm and dry.  Psychiatric: She has a normal mood and affect. Her behavior is normal.     ED Course  Procedures (including critical care time)  Medications  amLODipine (NORVASC) tablet 10 mg (not administered)  citalopram (CELEXA) tablet 40 mg (not administered)  donepezil (ARICEPT) tablet 10 mg (not administered)  erythromycin ophthalmic ointment 1 application (not administered)  LORazepam (ATIVAN) tablet 0.5-1 mg (1 mg Oral Given 05/08/13 0002)  losartan (COZAAR) tablet 100 mg (not administered)  tiotropium (SPIRIVA) inhalation capsule 18 mcg (not administered)  simvastatin (ZOCOR) tablet 20 mg (not administered)  cholecalciferol (VITAMIN D) tablet 2,000 Units (not administered)  aspirin EC tablet 81 mg (not administered)  metoprolol tartrate (LOPRESSOR) tablet 25 mg (25 mg Oral Given 05/08/13 0003)  budesonide-formoterol (SYMBICORT) 160-4.5 MCG/ACT inhaler 2 puff (not administered)  albuterol (PROVENTIL) (2.5 MG/3ML) 0.083% nebulizer solution 2.5 mg (not administered)  nitroGLYCERIN (NITROSTAT) SL tablet 0.4 mg (not administered)  sodium chloride 0.9 % injection 3 mL (3 mLs Intravenous Given 05/08/13 0004)  sodium chloride 0.9 % injection 3 mL (not administered)  0.9 %  sodium chloride infusion (not administered)  enoxaparin (LOVENOX) injection 30 mg (not administered)  furosemide (LASIX) injection 40 mg (40 mg Intravenous Given 05/08/13 0003)  potassium chloride (K-DUR,KLOR-CON) CR tablet 10 mEq (10 mEq Oral Given 05/08/13 0003)  ipratropium-albuterol (DUONEB) 0.5-2.5 (3) MG/3ML nebulizer solution 3 mL (3 mLs Nebulization Given 05/07/13 1852)  methylPREDNISolone sodium succinate (SOLU-MEDROL) 125 mg/2 mL injection 125 mg (125 mg Intravenous Given 05/07/13 1858)  magnesium sulfate IVPB 2 g 50 mL (0 g Intravenous Stopped 05/07/13 2030)  furosemide (LASIX) injection 40 mg (40 mg Intravenous Given 05/07/13 1940)  aspirin chewable tablet 324 mg (324 mg Oral Given 05/07/13 1939)  iohexol (OMNIPAQUE) 350 MG/ML injection 100 mL (80 mLs Intravenous Contrast Given 05/07/13 2103)     DIAGNOSTIC STUDIES: Oxygen Saturation is 73%% on RA, hypoxic by my interpretation.    COORDINATION OF CARE: 6:32 PM-Pt's O2 sats increased to 94% on 4L O2. Discussed treatment plan which includes meds, CXR, CBC panel, CMP and D-dimer with pt at bedside and pt agreed to plan.   10:24 PM-Consult complete with Dr. Lovell Sheehan, Hospitalist. Patient case explained and discussed. Dr. Lovell Sheehan agrees to admit patient to tele, team 1 for further evaluation and treatment. Consult ended at 10:25 PM.  Labs Review Labs Reviewed  CBC WITH DIFFERENTIAL - Abnormal; Notable for the following:    Hemoglobin 11.6 (*)    Neutrophils Relative % 78 (*)    All other components within normal limits  COMPREHENSIVE METABOLIC PANEL - Abnormal; Notable for the following:    Glucose, Bld 102 (*)    Creatinine, Ser 1.17 (*)    GFR calc non Af Amer 44 (*)    GFR calc Af Amer 51 (*)    All other components within normal limits  PRO B  NATRIURETIC PEPTIDE - Abnormal; Notable for the following:    Pro B Natriuretic peptide (BNP) 3453.0 (*)    All other components within normal limits  D-DIMER, QUANTITATIVE - Abnormal; Notable for the following:    D-Dimer, Quant 1.96 (*)    All other components within normal limits  BLOOD GAS, ARTERIAL - Abnormal; Notable for the following:    Bicarbonate 26.4 (*)    All other components within normal limits  MRSA PCR SCREENING  TROPONIN I  PROTIME-INR  BASIC METABOLIC PANEL  TSH   Imaging Review Ct Angio Chest Pe W/cm &/or Wo Cm  05/07/2013   CLINICAL DATA:  Shortness breath and wheezing.  Nonproductive cough.  EXAM: CT ANGIOGRAPHY CHEST WITH CONTRAST  TECHNIQUE: Multidetector CT imaging of the chest was performed using the standard protocol during bolus administration of intravenous contrast. Multiplanar CT image reconstructions and MIPs were obtained to evaluate the vascular anatomy.  CONTRAST:  80mL OMNIPAQUE IOHEXOL 350 MG/ML SOLN  COMPARISON:  DG CHEST 1V PORT dated  05/07/2013; CT ANGIO CHEST W/CM &/OR WO/CM dated 05/04/2012  FINDINGS: Pulmonary arterial opacification is excellent. No focal filling defect is present to suggest pulmonary embolus.  The heart is enlarged. Coronary artery calcifications are evident. Moderate bilateral pleural effusions are now present. Left greater than right atelectasis is associated. There is some dependent atelectasis in the upper lobes is well. Mild centrilobular emphysematous changes are evident. Additional calcifications are present in the aorta and great vessels. A 50% stenosis is present at the origin of the left subclavian artery. The thoracic inlet is otherwise within normal limits.  Limited imaging of the upper abdomen is unremarkable.  Compression fractures at T6 and T12 or remote. No acute fractures are present. No focal lytic or blastic lesions are evident.  Review of the MIP images confirms the above findings.  IMPRESSION: 1. Moderate bilateral pleural effusions and associated atelectasis. 2. No evidence for pulmonary embolus. 3. Moderate cardiac enlargement. 4. Atherosclerotic disease in the aorta as well as the coronary arteries. There is a 50% stenosis at the origin of the left subclavian artery. 5. Remote compression fractures at T6 and T12.   Electronically Signed   By: Gennette Pachris  Mattern M.D.   On: 05/07/2013 21:47   Dg Chest Portable 1 View  05/07/2013   CLINICAL DATA:  Short of breath for 1 day.  EXAM: PORTABLE CHEST - 1 VIEW  COMPARISON:  DG RIBS UNILATERAL*L* dated 03/19/2013; DG CHEST 2 VIEW dated 03/19/2013  FINDINGS: Pulmonary vascular congestion and bilateral left-greater-than-right pleural effusions are present. Pleural effusions have increased compared to prior. The cardiopericardial silhouette is partially obscured but appears enlarged. Monitoring leads project over the chest. Bilateral basilar opacity likely represents effusion, edema and atelectasis in combination.  IMPRESSION: Findings consistent with mild to moderate  CHF with increasing bilateral left-greater-than-right pleural effusions.   Electronically Signed   By: Andreas NewportGeoffrey  Lamke M.D.   On: 05/07/2013 18:55     EKG Interpretation None      MDM   Final diagnoses:  Respiratory failure with hypoxia  CHF (congestive heart failure)  COPD (chronic obstructive pulmonary disease)   from PCPs office with increased shortness of breath and hypoxia today. History of COPD and CHF. Denies any cough, fever chest pain. Initial O2 sat on evaluation the 60s. Placed on nasal cannula with increased to the mid 90s. She has decreased breath sounds bilaterally with scattered wheezing.  Patient given nebulizers and steroids.  CXR shows edema with effusion. Last echo 9/14  showed normal EF. IV lasix given. No CO2 retention on ABG. O2 saturation mid 90s on 4L.  No increased work of breathing. CT negative for PE. CHF exacerbation likely etiology of dyspnea.  Possible COPD component.    CRITICAL CARE Performed by: Glynn Octave Total critical care time: 30 Critical care time was exclusive of separately billable procedures and treating other patients. Critical care was necessary to treat or prevent imminent or life-threatening deterioration. Critical care was time spent personally by me on the following activities: development of treatment plan with patient and/or surrogate as well as nursing, discussions with consultants, evaluation of patient's response to treatment, examination of patient, obtaining history from patient or surrogate, ordering and performing treatments and interventions, ordering and review of laboratory studies, ordering and review of radiographic studies, pulse oximetry and re-evaluation of patient's condition.       I personally performed the services described in this documentation, which was scribed in my presence. The recorded information has been reviewed and is accurate.      Glynn Octave, MD 05/08/13 [phone]

## 2013-05-07 NOTE — Progress Notes (Signed)
   Subjective:    Patient ID: [name], female    DOB: 08/17/1935, 78 y.o.   MRN: [id number]  Shortness of Breath This is a new problem. The current episode started yesterday. The problem occurs constantly. The problem has been unchanged. Associated symptoms include wheezing. Pertinent negatives include no chest pain, ear pain, fever or rhinorrhea. Nothing aggravates the symptoms. The patient has no known risk factors for DVT/PE. She has tried steroid inhalers for the symptoms. The treatment provided no relief.   Patient states it started over the past couple days she denies any calf pain she denies pleuritic chest pain she just relates increased coughing wheezing shortness of breath increased shortness of breath with activity. Patient has significant COPD history as well as cardiac history.  Review of Systems  Constitutional: Positive for fatigue. Negative for fever and activity change.  HENT: Negative for congestion, ear pain and rhinorrhea.   Eyes: Negative for discharge.  Respiratory: Positive for cough, shortness of breath and wheezing.   Cardiovascular: Negative for chest pain.       Objective:   Physical Exam  Nursing note and vitals reviewed. Constitutional: She appears well-developed.  HENT:  Head: Normocephalic.  Nose: Nose normal.  Mouth/Throat: Oropharynx is clear and moist. No oropharyngeal exudate.  Neck: Neck supple.  Cardiovascular: Normal rate and normal heart sounds.   No murmur heard. Pulmonary/Chest: She has wheezes.  Tachypnea is noted. Worse with minimal activity.  Lymphadenopathy:    She has no cervical adenopathy.  Skin: Skin is warm and dry.    patient was given a nebulizer treatment and was rechecked 25 minutes later and was still tachypneic       Assessment & Plan:  COPD-severe exacerbation. O2 saturation was low 70s when she came to the office. After nebulizer treatment and improved to 81. This patient still has some tachypnea along with  wheezing she needs to go ahead and be seen urgently in the ER probably will need to be admitted. Although I doubt that this is a DVT other etiologies should be entertained as well patient will need x-ray lab work supplemental O2 and probable admission  ER doctor and triaged nurse spoke with. Patient assisted via wheelchair to the car her granddaughter drove her to the ER. Patient stable upon leaving our office.

## 2013-05-07 NOTE — H&P (Signed)
[facility] History and Physical  RAMSI PICKERILL OVF:[id number] DOB: [birth year] DOA: 05/07/2013  Referring physician:  EDP PCP: Lilyan Punt, MD  Specialists:   Chief Complaint: SOB and Wheezing  HPI: [name] is a 78 y.o. female with a history of CAD, HTN, COPD, and Hyperlipidemia who presents to the ED with complaints of worsening SOB since last night.   She was seen by her PCP and found to have hypoxia with O2 sats in the 70's and she was given a nebulizer treatment in the office with only minimal improvement so she was sent to the ED where she was given another neb rx and evalauted further for her sob and hypoxemia and was found to have an elevated D-Dimer so and CTA of the Chest was performed and found to be negative for a PE, but revealed Moderate Bilateral Pleural Effusions Left greater than Right.  She was administered 40 mg of IV lasix and began to improve.  She had a 2D ECHO performed on 089/2014 which revealed LV Diastolic Dysfunction and an EF of 55-60%.   She was referred for medical admission.      Review of Systems:  Constitutional: No Weight Loss, No Weight Gain, Night Sweats, Fevers, Chills, Fatigue, or Generalized Weakness HEENT: No Headaches, Difficulty Swallowing,Tooth/Dental Problems,Sore Throat,  No Sneezing, Rhinitis, Ear Ache, Nasal Congestion, or Post Nasal Drip,  Cardio-vascular:  No Chest pain, +Orthopnea, PND, +Edema in lower extremities, Anasarca, Dizziness, Palpitations  Resp:  +Dyspnea, No DOE, No Productive Cough, No Non-Productive Cough, No Hemoptysis, No Change in Color of Mucus,  +Wheezing.    GI: No Heartburn, Indigestion, Abdominal Pain, Nausea, Vomiting, Diarrhea, Change in Bowel Habits,  Loss of Appetite  GU: No Dysuria, Change in Color of Urine, No Urgency or Frequency.  No flank pain.  Musculoskeletal: No Joint Pain or Swelling.  No Decreased Range of Motion. No Back Pain.  Neurologic: No Syncope, No Seizures, Muscle Weakness, Paresthesia,  Vision Disturbance or Loss, No Diplopia, No Vertigo, No Difficulty Walking,  Skin: No Rash or Lesions. Psych: No Change in Mood or Affect. No Depression or Anxiety. No Memory loss. No Confusion or Hallucinations   Past Medical History  Diagnosis Date  . CAD (coronary artery disease) 08/2009    s/p PCI of the LAD  . Myocardial infarction     Non-st segment elevated  . Hypertension   . Hyperlipidemia   . Bradycardia   . Carotid artery stenosis   . COPD (chronic obstructive pulmonary disease)   . Chronic headache   . Asthma   . Pneumonia   . Ulnar neuropathy   . Seasonal allergies   . Impaired fasting glucose   . Dementia     patient denies this  . Osteoporosis 2009      Past Surgical History  Procedure Laterality Date  . Partial hysterectomy  1984  . Radial artery catheter      Bladder  . Bladder surgery  1991  . Cardiac catheterization  11/2010    negative       Prior to Admission medications   Medication Sig Start Date End Date Taking? Authorizing Provider  albuterol (PROVENTIL HFA;VENTOLIN HFA) 108 (90 BASE) MCG/ACT inhaler Inhale 2 puffs into the lungs every 6 (six) hours as needed for wheezing. 05/07/13  Yes Babs Sciara, MD  alendronate (FOSAMAX) 70 MG tablet Take 70 mg by mouth every 7 (seven) days. Take with a full glass of water on an empty stomach.   Yes Historical  Provider, MD  Alpha-D-Galactosidase [facility]) TABS Take 2 tablets by mouth 3 times/day as needed-between meals & bedtime.    Yes Historical Provider, MD  amLODipine (NORVASC) 10 MG tablet Take 10 mg by mouth every morning.    Yes Historical Provider, MD  aspirin 81 MG EC tablet Take 81 mg by mouth every morning.    Yes Historical Provider, MD  budesonide-formoterol (SYMBICORT) 160-4.5 MCG/ACT inhaler Inhale 2 puffs into the lungs 2 (two) times daily. 02/15/13  Yes Babs Sciara, MD  Cholecalciferol (VITAMIN D3) 2000 UNITS TABS Take 1 tablet by mouth every morning.   Yes Historical Provider, MD   citalopram (CELEXA) 40 MG tablet Take 40 mg by mouth every evening.    Yes Historical Provider, MD  donepezil (ARICEPT) 10 MG tablet Take 10 mg by mouth at bedtime.   Yes Historical Provider, MD  erythromycin ophthalmic ointment Apply 1 application to eye daily as needed.    Yes Historical Provider, MD  furosemide (LASIX) 20 MG tablet Take 20 mg by mouth every morning.    Yes Historical Provider, MD  LORazepam (ATIVAN) 1 MG tablet Take 0.5-1 mg by mouth at bedtime as needed for anxiety.   Yes Historical Provider, MD  losartan (COZAAR) 100 MG tablet Take 100 mg by mouth every morning.    Yes Historical Provider, MD  metoprolol tartrate (LOPRESSOR) 25 MG tablet Take 1 tablet (25 mg total) by mouth 2 (two) times daily. 01/09/13  Yes Tonny Bollman, MD  nitroGLYCERIN (NITROSTAT) 0.4 MG SL tablet Place 0.4 mg under the tongue as needed for chest pain.    Yes Historical Provider, MD  pravastatin (PRAVACHOL) 40 MG tablet Take 40 mg by mouth at bedtime.    Yes Historical Provider, MD  tiotropium (SPIRIVA) 18 MCG inhalation capsule Place 18 mcg into inhaler and inhale daily.   Yes Historical Provider, MD  ibuprofen (ADVIL,MOTRIN) 200 MG tablet Take 400 mg by mouth daily as needed for pain.    Historical Provider, MD      Allergies  Allergen Reactions  . Acetaminophen   . Codeine Nausea And Vomiting  . Dilaudid [Hydromorphone Hcl] Other (See Comments)    Extremely sensitive  . Erythromycin Other (See Comments)    Intense stomach pain  . Sulfonamide Derivatives      Social History:  reports that she quit smoking about 5 years ago. She has never used smokeless tobacco. She reports that she does not drink alcohol or use illicit drugs.     Family History  Problem Relation Age of Onset  . Addison's disease Mother   . Alcohol abuse Father        Physical Exam:  GEN:  Pleasant Elderly 78 y.o. Caucasian female  examined  and in no acute distress; cooperative with exam Filed Vitals:    05/07/13 1902 05/07/13 1939 05/07/13 2000 05/07/13 2100  BP: 151/38 132/41 149/45 124/52  Pulse: 56 57 63 57  Temp:      TempSrc:      Resp: 21 18 23 22   Height:      Weight:      SpO2: 96% 94% 94% 93%   Blood pressure 124/52, pulse 57, temperature 98.2 F (36.8 C), temperature source Oral, resp. rate 22, height 5\' 3"  (1.6 m), weight 79.379 kg (175 lb), SpO2 93.00%. PSYCH: She is alert and oriented x4; does not appear anxious does not appear depressed; affect is normal HEENT: Normocephalic and Atraumatic, Mucous membranes pink; PERRLA; EOM intact; Fundi:  Benign;  No scleral icterus, Nares: Patent, Oropharynx: Clear, Fair Dentition, Neck:  FROM, no cervical lymphadenopathy nor thyromegaly or carotid bruit; no JVD; Breasts:: Not examined CHEST WALL: No tenderness CHEST: Tachypneic,  Decreased Breath Sounds,  Bibasilar Rales,  And occasional Wheezes, No Rhonchi.   HEART: Regular rate and rhythm; no murmurs rubs or gallops BACK: No kyphosis or scoliosis; no CVA tenderness ABDOMEN: Positive Bowel Sounds, soft non-tender; no masses, no organomegaly Rectal Exam: Not done EXTREMITIES: No cyanosis, clubbing or edema; no ulcerations. Genitalia: not examined PULSES: 2+ and symmetric SKIN: Normal hydration no rash or ulceration CNS:  Alert and Oriented x 4, No Focal Deficits.    Vascular: pulses palpable throughout    Labs on Admission:  Basic Metabolic Panel:  Recent Labs Lab 05/07/13 1842  NA 142  K 4.0  CL 101  CO2 29  GLUCOSE 102*  BUN 20  CREATININE 1.17*  CALCIUM 8.8   Liver Function Tests:  Recent Labs Lab 05/07/13 1842  AST 16  ALT 10  ALKPHOS 72  BILITOT 0.4  PROT 7.7  ALBUMIN 3.9   No results found for this basename: LIPASE, AMYLASE,  in the last 168 hours No results found for this basename: AMMONIA,  in the last 168 hours CBC:  Recent Labs Lab 05/07/13 1842  WBC 6.6  NEUTROABS 5.2  HGB 11.6*  HCT 36.1  MCV 85.5  PLT 209   Cardiac  Enzymes:  Recent Labs Lab 05/07/13 1842  TROPONINI <0.30    BNP (last 3 results)  Recent Labs  05/07/13 1842  PROBNP 3453.0*   CBG: No results found for this basename: GLUCAP,  in the last 168 hours  Radiological Exams on Admission: Ct Angio Chest Pe W/cm &/or Wo Cm  05/07/2013   CLINICAL DATA:  Shortness breath and wheezing.  Nonproductive cough.  EXAM: CT ANGIOGRAPHY CHEST WITH CONTRAST  TECHNIQUE: Multidetector CT imaging of the chest was performed using the standard protocol during bolus administration of intravenous contrast. Multiplanar CT image reconstructions and MIPs were obtained to evaluate the vascular anatomy.  CONTRAST:  80mL OMNIPAQUE IOHEXOL 350 MG/ML SOLN  COMPARISON:  DG CHEST 1V PORT dated 05/07/2013; CT ANGIO CHEST W/CM &/OR WO/CM dated 05/04/2012  FINDINGS: Pulmonary arterial opacification is excellent. No focal filling defect is present to suggest pulmonary embolus.  The heart is enlarged. Coronary artery calcifications are evident. Moderate bilateral pleural effusions are now present. Left greater than right atelectasis is associated. There is some dependent atelectasis in the upper lobes is well. Mild centrilobular emphysematous changes are evident. Additional calcifications are present in the aorta and great vessels. A 50% stenosis is present at the origin of the left subclavian artery. The thoracic inlet is otherwise within normal limits.  Limited imaging of the upper abdomen is unremarkable.  Compression fractures at T6 and T12 or remote. No acute fractures are present. No focal lytic or blastic lesions are evident.  Review of the MIP images confirms the above findings.  IMPRESSION: 1. Moderate bilateral pleural effusions and associated atelectasis. 2. No evidence for pulmonary embolus. 3. Moderate cardiac enlargement. 4. Atherosclerotic disease in the aorta as well as the coronary arteries. There is a 50% stenosis at the origin of the left subclavian artery. 5. Remote  compression fractures at T6 and T12.   Electronically Signed   By: Gennette Pachris  Mattern M.D.   On: 05/07/2013 21:47   Dg Chest Portable 1 View  05/07/2013   CLINICAL DATA:  Short of breath for 1 day.  EXAM: PORTABLE CHEST - 1 VIEW  COMPARISON:  DG RIBS UNILATERAL*L* dated 03/19/2013; DG CHEST 2 VIEW dated 03/19/2013  FINDINGS: Pulmonary vascular congestion and bilateral left-greater-than-right pleural effusions are present. Pleural effusions have increased compared to prior. The cardiopericardial silhouette is partially obscured but appears enlarged. Monitoring leads project over the chest. Bilateral basilar opacity likely represents effusion, edema and atelectasis in combination.  IMPRESSION: Findings consistent with mild to moderate CHF with increasing bilateral left-greater-than-right pleural effusions.   Electronically Signed   By: Andreas Newport M.D.   On: 05/07/2013 18:55      EKG: Independently reviewed. Sinus Bradycardia  With PVC,  Rate 54.       Assessment/Plan:   78 y.o. female with  Principal Problem:   Respiratory failure with hypoxia Active Problems:   Acute diastolic CHF (congestive heart failure)   DEMENTIA   HYPERTENSION   CAD, NATIVE VESSEL   ASTHMA   COPD UNSPECIFIED   Hyperlipidemia     1.   Respiratory Failure with Hypoxia-  Due to Acute CHF,  IV Lasix to Diurese per the CHF Protocol and placed on BIPAP, and O2 PRN.    2.    Acute diastolic CHF-  CHF protocol and diurese with IV lasix.  Already on Beta-Blocker, and ARB Rx.    3.    HTN- continue Amlodipine, Metoprolol, and Losartan rx.   Monitor BPs.     4.    CAD- cycle Troponins.    5.    COPD/ Asthma- continue Symbicort, Spiriva, and Ventolin Inhalers.    6.    Hyperlipidemia-   Continue Statin Rx.     7.    DVT prophylaxis with Lovenox.       Code Status:   DO NOT RESUSCITATE Family Communication:    Daughter at bedside Disposition Plan:  Inpatient  Time spent:  60 MInutes  Ron Parker [facility] Pager [phone]  If 7PM-7AM, please contact night-coverage [email or web address] Password [facility] 05/07/2013, 10:52 PM

## 2013-05-07 NOTE — ED Notes (Signed)
Pt states SOB began suddenly around midnight last night. 1+ Edema to lower extremities.

## 2013-05-07 NOTE — Telephone Encounter (Signed)
Pt states last night she got short of breath. No shortness of breath today. Out of inhaler. Wants refill on inhaler. Ventolin sent to pharm. Advised pt if shortness of breath returns she needs to be seen today.

## 2013-05-07 NOTE — Progress Notes (Signed)
Patient placed on bipap 10/5 50% with full face mask, patient tolerating well at this time, will continue to monitor

## 2013-05-07 NOTE — ED Notes (Signed)
Pt up to bedside commode to void. On 4L via [place], pt states dropped to 86%, pt back in bed, taking deep breathes through nose, sat back up to 90% on 4L. Family at the bedside, MD aware.

## 2013-05-07 NOTE — Telephone Encounter (Signed)
Patient says that she ran out of the medication for her ventolin inhaler and needs a refill. She needs this ASAP because she is having a bit of trouble breathing.   [facility]

## 2013-05-07 NOTE — ED Notes (Signed)
Increased sob today, went to PCP, sats were in the 80s, received neb treatment while at PCP with some relief and was sent here.  Denies cough, fever.  Denies pain.

## 2013-05-08 ENCOUNTER — Telehealth: Payer: Self-pay | Admitting: Family Medicine

## 2013-05-08 DIAGNOSIS — I10 Essential (primary) hypertension: Secondary | ICD-10-CM | POA: Diagnosis not present

## 2013-05-08 DIAGNOSIS — J449 Chronic obstructive pulmonary disease, unspecified: Secondary | ICD-10-CM | POA: Diagnosis not present

## 2013-05-08 DIAGNOSIS — I509 Heart failure, unspecified: Secondary | ICD-10-CM | POA: Diagnosis not present

## 2013-05-08 DIAGNOSIS — J441 Chronic obstructive pulmonary disease with (acute) exacerbation: Secondary | ICD-10-CM | POA: Diagnosis not present

## 2013-05-08 DIAGNOSIS — I5031 Acute diastolic (congestive) heart failure: Secondary | ICD-10-CM | POA: Diagnosis not present

## 2013-05-08 DIAGNOSIS — I5033 Acute on chronic diastolic (congestive) heart failure: Secondary | ICD-10-CM | POA: Diagnosis not present

## 2013-05-08 DIAGNOSIS — J9600 Acute respiratory failure, unspecified whether with hypoxia or hypercapnia: Secondary | ICD-10-CM | POA: Diagnosis not present

## 2013-05-08 LAB — BLOOD GAS, ARTERIAL
ACID-BASE EXCESS: 1.9 mmol/L (ref 0.0–2.0)
BICARBONATE: 26.4 meq/L — AB (ref 20.0–24.0)
Delivery systems: POSITIVE
Drawn by: 317771
Expiratory PAP: 5
FIO2: 0.5 %
Inspiratory PAP: 10
O2 Saturation: 97.5 %
PATIENT TEMPERATURE: 37
PH ART: 7.39 (ref 7.350–7.450)
TCO2: 24.1 mmol/L (ref 0–100)
pCO2 arterial: 44.5 mmHg (ref 35.0–45.0)
pO2, Arterial: 99.2 mmHg (ref 80.0–100.0)

## 2013-05-08 LAB — BASIC METABOLIC PANEL
BUN: 20 mg/dL (ref 6–23)
CO2: 30 mEq/L (ref 19–32)
Calcium: 8.7 mg/dL (ref 8.4–10.5)
Chloride: 101 mEq/L (ref 96–112)
Creatinine, Ser: 1.15 mg/dL — ABNORMAL HIGH (ref 0.50–1.10)
GFR, EST AFRICAN AMERICAN: 53 mL/min — AB (ref >90)
GFR, EST NON AFRICAN AMERICAN: 45 mL/min — AB (ref >90)
GLUCOSE: 164 mg/dL — AB (ref 70–99)
POTASSIUM: 4.8 meq/L (ref 3.7–5.3)
SODIUM: 143 meq/L (ref 137–147)

## 2013-05-08 LAB — TSH: TSH: 0.516 u[IU]/mL (ref 0.350–4.500)

## 2013-05-08 LAB — MRSA PCR SCREENING: MRSA by PCR: NEGATIVE

## 2013-05-08 MED ORDER — ALBUTEROL SULFATE (2.5 MG/3ML) 0.083% IN NEBU
2.5000 mg | INHALATION_SOLUTION | RESPIRATORY_TRACT | Status: DC
  Administered 2013-05-08 – 2013-05-09 (×7): 2.5 mg via RESPIRATORY_TRACT
  Filled 2013-05-08 (×7): qty 3

## 2013-05-08 MED ORDER — ENOXAPARIN SODIUM 40 MG/0.4ML ~~LOC~~ SOLN
40.0000 mg | SUBCUTANEOUS | Status: DC
  Administered 2013-05-09 – 2013-05-10 (×2): 40 mg via SUBCUTANEOUS
  Filled 2013-05-08 (×2): qty 0.4

## 2013-05-08 MED ORDER — BIOTENE DRY MOUTH MT LIQD
15.0000 mL | Freq: Two times a day (BID) | OROMUCOSAL | Status: DC
  Administered 2013-05-08: 15 mL via OROMUCOSAL

## 2013-05-08 MED ORDER — CHLORHEXIDINE GLUCONATE 0.12 % MT SOLN
15.0000 mL | Freq: Two times a day (BID) | OROMUCOSAL | Status: DC
  Administered 2013-05-08 (×2): 15 mL via OROMUCOSAL
  Filled 2013-05-08 (×2): qty 15

## 2013-05-08 NOTE — Progress Notes (Signed)
Patient stated she had a headache, pt breathing much better and abg results were normal, pt taken off bipap for a little bit and placed on 2LNC. RT will continue to monitor

## 2013-05-08 NOTE — Progress Notes (Signed)
[facility] PROGRESS NOTE  [name] ZOX:[id number] DOB: [birth year] DOA: 05/07/2013 PCP: Lilyan Punt, MD  Assessment/Plan: 1. Acute respiratory failure due to CHF exacerbation. Patient required BiPAP on admission. She's has been weaned down to nasal cannula. Continue to wean oxygen off as tolerated. Patient is diuresed with IV Lasix. CT angiogram of chest negative for pulmonary embolus. She does have evidence of pleural effusions.  2. Acute on chronic diastolic congestive heart failure. Patient reports compliance with diuretics at home. She does admit to dietary noncompliance. We'll repeat echocardiogram today. Continue IV Lasix. She is on ARB and beta blocker. 3. COPD. No evidence of wheezing at this time. Continue treatments. 4. Hypertension. Stable 5. Dementia. Continue Aricept.  Code Status: Full code Family Communication: Discussed with patient and husband Disposition Plan: Discharge home once improved, transfer to telemetry   Consultants:    Procedures:    Antibiotics:    HPI/Subjective: Feeling better. Shortness of breath has improved. Lower extremity edema also improving.  Objective: Filed Vitals:   05/08/13 1005  BP: 146/74  Pulse: 62  Temp:   Resp:     Intake/Output Summary (Last 24 hours) at 05/08/13 1047 Last data filed at 05/08/13 1000  Gross per 24 hour  Intake    123 ml  Output    450 ml  Net   -327 ml   Filed Weights   05/07/13 1817 05/07/13 2331 05/08/13 0522  Weight: 79.379 kg (175 lb) 83.1 kg (183 lb 3.2 oz) 81.6 kg (179 lb 14.3 oz)    Exam:   General:  No acute distress, lying comfortably in bed  Cardiovascular: S1, S2, regular rate and rhythm  Respiratory: Clear to auscultation bilaterally  Abdomen: Soft, nontender, positive bowel sounds  Musculoskeletal: One plus edema bilaterally lower extremities   Data Reviewed: Basic Metabolic Panel:  Recent Labs Lab 05/07/13 1842 05/08/13 0450  NA 142 143  K 4.0 4.8   CL 101 101  CO2 29 30  GLUCOSE 102* 164*  BUN 20 20  CREATININE 1.17* 1.15*  CALCIUM 8.8 8.7   Liver Function Tests:  Recent Labs Lab 05/07/13 1842  AST 16  ALT 10  ALKPHOS 72  BILITOT 0.4  PROT 7.7  ALBUMIN 3.9   No results found for this basename: LIPASE, AMYLASE,  in the last 168 hours No results found for this basename: AMMONIA,  in the last 168 hours CBC:  Recent Labs Lab 05/07/13 1842  WBC 6.6  NEUTROABS 5.2  HGB 11.6*  HCT 36.1  MCV 85.5  PLT 209   Cardiac Enzymes:  Recent Labs Lab 05/07/13 1842  TROPONINI <0.30   BNP (last 3 results)  Recent Labs  05/07/13 1842  PROBNP 3453.0*   CBG: No results found for this basename: GLUCAP,  in the last 168 hours  Recent Results (from the past 240 hour(s))  MRSA PCR SCREENING     Status: None   Collection Time    05/07/13 11:30 PM      Result Value Ref Range Status   MRSA by PCR NEGATIVE  NEGATIVE Final   Comment:            The GeneXpert MRSA Assay (FDA     approved for NASAL specimens     only), is one component of a     comprehensive MRSA colonization     surveillance program. It is not     intended to diagnose MRSA     infection nor to guide or  monitor treatment for     MRSA infections.     Studies: Ct Angio Chest Pe W/cm &/or Wo Cm  05/07/2013   CLINICAL DATA:  Shortness breath and wheezing.  Nonproductive cough.  EXAM: CT ANGIOGRAPHY CHEST WITH CONTRAST  TECHNIQUE: Multidetector CT imaging of the chest was performed using the standard protocol during bolus administration of intravenous contrast. Multiplanar CT image reconstructions and MIPs were obtained to evaluate the vascular anatomy.  CONTRAST:  48mL OMNIPAQUE IOHEXOL 350 MG/ML SOLN  COMPARISON:  DG CHEST 1V PORT dated 05/07/2013; CT ANGIO CHEST W/CM &/OR WO/CM dated 05/04/2012  FINDINGS: Pulmonary arterial opacification is excellent. No focal filling defect is present to suggest pulmonary embolus.  The heart is enlarged. Coronary artery  calcifications are evident. Moderate bilateral pleural effusions are now present. Left greater than right atelectasis is associated. There is some dependent atelectasis in the upper lobes is well. Mild centrilobular emphysematous changes are evident. Additional calcifications are present in the aorta and great vessels. A 50% stenosis is present at the origin of the left subclavian artery. The thoracic inlet is otherwise within normal limits.  Limited imaging of the upper abdomen is unremarkable.  Compression fractures at T6 and T12 or remote. No acute fractures are present. No focal lytic or blastic lesions are evident.  Review of the MIP images confirms the above findings.  IMPRESSION: 1. Moderate bilateral pleural effusions and associated atelectasis. 2. No evidence for pulmonary embolus. 3. Moderate cardiac enlargement. 4. Atherosclerotic disease in the aorta as well as the coronary arteries. There is a 50% stenosis at the origin of the left subclavian artery. 5. Remote compression fractures at T6 and T12.   Electronically Signed   By: Gennette Pac M.D.   On: 05/07/2013 21:47   Dg Chest Portable 1 View  05/07/2013   CLINICAL DATA:  Short of breath for 1 day.  EXAM: PORTABLE CHEST - 1 VIEW  COMPARISON:  DG RIBS UNILATERAL*L* dated 03/19/2013; DG CHEST 2 VIEW dated 03/19/2013  FINDINGS: Pulmonary vascular congestion and bilateral left-greater-than-right pleural effusions are present. Pleural effusions have increased compared to prior. The cardiopericardial silhouette is partially obscured but appears enlarged. Monitoring leads project over the chest. Bilateral basilar opacity likely represents effusion, edema and atelectasis in combination.  IMPRESSION: Findings consistent with mild to moderate CHF with increasing bilateral left-greater-than-right pleural effusions.   Electronically Signed   By: Andreas Newport M.D.   On: 05/07/2013 18:55    Scheduled Meds: . amLODipine  10 mg Oral q morning - 10a  .  antiseptic oral rinse  15 mL Mouth Rinse q12n4p  . aspirin EC  81 mg Oral q morning - 10a  . budesonide-formoterol  2 puff Inhalation BID  . chlorhexidine  15 mL Mouth/Throat BID  . cholecalciferol  2,000 Units Oral q morning - 10a  . citalopram  40 mg Oral QPM  . donepezil  10 mg Oral QHS  . enoxaparin (LOVENOX) injection  30 mg Subcutaneous Q24H  . furosemide  40 mg Intravenous Q12H  . losartan  100 mg Oral q morning - 10a  . metoprolol tartrate  25 mg Oral BID  . potassium chloride  10 mEq Oral BID  . simvastatin  20 mg Oral q1800  . sodium chloride  3 mL Intravenous Q12H  . tiotropium  18 mcg Inhalation Daily   Continuous Infusions:   Principal Problem:   Respiratory failure with hypoxia Active Problems:   DEMENTIA   HYPERTENSION   CAD, NATIVE VESSEL  ASTHMA   COPD UNSPECIFIED   Hyperlipidemia   Acute diastolic CHF (congestive heart failure)    Time spent: 30mins    Decklan Mau  [facility] Pager [phone]. If 7PM-7AM, please contact night-coverage at [email or web address], password [email or web address] 05/08/2013, 10:47 AM  LOS: 1 day

## 2013-05-08 NOTE — Telephone Encounter (Signed)
Patient was admitted into ICU yesterday and they want to talk to patients granddaughter Misty Stanley about a code status. Misty Stanley would like to discuss this with Dr Lorin Picket before she speaks with them.

## 2013-05-09 DIAGNOSIS — J9600 Acute respiratory failure, unspecified whether with hypoxia or hypercapnia: Secondary | ICD-10-CM | POA: Diagnosis not present

## 2013-05-09 DIAGNOSIS — J441 Chronic obstructive pulmonary disease with (acute) exacerbation: Secondary | ICD-10-CM | POA: Diagnosis not present

## 2013-05-09 DIAGNOSIS — I10 Essential (primary) hypertension: Secondary | ICD-10-CM | POA: Diagnosis not present

## 2013-05-09 DIAGNOSIS — I059 Rheumatic mitral valve disease, unspecified: Secondary | ICD-10-CM | POA: Diagnosis not present

## 2013-05-09 DIAGNOSIS — I5031 Acute diastolic (congestive) heart failure: Secondary | ICD-10-CM | POA: Diagnosis not present

## 2013-05-09 LAB — BASIC METABOLIC PANEL
BUN: 29 mg/dL — ABNORMAL HIGH (ref 6–23)
CHLORIDE: 100 meq/L (ref 96–112)
CO2: 33 mEq/L — ABNORMAL HIGH (ref 19–32)
Calcium: 8.6 mg/dL (ref 8.4–10.5)
Creatinine, Ser: 1.3 mg/dL — ABNORMAL HIGH (ref 0.50–1.10)
GFR calc Af Amer: 45 mL/min — ABNORMAL LOW (ref >90)
GFR calc non Af Amer: 39 mL/min — ABNORMAL LOW (ref >90)
GLUCOSE: 112 mg/dL — AB (ref 70–99)
Potassium: 3.8 mEq/L (ref 3.7–5.3)
Sodium: 143 mEq/L (ref 137–147)

## 2013-05-09 LAB — CBC
HEMATOCRIT: 34.4 % — AB (ref 36.0–46.0)
Hemoglobin: 10.9 g/dL — ABNORMAL LOW (ref 12.0–15.0)
MCH: 27.3 pg (ref 26.0–34.0)
MCHC: 31.7 g/dL (ref 30.0–36.0)
MCV: 86.2 fL (ref 78.0–100.0)
Platelets: 204 10*3/uL (ref 150–400)
RBC: 3.99 MIL/uL (ref 3.87–5.11)
RDW: 14.9 % (ref 11.5–15.5)
WBC: 7.5 10*3/uL (ref 4.0–10.5)

## 2013-05-09 MED ORDER — PREDNISONE 20 MG PO TABS
40.0000 mg | ORAL_TABLET | Freq: Every day | ORAL | Status: DC
  Administered 2013-05-09 – 2013-05-10 (×2): 40 mg via ORAL
  Filled 2013-05-09 (×2): qty 2

## 2013-05-09 MED ORDER — FUROSEMIDE 40 MG PO TABS
40.0000 mg | ORAL_TABLET | Freq: Every day | ORAL | Status: DC
  Administered 2013-05-09 – 2013-05-10 (×2): 40 mg via ORAL
  Filled 2013-05-09 (×2): qty 1

## 2013-05-09 MED ORDER — ALBUTEROL SULFATE (2.5 MG/3ML) 0.083% IN NEBU
2.5000 mg | INHALATION_SOLUTION | RESPIRATORY_TRACT | Status: DC
  Administered 2013-05-09 – 2013-05-10 (×4): 2.5 mg via RESPIRATORY_TRACT
  Filled 2013-05-09 (×4): qty 3

## 2013-05-09 MED ORDER — BIOTENE DRY MOUTH MT LIQD
15.0000 mL | Freq: Two times a day (BID) | OROMUCOSAL | Status: DC
  Administered 2013-05-09 (×2): 15 mL via OROMUCOSAL

## 2013-05-09 NOTE — Progress Notes (Signed)
Pt 02 sat after ambulating in hall.

## 2013-05-09 NOTE — Progress Notes (Signed)
Report called to S.Heath,RN. Patient transferred in wheelchair in stable condition. Family notified.

## 2013-05-09 NOTE — Progress Notes (Signed)
[facility] PROGRESS NOTE  [name] ZOX:[id number] DOB: May 29, 1935 DOA: 05/07/2013 PCP: Lilyan PuntLUKING,SCOTT, MD  Assessment/Plan: 1. Acute respiratory failure due to CHF exacerbation/COPD. Patient required BiPAP on admission. She's has been weaned down to nasal cannula. Continue to wean oxygen off as tolerated. Patient is diuresed with IV Lasix. CT angiogram of chest negative for pulmonary embolus. She does have evidence of pleural effusions.  2. Acute on chronic diastolic congestive heart failure. Patient reports compliance with diuretics at home. She does admit to dietary noncompliance. Echocardiogram is pending.  She appears to be approaching euvolemia.  Will change lasix to po. 3. COPD exacerbation. Patient has diminished breath sounds bilaterally and mild wheezes despite receiving nebulizer treatments. Will start short course of prednisone. 4. Hypertension. Stable 5. Dementia. Continue Aricept.  Code Status: Full code Family Communication: Discussed with patient and husband Disposition Plan: Discharge home once improved   Consultants:    Procedures:    Antibiotics:    HPI/Subjective: Feeling better. Shortness of breath has improved. Lower extremity edema also improving.  Objective: Filed Vitals:   05/09/13 1012  BP:   Pulse: 69  Temp:   Resp: 20    Intake/Output Summary (Last 24 hours) at 05/09/13 1107 Last data filed at 05/09/13 1000  Gross per 24 hour  Intake      0 ml  Output   2650 ml  Net  -2650 ml   Filed Weights   05/07/13 2331 05/08/13 0522 05/09/13 0500  Weight: 83.1 kg (183 lb 3.2 oz) 81.6 kg (179 lb 14.3 oz) 80.7 kg (177 lb 14.6 oz)    Exam:   General:  No acute distress, lying comfortably in bed  Cardiovascular: S1, S2, regular rate and rhythm  Respiratory: diminished breath sounds bilaterally with mild expiratory wheezes  Abdomen: Soft, nontender, positive bowel sounds  Musculoskeletal: no pedal edema bilaterally lower  extremities   Data Reviewed: Basic Metabolic Panel:  Recent Labs Lab 05/07/13 1842 05/08/13 0450 05/09/13 0456  NA 142 143 143  K 4.0 4.8 3.8  CL 101 101 100  CO2 29 30 33*  GLUCOSE 102* 164* 112*  BUN 20 20 29*  CREATININE 1.17* 1.15* 1.30*  CALCIUM 8.8 8.7 8.6   Liver Function Tests:  Recent Labs Lab 05/07/13 1842  AST 16  ALT 10  ALKPHOS 72  BILITOT 0.4  PROT 7.7  ALBUMIN 3.9   No results found for this basename: LIPASE, AMYLASE,  in the last 168 hours No results found for this basename: AMMONIA,  in the last 168 hours CBC:  Recent Labs Lab 05/07/13 1842 05/09/13 0456  WBC 6.6 7.5  NEUTROABS 5.2  --   HGB 11.6* 10.9*  HCT 36.1 34.4*  MCV 85.5 86.2  PLT 209 204   Cardiac Enzymes:  Recent Labs Lab 05/07/13 1842  TROPONINI <0.30   BNP (last 3 results)  Recent Labs  05/07/13 1842  PROBNP 3453.0*   CBG: No results found for this basename: GLUCAP,  in the last 168 hours  Recent Results (from the past 240 hour(s))  MRSA PCR SCREENING     Status: None   Collection Time    05/07/13 11:30 PM      Result Value Ref Range Status   MRSA by PCR NEGATIVE  NEGATIVE Final   Comment:            The GeneXpert MRSA Assay (FDA     approved for NASAL specimens     only), is one component of a  comprehensive MRSA colonization     surveillance program. It is not     intended to diagnose MRSA     infection nor to guide or     monitor treatment for     MRSA infections.     Studies: Ct Angio Chest Pe W/cm &/or Wo Cm  05/07/2013   CLINICAL DATA:  Shortness breath and wheezing.  Nonproductive cough.  EXAM: CT ANGIOGRAPHY CHEST WITH CONTRAST  TECHNIQUE: Multidetector CT imaging of the chest was performed using the standard protocol during bolus administration of intravenous contrast. Multiplanar CT image reconstructions and MIPs were obtained to evaluate the vascular anatomy.  CONTRAST:  80mL OMNIPAQUE IOHEXOL 350 MG/ML SOLN  COMPARISON:  DG CHEST 1V PORT  dated 05/07/2013; CT ANGIO CHEST W/CM &/OR WO/CM dated 05/04/2012  FINDINGS: Pulmonary arterial opacification is excellent. No focal filling defect is present to suggest pulmonary embolus.  The heart is enlarged. Coronary artery calcifications are evident. Moderate bilateral pleural effusions are now present. Left greater than right atelectasis is associated. There is some dependent atelectasis in the upper lobes is well. Mild centrilobular emphysematous changes are evident. Additional calcifications are present in the aorta and great vessels. A 50% stenosis is present at the origin of the left subclavian artery. The thoracic inlet is otherwise within normal limits.  Limited imaging of the upper abdomen is unremarkable.  Compression fractures at T6 and T12 or remote. No acute fractures are present. No focal lytic or blastic lesions are evident.  Review of the MIP images confirms the above findings.  IMPRESSION: 1. Moderate bilateral pleural effusions and associated atelectasis. 2. No evidence for pulmonary embolus. 3. Moderate cardiac enlargement. 4. Atherosclerotic disease in the aorta as well as the coronary arteries. There is a 50% stenosis at the origin of the left subclavian artery. 5. Remote compression fractures at T6 and T12.   Electronically Signed   By: Gennette Pac M.D.   On: 05/07/2013 21:47   Dg Chest Portable 1 View  05/07/2013   CLINICAL DATA:  Short of breath for 1 day.  EXAM: PORTABLE CHEST - 1 VIEW  COMPARISON:  DG RIBS UNILATERAL*L* dated 03/19/2013; DG CHEST 2 VIEW dated 03/19/2013  FINDINGS: Pulmonary vascular congestion and bilateral left-greater-than-right pleural effusions are present. Pleural effusions have increased compared to prior. The cardiopericardial silhouette is partially obscured but appears enlarged. Monitoring leads project over the chest. Bilateral basilar opacity likely represents effusion, edema and atelectasis in combination.  IMPRESSION: Findings consistent with mild to  moderate CHF with increasing bilateral left-greater-than-right pleural effusions.   Electronically Signed   By: Andreas Newport M.D.   On: 05/07/2013 18:55    Scheduled Meds: . albuterol  2.5 mg Nebulization Q4H  . amLODipine  10 mg Oral q morning - 10a  . antiseptic oral rinse  15 mL Mouth Rinse BID  . aspirin EC  81 mg Oral q morning - 10a  . budesonide-formoterol  2 puff Inhalation BID  . cholecalciferol  2,000 Units Oral q morning - 10a  . citalopram  40 mg Oral QPM  . donepezil  10 mg Oral QHS  . enoxaparin (LOVENOX) injection  40 mg Subcutaneous Q24H  . furosemide  40 mg Oral Daily  . losartan  100 mg Oral q morning - 10a  . metoprolol tartrate  25 mg Oral BID  . predniSONE  40 mg Oral Q breakfast  . simvastatin  20 mg Oral q1800  . sodium chloride  3 mL Intravenous Q12H  .  tiotropium  18 mcg Inhalation Daily   Continuous Infusions:   Principal Problem:   Respiratory failure with hypoxia Active Problems:   DEMENTIA   HYPERTENSION   CAD, NATIVE VESSEL   ASTHMA   COPD UNSPECIFIED   Hyperlipidemia   Acute diastolic CHF (congestive heart failure)    Time spent:    [name],[name]  [facility] Pager [phone]. If 7PM-7AM, please contact night-coverage at [email or web address], password [facility] 05/09/2013, 11:07 AM  LOS: 2 days

## 2013-05-09 NOTE — Care Management Note (Signed)
UR completed 

## 2013-05-09 NOTE — Telephone Encounter (Signed)
Long discussion held with the patient as well as in the ICU with the patient and family. She has decided to remain full code but she also states that if her situation was hopeless or helpless she would not want any measures taken to sustain living. She also stated that she would not want to be on a ventilator long term.

## 2013-05-09 NOTE — Progress Notes (Signed)
*  PRELIMINARY RESULTS* Echocardiogram 2D Echocardiogram has been performed.  [name]  05/09/2013, 2:45 PM

## 2013-05-10 DIAGNOSIS — I5031 Acute diastolic (congestive) heart failure: Secondary | ICD-10-CM | POA: Diagnosis not present

## 2013-05-10 DIAGNOSIS — J441 Chronic obstructive pulmonary disease with (acute) exacerbation: Secondary | ICD-10-CM | POA: Diagnosis not present

## 2013-05-10 DIAGNOSIS — I10 Essential (primary) hypertension: Secondary | ICD-10-CM | POA: Diagnosis not present

## 2013-05-10 DIAGNOSIS — J9600 Acute respiratory failure, unspecified whether with hypoxia or hypercapnia: Secondary | ICD-10-CM | POA: Diagnosis not present

## 2013-05-10 LAB — CBC
HEMATOCRIT: 34.8 % — AB (ref 36.0–46.0)
Hemoglobin: 11.2 g/dL — ABNORMAL LOW (ref 12.0–15.0)
MCH: 27.7 pg (ref 26.0–34.0)
MCHC: 32.2 g/dL (ref 30.0–36.0)
MCV: 86.1 fL (ref 78.0–100.0)
Platelets: 198 10*3/uL (ref 150–400)
RBC: 4.04 MIL/uL (ref 3.87–5.11)
RDW: 14.6 % (ref 11.5–15.5)
WBC: 7.3 10*3/uL (ref 4.0–10.5)

## 2013-05-10 LAB — BASIC METABOLIC PANEL
BUN: 30 mg/dL — ABNORMAL HIGH (ref 6–23)
CHLORIDE: 99 meq/L (ref 96–112)
CO2: 33 mEq/L — ABNORMAL HIGH (ref 19–32)
Calcium: 8.8 mg/dL (ref 8.4–10.5)
Creatinine, Ser: 1.15 mg/dL — ABNORMAL HIGH (ref 0.50–1.10)
GFR calc Af Amer: 53 mL/min — ABNORMAL LOW (ref >90)
GFR calc non Af Amer: 45 mL/min — ABNORMAL LOW (ref >90)
Glucose, Bld: 110 mg/dL — ABNORMAL HIGH (ref 70–99)
Potassium: 4.3 mEq/L (ref 3.7–5.3)
Sodium: 141 mEq/L (ref 137–147)

## 2013-05-10 MED ORDER — PREDNISONE 10 MG PO TABS: ORAL_TABLET | ORAL | Status: DC

## 2013-05-10 MED ORDER — FUROSEMIDE 40 MG PO TABS: 40.0000 mg | ORAL_TABLET | Freq: Every morning | ORAL | Status: DC

## 2013-05-10 MED ORDER — CITALOPRAM HYDROBROMIDE 40 MG PO TABS: 20.0000 mg | ORAL_TABLET | Freq: Every evening | ORAL | Status: DC

## 2013-05-10 NOTE — Care Management Note (Signed)
    Page 1 of 2   05/10/2013     3:35:53 PM   CARE MANAGEMENT NOTE 05/10/2013  Patient:  [name], [name]   Account Number:  [id number]  Date Initiated:  05/10/2013  Documentation initiated by:  Anibal Henderson  Subjective/Objective Assessment:   Pt admitted with COPD and CHF, with hypoxia. She is from home with spouse and will return home. She does not use O2 at home, but may need O2 for home use this episode.     Action/Plan:   Pt to D/C home with [facility] and  new O2   Anticipated DC Date:  05/10/2013   Anticipated DC Plan:  HOME W HOME HEALTH SERVICES      DC Planning Services  CM consult      PAC Choice  DURABLE MEDICAL EQUIPMENT  HOME HEALTH   Choice offered to / List presented to:  C-1 Patient   DME arranged  [facility]      DME agency  [facility]     [facility] arranged  HH-1 RN  HH-10 DISEASE MANAGEMENT      HH agency  [facility]   Status of service:  Completed, signed off Medicare Important Message given?  YES (If response is "NO", the following Medicare IM given date fields will be blank) Date Medicare IM given:  05/10/2013 Date Additional Medicare IM given:    Discharge Disposition:  HOME W HOME HEALTH SERVICES  Per UR Regulation:  Reviewed for med. necessity/level of care/duration of stay  If discussed at Long Length of Stay Meetings, dates discussed:    Comments:  05/10/13 1530 Anibal Henderson RN/CM

## 2013-05-10 NOTE — Progress Notes (Addendum)
Completed walking sats with pt this morning.   At rest on 2L O2 pt sats at 96%  At rest on RA pt sats at 94%  Standing on RA pt sats at 91%  On ambulation pt sats at 86% room air  On 2L O2 pt rebounds to 94%.  Will continue to monitor.

## 2013-05-10 NOTE — Discharge Instructions (Signed)
Heart Failure °Heart failure is a condition in which the heart has trouble pumping blood. This means your heart does not pump blood efficiently for your body to work well. In some cases of heart failure, fluid may back up into your lungs or you may have swelling (edema) in your lower legs. Heart failure is usually a long-term (chronic) condition. It is important for you to take good care of yourself and follow your caregiver's treatment plan. °CAUSES  °Some health conditions can cause heart failure. Those health conditions include: °· High blood pressure (hypertension) causes the heart muscle to work harder than normal. When pressure in the blood vessels is high, the heart needs to pump (contract) with more force in order to circulate blood throughout the body. High blood pressure eventually causes the heart to become stiff and weak. °· Coronary artery disease (CAD) is the buildup of cholesterol and fat (plaque) in the arteries of the heart. The blockage in the arteries deprives the heart muscle of oxygen and blood. This can cause chest pain and may lead to a heart attack. High blood pressure can also contribute to CAD. °· Heart attack (myocardial infarction) occurs when 1 or more arteries in the heart become blocked. The loss of oxygen damages the muscle tissue of the heart. When this happens, part of the heart muscle dies. The injured tissue does not contract as well and weakens the heart's ability to pump blood. °· Abnormal heart valves can cause heart failure when the heart valves do not open and close properly. This makes the heart muscle pump harder to keep the blood flowing. °· Heart muscle disease (cardiomyopathy or myocarditis) is damage to the heart muscle from a variety of causes. These can include drug or alcohol abuse, infections, or unknown reasons. These can increase the risk of heart failure. °· Lung disease makes the heart work harder because the lungs do not work properly. This can cause a strain  on the heart, leading it to fail. °· Diabetes increases the risk of heart failure. High blood sugar contributes to high fat (lipid) levels in the blood. Diabetes can also cause slow damage to tiny blood vessels that carry important nutrients to the heart muscle. When the heart does not get enough oxygen and food, it can cause the heart to become weak and stiff. This leads to a heart that does not contract efficiently. °· Other conditions can contribute to heart failure. These include abnormal heart rhythms, thyroid problems, and low blood counts (anemia). °Certain unhealthy behaviors can increase the risk of heart failure. Those unhealthy behaviors include: °· Being overweight. °· Smoking or chewing tobacco. °· Eating foods high in fat and cholesterol. °· Abusing illicit drugs or alcohol. °· Lacking physical activity. °SYMPTOMS  °Heart failure symptoms may vary and can be hard to detect. Symptoms may include: °· Shortness of breath with activity, such as climbing stairs. °· Persistent cough. °· Swelling of the feet, ankles, legs, or abdomen. °· Unexplained weight gain. °· Difficulty breathing when lying flat (orthopnea). °· Waking from sleep because of the need to sit up and get more air. °· Rapid heartbeat. °· Fatigue and loss of energy. °· Feeling lightheaded, dizzy, or close to fainting. °· Loss of appetite. °· Nausea. °· Increased urination during the night (nocturia). °DIAGNOSIS  °A diagnosis of heart failure is based on your history, symptoms, physical examination, and diagnostic tests. °Diagnostic tests for heart failure may include: °· Echocardiography. °· Electrocardiography. °· Chest X-ray. °· Blood tests. °· Exercise   stress test. °· Cardiac angiography. °· Radionuclide scans. °TREATMENT  °Treatment is aimed at managing the symptoms of heart failure. Medicines, behavioral changes, or surgical intervention may be necessary to treat heart failure. °· Medicines to help treat heart failure may  include: °· Angiotensin-converting enzyme (ACE) inhibitors. This type of medicine blocks the effects of a blood protein called angiotensin-converting enzyme. ACE inhibitors relax (dilate) the blood vessels and help lower blood pressure. °· Angiotensin receptor blockers. This type of medicine blocks the actions of a blood protein called angiotensin. Angiotensin receptor blockers dilate the blood vessels and help lower blood pressure. °· Water pills (diuretics). Diuretics cause the kidneys to remove salt and water from the blood. The extra fluid is removed through urination. This loss of extra fluid lowers the volume of blood the heart pumps. °· Beta blockers. These prevent the heart from beating too fast and improve heart muscle strength. °· Digitalis. This increases the force of the heartbeat. °· Healthy behavior changes include: °· Obtaining and maintaining a healthy weight. °· Stopping smoking or chewing tobacco. °· Eating heart healthy foods. °· Limiting or avoiding alcohol. °· Stopping illicit drug use. °· Physical activity as directed by your caregiver. °· Surgical treatment for heart failure may include: °· A procedure to open blocked arteries, repair damaged heart valves, or remove damaged heart muscle tissue. °· A pacemaker to improve heart muscle function and control certain abnormal heart rhythms. °· An internal cardioverter defibrillator to treat certain serious abnormal heart rhythms. °· A left ventricular assist device to assist the pumping ability of the heart. °HOME CARE INSTRUCTIONS  °· Take your medicine as directed by your caregiver. Medicines are important in reducing the workload of your heart, slowing the progression of heart failure, and improving your symptoms. °· Do not stop taking your medicine unless directed by your caregiver. °· Do not skip any dose of medicine. °· Refill your prescriptions before you run out of medicine. Your medicines are needed every day. °· Take over-the-counter  medicine only as directed by your caregiver or pharmacist. °· Engage in moderate physical activity if directed by your caregiver. Moderate physical activity can benefit some people. The elderly and people with severe heart failure should consult with a caregiver for physical activity recommendations. °· Eat heart healthy foods. Food choices should be free of trans fat and low in saturated fat, cholesterol, and salt (sodium). Healthy choices include fresh or frozen fruits and vegetables, fish, lean meats, legumes, fat-free or low-fat dairy products, and whole grain or high fiber foods. Talk to a dietitian to learn more about heart healthy foods. °· Limit sodium if directed by your caregiver. Sodium restriction may reduce symptoms of heart failure in some people. Talk to a dietitian to learn more about heart healthy seasonings. °· Use healthy cooking methods. Healthy cooking methods include roasting, grilling, broiling, baking, poaching, steaming, or stir-frying. Talk to a dietitian to learn more about healthy cooking methods. °· Limit fluids if directed by your caregiver. Fluid restriction may reduce symptoms of heart failure in some people. °· Weigh yourself every day. Daily weights are important in the early recognition of excess fluid. You should weigh yourself every morning after you urinate and before you eat breakfast. Wear the same amount of clothing each time you weigh yourself. Record your daily weight. Provide your caregiver with your weight record. °· Monitor and record your blood pressure if directed by your caregiver. °· Check your pulse if directed by your caregiver. °· Lose weight if directed   by your caregiver. Weight loss may reduce symptoms of heart failure in some people. °· Stop smoking or chewing tobacco. Nicotine makes your heart work harder by causing your blood vessels to constrict. Do not use nicotine gum or patches before talking to your caregiver. °· Schedule and attend follow-up visits as  directed by your caregiver. It is important to keep all your appointments. °· Limit alcohol intake to no more than 1 drink per day for nonpregnant women and 2 drinks per day for men. Drinking more than that is harmful to your heart. Tell your caregiver if you drink alcohol several times a week. Talk with your caregiver about whether alcohol is safe for you. If your heart has already been damaged by alcohol or you have severe heart failure, drinking alcohol should be stopped completely. °· Stop illicit drug use. °· Stay up-to-date with immunizations. It is especially important to prevent respiratory infections through current pneumococcal and influenza immunizations. °· Manage other health conditions such as hypertension, diabetes, thyroid disease, or abnormal heart rhythms as directed by your caregiver. °· Learn to manage stress. °· Plan rest periods when fatigued. °· Learn strategies to manage high temperatures. If the weather is extremely hot: °· Avoid vigorous physical activity. °· Use air conditioning or fans or seek a cooler location. °· Avoid caffeine and alcohol. °· Wear loose-fitting, lightweight, and light-colored clothing. °· Learn strategies to manage cold temperatures. If the weather is extremely cold: °· Avoid vigorous physical activity. °· Layer clothes. °· Wear mittens or gloves, a hat, and a scarf when going outside. °· Avoid alcohol. °· Obtain ongoing education and support as needed. °· Participate or seek rehabilitation as needed to maintain or improve independence and quality of life. °SEEK MEDICAL CARE IF:  °· Your weight increases by 03 lb/1.4 kg in 1 day or 05 lb/2.3 kg in a week. °· You have increasing shortness of breath that is unusual for you. °· You are unable to participate in your usual physical activities. °· You tire easily. °· You cough more than normal, especially with physical activity. °· You have any or more swelling in areas such as your hands, feet, ankles, or abdomen. °· You  are unable to sleep because it is hard to breathe. °· You feel like your heart is beating fast (palpitations). °· You become dizzy or lightheaded upon standing up. °SEEK IMMEDIATE MEDICAL CARE IF:  °· You have difficulty breathing. °· There is a change in mental status such as decreased alertness or difficulty with concentration. °· You have a pain or discomfort in your chest. °· You have an episode of fainting (syncope). °MAKE SURE YOU:  °· Understand these instructions. °· Will watch your condition. °· Will get help right away if you are not doing well or get worse. °Document Released: 01/31/2005 Document Revised: 05/28/2012 Document Reviewed: 02/23/2012 °[employer]® Patient Information ©2014 [employer]. ° °

## 2013-05-10 NOTE — Discharge Summary (Signed)
Physician Discharge Summary  ANUM PALECEK ZOX:[id number] DOB: [birth year] DOA: 05/07/2013  PCP: Lilyan Punt, MD  Admit date: 05/07/2013 Discharge date: 05/10/2013  Time spent: 45 minutes  Recommendations for Outpatient Follow-up:  1. Patient is set up with home health RN. She'll be discharged home on 2 L of oxygen. 2. Followup with primary care physician in one to 2 weeks  Discharge Diagnoses:  Principal Problem:   Respiratory failure with hypoxia Active Problems:   DEMENTIA   HYPERTENSION   CAD, NATIVE VESSEL   ASTHMA   COPD UNSPECIFIED   Hyperlipidemia   Acute diastolic CHF (congestive heart failure)   Discharge Condition: Improved  Diet recommendation: Low salt  Filed Weights   05/08/13 0522 05/09/13 0500 05/10/13 0500  Weight: 81.6 kg (179 lb 14.3 oz) 80.7 kg (177 lb 14.6 oz) 78.518 kg (173 lb 1.6 oz)    History of present illness:  [name] is a 78 y.o. female with a history of CAD, HTN, COPD, and Hyperlipidemia who presents to the ED with complaints of worsening SOB since last night. She was seen by her PCP and found to have hypoxia with O2 sats in the 70's and she was given a nebulizer treatment in the office with only minimal improvement so she was sent to the ED where she was given another neb rx and evalauted further for her sob and hypoxemia and was found to have an elevated D-Dimer so and CTA of the Chest was performed and found to be negative for a PE, but revealed Moderate Bilateral Pleural Effusions Left greater than Right. She was administered 40 mg of IV lasix and began to improve. She had a 2D ECHO performed on 089/2014 which revealed LV Diastolic Dysfunction and an EF of 55-60%. She was referred for medical admission.    Hospital Course:  This patient was admitted to the hospital shortness of breath. She was found to have acute on chronic diastolic congestive heart failure. She did admit to noncompliance with her diet and fluid intake. She was  diuresed with IV Lasix with good effect. She has now achieved euvolemia and has been transitioned back to oral Lasix. We have increased her maintenance dose to 20 mg to 40 mg once a day. Echocardiogram was done with results as below. Despite diuresis, patient continued to be short of breath and wheezing. She was continued on bronchodilators and received a short course of steroids. The patient felt significantly improved. She is able to ambulate without any significant shortness of breath. She did desaturate on ambulation to 86%. She'll be discharged home with oxygen which can be reassessed on her primary care followup. I suspect that she'll need oxygen for a short amount of time. The patient was set up with home health services to help educate regarding CHF management. She is ready for discharge home  Procedures: Echocardiogram- Procedure narrative: Transthoracic echocardiography. Image quality was suboptimal. - Left ventricle: The cavity size was normal. Systolic function was vigorous. The estimated ejection fraction was in the range of 65% to 70%. Mild posterior wall and severe asymmetric basal septal hypertrophy (1.84 cm). Features are consistent with a pseudonormal left ventricular filling pattern, with concomitant abnormal relaxation and increased filling pressure (grade 2 diastolic dysfunction). Doppler parameters are consistent with high ventricular filling pressure. - Aortic valve: Uncertain number of leaflets. Poorly visualized. Mildly calcified annulus. Mildly thickened leaflets. No transvalvular velocities were obtained. - Mitral valve: Mildly thickened leaflets . Mild mitral annular calcification. Mild regurgitation. - Left atrium: The  atrium was moderately dilated. - Systemic veins: IVC dilated with normal respiratory variation. Estimated CVP 8 mmHg.    Consultations:    Discharge Exam: Filed Vitals:   05/10/13 0834  BP:   Pulse: 62  Temp:   Resp:     General: No  acute distress Cardiovascular:  S1, S2, regular rate and rhythm Respiratory: Diminished breath sounds bilaterally but otherwise clear  Discharge Instructions  Discharge Orders   Future Orders Complete By Expires   (HEART FAILURE PATIENTS) Call MD:  Anytime you have any of the following symptoms: 1) 3 pound weight gain in 24 hours or 5 pounds in 1 week 2) shortness of breath, with or without a dry hacking cough 3) swelling in the hands, feet or stomach 4) if you have to sleep on extra pillows at night in order to breathe.  As directed    Diet - low sodium heart healthy  As directed    Face-to-face encounter (required for Medicare/Medicaid patients)  As directed    Comments:     I Makilah Dowda certify that this patient is under my care and that I, or a nurse practitioner or physician's assistant working with me, had a face-to-face encounter that meets the physician face-to-face encounter requirements with this patient on 05/10/2013. The encounter with the patient was in whole, or in part for the following medical condition(s) which is the primary reason for home health care (List medical condition): admitted with chf and copd exacerbations.  Needs home health RN   Questions:     The encounter with the patient was in whole, or in part, for the following medical condition, which is the primary reason for home health care:  chf exacerbation   I certify that, based on my findings, the following services are medically necessary home health services:  Nursing   My clinical findings support the need for the above services:  Shortness of breath with activity   Further, I certify that my clinical findings support that this patient is homebound due to:  Shortness of Breath with activity   Reason for Medically Necessary Home Health Services:  Skilled Nursing- Teaching of Disease Process/Symptom Management   Home Health  As directed    Questions:     To provide the following care/treatments:  RN   Increase  activity slowly  As directed        Medication List    STOP taking these medications       ibuprofen 200 MG tablet  Commonly known as:  ADVIL,MOTRIN      TAKE these medications       albuterol 108 (90 BASE) MCG/ACT inhaler  Commonly known as:  PROVENTIL HFA;VENTOLIN HFA  Inhale 2 puffs into the lungs every 6 (six) hours as needed for wheezing.     alendronate 70 MG tablet  Commonly known as:  FOSAMAX  Take 70 mg by mouth every 7 (seven) days. Take with a full glass of water on an empty stomach.     amLODipine 10 MG tablet  Commonly known as:  NORVASC  Take 10 mg by mouth every morning.     aspirin 81 MG EC tablet  Take 81 mg by mouth every morning.     BEANO Tabs  Take 2 tablets by mouth 3 times/day as needed-between meals & bedtime.     budesonide-formoterol 160-4.5 MCG/ACT inhaler  Commonly known as:  SYMBICORT  Inhale 2 puffs into the lungs 2 (two) times daily.     citalopram  40 MG tablet  Commonly known as:  CELEXA  Take 0.5 tablets (20 mg total) by mouth every evening.     donepezil 10 MG tablet  Commonly known as:  ARICEPT  Take 10 mg by mouth at bedtime.     erythromycin ophthalmic ointment  Apply 1 application to eye daily as needed.     furosemide 40 MG tablet  Commonly known as:  LASIX  Take 1 tablet (40 mg total) by mouth every morning.     LORazepam 1 MG tablet  Commonly known as:  ATIVAN  Take 0.5-1 mg by mouth at bedtime as needed for anxiety.     losartan 100 MG tablet  Commonly known as:  COZAAR  Take 100 mg by mouth every morning.     metoprolol tartrate 25 MG tablet  Commonly known as:  LOPRESSOR  Take 1 tablet (25 mg total) by mouth 2 (two) times daily.     nitroGLYCERIN 0.4 MG SL tablet  Commonly known as:  NITROSTAT  Place 0.4 mg under the tongue as needed for chest pain.     pravastatin 40 MG tablet  Commonly known as:  PRAVACHOL  Take 40 mg by mouth at bedtime.     predniSONE 10 MG tablet  Commonly known as:  DELTASONE   Take 40mg  po daily for 2 days then 30mg  po daily for 2 days then 20mg  po daily for 2 days then 10mg  po daily for 2 days then stop     tiotropium 18 MCG inhalation capsule  Commonly known as:  SPIRIVA  Place 18 mcg into inhaler and inhale daily.     Vitamin D3 2000 UNITS Tabs  Take 1 tablet by mouth every morning.       Allergies  Allergen Reactions  . Acetaminophen   . Codeine Nausea And Vomiting  . Dilaudid [Hydromorphone Hcl] Other (See Comments)    Extremely sensitive  . Erythromycin Other (See Comments)    Intense stomach pain  . Sulfonamide Derivatives        Follow-up Information   Follow up with LUKING,SCOTT, MD. Schedule an appointment as soon as possible for a visit in 1 week.   Specialty:  Family Medicine   Contact information:   [address] [place] [address] [phone]       Follow up with [facility]. (they will call you)    Contact information:   [address] [place] [address] [phone]        The results of significant diagnostics from this hospitalization (including imaging, microbiology, ancillary and laboratory) are listed below for reference.    Significant Diagnostic Studies: Ct Angio Chest Pe W/cm &/or Wo Cm  05/07/2013   CLINICAL DATA:  Shortness breath and wheezing.  Nonproductive cough.  EXAM: CT ANGIOGRAPHY CHEST WITH CONTRAST  TECHNIQUE: Multidetector CT imaging of the chest was performed using the standard protocol during bolus administration of intravenous contrast. Multiplanar CT image reconstructions and MIPs were obtained to evaluate the vascular anatomy.  CONTRAST:  80mL OMNIPAQUE IOHEXOL 350 MG/ML SOLN  COMPARISON:  DG CHEST 1V PORT dated 05/07/2013; CT ANGIO CHEST W/CM &/OR WO/CM dated 05/04/2012  FINDINGS: Pulmonary arterial opacification is excellent. No focal filling defect is present to suggest pulmonary embolus.  The heart is enlarged. Coronary artery calcifications are evident.  Moderate bilateral pleural effusions are now present. Left greater than right atelectasis is associated. There is some dependent atelectasis in the upper lobes is well. Mild  centrilobular emphysematous changes are evident. Additional calcifications are present in the aorta and great vessels. A 50% stenosis is present at the origin of the left subclavian artery. The thoracic inlet is otherwise within normal limits.  Limited imaging of the upper abdomen is unremarkable.  Compression fractures at T6 and T12 or remote. No acute fractures are present. No focal lytic or blastic lesions are evident.  Review of the MIP images confirms the above findings.  IMPRESSION: 1. Moderate bilateral pleural effusions and associated atelectasis. 2. No evidence for pulmonary embolus. 3. Moderate cardiac enlargement. 4. Atherosclerotic disease in the aorta as well as the coronary arteries. There is a 50% stenosis at the origin of the left subclavian artery. 5. Remote compression fractures at T6 and T12.   Electronically Signed   By: Gennette Pac M.D.   On: 05/07/2013 21:47   Dg Chest Portable 1 View  05/07/2013   CLINICAL DATA:  Short of breath for 1 day.  EXAM: PORTABLE CHEST - 1 VIEW  COMPARISON:  DG RIBS UNILATERAL*L* dated 03/19/2013; DG CHEST 2 VIEW dated 03/19/2013  FINDINGS: Pulmonary vascular congestion and bilateral left-greater-than-right pleural effusions are present. Pleural effusions have increased compared to prior. The cardiopericardial silhouette is partially obscured but appears enlarged. Monitoring leads project over the chest. Bilateral basilar opacity likely represents effusion, edema and atelectasis in combination.  IMPRESSION: Findings consistent with mild to moderate CHF with increasing bilateral left-greater-than-right pleural effusions.   Electronically Signed   By: Andreas Newport M.D.   On: 05/07/2013 18:55    Microbiology: Recent Results (from the past 240 hour(s))  MRSA PCR SCREENING     Status: None    Collection Time    05/07/13 11:30 PM      Result Value Ref Range Status   MRSA by PCR NEGATIVE  NEGATIVE Final   Comment:            The GeneXpert MRSA Assay (FDA     approved for NASAL specimens     only), is one component of a     comprehensive MRSA colonization     surveillance program. It is not     intended to diagnose MRSA     infection nor to guide or     monitor treatment for     MRSA infections.     Labs: Basic Metabolic Panel:  Recent Labs Lab 05/07/13 1842 05/08/13 0450 05/09/13 0456 05/10/13 0600  NA 142 143 143 141  K 4.0 4.8 3.8 4.3  CL 101 101 100 99  CO2 29 30 33* 33*  GLUCOSE 102* 164* 112* 110*  BUN 20 20 29* 30*  CREATININE 1.17* 1.15* 1.30* 1.15*  CALCIUM 8.8 8.7 8.6 8.8   Liver Function Tests:  Recent Labs Lab 05/07/13 1842  AST 16  ALT 10  ALKPHOS 72  BILITOT 0.4  PROT 7.7  ALBUMIN 3.9   No results found for this basename: LIPASE, AMYLASE,  in the last 168 hours No results found for this basename: AMMONIA,  in the last 168 hours CBC:  Recent Labs Lab 05/07/13 1842 05/09/13 0456 05/10/13 0600  WBC 6.6 7.5 7.3  NEUTROABS 5.2  --   --   HGB 11.6* 10.9* 11.2*  HCT 36.1 34.4* 34.8*  MCV 85.5 86.2 86.1  PLT 209 204 198   Cardiac Enzymes:  Recent Labs Lab 05/07/13 1842  TROPONINI <0.30   BNP: BNP (last 3 results)  Recent Labs  05/07/13 1842  PROBNP 3453.0*   CBG:  No results found for this basename: GLUCAP,  in the last 168 hours     Signed:  Myka Hitz  [facility] 05/10/2013, 7:08 PM

## 2013-05-10 NOTE — Progress Notes (Signed)
Per MD order patient was discharged. Reviewed discharge instructions with patient and husband. Removed IV at this time. Patient received symbicort and spiriva inhaler. Patient and husband had no questions at this time. Cathy, NT will transport patient to car at this time.

## 2013-05-11 DIAGNOSIS — J441 Chronic obstructive pulmonary disease with (acute) exacerbation: Secondary | ICD-10-CM | POA: Diagnosis not present

## 2013-05-11 DIAGNOSIS — I2510 Atherosclerotic heart disease of native coronary artery without angina pectoris: Secondary | ICD-10-CM | POA: Diagnosis not present

## 2013-05-11 DIAGNOSIS — I10 Essential (primary) hypertension: Secondary | ICD-10-CM | POA: Diagnosis not present

## 2013-05-11 DIAGNOSIS — F0390 Unspecified dementia without behavioral disturbance: Secondary | ICD-10-CM | POA: Diagnosis not present

## 2013-05-11 DIAGNOSIS — J45909 Unspecified asthma, uncomplicated: Secondary | ICD-10-CM | POA: Diagnosis not present

## 2013-05-11 DIAGNOSIS — I5031 Acute diastolic (congestive) heart failure: Secondary | ICD-10-CM | POA: Diagnosis not present

## 2013-05-11 DIAGNOSIS — I509 Heart failure, unspecified: Secondary | ICD-10-CM | POA: Diagnosis not present

## 2013-05-14 ENCOUNTER — Ambulatory Visit (INDEPENDENT_AMBULATORY_CARE_PROVIDER_SITE_OTHER): Payer: Medicare Other | Admitting: Family Medicine

## 2013-05-14 ENCOUNTER — Encounter: Payer: Self-pay | Admitting: Family Medicine

## 2013-05-14 ENCOUNTER — Telehealth: Payer: Self-pay | Admitting: Cardiovascular Disease

## 2013-05-14 VITALS — BP 118/64 | HR 52 | Ht 63.0 in | Wt 173.0 lb

## 2013-05-14 DIAGNOSIS — I498 Other specified cardiac arrhythmias: Secondary | ICD-10-CM

## 2013-05-14 DIAGNOSIS — J45909 Unspecified asthma, uncomplicated: Secondary | ICD-10-CM

## 2013-05-14 DIAGNOSIS — I2510 Atherosclerotic heart disease of native coronary artery without angina pectoris: Secondary | ICD-10-CM

## 2013-05-14 DIAGNOSIS — I5031 Acute diastolic (congestive) heart failure: Secondary | ICD-10-CM | POA: Diagnosis not present

## 2013-05-14 DIAGNOSIS — F0390 Unspecified dementia without behavioral disturbance: Secondary | ICD-10-CM | POA: Diagnosis not present

## 2013-05-14 DIAGNOSIS — I509 Heart failure, unspecified: Secondary | ICD-10-CM | POA: Diagnosis not present

## 2013-05-14 DIAGNOSIS — I10 Essential (primary) hypertension: Secondary | ICD-10-CM | POA: Diagnosis not present

## 2013-05-14 DIAGNOSIS — J441 Chronic obstructive pulmonary disease with (acute) exacerbation: Secondary | ICD-10-CM | POA: Diagnosis not present

## 2013-05-14 NOTE — Telephone Encounter (Signed)
Called Sabra Heck with [facility] and left message for her to call me back regarding patient

## 2013-05-14 NOTE — Telephone Encounter (Signed)
New message          Pt was seen in the hospital for heart failure. Pt came home on Friday. Pt is feeling ok but heart rate is running 38-40. Nurse would like for [facility] to give her a call back.

## 2013-05-14 NOTE — Progress Notes (Signed)
   Subjective:    Patient ID: [name], female    DOB: 1935/03/24, 78 y.o.   MRN: [id number]  HPIFollow up from hospital stay for respiratory failure. Patient claims she did not take an overdose.   Concerns about using the oxygen. Hospital told pt to use all the time. Home health nurse told pt to use only as needed.   Home health nurse checked pulse today  and it was 38. Taking metoprolol 25mg  BID.   Patient states overall breathing has improved.  Review of Systems No chest pain no headache no back pain no abdominal pain no change in bowel habits    Objective:   Physical Exam  Alert no apparent distress pulse 50s. Lungs diminished breath sounds no wheezes no crackles heart rhythm control ankles trace edema      Assessment & Plan:  Impression 1 status post CHF admission. #2 dyspnea improved. #3 hypoxia discussed maintain oxygen for now. #4 bradycardia plan metoprolol dose adjusted. Salt intake discussed. Followup as scheduled. WSL

## 2013-05-15 ENCOUNTER — Telehealth: Payer: Self-pay | Admitting: *Deleted

## 2013-05-15 ENCOUNTER — Other Ambulatory Visit: Payer: Self-pay | Admitting: *Deleted

## 2013-05-15 NOTE — Telephone Encounter (Signed)
ok 

## 2013-05-15 NOTE — Telephone Encounter (Signed)
OK, [facility] said they faxed over the order for a hospital bed for you to sign. I do not see the fax on the machine, nor up front. I did not know if it is in your pile on your desk? Otherwise, I will keep an eye out for incoming faxes.

## 2013-05-15 NOTE — Telephone Encounter (Signed)
I spoke with [facility] [phone] and they were calling to see if they could get an order for a hospital bed. They said Sunday has a skilled home health nurse temporarily that visits every 3 days. They are currently scheduled to come until 07/08/13. If all goes well with the plan of care, then [name] will be coming back to our office. We are still her Primary Care Provider since this care is temporary. She also has occupational therapy that starts on 06/03/13 and ends 07/01/13 for a total of 4 visits.

## 2013-05-15 NOTE — Telephone Encounter (Signed)
Ok oorder hosp bed, just saw yest

## 2013-05-15 NOTE — Telephone Encounter (Signed)
[name], you like to use this thing too much

## 2013-05-15 NOTE — Telephone Encounter (Signed)
Patient was evaluated by PCP yesterday

## 2013-05-15 NOTE — Telephone Encounter (Signed)
Never mind, grabbed it from your office. Order and chart are in your pile now at the [employer].

## 2013-05-17 DIAGNOSIS — I509 Heart failure, unspecified: Secondary | ICD-10-CM | POA: Diagnosis not present

## 2013-05-17 DIAGNOSIS — F0390 Unspecified dementia without behavioral disturbance: Secondary | ICD-10-CM | POA: Diagnosis not present

## 2013-05-17 DIAGNOSIS — I2510 Atherosclerotic heart disease of native coronary artery without angina pectoris: Secondary | ICD-10-CM | POA: Diagnosis not present

## 2013-05-17 DIAGNOSIS — J45909 Unspecified asthma, uncomplicated: Secondary | ICD-10-CM | POA: Diagnosis not present

## 2013-05-17 DIAGNOSIS — J441 Chronic obstructive pulmonary disease with (acute) exacerbation: Secondary | ICD-10-CM | POA: Diagnosis not present

## 2013-05-17 DIAGNOSIS — I5031 Acute diastolic (congestive) heart failure: Secondary | ICD-10-CM | POA: Diagnosis not present

## 2013-05-21 DIAGNOSIS — I509 Heart failure, unspecified: Secondary | ICD-10-CM | POA: Diagnosis not present

## 2013-05-21 DIAGNOSIS — I5031 Acute diastolic (congestive) heart failure: Secondary | ICD-10-CM | POA: Diagnosis not present

## 2013-05-21 DIAGNOSIS — F0390 Unspecified dementia without behavioral disturbance: Secondary | ICD-10-CM | POA: Diagnosis not present

## 2013-05-21 DIAGNOSIS — I2510 Atherosclerotic heart disease of native coronary artery without angina pectoris: Secondary | ICD-10-CM | POA: Diagnosis not present

## 2013-05-21 DIAGNOSIS — J441 Chronic obstructive pulmonary disease with (acute) exacerbation: Secondary | ICD-10-CM | POA: Diagnosis not present

## 2013-05-21 DIAGNOSIS — J45909 Unspecified asthma, uncomplicated: Secondary | ICD-10-CM | POA: Diagnosis not present

## 2013-05-22 ENCOUNTER — Ambulatory Visit
Admission: RE | Admit: 2013-05-22 | Discharge: 2013-05-22 | Disposition: A | Discharge status: Home / Self Care | Payer: Medicare Other | Source: Ambulatory Visit | Attending: Nurse Practitioner | Admitting: Nurse Practitioner

## 2013-05-22 ENCOUNTER — Ambulatory Visit (INDEPENDENT_AMBULATORY_CARE_PROVIDER_SITE_OTHER): Payer: Medicare Other | Admitting: Nurse Practitioner

## 2013-05-22 ENCOUNTER — Telehealth: Payer: Self-pay | Admitting: Family Medicine

## 2013-05-22 ENCOUNTER — Encounter: Payer: Self-pay | Admitting: Nurse Practitioner

## 2013-05-22 VITALS — BP 120/70 | HR 42 | Ht 63.0 in | Wt 175.8 lb

## 2013-05-22 DIAGNOSIS — R0989 Other specified symptoms and signs involving the circulatory and respiratory systems: Secondary | ICD-10-CM | POA: Diagnosis not present

## 2013-05-22 DIAGNOSIS — I5032 Chronic diastolic (congestive) heart failure: Secondary | ICD-10-CM | POA: Diagnosis not present

## 2013-05-22 DIAGNOSIS — J90 Pleural effusion, not elsewhere classified: Secondary | ICD-10-CM

## 2013-05-22 DIAGNOSIS — I509 Heart failure, unspecified: Secondary | ICD-10-CM | POA: Diagnosis not present

## 2013-05-22 DIAGNOSIS — I5031 Acute diastolic (congestive) heart failure: Secondary | ICD-10-CM | POA: Diagnosis not present

## 2013-05-22 DIAGNOSIS — J441 Chronic obstructive pulmonary disease with (acute) exacerbation: Secondary | ICD-10-CM | POA: Diagnosis not present

## 2013-05-22 DIAGNOSIS — R0600 Dyspnea, unspecified: Secondary | ICD-10-CM

## 2013-05-22 DIAGNOSIS — R0609 Other forms of dyspnea: Secondary | ICD-10-CM

## 2013-05-22 DIAGNOSIS — I2510 Atherosclerotic heart disease of native coronary artery without angina pectoris: Secondary | ICD-10-CM

## 2013-05-22 DIAGNOSIS — F0390 Unspecified dementia without behavioral disturbance: Secondary | ICD-10-CM | POA: Diagnosis not present

## 2013-05-22 DIAGNOSIS — J45909 Unspecified asthma, uncomplicated: Secondary | ICD-10-CM | POA: Diagnosis not present

## 2013-05-22 LAB — CBC
HCT: 34.3 % — ABNORMAL LOW (ref 36.0–46.0)
Hemoglobin: 11.3 g/dL — ABNORMAL LOW (ref 12.0–15.0)
MCHC: 33 g/dL (ref 30.0–36.0)
MCV: 83.9 fl (ref 78.0–100.0)
Platelets: 172 10*3/uL (ref 150.0–400.0)
RBC: 4.09 Mil/uL (ref 3.87–5.11)
RDW: 15.7 % — ABNORMAL HIGH (ref 11.5–14.6)
WBC: 7.4 10*3/uL (ref 4.5–10.5)

## 2013-05-22 LAB — BASIC METABOLIC PANEL
BUN: 22 mg/dL (ref 6–23)
CO2: 31 mEq/L (ref 19–32)
Calcium: 8.3 mg/dL — ABNORMAL LOW (ref 8.4–10.5)
Chloride: 96 mEq/L (ref 96–112)
Creatinine, Ser: 1.7 mg/dL — ABNORMAL HIGH (ref 0.4–1.2)
GFR: 31.1 mL/min — ABNORMAL LOW (ref >60.00)
Glucose, Bld: 85 mg/dL (ref 70–99)
Potassium: 4 mEq/L (ref 3.5–5.1)
Sodium: 136 mEq/L (ref 135–145)

## 2013-05-22 LAB — BRAIN NATRIURETIC PEPTIDE: Pro B Natriuretic peptide (BNP): 761 pg/mL — ABNORMAL HIGH (ref 0.0–100.0)

## 2013-05-22 MED ORDER — METOPROLOL TARTRATE 25 MG PO TABS: 12.5000 mg | ORAL_TABLET | Freq: Two times a day (BID) | ORAL | Status: DC

## 2013-05-22 NOTE — Telephone Encounter (Signed)
Script faxed to [facility]. Misty Stanley was notified.

## 2013-05-22 NOTE — Patient Instructions (Signed)
We need to check lab today  Please go to [facility] to [facility] on the first floor for a chest Xray - you may walk in.   Stay on your current medicines but we need to cut the Metoprolol back to just a half a pill two times a day  See me & Dr. Excell Seltzer in next 10 to 14 days  Continue using your oxygen  Call the [facility] office at [phone] if you have any questions, problems or concerns.

## 2013-05-22 NOTE — Telephone Encounter (Signed)
pts family went to know if we can write a script for Oxy Ears for her oxygen tubes, she has a  Spot on her right side from the tube rubbing her scalp.   [place]

## 2013-05-22 NOTE — Telephone Encounter (Signed)
Please give script 

## 2013-05-22 NOTE — Progress Notes (Signed)
[name] Date of Birth: [birth year] Medical Record #[id number]  History of Present Illness: Ms. Hetz is seen back today for a post hospital visit. Seen for Dr. Excell Seltzer. She is a 78 year old female with CAD with prior stenting of the LAD in 2011 - recent cath has shown patency but with vigorous LV function that was concerning for apical hypertrophic CM - sent for cardiac MRI which did show a narrow LV outflow tract, systolic anterior motion of the mitral valve and subepicardial enhancement on delayed imaging consistent with a variant of hypertrophic CM - on beta blocker therapy. Her other issues include dementia, HTN, COPD, HLD and diastolic HF. She has been noted to be quite sedentary.   Last seen by Dr. Excell Seltzer in November. Beta blocker started due to the findings on the cardiac MRI.   Most recently presented to the ER with worsening dyspnea - found to be hypoxic with sats down in the 70's. Had been noncompliant with diet and fluids. D dimer elevated but CTA chest negative for PE. Did have moderate pleural effusion - left greater than right - was diuresed but not tapped. EF normal but with diastolic dysfunction. Does not look like she had thoracentesis. Was sent home on oxygen therapy.   Comes in today. Here with her grand daughter [name]. [name] has told me that sometimes Ms. Cobos does not really tell you how she is feeling. Ms. Aleo says she is "just fine". She denies being short of breath. No chest pain. Not dizzy or lightheaded. [facility] has come out and noted HR down in the 30's. No falls. She wants off her oxygen. Came in without it here today and was 85%.   Current Outpatient Prescriptions  Medication Sig Dispense Refill  . albuterol (PROVENTIL HFA;VENTOLIN HFA) 108 (90 BASE) MCG/ACT inhaler Inhale 2 puffs into the lungs every 6 (six) hours as needed for wheezing.  1 Inhaler  5  . alendronate (FOSAMAX) 70 MG tablet Take 70 mg by mouth every 7 (seven) days. Take with a full  glass of water on an empty stomach.      . Alpha-D-Galactosidase (BEANO) TABS Take 2 tablets by mouth 3 times/day as needed-between meals & bedtime.       Marland Kitchen amLODipine (NORVASC) 10 MG tablet Take 10 mg by mouth every morning.       Marland Kitchen aspirin 81 MG EC tablet Take 81 mg by mouth every morning.       . budesonide-formoterol (SYMBICORT) 160-4.5 MCG/ACT inhaler Inhale 2 puffs into the lungs 2 (two) times daily.  1 Inhaler  12  . Cholecalciferol (VITAMIN D3) 2000 UNITS TABS Take 1 tablet by mouth every morning.      . citalopram (CELEXA) 40 MG tablet Take 0.5 tablets (20 mg total) by mouth every evening.      . donepezil (ARICEPT) 10 MG tablet Take 10 mg by mouth at bedtime.      Marland Kitchen erythromycin ophthalmic ointment Apply 1 application to eye daily as needed.       . furosemide (LASIX) 40 MG tablet Take 1 tablet (40 mg total) by mouth every morning.  30 tablet  1  . LORazepam (ATIVAN) 1 MG tablet Take 0.5-1 mg by mouth at bedtime as needed for anxiety.      Marland Kitchen losartan (COZAAR) 100 MG tablet Take 100 mg by mouth every morning.       . metoprolol tartrate (LOPRESSOR) 25 MG tablet Take 1 tablet (25 mg total) by  mouth 2 (two) times daily.  180 tablet  3  . nitroGLYCERIN (NITROSTAT) 0.4 MG SL tablet Place 0.4 mg under the tongue as needed for chest pain.       . pravastatin (PRAVACHOL) 40 MG tablet Take 40 mg by mouth at bedtime.       Marland Kitchen. tiotropium (SPIRIVA) 18 MCG inhalation capsule Place 18 mcg into inhaler and inhale daily.       No current facility-administered medications for this visit.    Allergies  Allergen Reactions  . Acetaminophen   . Codeine Nausea And Vomiting  . Dilaudid [Hydromorphone Hcl] Other (See Comments)    Extremely sensitive  . Erythromycin Other (See Comments)    Intense stomach pain  . Sulfonamide Derivatives     Past Medical History  Diagnosis Date  . CAD (coronary artery disease) 08/2009    s/p PCI of the LAD  . Myocardial infarction     Non-st segment elevated  .  Hypertension   . Hyperlipidemia   . Bradycardia   . Carotid artery stenosis   . COPD (chronic obstructive pulmonary disease)   . Chronic headache   . Asthma   . Pneumonia   . Ulnar neuropathy   . Seasonal allergies   . Impaired fasting glucose   . Dementia     patient denies this  . Osteoporosis 2009    Past Surgical History  Procedure Laterality Date  . Partial hysterectomy  1984  . Radial artery catheter      Bladder  . Bladder surgery  1991  . Cardiac catheterization  11/2010    negative    History  Smoking status  . Former Smoker -- 1.00 packs/day for 35 years  . Quit date: 02/15/2008  Smokeless tobacco  . Never Used    History  Alcohol Use No    Family History  Problem Relation Age of Onset  . Addison's disease Mother   . Alcohol abuse Father     Review of Systems: The review of systems is per the HPI.  All other systems were reviewed and are negative.  Physical Exam: BP 120/70  Pulse 42  Ht 5\' 3"  (1.6 m)  Wt 175 lb 12.8 oz (79.742 kg)  BMI 31.15 kg/m2  SpO2 86% Patient is pleasant and in no acute distress. Skin is warm and dry. Color is normal.  HEENT is unremarkable but with deformity of the right eye from prior shingles. Normocephalic/atraumatic. PERRL. Sclera are nonicteric. Neck is supple. No masses. No JVD. Lungs are clear with decreased breath sounds but seems to be moving air ok. Oxygen sat slowly increased to mid 90s. Cardiac exam shows a regular rate and rhythm. Rate is quite slow.  Heart tones distant. Abdomen is soft. Extremities are without edema. Gait and ROM are intact. No gross neurologic deficits noted.  Wt Readings from Last 3 Encounters:  05/22/13 175 lb 12.8 oz (79.742 kg)  05/14/13 173 lb (78.472 kg)  05/10/13 173 lb 1.6 oz (78.518 kg)     LABORATORY DATA: EKG shows sinus brady - rate of 46. Diffuse anterior lateral T wave changes.   Lab Results  Component Value Date   WBC 7.3 05/10/2013   HGB 11.2* 05/10/2013   HCT 34.8*  05/10/2013   PLT 198 05/10/2013   GLUCOSE 110* 05/10/2013   CHOL 136 05/03/2012   TRIG 85 05/03/2012   HDL 43 05/03/2012   LDLCALC 76 05/03/2012   ALT 10 05/07/2013   AST 16 05/07/2013  NA 141 05/10/2013   K 4.3 05/10/2013   CL 99 05/10/2013   CREATININE 1.15* 05/10/2013   BUN 30* 05/10/2013   CO2 33* 05/10/2013   TSH 0.516 05/08/2013   INR 1.11 05/07/2013   HGBA1C 6.4* 05/03/2012   CTA CHEST IMPRESSION: 1. Moderate bilateral pleural effusions and associated atelectasis. 2. No evidence for pulmonary embolus. 3. Moderate cardiac enlargement. 4. Atherosclerotic disease in the aorta as well as the coronary arteries. There is a 50% stenosis at the origin of the left subclavian artery. 5. Remote compression fractures at T6 and T12.   Electronically Signed By: Gennette Pac M.D. On: 05/07/2013 21:47  Echo Study Conclusions from March 2015  - Procedure narrative: Transthoracic echocardiography. Image quality was suboptimal. - Left ventricle: The cavity size was normal. Systolic function was vigorous. The estimated ejection fraction was in the range of 65% to 70%. Mild posterior wall and severe asymmetric basal septal hypertrophy (1.84 cm). Features are consistent with a pseudonormal left ventricular filling pattern, with concomitant abnormal relaxation and increased filling pressure (grade 2 diastolic dysfunction). Doppler parameters are consistent with high ventricular filling pressure. - Aortic valve: Uncertain number of leaflets. Poorly visualized. Mildly calcified annulus. Mildly thickened leaflets. No transvalvular velocities were obtained. - Mitral valve: Mildly thickened leaflets . Mild mitral annular calcification. Mild regurgitation. - Left atrium: The atrium was moderately dilated. - Systemic veins: IVC dilated with normal respiratory variation. Estimated CVP 8 mmHg.  Coronary angiography 10/2012:  Coronary dominance: right  Left mainstem: The left main is short. The vessel  is widely patent and arises from the left cusp. It divides into the LAD and left circumflex.  Left anterior descending (LAD): The LAD has minor irregularity. The vessel is patent to the left ventricular apex. The stented segment mid LAD is widely patent without significant in-stent restenosis. There is no obstructive disease throughout the distribution of the LAD. There are 3 patent diagonal branches without obstructive disease.  Left circumflex (LCx): The left circumflex is a large, dominant vessel. There is diffuse luminal irregularity. There were 2 obtuse marginal branches, a left posterolateral branch, and the left PDA.  Right coronary artery (RCA): The RCA is nondominant. The vessel is moderately calcified. There is mild proximal 20-30% stenosis.  Left ventriculography: Left ventricular systolic function is vigorous, with an estimated LVEF of 65-70%. There is marked hypertrophy of the left ventricular apex with obliteration of the distal left ventricular cavity.  Final Conclusions:  1. Continued patency of the stented segment in the LAD  2. Widely patent, dominant left circumflex  3. 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 The patient does have a 20 mm pressure gradient without known aortic valve disease. Will check a cardiac MRI to rule out LV apical scarring and to further evaluate her cardiomyopathy.  Tonny Bollman  11/05/2012, 9:30 AM   CARDIAC MRI IMPRESSION: 1. Unusual hypertrophy pattern involving the anteroseptal wall, the anterior wall, and the apex and peri-apical segments. Normal LV EF. There was also a non-coronary disease pattern of delayed enhancement in the subepicardial basal anterior wall and the apical inferior wall. There was a narrowed LV outflow tract without mitral valve systolic anterior motion. These findings suggest a variant of hypertrophic cardiomyopathy.  2. Normal RV size and systolic  function.  Dalton Mclean   Electronically Signed By: Marca Ancona M.D. On: 12/28/2012 23:13   Assessment / Plan: 1. Hypoxia - her sats still low here in the office today - have encouraged her  to continue her oxygen.  2. Diastolic HF - recheck labs - looks compensated  3. HTN - BP ok  4. Bilateral pleural effusions - recheck CXR today - may need to consider thoracentesis  5. Hypertrophic Cm  6. Bradycardia - reported rates down in the 30's. Will have to cut the metoprolol to just 12.5 BID.   See back in about 10 days with Dr. Excell Seltzer. Overall situation looks tenuous to me.   Patient is agreeable to this plan and will call if any problems develop in the interim.   Rosalio Macadamia, RN, ANP-C [facility] [address] [place], [place]  [address] [phone]

## 2013-05-23 DIAGNOSIS — I2510 Atherosclerotic heart disease of native coronary artery without angina pectoris: Secondary | ICD-10-CM | POA: Diagnosis not present

## 2013-05-23 DIAGNOSIS — I509 Heart failure, unspecified: Secondary | ICD-10-CM | POA: Diagnosis not present

## 2013-05-23 DIAGNOSIS — J441 Chronic obstructive pulmonary disease with (acute) exacerbation: Secondary | ICD-10-CM | POA: Diagnosis not present

## 2013-05-23 DIAGNOSIS — F0390 Unspecified dementia without behavioral disturbance: Secondary | ICD-10-CM | POA: Diagnosis not present

## 2013-05-23 DIAGNOSIS — I5031 Acute diastolic (congestive) heart failure: Secondary | ICD-10-CM | POA: Diagnosis not present

## 2013-05-23 DIAGNOSIS — J45909 Unspecified asthma, uncomplicated: Secondary | ICD-10-CM | POA: Diagnosis not present

## 2013-05-24 DIAGNOSIS — I509 Heart failure, unspecified: Secondary | ICD-10-CM | POA: Diagnosis not present

## 2013-05-24 DIAGNOSIS — I5031 Acute diastolic (congestive) heart failure: Secondary | ICD-10-CM | POA: Diagnosis not present

## 2013-05-24 DIAGNOSIS — J441 Chronic obstructive pulmonary disease with (acute) exacerbation: Secondary | ICD-10-CM

## 2013-05-24 DIAGNOSIS — F0390 Unspecified dementia without behavioral disturbance: Secondary | ICD-10-CM

## 2013-05-27 DIAGNOSIS — F0390 Unspecified dementia without behavioral disturbance: Secondary | ICD-10-CM | POA: Diagnosis not present

## 2013-05-27 DIAGNOSIS — J45909 Unspecified asthma, uncomplicated: Secondary | ICD-10-CM | POA: Diagnosis not present

## 2013-05-27 DIAGNOSIS — J441 Chronic obstructive pulmonary disease with (acute) exacerbation: Secondary | ICD-10-CM | POA: Diagnosis not present

## 2013-05-27 DIAGNOSIS — I509 Heart failure, unspecified: Secondary | ICD-10-CM | POA: Diagnosis not present

## 2013-05-27 DIAGNOSIS — I2510 Atherosclerotic heart disease of native coronary artery without angina pectoris: Secondary | ICD-10-CM | POA: Diagnosis not present

## 2013-05-27 DIAGNOSIS — I5031 Acute diastolic (congestive) heart failure: Secondary | ICD-10-CM | POA: Diagnosis not present

## 2013-05-28 DIAGNOSIS — J441 Chronic obstructive pulmonary disease with (acute) exacerbation: Secondary | ICD-10-CM | POA: Diagnosis not present

## 2013-05-28 DIAGNOSIS — I5031 Acute diastolic (congestive) heart failure: Secondary | ICD-10-CM | POA: Diagnosis not present

## 2013-05-28 DIAGNOSIS — J45909 Unspecified asthma, uncomplicated: Secondary | ICD-10-CM | POA: Diagnosis not present

## 2013-05-28 DIAGNOSIS — I2510 Atherosclerotic heart disease of native coronary artery without angina pectoris: Secondary | ICD-10-CM | POA: Diagnosis not present

## 2013-05-28 DIAGNOSIS — I509 Heart failure, unspecified: Secondary | ICD-10-CM | POA: Diagnosis not present

## 2013-05-28 DIAGNOSIS — F0390 Unspecified dementia without behavioral disturbance: Secondary | ICD-10-CM | POA: Diagnosis not present

## 2013-05-29 DIAGNOSIS — J45909 Unspecified asthma, uncomplicated: Secondary | ICD-10-CM | POA: Diagnosis not present

## 2013-05-29 DIAGNOSIS — I509 Heart failure, unspecified: Secondary | ICD-10-CM | POA: Diagnosis not present

## 2013-05-29 DIAGNOSIS — F0390 Unspecified dementia without behavioral disturbance: Secondary | ICD-10-CM | POA: Diagnosis not present

## 2013-05-29 DIAGNOSIS — I2510 Atherosclerotic heart disease of native coronary artery without angina pectoris: Secondary | ICD-10-CM | POA: Diagnosis not present

## 2013-05-29 DIAGNOSIS — I5031 Acute diastolic (congestive) heart failure: Secondary | ICD-10-CM | POA: Diagnosis not present

## 2013-05-29 DIAGNOSIS — J441 Chronic obstructive pulmonary disease with (acute) exacerbation: Secondary | ICD-10-CM | POA: Diagnosis not present

## 2013-05-31 ENCOUNTER — Ambulatory Visit (INDEPENDENT_AMBULATORY_CARE_PROVIDER_SITE_OTHER): Payer: Medicare Other | Admitting: Nurse Practitioner

## 2013-05-31 ENCOUNTER — Encounter: Payer: Self-pay | Admitting: Nurse Practitioner

## 2013-05-31 VITALS — BP 140/80 | HR 49 | Ht 63.0 in | Wt 184.8 lb

## 2013-05-31 DIAGNOSIS — I5031 Acute diastolic (congestive) heart failure: Secondary | ICD-10-CM | POA: Diagnosis not present

## 2013-05-31 DIAGNOSIS — I10 Essential (primary) hypertension: Secondary | ICD-10-CM | POA: Diagnosis not present

## 2013-05-31 DIAGNOSIS — I509 Heart failure, unspecified: Secondary | ICD-10-CM | POA: Diagnosis not present

## 2013-05-31 DIAGNOSIS — I2510 Atherosclerotic heart disease of native coronary artery without angina pectoris: Secondary | ICD-10-CM

## 2013-05-31 DIAGNOSIS — J441 Chronic obstructive pulmonary disease with (acute) exacerbation: Secondary | ICD-10-CM | POA: Diagnosis not present

## 2013-05-31 DIAGNOSIS — I421 Obstructive hypertrophic cardiomyopathy: Secondary | ICD-10-CM | POA: Diagnosis not present

## 2013-05-31 DIAGNOSIS — F0390 Unspecified dementia without behavioral disturbance: Secondary | ICD-10-CM | POA: Diagnosis not present

## 2013-05-31 DIAGNOSIS — J45909 Unspecified asthma, uncomplicated: Secondary | ICD-10-CM | POA: Diagnosis not present

## 2013-05-31 LAB — BASIC METABOLIC PANEL
BUN: 15 mg/dL (ref 6–23)
CO2: 29 mEq/L (ref 19–32)
Calcium: 8.5 mg/dL (ref 8.4–10.5)
Chloride: 103 mEq/L (ref 96–112)
Creatinine, Ser: 1.4 mg/dL — ABNORMAL HIGH (ref 0.4–1.2)
GFR: 39.9 mL/min — ABNORMAL LOW (ref >60.00)
Glucose, Bld: 97 mg/dL (ref 70–99)
Potassium: 4.3 mEq/L (ref 3.5–5.1)
Sodium: 140 mEq/L (ref 135–145)

## 2013-05-31 LAB — CBC
HCT: 32 % — ABNORMAL LOW (ref 36.0–46.0)
Hemoglobin: 10.5 g/dL — ABNORMAL LOW (ref 12.0–15.0)
MCHC: 32.8 g/dL (ref 30.0–36.0)
MCV: 84.2 fl (ref 78.0–100.0)
Platelets: 222 10*3/uL (ref 150.0–400.0)
RBC: 3.8 Mil/uL — ABNORMAL LOW (ref 3.87–5.11)
RDW: 15 % — ABNORMAL HIGH (ref 11.5–14.6)
WBC: 5.2 10*3/uL (ref 4.5–10.5)

## 2013-05-31 MED ORDER — FUROSEMIDE 80 MG PO TABS: 80.0000 mg | ORAL_TABLET | Freq: Every day | ORAL | Status: DC

## 2013-05-31 NOTE — Patient Instructions (Signed)
Increase the lasix to 80 mg each morning  We will check lab today  Check lab in one week   I will see you in a month  Call the [facility] office at [phone] if you have any questions, problems or concerns.

## 2013-05-31 NOTE — Progress Notes (Signed)
[name] Date of Birth: 04/17/1935 Medical Record #[id number]  History of Present Illness: Ms. [name] is seen back today for a follow up visit. Seen for Dr. Excell Seltzerooper. She is a 78 year old female with CAD with prior stenting of the LAD in 2011 - recent cath has shown patency but with vigorous LV function that was concerning for apical hypertrophic CM - sent for cardiac MRI which did show a narrow LV outflow tract, systolic anterior motion of the mitral valve and subepicardial enhancement on delayed imaging consistent with a variant of hypertrophic CM - on beta blocker therapy.   Her other issues include dementia, HTN, COPD, HLD and diastolic HF. She has been noted to be quite sedentary.   Last seen by Dr. Excell Seltzerooper in November. Beta blocker started due to the findings on the cardiac MRI.   Most recently presented to the ER with worsening dyspnea - found to be hypoxic with sats down in the 70's. Had been noncompliant with diet and fluids. D dimer elevated but CTA chest negative for PE. Did have moderate pleural effusion - left greater than right - was diuresed but not tapped. EF normal but with diastolic dysfunction. Does not look like she had thoracentesis. Was sent home on oxygen therapy.   Seen earlier this month with her grand daughter - [name]. [name] has told me that sometimes Ms. [name] does not really tell you how she is feeling. Ms. [name] said she was "just fine". Not short of breath. No chest pain. Not dizzy or lightheaded. [facility] has come out and noted HR down in the 30's. No falls. She wanted off her oxygen. Came in and sat was 85%. I encouraged her to continue using the oxygen, rechecked her CXR which was ok and cut her metoprolol back due to the bradycardia.  Comes back today. Here with [name] her granddaughter. Has her oxygen on. Weight is up. More swelling than usual and belly more bloated. Salt use continues to be an issue. Had sausage and bacon for breakfast. She is  not short of breath. Wearing her oxgygen. No chest pain. No cough. No orthopnea or PND reported. Taking her medicines. Heart rate has improved.    Current Outpatient Prescriptions  Medication Sig Dispense Refill  . albuterol (PROVENTIL HFA;VENTOLIN HFA) 108 (90 BASE) MCG/ACT inhaler Inhale 2 puffs into the lungs every 6 (six) hours as needed for wheezing.  1 Inhaler  5  . alendronate (FOSAMAX) 70 MG tablet Take 70 mg by mouth every 7 (seven) days. Take with a full glass of water on an empty stomach.      . Alpha-D-Galactosidase (BEANO) TABS Take 2 tablets by mouth 3 times/day as needed-between meals & bedtime.       Marland Kitchen. amLODipine (NORVASC) 10 MG tablet Take 10 mg by mouth every morning.       Marland Kitchen. aspirin 81 MG EC tablet Take 81 mg by mouth every morning.       . budesonide-formoterol (SYMBICORT) 160-4.5 MCG/ACT inhaler Inhale 2 puffs into the lungs 2 (two) times daily.  1 Inhaler  12  . Cholecalciferol (VITAMIN D3) 2000 UNITS TABS Take 1 tablet by mouth every morning.      . citalopram (CELEXA) 40 MG tablet Take 0.5 tablets (20 mg total) by mouth every evening.      . donepezil (ARICEPT) 10 MG tablet Take 10 mg by mouth at bedtime.      Marland Kitchen. erythromycin ophthalmic ointment Apply 1 application to eye  daily as needed.       . furosemide (LASIX) 40 MG tablet Take 1 tablet (40 mg total) by mouth every morning.  30 tablet  1  . LORazepam (ATIVAN) 1 MG tablet Take 0.5-1 mg by mouth at bedtime as needed for anxiety.      Marland Kitchen losartan (COZAAR) 100 MG tablet Take 100 mg by mouth every morning.       . metoprolol tartrate (LOPRESSOR) 25 MG tablet Take 0.5 tablets (12.5 mg total) by mouth 2 (two) times daily.  180 tablet  3  . nitroGLYCERIN (NITROSTAT) 0.4 MG SL tablet Place 0.4 mg under the tongue as needed for chest pain.       . pravastatin (PRAVACHOL) 40 MG tablet Take 40 mg by mouth at bedtime.       Marland Kitchen tiotropium (SPIRIVA) 18 MCG inhalation capsule Place 18 mcg into inhaler and inhale daily.       No  current facility-administered medications for this visit.    Allergies  Allergen Reactions  . Acetaminophen   . Codeine Nausea And Vomiting  . Dilaudid [Hydromorphone Hcl] Other (See Comments)    Extremely sensitive  . Erythromycin Other (See Comments)    Intense stomach pain  . Sulfonamide Derivatives     Past Medical History  Diagnosis Date  . CAD (coronary artery disease) 08/2009    s/p PCI of the LAD  . Myocardial infarction     Non-st segment elevated  . Hypertension   . Hyperlipidemia   . Bradycardia   . Carotid artery stenosis   . COPD (chronic obstructive pulmonary disease)   . Chronic headache   . Asthma   . Pneumonia   . Ulnar neuropathy   . Seasonal allergies   . Impaired fasting glucose   . Dementia     patient denies this  . Osteoporosis 2009    Past Surgical History  Procedure Laterality Date  . Partial hysterectomy  1984  . Radial artery catheter      Bladder  . Bladder surgery  1991  . Cardiac catheterization  11/2010    negative    History  Smoking status  . Former Smoker -- 1.00 packs/day for 35 years  . Quit date: 02/15/2008  Smokeless tobacco  . Never Used    History  Alcohol Use No    Family History  Problem Relation Age of Onset  . Addison's disease Mother   . Alcohol abuse Father     Review of Systems: The review of systems is per the HPI.  All other systems were reviewed and are negative.  Physical Exam: BP 140/80  Pulse 49  Ht 5\' 3"  (1.6 m)  Wt 184 lb 12.8 oz (83.825 kg)  BMI 32.74 kg/m2 Patient is very pleasant and in no acute distress. Skin is warm and dry. Color is normal.  HEENT is unremarkable but has right eye deformity. Normocephalic/atraumatic. PERRL. Sclera are nonicteric. Neck is supple. No masses. No JVD. Lungs are fairly clear. Has her oxygen in place. Cardiac exam shows a regular rate and rhythm. Abdomen is soft. Extremities are with 1+ edema bilaterally. Gait and ROM are intact. No gross neurologic  deficits noted.  Wt Readings from Last 3 Encounters:  05/31/13 184 lb 12.8 oz (83.825 kg)  05/22/13 175 lb 12.8 oz (79.742 kg)  05/14/13 173 lb (78.472 kg)     LABORATORY DATA: PENDING  Lab Results  Component Value Date   WBC 7.4 05/22/2013   HGB 11.3* 05/22/2013  HCT 34.3* 05/22/2013   PLT 172.0 05/22/2013   GLUCOSE 85 05/22/2013   CHOL 136 05/03/2012   TRIG 85 05/03/2012   HDL 43 05/03/2012   LDLCALC 76 05/03/2012   ALT 10 05/07/2013   AST 16 05/07/2013   NA 136 05/22/2013   K 4.0 05/22/2013   CL 96 05/22/2013   CREATININE 1.7* 05/22/2013   BUN 22 05/22/2013   CO2 31 05/22/2013   TSH 0.516 05/08/2013   INR 1.11 05/07/2013   HGBA1C 6.4* 05/03/2012   CTA CHEST IMPRESSION: 1. Moderate bilateral pleural effusions and associated atelectasis. 2. No evidence for pulmonary embolus. 3. Moderate cardiac enlargement. 4. Atherosclerotic disease in the aorta as well as the coronary arteries. There is a 50% stenosis at the origin of the left subclavian artery. 5. Remote compression fractures at T6 and T12.   Electronically Signed By: Gennette Pac M.D. On: 05/07/2013 21:47  Echo Study Conclusions from March 2015  - Procedure narrative: Transthoracic echocardiography. Image quality was suboptimal. - Left ventricle: The cavity size was normal. Systolic function was vigorous. The estimated ejection fraction was in the range of 65% to 70%. Mild posterior wall and severe asymmetric basal septal hypertrophy (1.84 cm). Features are consistent with a pseudonormal left ventricular filling pattern, with concomitant abnormal relaxation and increased filling pressure (grade 2 diastolic dysfunction). Doppler parameters are consistent with high ventricular filling pressure. - Aortic valve: Uncertain number of leaflets. Poorly visualized. Mildly calcified annulus. Mildly thickened leaflets. No transvalvular velocities were obtained. - Mitral valve: Mildly thickened leaflets . Mild mitral annular  calcification. Mild regurgitation. - Left atrium: The atrium was moderately dilated. - Systemic veins: IVC dilated with normal respiratory variation. Estimated CVP 8 mmHg.  Coronary angiography 10/2012:  Coronary dominance: right  Left mainstem: The left main is short. The vessel is widely patent and arises from the left cusp. It divides into the LAD and left circumflex.  Left anterior descending (LAD): The LAD has minor irregularity. The vessel is patent to the left ventricular apex. The stented segment mid LAD is widely patent without significant in-stent restenosis. There is no obstructive disease throughout the distribution of the LAD. There are 3 patent diagonal branches without obstructive disease.  Left circumflex (LCx): The left circumflex is a large, dominant vessel. There is diffuse luminal irregularity. There were 2 obtuse marginal branches, a left posterolateral branch, and the left PDA.  Right coronary artery (RCA): The RCA is nondominant. The vessel is moderately calcified. There is mild proximal 20-30% stenosis.  Left ventriculography: Left ventricular systolic function is vigorous, with an estimated LVEF of 65-70%. There is marked hypertrophy of the left ventricular apex with obliteration of the distal left ventricular cavity.  Final Conclusions:  1. Continued patency of the stented segment in the LAD  2. Widely patent, dominant left circumflex  3. 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 The patient does have a 20 mm pressure gradient without known aortic valve disease. Will check a cardiac MRI to rule out LV apical scarring and to further evaluate her cardiomyopathy.  Tonny Bollman  11/05/2012, 9:30 AM    CARDIAC MRI IMPRESSION: 1. Unusual hypertrophy pattern involving the anteroseptal wall, the anterior wall, and the apex and peri-apical segments. Normal LV EF. There was also a non-coronary disease pattern of  delayed enhancement in the subepicardial basal anterior wall and the apical inferior wall. There was a narrowed LV outflow tract without mitral valve systolic anterior motion. These findings suggest a variant  of hypertrophic cardiomyopathy.  2. Normal RV size and systolic function.  Dalton Mclean   Electronically Signed By: Marca Ancona M.D. On: 12/28/2012 23:13  Dg Chest 2 View  05/22/2013    IMPRESSION: 1. Resolution of right pleural effusion. 2. Decrease in volume of the small remaining left pleural effusion. 3. Stable cardiomegaly   Electronically Signed   By: Dwyane Dee M.D.   On: 05/22/2013 14:17     Assessment / Plan:  1. Hypoxia - still not clear as to the etiology. Would favor keeping her on oxygen therapy for now. Her sat here today is 96% on oxygen.   2. Diastolic HF - weight is up - more swelling on exam - talked with Dr. Excell Seltzer - will increase her Lasix to 80 mg daily. Would like to keep her on this dose - she does already have some kidney impairment. He would like to cut the Losartan back if needed and keep her on the higher dose of diuretic. She will be hard to manage and salt use is going to be inevitable.   3. HTN - BP ok   4. Bilateral pleural effusions - improved on CXR  5. Hypertrophic CM - vigorous EF noted.   6. Bradycardia - improved with cutting back her beta blocker - will leave her on this dose for now.   Lasix is increased today. Check labs today. Check BMET in one week at her PCP and send to [name] - see again in a month. Overall prognosis remains quite tenuous.   Patient is agreeable to this plan and will call if any problems develop in the interim.   Rosalio Macadamia, RN, ANP-C  [facility]  [address]  [place], [place] [address]  [phone]

## 2013-06-02 ENCOUNTER — Other Ambulatory Visit: Payer: Self-pay | Admitting: Family Medicine

## 2013-06-04 DIAGNOSIS — I509 Heart failure, unspecified: Secondary | ICD-10-CM | POA: Diagnosis not present

## 2013-06-04 DIAGNOSIS — I2510 Atherosclerotic heart disease of native coronary artery without angina pectoris: Secondary | ICD-10-CM | POA: Diagnosis not present

## 2013-06-04 DIAGNOSIS — F0390 Unspecified dementia without behavioral disturbance: Secondary | ICD-10-CM | POA: Diagnosis not present

## 2013-06-04 DIAGNOSIS — I5031 Acute diastolic (congestive) heart failure: Secondary | ICD-10-CM | POA: Diagnosis not present

## 2013-06-04 DIAGNOSIS — J45909 Unspecified asthma, uncomplicated: Secondary | ICD-10-CM | POA: Diagnosis not present

## 2013-06-04 DIAGNOSIS — J441 Chronic obstructive pulmonary disease with (acute) exacerbation: Secondary | ICD-10-CM | POA: Diagnosis not present

## 2013-06-07 ENCOUNTER — Other Ambulatory Visit: Payer: Self-pay | Admitting: Nurse Practitioner

## 2013-06-07 DIAGNOSIS — I509 Heart failure, unspecified: Secondary | ICD-10-CM | POA: Diagnosis not present

## 2013-06-07 DIAGNOSIS — J441 Chronic obstructive pulmonary disease with (acute) exacerbation: Secondary | ICD-10-CM | POA: Diagnosis not present

## 2013-06-07 DIAGNOSIS — I5031 Acute diastolic (congestive) heart failure: Secondary | ICD-10-CM | POA: Diagnosis not present

## 2013-06-07 DIAGNOSIS — J45909 Unspecified asthma, uncomplicated: Secondary | ICD-10-CM | POA: Diagnosis not present

## 2013-06-07 DIAGNOSIS — I2510 Atherosclerotic heart disease of native coronary artery without angina pectoris: Secondary | ICD-10-CM | POA: Diagnosis not present

## 2013-06-07 DIAGNOSIS — F0390 Unspecified dementia without behavioral disturbance: Secondary | ICD-10-CM | POA: Diagnosis not present

## 2013-06-07 LAB — BASIC METABOLIC PANEL
BUN: 23 mg/dL (ref 6–23)
CO2: 31 mEq/L (ref 19–32)
Calcium: 8.8 mg/dL (ref 8.4–10.5)
Chloride: 101 mEq/L (ref 96–112)
Creat: 1.29 mg/dL — ABNORMAL HIGH (ref 0.50–1.10)
Glucose, Bld: 114 mg/dL — ABNORMAL HIGH (ref 70–99)
Potassium: 4.6 mEq/L (ref 3.5–5.3)
Sodium: 141 mEq/L (ref 135–145)

## 2013-06-10 DIAGNOSIS — I509 Heart failure, unspecified: Secondary | ICD-10-CM | POA: Diagnosis not present

## 2013-06-10 DIAGNOSIS — J45909 Unspecified asthma, uncomplicated: Secondary | ICD-10-CM | POA: Diagnosis not present

## 2013-06-10 DIAGNOSIS — I2510 Atherosclerotic heart disease of native coronary artery without angina pectoris: Secondary | ICD-10-CM | POA: Diagnosis not present

## 2013-06-10 DIAGNOSIS — I5031 Acute diastolic (congestive) heart failure: Secondary | ICD-10-CM | POA: Diagnosis not present

## 2013-06-10 DIAGNOSIS — J441 Chronic obstructive pulmonary disease with (acute) exacerbation: Secondary | ICD-10-CM | POA: Diagnosis not present

## 2013-06-10 DIAGNOSIS — F0390 Unspecified dementia without behavioral disturbance: Secondary | ICD-10-CM | POA: Diagnosis not present

## 2013-06-11 ENCOUNTER — Encounter: Payer: Self-pay | Admitting: Family Medicine

## 2013-06-11 ENCOUNTER — Ambulatory Visit (INDEPENDENT_AMBULATORY_CARE_PROVIDER_SITE_OTHER): Payer: Medicare Other | Admitting: Family Medicine

## 2013-06-11 VITALS — BP 124/72 | Ht 63.5 in | Wt 182.0 lb

## 2013-06-11 DIAGNOSIS — I5031 Acute diastolic (congestive) heart failure: Secondary | ICD-10-CM

## 2013-06-11 DIAGNOSIS — I2510 Atherosclerotic heart disease of native coronary artery without angina pectoris: Secondary | ICD-10-CM | POA: Diagnosis not present

## 2013-06-11 DIAGNOSIS — K130 Diseases of lips: Secondary | ICD-10-CM

## 2013-06-11 DIAGNOSIS — J441 Chronic obstructive pulmonary disease with (acute) exacerbation: Secondary | ICD-10-CM

## 2013-06-11 DIAGNOSIS — D509 Iron deficiency anemia, unspecified: Secondary | ICD-10-CM

## 2013-06-11 DIAGNOSIS — E782 Mixed hyperlipidemia: Secondary | ICD-10-CM

## 2013-06-11 DIAGNOSIS — E119 Type 2 diabetes mellitus without complications: Secondary | ICD-10-CM

## 2013-06-11 MED ORDER — CITALOPRAM HYDROBROMIDE 40 MG PO TABS: 40.0000 mg | ORAL_TABLET | Freq: Every evening | ORAL | Status: DC

## 2013-06-11 MED ORDER — NITROGLYCERIN 0.4 MG SL SUBL: 0.4000 mg | SUBLINGUAL_TABLET | SUBLINGUAL | Status: DC | PRN

## 2013-06-11 MED ORDER — KETOCONAZOLE 2 % EX CREA: 1.0000 "application " | TOPICAL_CREAM | Freq: Two times a day (BID) | CUTANEOUS | Status: DC

## 2013-06-11 NOTE — Progress Notes (Signed)
   Subjective:    Patient ID: [name], female    DOB: Jan 10, 1936, 78 y.o.   MRN: [id number]  HPIMed check up.   Wheezing in the am.   Concerns about fluid restrictions.   Swelling in both legs worse in left leg. Started while in hospital.  Celexa was changed in hospital to 20mg . Pt was taking 40mg .   Refill on nitrostat. Has not had to use but rx at home is about to expire.  Long discussion held regarding each one of these items. Patient is on oxygen. Greater and 25 minutes spent with the family and the patient Review of Systems Patient denies chest tightness pressure pain shortness breath nausea vomiting she does relate shortness of breath with activity.    Objective:   Physical Exam Bilateral think expiratory wheezes not rest for distress trace edema in his or her legs abdomen soft heart regular pulse normal BP good skin warm dry neurologic grossly normal       Assessment & Plan:  1. Acute diastolic heart failure Patient's best bet is to try to stay physically active keep her weight in check and follow the medications as cardiology has dictated. Patient does not have to dramatically avoid fluids but she should avoid overhydration 2-3 L fluid maximum per day  2. COPD exacerbation Patient takes her medicine on regular basis she was encouraged to do so in order to keep her COPD under decent control she does require her oxygen. Please see previous hospital notes.  3. Angular cheilitis We will use Nizoral this will help with the break out at the corners of her mouth  4. Anemia, iron deficiency I am concerned about the possibility are deficient anemia her hemoglobin dropped her MCV looks normal but with the recent hospitalization the drop in hemoglobin and creating I a.c. she could have iron deficiency - Ferritin - Iron Binding Cap (TIBC)  5. Diabetes Patient has history diabetes she needs to watch her diet and go ahead and check lab work - Hemoglobin A1c  6. Mixed  hyperlipidemia History hyperlipidemia watch diet check lab - Lipid panel   Increase her Celexa back to 40 mg. Refill on Nitrostat given.

## 2013-06-12 LAB — LIPID PANEL
Cholesterol: 147 mg/dL (ref 0–200)
HDL: 39 mg/dL — ABNORMAL LOW (ref >39)
LDL CALC: 85 mg/dL (ref 0–99)
TRIGLYCERIDES: 115 mg/dL (ref <150)
Total CHOL/HDL Ratio: 3.8 Ratio
VLDL: 23 mg/dL (ref 0–40)

## 2013-06-12 LAB — FERRITIN: FERRITIN: 55 ng/mL (ref 10–291)

## 2013-06-12 LAB — IRON AND TIBC
%SAT: 19 % — AB (ref 20–55)
Iron: 68 ug/dL (ref 42–145)
TIBC: 357 ug/dL (ref 250–470)
UIBC: 289 ug/dL (ref 125–400)

## 2013-06-12 LAB — HEMOGLOBIN A1C
HEMOGLOBIN A1C: 6.4 % — AB (ref <5.7)
MEAN PLASMA GLUCOSE: 137 mg/dL — AB (ref <117)

## 2013-06-18 DIAGNOSIS — F0390 Unspecified dementia without behavioral disturbance: Secondary | ICD-10-CM | POA: Diagnosis not present

## 2013-06-18 DIAGNOSIS — I2510 Atherosclerotic heart disease of native coronary artery without angina pectoris: Secondary | ICD-10-CM | POA: Diagnosis not present

## 2013-06-18 DIAGNOSIS — J441 Chronic obstructive pulmonary disease with (acute) exacerbation: Secondary | ICD-10-CM | POA: Diagnosis not present

## 2013-06-18 DIAGNOSIS — I509 Heart failure, unspecified: Secondary | ICD-10-CM | POA: Diagnosis not present

## 2013-06-18 DIAGNOSIS — I5031 Acute diastolic (congestive) heart failure: Secondary | ICD-10-CM | POA: Diagnosis not present

## 2013-06-18 DIAGNOSIS — J45909 Unspecified asthma, uncomplicated: Secondary | ICD-10-CM | POA: Diagnosis not present

## 2013-06-24 ENCOUNTER — Other Ambulatory Visit: Payer: Self-pay | Admitting: *Deleted

## 2013-06-24 DIAGNOSIS — D649 Anemia, unspecified: Secondary | ICD-10-CM

## 2013-06-24 LAB — POC HEMOCCULT BLD/STL (HOME/3-CARD/SCREEN)
Card #2 Fecal Occult Blod, POC: NEGATIVE
Card #3 Fecal Occult Blood, POC: NEGATIVE
Fecal Occult Blood, POC: NEGATIVE

## 2013-07-06 ENCOUNTER — Other Ambulatory Visit: Payer: Self-pay | Admitting: Family Medicine

## 2013-07-07 ENCOUNTER — Other Ambulatory Visit: Payer: Self-pay | Admitting: Family Medicine

## 2013-07-09 DIAGNOSIS — J45909 Unspecified asthma, uncomplicated: Secondary | ICD-10-CM | POA: Diagnosis not present

## 2013-07-09 DIAGNOSIS — I509 Heart failure, unspecified: Secondary | ICD-10-CM | POA: Diagnosis not present

## 2013-07-09 DIAGNOSIS — I5031 Acute diastolic (congestive) heart failure: Secondary | ICD-10-CM | POA: Diagnosis not present

## 2013-07-09 DIAGNOSIS — J441 Chronic obstructive pulmonary disease with (acute) exacerbation: Secondary | ICD-10-CM | POA: Diagnosis not present

## 2013-07-09 DIAGNOSIS — I2510 Atherosclerotic heart disease of native coronary artery without angina pectoris: Secondary | ICD-10-CM | POA: Diagnosis not present

## 2013-07-09 DIAGNOSIS — F0390 Unspecified dementia without behavioral disturbance: Secondary | ICD-10-CM | POA: Diagnosis not present

## 2013-07-09 NOTE — Telephone Encounter (Signed)
Do we want this patient on 5mg  or 10mg  of amlodipine?

## 2013-07-09 NOTE — Telephone Encounter (Signed)
This patient was sent home from the hospital in March on 10 mg amlodipine. It is hard to tell if she was taking the 10 mg from that time until now or using her 5 mg. It would be best to talk with one of her daughters who would check the bottle. If she was using 5 mg since hospitalization it would be best to refill it as 5 mg. She may have that +5 additional refills. In addition to that she may also have pravastatin with 5 refills. Also she would need to followup with [name] for her next regular checkup. And please clarify this and make sure the med list accurately reflects what she is taking. If she had been taking the 10 mg it would be fine to refill it. Please document thank you

## 2013-07-10 ENCOUNTER — Encounter: Payer: Self-pay | Admitting: Nurse Practitioner

## 2013-07-10 ENCOUNTER — Telehealth: Payer: Self-pay | Admitting: Family Medicine

## 2013-07-10 ENCOUNTER — Ambulatory Visit (INDEPENDENT_AMBULATORY_CARE_PROVIDER_SITE_OTHER): Payer: Medicare Other | Admitting: Nurse Practitioner

## 2013-07-10 VITALS — BP 120/60 | HR 47 | Ht 62.0 in | Wt 181.8 lb

## 2013-07-10 DIAGNOSIS — I2510 Atherosclerotic heart disease of native coronary artery without angina pectoris: Secondary | ICD-10-CM

## 2013-07-10 DIAGNOSIS — I5032 Chronic diastolic (congestive) heart failure: Secondary | ICD-10-CM | POA: Diagnosis not present

## 2013-07-10 DIAGNOSIS — I779 Disorder of arteries and arterioles, unspecified: Secondary | ICD-10-CM | POA: Diagnosis not present

## 2013-07-10 DIAGNOSIS — I739 Peripheral vascular disease, unspecified: Secondary | ICD-10-CM

## 2013-07-10 LAB — BASIC METABOLIC PANEL
BUN: 29 mg/dL — ABNORMAL HIGH (ref 6–23)
CO2: 28 mEq/L (ref 19–32)
Calcium: 8.8 mg/dL (ref 8.4–10.5)
Chloride: 100 mEq/L (ref 96–112)
Creatinine, Ser: 1.8 mg/dL — ABNORMAL HIGH (ref 0.4–1.2)
GFR: 28.74 mL/min — ABNORMAL LOW (ref >60.00)
Glucose, Bld: 97 mg/dL (ref 70–99)
Potassium: 4.4 mEq/L (ref 3.5–5.1)
Sodium: 138 mEq/L (ref 135–145)

## 2013-07-10 MED ORDER — NITROGLYCERIN 0.4 MG SL SUBL: 0.4000 mg | SUBLINGUAL_TABLET | SUBLINGUAL | Status: DC | PRN

## 2013-07-10 NOTE — Telephone Encounter (Signed)
Pt needs an eval for O2, so she can get a smaller tank [facility] is stating that they need this in order  To change the tank. You can just fax over the letter of evaluation Of home oxygen smaller tank.   If you have any questions call Misty Stanley

## 2013-07-10 NOTE — Patient Instructions (Signed)
We will check lab today  See Dr. Excell Seltzer in 3 months  Try to restrict salt as much as possible  Try to wear the oxygen as much as possible - talk to the agency about the "pocket book" type - I think this is called Helios/M6  Stay on current medicines for now.  Call the [facility] office at [phone] if you have any questions, problems or concerns.

## 2013-07-10 NOTE — Progress Notes (Signed)
Baird KayMargie K Bodie Date of Birth: 29-May-1935 Medical Record #[id number]  History of Present Illness: Ms. Carey BullocksJarrell is seen back today for a follow up visit. This is a one month check. Seen for Dr. Excell Seltzerooper. She is a 78 year old female with CAD with prior stenting of the LAD in 2011 - recent cath has shown patency but with vigorous LV function that was concerning for apical hypertrophic CM - sent for cardiac MRI which did show a narrow LV outflow tract, systolic anterior motion of the mitral valve and subepicardial enhancement on delayed imaging consistent with a variant of hypertrophic CM - on beta blocker therapy.   Her other issues include dementia, HTN, COPD, HLD and diastolic HF. She has been noted to be quite sedentary.   Last seen by Dr. Excell Seltzerooper in November of 2014. Beta blocker started due to the findings on the cardiac MRI.   Admitted this past March with worsening dyspnea - found to be hypoxic with sats down in the 70's. Had been noncompliant with diet and fluids. D dimer elevated but CTA chest negative for PE. Did have moderate pleural effusion - left greater than right - was diuresed but not tapped. EF normal but with diastolic dysfunction. Does not look like she had thoracentesis. Was sent home on oxygen therapy.   I have seen her back a couple of times since her last admission. History probably not reliable from the patient according to her grand daughter Kennyth Arnold- Stacy. Had had low HR - down in the 30's by home health. Beta blocker cut back. Has had more swelling - salt will be and continues to be an issue. She was not short of breath.   Comes back today. Here with Stacy her granddaughter. She says she is "doing fine". Not short of breath. Not dizzy or lightheaded. No passing out. Wearing her oxygen but really wants to stop. No chest pain. Swelling at times. Still with too much salt - had sausage this morning. Weight up and down but down here today.   Current Outpatient Prescriptions  Medication  Sig Dispense Refill  . albuterol (PROVENTIL HFA;VENTOLIN HFA) 108 (90 BASE) MCG/ACT inhaler Inhale 2 puffs into the lungs every 6 (six) hours as needed for wheezing.  1 Inhaler  5  . alendronate (FOSAMAX) 70 MG tablet Take 70 mg by mouth every 7 (seven) days. Take with a full glass of water on an empty stomach.      . Alpha-D-Galactosidase (BEANO) TABS Take 2 tablets by mouth 3 times/day as needed-between meals & bedtime.       Marland Kitchen. amLODipine (NORVASC) 10 MG tablet Take 10 mg by mouth every morning.       Marland Kitchen. amLODipine (NORVASC) 5 MG tablet TAKE 1 TABLET BY MOUTH EVERY MORNING  90 tablet  1  . aspirin 81 MG EC tablet Take 81 mg by mouth every morning.       . budesonide-formoterol (SYMBICORT) 160-4.5 MCG/ACT inhaler Inhale 2 puffs into the lungs 2 (two) times daily.  1 Inhaler  12  . Cholecalciferol (VITAMIN D3) 2000 UNITS TABS Take 1 tablet by mouth every morning.      . citalopram (CELEXA) 40 MG tablet TAKE 1 TABLET BY MOUTH EVERY DAY  90 tablet  0  . donepezil (ARICEPT) 10 MG tablet Take 10 mg by mouth at bedtime.      Marland Kitchen. erythromycin ophthalmic ointment Apply 1 application to eye daily as needed.       . furosemide (LASIX) 80  MG tablet Take 1 tablet (80 mg total) by mouth daily.  90 tablet  3  . ketoconazole (NIZORAL) 2 % cream Apply 1 application topically 2 (two) times daily. Apply BID for 7 days to corners of mouth  30 g  0  . LORazepam (ATIVAN) 1 MG tablet Take 0.5-1 mg by mouth at bedtime as needed for anxiety.      Marland Kitchen losartan (COZAAR) 100 MG tablet Take 100 mg by mouth every morning.       . metoprolol tartrate (LOPRESSOR) 25 MG tablet Take 0.5 tablets (12.5 mg total) by mouth 2 (two) times daily.  180 tablet  3  . nitroGLYCERIN (NITROSTAT) 0.4 MG SL tablet Place 1 tablet (0.4 mg total) under the tongue as needed for chest pain.  20 tablet  0  . pravastatin (PRAVACHOL) 40 MG tablet TAKE 1 TABLET BY MOUTH EVERY DAY  90 tablet  1  . SPIRIVA HANDIHALER 18 MCG inhalation capsule INHALE THE  CONTENTS OF 1 CAPSULE VIA INHALER ONCE DAILY  30 capsule  5   No current facility-administered medications for this visit.    Allergies  Allergen Reactions  . Acetaminophen   . Codeine Nausea And Vomiting  . Dilaudid [Hydromorphone Hcl] Other (See Comments)    Extremely sensitive  . Erythromycin Other (See Comments)    Intense stomach pain  . Sulfonamide Derivatives     Past Medical History  Diagnosis Date  . CAD (coronary artery disease) 08/2009    s/p PCI of the LAD  . Myocardial infarction     Non-st segment elevated  . Hypertension   . Hyperlipidemia   . Bradycardia   . Carotid artery stenosis   . COPD (chronic obstructive pulmonary disease)   . Chronic headache   . Asthma   . Pneumonia   . Ulnar neuropathy   . Seasonal allergies   . Impaired fasting glucose   . Dementia     patient denies this  . Osteoporosis 2009    Past Surgical History  Procedure Laterality Date  . Partial hysterectomy  1984  . Radial artery catheter      Bladder  . Bladder surgery  1991  . Cardiac catheterization  11/2010    negative    History  Smoking status  . Former Smoker -- 1.00 packs/day for 35 years  . Quit date: 02/15/2008  Smokeless tobacco  . Never Used    History  Alcohol Use No    Family History  Problem Relation Age of Onset  . Addison's disease Mother   . Alcohol abuse Father     Review of Systems: The review of systems is per the HPI.  All other systems were reviewed and are negative.  Physical Exam: BP 120/60  Pulse 47  Ht 5\' 2"  (1.575 m)  Wt 181 lb 12.8 oz (82.464 kg)  BMI 33.24 kg/m2  SpO2 88% Patient is very pleasant and in no acute distress. Skin is warm and dry. Color is normal.  HEENT is unremarkable. Normocephalic/atraumatic. PERRL. Sclera are nonicteric. Neck is supple. No masses. No JVD. Lungs are clear. Cardiac exam shows a regular rate and rhythm. Abdomen is soft. Extremities are without edema. Gait and ROM are intact. No gross neurologic  deficits noted.  Wt Readings from Last 3 Encounters:  07/10/13 181 lb 12.8 oz (82.464 kg)  06/11/13 182 lb (82.555 kg)  05/31/13 184 lb 12.8 oz (83.825 kg)     LABORATORY DATA: BMET pending  Lab Results  Component  Value Date   WBC 5.2 05/31/2013   HGB 10.5* 05/31/2013   HCT 32.0* 05/31/2013   PLT 222.0 05/31/2013   GLUCOSE 114* 06/07/2013   CHOL 147 06/11/2013   TRIG 115 06/11/2013   HDL 39* 06/11/2013   LDLCALC 85 06/11/2013   ALT 10 05/07/2013   AST 16 05/07/2013   NA 141 06/07/2013   K 4.6 06/07/2013   CL 101 06/07/2013   CREATININE 1.29* 06/07/2013   BUN 23 06/07/2013   CO2 31 06/07/2013   TSH 0.516 05/08/2013   INR 1.11 05/07/2013   HGBA1C 6.4* 06/11/2013   CTA CHEST IMPRESSION: 1. Moderate bilateral pleural effusions and associated atelectasis. 2. No evidence for pulmonary embolus. 3. Moderate cardiac enlargement. 4. Atherosclerotic disease in the aorta as well as the coronary arteries. There is a 50% stenosis at the origin of the left subclavian artery. 5. Remote compression fractures at T6 and T12.   Electronically Signed By: Gennette Pac M.D. On: 05/07/2013 21:47  Echo Study Conclusions from March 2015  - Procedure narrative: Transthoracic echocardiography. Image quality was suboptimal. - Left ventricle: The cavity size was normal. Systolic function was vigorous. The estimated ejection fraction was in the range of 65% to 70%. Mild posterior wall and severe asymmetric basal septal hypertrophy (1.84 cm). Features are consistent with a pseudonormal left ventricular filling pattern, with concomitant abnormal relaxation and increased filling pressure (grade 2 diastolic dysfunction). Doppler parameters are consistent with high ventricular filling pressure. - Aortic valve: Uncertain number of leaflets. Poorly visualized. Mildly calcified annulus. Mildly thickened leaflets. No transvalvular velocities were obtained. - Mitral valve: Mildly thickened leaflets . Mild  mitral annular calcification. Mild regurgitation. - Left atrium: The atrium was moderately dilated. - Systemic veins: IVC dilated with normal respiratory variation. Estimated CVP 8 mmHg.  Coronary angiography 10/2012:  Coronary dominance: right  Left mainstem: The left main is short. The vessel is widely patent and arises from the left cusp. It divides into the LAD and left circumflex.  Left anterior descending (LAD): The LAD has minor irregularity. The vessel is patent to the left ventricular apex. The stented segment mid LAD is widely patent without significant in-stent restenosis. There is no obstructive disease throughout the distribution of the LAD. There are 3 patent diagonal branches without obstructive disease.  Left circumflex (LCx): The left circumflex is a large, dominant vessel. There is diffuse luminal irregularity. There were 2 obtuse marginal branches, a left posterolateral branch, and the left PDA.  Right coronary artery (RCA): The RCA is nondominant. The vessel is moderately calcified. There is mild proximal 20-30% stenosis.  Left ventriculography: Left ventricular systolic function is vigorous, with an estimated LVEF of 65-70%. There is marked hypertrophy of the left ventricular apex with obliteration of the distal left ventricular cavity.  Final Conclusions:  1. Continued patency of the stented segment in the LAD  2. Widely patent, dominant left circumflex  3. 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 The patient does have a 20 mm pressure gradient without known aortic valve disease. Will check a cardiac MRI to rule out LV apical scarring and to further evaluate her cardiomyopathy.  Tonny Bollman  11/05/2012, 9:30 AM    CARDIAC MRI IMPRESSION: 1. Unusual hypertrophy pattern involving the anteroseptal wall, the anterior wall, and the apex and peri-apical segments. Normal LV EF. There was also a non-coronary disease  pattern of delayed enhancement in the subepicardial basal anterior wall and the apical inferior wall. There was a narrowed  LV outflow tract without mitral valve systolic anterior motion. These findings suggest a variant of hypertrophic cardiomyopathy.  2. Normal RV size and systolic function.  Dalton Mclean   Electronically Signed By: Marca Ancona M.D. On: 12/28/2012 23:13  Dg Chest 2 View  05/22/2013 IMPRESSION: 1. Resolution of right pleural effusion. 2. Decrease in volume of the small remaining left pleural effusion. 3. Stable cardiomegaly Electronically Signed By: Dwyane Dee M.D. On: 05/22/2013 14:17    Assessment / Plan:  1. Hypoxia - still not clear as to the etiology. Would favor keeping her on oxygen therapy for now. She did drop her sat to 88% with walking here into the office this morning - then up to 97%.   2. Diastolic HF - will leave her on her current regimen. Recheck her lab today. Would still favor cutting the ARB back if needed since salt is and will always be an issue.   3. HTN - BP ok   4. Bilateral pleural effusions - improved on past CXR   5. Hypertrophic CM - vigorous EF noted.   6. Bradycardia - improved with cutting back her beta blocker - will leave her on this dose for now. She is totally asymptomatic.   See her back in 4 months. Arrange for carotid doppler at next OV as well. Check lab today.   Patient is agreeable to this plan and will call if any problems develop in the interim.  Rosalio Macadamia, RN, ANP-C  [facility]  [address]  [place], [place] [address]  [phone]

## 2013-07-11 NOTE — Telephone Encounter (Signed)
I am not certain why this is needed or what specifically I need to order. I wrote up the order that I hope will help but please talk with Kennyth Arnold try to clarify make any changes to the letter as necessary then fax it to [facility]

## 2013-07-11 NOTE — Telephone Encounter (Signed)
Please write this order out I will be happy to sign it

## 2013-07-11 NOTE — Telephone Encounter (Signed)
Patient has one of the smallest tanks available per home health. They would need order stating medically necessary to be evaluated by respiratory for possibility of smaller tank They will evaluated and decide if different tank is necessary. Need order faxed to [phone]

## 2013-07-11 NOTE — Telephone Encounter (Signed)
Order faxed to [facility].

## 2013-07-12 ENCOUNTER — Other Ambulatory Visit: Payer: Self-pay | Admitting: Family Medicine

## 2013-07-14 NOTE — Telephone Encounter (Signed)
May refill this and 4 additional refills 

## 2013-08-12 DIAGNOSIS — J441 Chronic obstructive pulmonary disease with (acute) exacerbation: Secondary | ICD-10-CM

## 2013-08-12 DIAGNOSIS — I5031 Acute diastolic (congestive) heart failure: Secondary | ICD-10-CM | POA: Diagnosis not present

## 2013-08-12 DIAGNOSIS — I509 Heart failure, unspecified: Secondary | ICD-10-CM

## 2013-08-12 DIAGNOSIS — F0390 Unspecified dementia without behavioral disturbance: Secondary | ICD-10-CM | POA: Diagnosis not present

## 2013-08-21 ENCOUNTER — Other Ambulatory Visit: Payer: Self-pay | Admitting: Family Medicine

## 2013-08-28 ENCOUNTER — Encounter: Payer: Self-pay | Admitting: Family Medicine

## 2013-08-28 ENCOUNTER — Ambulatory Visit (INDEPENDENT_AMBULATORY_CARE_PROVIDER_SITE_OTHER): Payer: Medicare Other | Admitting: Family Medicine

## 2013-08-28 VITALS — BP 128/82 | Ht 63.5 in | Wt 182.0 lb

## 2013-08-28 DIAGNOSIS — I2510 Atherosclerotic heart disease of native coronary artery without angina pectoris: Secondary | ICD-10-CM | POA: Diagnosis not present

## 2013-08-28 DIAGNOSIS — M8580 Other specified disorders of bone density and structure, unspecified site: Secondary | ICD-10-CM

## 2013-08-28 DIAGNOSIS — Z23 Encounter for immunization: Secondary | ICD-10-CM | POA: Diagnosis not present

## 2013-08-28 DIAGNOSIS — J449 Chronic obstructive pulmonary disease, unspecified: Secondary | ICD-10-CM | POA: Diagnosis not present

## 2013-08-28 DIAGNOSIS — M949 Disorder of cartilage, unspecified: Secondary | ICD-10-CM | POA: Diagnosis not present

## 2013-08-28 DIAGNOSIS — M899 Disorder of bone, unspecified: Secondary | ICD-10-CM | POA: Diagnosis not present

## 2013-08-28 DIAGNOSIS — Z1231 Encounter for screening mammogram for malignant neoplasm of breast: Secondary | ICD-10-CM | POA: Diagnosis not present

## 2013-08-28 DIAGNOSIS — R0902 Hypoxemia: Secondary | ICD-10-CM

## 2013-08-28 MED ORDER — TIOTROPIUM BROMIDE MONOHYDRATE 18 MCG IN CAPS: ORAL_CAPSULE | RESPIRATORY_TRACT | Status: DC

## 2013-08-28 MED ORDER — BUDESONIDE-FORMOTEROL FUMARATE 160-4.5 MCG/ACT IN AERO: 2.0000 | INHALATION_SPRAY | Freq: Two times a day (BID) | RESPIRATORY_TRACT | Status: DC

## 2013-08-28 NOTE — Progress Notes (Signed)
   Subjective:    Patient ID: [name], female    DOB: [birth year], 78 y.o.   MRN: [id number]  HPI  Patient arrives to recert her oxygen. Patient is currently on 2 liters continuously via nasal cannula. Patient's O2 sat 81% resting at room air.  Patient denies high fever chills nausea vomiting diarrhea has COPD he has osteopenia here for certification on O2 Review of Systems    see above Objective:   Physical Exam  Lungs clear heart regular pulse normal extremities no edema skin warm dry      Assessment & Plan:  COPD stable continue current medications  Hypoxia related to COPD chronic. Qualifies for O2. Continue 2 L.  Osteopenia bone density  Immunization updated  Mammogram ordered

## 2013-09-02 ENCOUNTER — Ambulatory Visit (HOSPITAL_COMMUNITY)
Admission: RE | Admit: 2013-09-02 | Discharge: 2013-09-02 | Disposition: A | Discharge status: Home / Self Care | Payer: Medicare Other | Source: Ambulatory Visit | Attending: Family Medicine | Admitting: Family Medicine

## 2013-09-02 DIAGNOSIS — Z1231 Encounter for screening mammogram for malignant neoplasm of breast: Secondary | ICD-10-CM | POA: Insufficient documentation

## 2013-09-04 ENCOUNTER — Ambulatory Visit (HOSPITAL_COMMUNITY)
Admission: RE | Admit: 2013-09-04 | Discharge: 2013-09-04 | Disposition: A | Discharge status: Home / Self Care | Payer: Medicare Other | Source: Ambulatory Visit | Attending: Family Medicine | Admitting: Family Medicine

## 2013-09-04 DIAGNOSIS — M899 Disorder of bone, unspecified: Secondary | ICD-10-CM | POA: Insufficient documentation

## 2013-09-04 DIAGNOSIS — M949 Disorder of cartilage, unspecified: Principal | ICD-10-CM

## 2013-09-04 DIAGNOSIS — Z780 Asymptomatic menopausal state: Secondary | ICD-10-CM | POA: Diagnosis not present

## 2013-09-16 ENCOUNTER — Ambulatory Visit: Payer: Medicare Other | Admitting: Family Medicine

## 2013-09-22 ENCOUNTER — Other Ambulatory Visit: Payer: Self-pay | Admitting: Family Medicine

## 2013-11-06 ENCOUNTER — Ambulatory Visit (HOSPITAL_COMMUNITY): Payer: Medicare Other | Attending: Cardiology | Admitting: Cardiology

## 2013-11-06 ENCOUNTER — Ambulatory Visit (INDEPENDENT_AMBULATORY_CARE_PROVIDER_SITE_OTHER): Payer: Medicare Other | Admitting: Cardiovascular Disease

## 2013-11-06 ENCOUNTER — Encounter: Payer: Self-pay | Admitting: Cardiovascular Disease

## 2013-11-06 VITALS — BP 158/68 | HR 52 | Ht 63.5 in | Wt 183.8 lb

## 2013-11-06 DIAGNOSIS — I422 Other hypertrophic cardiomyopathy: Secondary | ICD-10-CM

## 2013-11-06 DIAGNOSIS — I779 Disorder of arteries and arterioles, unspecified: Secondary | ICD-10-CM

## 2013-11-06 DIAGNOSIS — I5031 Acute diastolic (congestive) heart failure: Secondary | ICD-10-CM | POA: Diagnosis not present

## 2013-11-06 DIAGNOSIS — I739 Peripheral vascular disease, unspecified: Secondary | ICD-10-CM

## 2013-11-06 DIAGNOSIS — I6529 Occlusion and stenosis of unspecified carotid artery: Secondary | ICD-10-CM | POA: Diagnosis not present

## 2013-11-06 DIAGNOSIS — I509 Heart failure, unspecified: Secondary | ICD-10-CM

## 2013-11-06 DIAGNOSIS — J449 Chronic obstructive pulmonary disease, unspecified: Secondary | ICD-10-CM | POA: Diagnosis not present

## 2013-11-06 DIAGNOSIS — E785 Hyperlipidemia, unspecified: Secondary | ICD-10-CM | POA: Insufficient documentation

## 2013-11-06 DIAGNOSIS — J4489 Other specified chronic obstructive pulmonary disease: Secondary | ICD-10-CM | POA: Diagnosis not present

## 2013-11-06 DIAGNOSIS — I421 Obstructive hypertrophic cardiomyopathy: Secondary | ICD-10-CM | POA: Diagnosis not present

## 2013-11-06 DIAGNOSIS — I2510 Atherosclerotic heart disease of native coronary artery without angina pectoris: Secondary | ICD-10-CM

## 2013-11-06 DIAGNOSIS — Z87891 Personal history of nicotine dependence: Secondary | ICD-10-CM | POA: Diagnosis not present

## 2013-11-06 DIAGNOSIS — I10 Essential (primary) hypertension: Secondary | ICD-10-CM | POA: Insufficient documentation

## 2013-11-06 DIAGNOSIS — I5032 Chronic diastolic (congestive) heart failure: Secondary | ICD-10-CM

## 2013-11-06 MED ORDER — LOSARTAN POTASSIUM 50 MG PO TABS: 50.0000 mg | ORAL_TABLET | Freq: Every day | ORAL | Status: DC

## 2013-11-06 NOTE — Progress Notes (Signed)
Carotid duplex performed 

## 2013-11-06 NOTE — Progress Notes (Signed)
HPI:  78 year old woman presenting for followup evaluation. He has coronary artery disease status post PCI of the LAD in 2011. She underwent cardiac catheterization last year demonstrating patency of her stent site. There was no other evidence of obstructive disease noted. Ventriculography was suggestive of apical hypertrophy. A cardiac MRI suggests a variant of hypertrophic cardiomyopathy as well. The patient is now O2 dependent. She continues to have shortness of breath and chest discomfort with activity. Symptoms are essentially unchanged. She remains quite sedentary. She's had no leg swelling, orthopnea, or PND. She denies lightheadedness or syncope.  Outpatient Encounter Prescriptions as of 11/06/2013  Medication Sig  . alendronate (FOSAMAX) 70 MG tablet Take 70 mg by mouth every 7 (seven) days. Take with a full glass of water on an empty stomach.  . Alpha-D-Galactosidase (BEANO) TABS Take 2 tablets by mouth 3 times/day as needed-between meals & bedtime.   Marland Kitchen amLODipine (NORVASC) 10 MG tablet Take 10 mg by mouth every morning.   Marland Kitchen aspirin 81 MG EC tablet Take 81 mg by mouth every morning.   . budesonide-formoterol (SYMBICORT) 160-4.5 MCG/ACT inhaler Inhale 2 puffs into the lungs 2 (two) times daily.  . Cholecalciferol (VITAMIN D3) 2000 UNITS TABS Take 1 tablet by mouth every morning.  . citalopram (CELEXA) 40 MG tablet TAKE 1 TABLET BY MOUTH EVERY DAY  . donepezil (ARICEPT) 10 MG tablet Take 10 mg by mouth at bedtime.  . furosemide (LASIX) 80 MG tablet Take 1 tablet (80 mg total) by mouth daily.  Marland Kitchen LORazepam (ATIVAN) 1 MG tablet TAKE ONE-HALF TO ONE TABLET BY MOUTH EVERY NIGHT AT BEDTIME AS NEEDED  . losartan (COZAAR) 100 MG tablet TAKE 1/2 TABLET BY MOUTH DAILY*THIS REPLACES BENICAR*  . metoprolol tartrate (LOPRESSOR) 25 MG tablet Take 0.5 tablets (12.5 mg total) by mouth 2 (two) times daily.  . nitroGLYCERIN (NITROSTAT) 0.4 MG SL tablet Place 1 tablet (0.4 mg total) under the tongue as  needed for chest pain.  . pravastatin (PRAVACHOL) 40 MG tablet TAKE 1 TABLET BY MOUTH EVERY DAY  . tiotropium (SPIRIVA HANDIHALER) 18 MCG inhalation capsule INHALE THE CONTENTS OF 1 CAPSULE VIA INHALER ONCE DAILY  . [DISCONTINUED] losartan (COZAAR) 100 MG tablet TAKE 1 TABLET BY MOUTH DAILY*THIS REPLACES BENICAR*  . [DISCONTINUED] albuterol (PROVENTIL HFA;VENTOLIN HFA) 108 (90 BASE) MCG/ACT inhaler Inhale 2 puffs into the lungs every 6 (six) hours as needed for wheezing.  . [DISCONTINUED] amLODipine (NORVASC) 5 MG tablet TAKE 1 TABLET BY MOUTH EVERY MORNING  . [DISCONTINUED] donepezil (ARICEPT) 10 MG tablet TAKE 1 TABLET BY MOUTH EVERY DAY  . [DISCONTINUED] erythromycin ophthalmic ointment Apply 1 application to eye daily as needed.   . [DISCONTINUED] ketoconazole (NIZORAL) 2 % cream Apply 1 application topically 2 (two) times daily. Apply BID for 7 days to corners of mouth    Allergies  Allergen Reactions  . Acetaminophen   . Codeine Nausea And Vomiting  . Dilaudid [Hydromorphone Hcl] Other (See Comments)    Extremely sensitive  . Erythromycin Other (See Comments)    Intense stomach pain  . Sulfonamide Derivatives     Past Medical History  Diagnosis Date  . CAD (coronary artery disease) 08/2009    s/p PCI of the LAD  . Myocardial infarction     Non-st segment elevated  . Hypertension   . Hyperlipidemia   . Bradycardia   . Carotid artery stenosis   . COPD (chronic obstructive pulmonary disease)   . Chronic headache   . Asthma   .  Pneumonia   . Ulnar neuropathy   . Seasonal allergies   . Impaired fasting glucose   . Dementia     patient denies this  . Osteoporosis 2009    ROS: Negative except as per HPI  BP 158/68  Pulse 52  Ht 5' 3.5" (1.613 m)  Wt 183 lb 12.8 oz (83.371 kg)  BMI 32.04 kg/m2  PHYSICAL EXAM: Pt is alert and oriented, NAD HEENT: normal Neck: JVP - normal, carotids 2+= without bruits Lungs: CTA bilaterally CV: Bradycardic and regular with with  grade 2/6 systolic ejection murmur at the left sternal border Abd: soft, NT, Positive BS, obese Ext: no C/C/E, distal pulses intact and equal Skin: warm/dry no rash  EKG:  Sinus bradycardia 52 beats per minute, ST and T wave abnormality consider anterolateral ischemia. Prolonged QT.  Cardiac Cath 10/2012: Procedural Findings:  Hemodynamics:  AO 124/50 with a mean of 76  LV 143/80  Coronary angiography:  Coronary dominance: right  Left mainstem: The left main is short. The vessel is widely patent and arises from the left cusp. It divides into the LAD and left circumflex.  Left anterior descending (LAD): The LAD has minor irregularity. The vessel is patent to the left ventricular apex. The stented segment mid LAD is widely patent without significant in-stent restenosis. There is no obstructive disease throughout the distribution of the LAD. There are 3 patent diagonal branches without obstructive disease.  Left circumflex (LCx): The left circumflex is a large, dominant vessel. There is diffuse luminal irregularity. There were 2 obtuse marginal branches, a left posterolateral branch, and the left PDA.  Right coronary artery (RCA): The RCA is nondominant. The vessel is moderately calcified. There is mild proximal 20-30% stenosis.  Left ventriculography: Left ventricular systolic function is vigorous, with an estimated LVEF of 65-70%. There is marked hypertrophy of the left ventricular apex with obliteration of the distal left ventricular cavity.  Final Conclusions:  1. Continued patency of the stented segment in the LAD  2. Widely patent, dominant left circumflex  3. 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 The patient does have a 20 mm pressure gradient without known aortic valve disease. Will check a cardiac MRI to rule out LV apical scarring and to further evaluate her cardiomyopathy.  Tonny Bollman  11/05/2012, 9:30 AM  Cardiac  MRI 12/28/2012: FINDINGS:  Limited views of the lung fields showed no gross abnormalities.  There was prominent epicardial adipose tissue.  The left ventricle was normal in size. There was an unusual pattern  of hypertrophy. The anteroseptal wall (23 mm) and the anterior wall  (27 mm) were severely hypertrophied from base to apex. There apex  and peri-apical segments were severely hypertrophied. The basal to  mid inferior/inferoseptal walls (13 mm) and the inferolateral wall  (7 mm) were not significantly hypertrophied. EF was 65% with normal  wall motion. The right ventricle was normal size and systolic  function. Mild left atrial enlargement, normal right atrial size. We  did not get a good view of the aortic valve, cannot rule out  bicuspid. The aortic valve was thickened with some turbulence. There  was no aortic insufficiency. There was probably mild mitral  regurgitation. There was no mitral valve systolic anterior motion.  The LV outflow tract was narrowed by the hypertrophied anteroseptal  wall.  On delayed enhancement imaging, there was prominent subepicardial  enhancement in the basal anterior wall. There was a small area of  apical inferior enhancement.  Measurements:  LV EDV 84 mL  LV SV 55 mL  LV EF 65%  IMPRESSION:  1. Unusual hypertrophy pattern involving the anteroseptal wall, the  anterior wall, and the apex and peri-apical segments. Normal LV EF.  There was also a non-coronary disease pattern of delayed enhancement  in the subepicardial basal anterior wall and the apical inferior  wall. There was a narrowed LV outflow tract without mitral valve  systolic anterior motion. These findings suggest a variant of  hypertrophic cardiomyopathy.  2. Normal RV size and systolic function.  Dalton Mclean    ASSESSMENT AND PLAN: 1. Hypertrophic cardiomyopathy. Continue beta blockade, diuretic as needed. Reviewed lifestyle modification again with the patient. Recommend repeat  echocardiogram prior to her next office visit in 6 months.  2. Coronary artery disease, native vessel. She continues to have chest pain. Her cardiac catheterization last year, which was performed for the same symptom, showed a widely patent LAD stent and no evidence of obstructive coronary artery disease. Continue medical management.  3. Hypertension. The patient did not take her antihypertensive medications this morning. She will continue on her same regimen.  Tonny Bollman 11/06/2013 9:22 AM

## 2013-11-06 NOTE — Patient Instructions (Signed)
Your physician recommends that you continue on your current medications as directed. Please refer to the Current Medication list given to you today.  Your physician has requested that you have an echocardiogram. Echocardiography is a painless test that uses sound waves to create images of your heart. It provides your doctor with information about the size and shape of your heart and how well your heart's chambers and valves are working. This procedure takes approximately one hour. There are no restrictions for this procedure. (To be done a couple of day prior to office visit)  Your physician wants you to follow-up in: 6 months You will receive a reminder letter in the mail two months in advance. If you don't receive a letter, please call our office to schedule the follow-up appointment.

## 2013-11-09 ENCOUNTER — Encounter: Payer: Self-pay | Admitting: Cardiovascular Disease

## 2013-11-09 DIAGNOSIS — I421 Obstructive hypertrophic cardiomyopathy: Secondary | ICD-10-CM | POA: Insufficient documentation

## 2013-11-24 ENCOUNTER — Other Ambulatory Visit: Payer: Self-pay | Admitting: Family Medicine

## 2013-12-03 ENCOUNTER — Encounter: Payer: Self-pay | Admitting: Family Medicine

## 2013-12-03 ENCOUNTER — Ambulatory Visit (INDEPENDENT_AMBULATORY_CARE_PROVIDER_SITE_OTHER): Payer: Medicare Other | Admitting: Family Medicine

## 2013-12-03 VITALS — BP 146/82 | Ht 63.5 in | Wt 186.0 lb

## 2013-12-03 DIAGNOSIS — R7309 Other abnormal glucose: Secondary | ICD-10-CM | POA: Diagnosis not present

## 2013-12-03 DIAGNOSIS — Z23 Encounter for immunization: Secondary | ICD-10-CM

## 2013-12-03 DIAGNOSIS — I10 Essential (primary) hypertension: Secondary | ICD-10-CM | POA: Diagnosis not present

## 2013-12-03 DIAGNOSIS — I2510 Atherosclerotic heart disease of native coronary artery without angina pectoris: Secondary | ICD-10-CM

## 2013-12-03 DIAGNOSIS — R7303 Prediabetes: Secondary | ICD-10-CM | POA: Insufficient documentation

## 2013-12-03 DIAGNOSIS — M545 Low back pain, unspecified: Secondary | ICD-10-CM

## 2013-12-03 DIAGNOSIS — R5383 Other fatigue: Secondary | ICD-10-CM

## 2013-12-03 DIAGNOSIS — E785 Hyperlipidemia, unspecified: Secondary | ICD-10-CM | POA: Diagnosis not present

## 2013-12-03 LAB — POCT GLYCOSYLATED HEMOGLOBIN (HGB A1C): HEMOGLOBIN A1C: 5.8

## 2013-12-03 NOTE — Progress Notes (Signed)
   Subjective:    Patient ID: [name], female    DOB: January 25, 1936, 78 y.o.   MRN: [id number]  HPI Comments: Still having trouble sleeping at night  Back Pain This is a chronic problem. The current episode started more than 1 year ago. The problem occurs constantly. The problem has been gradually worsening since onset. The pain is present in the sacro-iliac. The pain radiates to the left thigh. The pain is worse during the day. Exacerbated by: activity. Stiffness is present all day. Associated symptoms include numbness and weakness. She has tried heat for the symptoms. The treatment provided mild relief.    Sinus issues on and off for a few weeks  Patient relates she is trying to eat healthy and trying to stay physically active denies being depressed  Flu vaccine  Review of Systems  Musculoskeletal: Positive for back pain.  Neurological: Positive for weakness and numbness.       Objective:   Physical Exam  Lower lumbar tenderness negative straight leg raise abdomen soft lungs clear heart regular mouth sinus symptoms although not severe      Assessment & Plan:  Viral sinus symptoms if worse call us for antibiotics if worse followup  Low back pain x-rays order  Diabetes good control history hyperlipidemia need to check lipid profile

## 2013-12-03 NOTE — Patient Instructions (Signed)
To do list:  Labs- please do these fasting within the next 10 days  Xrays- to do xray of your lumbar spine within the next 10 days

## 2013-12-06 ENCOUNTER — Ambulatory Visit (HOSPITAL_COMMUNITY)
Admission: RE | Admit: 2013-12-06 | Discharge: 2013-12-06 | Disposition: A | Discharge status: Home / Self Care | Payer: Medicare Other | Source: Ambulatory Visit | Attending: Family Medicine | Admitting: Family Medicine

## 2013-12-06 DIAGNOSIS — E785 Hyperlipidemia, unspecified: Secondary | ICD-10-CM | POA: Diagnosis not present

## 2013-12-06 DIAGNOSIS — R5383 Other fatigue: Secondary | ICD-10-CM | POA: Diagnosis not present

## 2013-12-06 DIAGNOSIS — M545 Low back pain, unspecified: Secondary | ICD-10-CM

## 2013-12-06 DIAGNOSIS — M5136 Other intervertebral disc degeneration, lumbar region: Secondary | ICD-10-CM | POA: Diagnosis not present

## 2013-12-06 DIAGNOSIS — I10 Essential (primary) hypertension: Secondary | ICD-10-CM | POA: Diagnosis not present

## 2013-12-07 LAB — LIPID PANEL
CHOLESTEROL: 133 mg/dL (ref 0–200)
HDL: 33 mg/dL — ABNORMAL LOW (ref >39)
LDL Cholesterol: 69 mg/dL (ref 0–99)
Total CHOL/HDL Ratio: 4 Ratio
Triglycerides: 153 mg/dL — ABNORMAL HIGH (ref <150)
VLDL: 31 mg/dL (ref 0–40)

## 2013-12-07 LAB — CBC WITH DIFFERENTIAL/PLATELET
Basophils Absolute: 0.1 10*3/uL (ref 0.0–0.1)
Basophils Relative: 1 % (ref 0–1)
Eosinophils Absolute: 0.2 10*3/uL (ref 0.0–0.7)
Eosinophils Relative: 3 % (ref 0–5)
HEMATOCRIT: 34.8 % — AB (ref 36.0–46.0)
HEMOGLOBIN: 11.1 g/dL — AB (ref 12.0–15.0)
LYMPHS PCT: 18 % (ref 12–46)
Lymphs Abs: 1.1 10*3/uL (ref 0.7–4.0)
MCH: 27.2 pg (ref 26.0–34.0)
MCHC: 31.9 g/dL (ref 30.0–36.0)
MCV: 85.3 fL (ref 78.0–100.0)
MONO ABS: 0.4 10*3/uL (ref 0.1–1.0)
Monocytes Relative: 6 % (ref 3–12)
NEUTROS ABS: 4.3 10*3/uL (ref 1.7–7.7)
Neutrophils Relative %: 72 % (ref 43–77)
Platelets: 183 10*3/uL (ref 150–400)
RBC: 4.08 MIL/uL (ref 3.87–5.11)
RDW: 14.7 % (ref 11.5–15.5)
WBC: 6 10*3/uL (ref 4.0–10.5)

## 2013-12-07 LAB — BASIC METABOLIC PANEL
BUN: 21 mg/dL (ref 6–23)
CO2: 29 mEq/L (ref 19–32)
Calcium: 8.4 mg/dL (ref 8.4–10.5)
Chloride: 102 mEq/L (ref 96–112)
Creat: 1.57 mg/dL — ABNORMAL HIGH (ref 0.50–1.10)
GLUCOSE: 108 mg/dL — AB (ref 70–99)
POTASSIUM: 4.3 meq/L (ref 3.5–5.3)
Sodium: 140 mEq/L (ref 135–145)

## 2013-12-07 LAB — TSH: TSH: 3.086 u[IU]/mL (ref 0.350–4.500)

## 2013-12-09 ENCOUNTER — Encounter: Payer: Self-pay | Admitting: Family Medicine

## 2013-12-09 ENCOUNTER — Ambulatory Visit (INDEPENDENT_AMBULATORY_CARE_PROVIDER_SITE_OTHER): Payer: Medicare Other | Admitting: Family Medicine

## 2013-12-09 VITALS — BP 130/86 | Ht 63.5 in | Wt 186.0 lb

## 2013-12-09 DIAGNOSIS — N3000 Acute cystitis without hematuria: Secondary | ICD-10-CM

## 2013-12-09 DIAGNOSIS — M545 Low back pain: Secondary | ICD-10-CM

## 2013-12-09 DIAGNOSIS — I2510 Atherosclerotic heart disease of native coronary artery without angina pectoris: Secondary | ICD-10-CM

## 2013-12-09 MED ORDER — CEFPROZIL 500 MG PO TABS: 500.0000 mg | ORAL_TABLET | Freq: Two times a day (BID) | ORAL | Status: DC

## 2013-12-09 NOTE — Patient Instructions (Signed)
Use the antibiotic one 2 times a day for 1 week, call and follow up if fever or worse    Urinary Tract Infection Urinary tract infections (UTIs) can develop anywhere along your urinary tract. Your urinary tract is your body's drainage system for removing wastes and extra water. Your urinary tract includes two kidneys, two ureters, a bladder, and a urethra. Your kidneys are a pair of bean-shaped organs. Each kidney is about the size of your fist. They are located below your ribs, one on each side of your spine. CAUSES Infections are caused by microbes, which are microscopic organisms, including fungi, viruses, and bacteria. These organisms are so small that they can only be seen through a microscope. Bacteria are the microbes that most commonly cause UTIs. SYMPTOMS  Symptoms of UTIs may vary by age and gender of the patient and by the location of the infection. Symptoms in young women typically include a frequent and intense urge to urinate and a painful, burning feeling in the bladder or urethra during urination. Older women and men are more likely to be tired, shaky, and weak and have muscle aches and abdominal pain. A fever may mean the infection is in your kidneys. Other symptoms of a kidney infection include pain in your back or sides below the ribs, nausea, and vomiting. DIAGNOSIS To diagnose a UTI, your caregiver will ask you about your symptoms. Your caregiver also will ask to provide a urine sample. The urine sample will be tested for bacteria and white blood cells. White blood cells are made by your body to help fight infection. TREATMENT  Typically, UTIs can be treated with medication. Because most UTIs are caused by a bacterial infection, they usually can be treated with the use of antibiotics. The choice of antibiotic and length of treatment depend on your symptoms and the type of bacteria causing your infection. HOME CARE INSTRUCTIONS  If you were prescribed antibiotics, take them  exactly as your caregiver instructs you. Finish the medication even if you feel better after you have only taken some of the medication.  Drink enough water and fluids to keep your urine clear or pale yellow.  Avoid caffeine, tea, and carbonated beverages. They tend to irritate your bladder.  Empty your bladder often. Avoid holding urine for long periods of time.  Empty your bladder before and after sexual intercourse.  After a bowel movement, women should cleanse from front to back. Use each tissue only once. SEEK MEDICAL CARE IF:   You have back pain.  You develop a fever.  Your symptoms do not begin to resolve within 3 days. SEEK IMMEDIATE MEDICAL CARE IF:   You have severe back pain or lower abdominal pain.  You develop chills.  You have nausea or vomiting.  You have continued burning or discomfort with urination. MAKE SURE YOU:   Understand these instructions.  Will watch your condition.  Will get help right away if you are not doing well or get worse. Document Released: 11/10/2004 Document Revised: 08/02/2011 Document Reviewed: 03/11/2011 [facility] Patient Information 2015 [place], [place]. This information is not intended to replace advice given to you by your health care provider. Make sure you discuss any questions you have with your health care provider.

## 2013-12-09 NOTE — Progress Notes (Signed)
   Subjective:    Patient ID: [name], female    DOB: 04-22-1935, 78 y.o.   MRN: [id number]  Dysuria  This is a new problem.   startede Thursday then worse Friday night Had incontinence  Sat/Sun had dysuria No fever known but felt hot and cold No N or V  Her lab work from last week was reviewed in detail with her and her daughter Review of Systems  Genitourinary: Positive for dysuria.   relates low back pain denies fever chills     Objective:   Physical Exam  Lungs clear heart regular low back subjective tenderness abdomen soft UA with WBCs      Assessment & Plan:  UTI antibiotics prescribed warning signs discussed follow-up if problems Proceed forward with antibiotics. Should gradually get better Lumbar back pain this is subjective she has significant arthritic changes on the x-ray no need for other intervention currently

## 2013-12-16 ENCOUNTER — Telehealth: Payer: Self-pay | Admitting: Family Medicine

## 2013-12-16 MED ORDER — CIPROFLOXACIN HCL 250 MG PO TABS: 250.0000 mg | ORAL_TABLET | Freq: Two times a day (BID) | ORAL | Status: DC

## 2013-12-16 NOTE — Telephone Encounter (Signed)
Patient was given cefzil last week for acute cystitis and low back pain without sciatica.  She is still having bladder spasms and cant make it to the bathroom.  She also has a slight odor.  Please advise.  [facility]

## 2013-12-16 NOTE — Telephone Encounter (Signed)
Patient was seen for   Bladder spasms and cant make it to the bathroom, slight odor  [facility]

## 2013-12-16 NOTE — Telephone Encounter (Signed)
PER DR. SCOTT- Cipro 250 mg BID x 5 days, AZO if needed, follow up if ongoing. No urine culture because patient is currently taking antibiotics. [name] was notified and verbalized understanding. Med sent to pharmacy.

## 2013-12-20 ENCOUNTER — Encounter: Payer: Self-pay | Admitting: Family Medicine

## 2013-12-20 ENCOUNTER — Ambulatory Visit (INDEPENDENT_AMBULATORY_CARE_PROVIDER_SITE_OTHER): Payer: Medicare Other | Admitting: Family Medicine

## 2013-12-20 ENCOUNTER — Other Ambulatory Visit: Payer: Self-pay | Admitting: Family Medicine

## 2013-12-20 VITALS — BP 158/80 | Temp 98.4°F | Ht 63.5 in | Wt 182.0 lb

## 2013-12-20 DIAGNOSIS — R300 Dysuria: Secondary | ICD-10-CM

## 2013-12-20 DIAGNOSIS — I2510 Atherosclerotic heart disease of native coronary artery without angina pectoris: Secondary | ICD-10-CM | POA: Diagnosis not present

## 2013-12-20 LAB — POCT URINALYSIS DIPSTICK
Protein, UA: POSITIVE
Spec Grav, UA: 1.025
pH, UA: 5

## 2013-12-20 MED ORDER — NITROFURANTOIN MONOHYD MACRO 100 MG PO CAPS: 100.0000 mg | ORAL_CAPSULE | Freq: Two times a day (BID) | ORAL | Status: DC

## 2013-12-20 NOTE — Progress Notes (Signed)
   Subjective:    Patient ID: [name], female    DOB: [birth year], 78 y.o.   MRN: [id number]  Dysuria  This is a new problem. The current episode started in the past 7 days (Saw Dr. Lorin Picket on 10/26). The problem occurs intermittently. The problem has been gradually improving. The quality of the pain is described as burning. Associated symptoms include chills, a discharge, flank pain, frequency, hematuria, nausea and urgency. She has tried antibiotics for the symptoms. The treatment provided mild relief.   Please see prior notes. Advised return for evaluation.  On further history he did have one episode of bright red blood per stool yesterday. Has never had a colonoscopy. States she definitely does not want one   Review of Systems  Constitutional: Positive for chills.  Gastrointestinal: Positive for nausea.  Genitourinary: Positive for dysuria, urgency, frequency, hematuria and flank pain.       Objective:   Physical Exam Alert talkative no apparent distress vital stable lungs clear heart regular in rhythm no CVA tenderness diffuse minimal low abdominal sensitivity to palpation no rebound no guarding   4-6 white blood cells per high-power field occasional epithelial cell    Assessment & Plan:  Impression 1 protracted UTI #2 blood per stool 1, stools not hard per patient. planswitch to [employer]. Culture urine. Hemoccult cards 3. [facility]

## 2013-12-22 LAB — URINE CULTURE

## 2013-12-22 NOTE — Telephone Encounter (Signed)
Ok this and 4 refills 

## 2013-12-23 NOTE — Progress Notes (Signed)
Granddaughter notified and verbalized understanding. I transferred her up front to schedule an appt.

## 2013-12-23 NOTE — Telephone Encounter (Signed)
Ok this and 4 refills 

## 2013-12-30 ENCOUNTER — Other Ambulatory Visit: Payer: Self-pay | Admitting: *Deleted

## 2013-12-30 DIAGNOSIS — R300 Dysuria: Secondary | ICD-10-CM

## 2013-12-30 LAB — POC HEMOCCULT BLD/STL (HOME/3-CARD/SCREEN)
Card #2 Fecal Occult Blod, POC: NEGATIVE
Card #3 Fecal Occult Blood, POC: NEGATIVE
Fecal Occult Blood, POC: NEGATIVE

## 2014-01-02 ENCOUNTER — Ambulatory Visit: Payer: Medicare Other | Admitting: Nurse Practitioner

## 2014-01-02 ENCOUNTER — Ambulatory Visit: Payer: Medicare Other | Admitting: Family Medicine

## 2014-01-07 ENCOUNTER — Other Ambulatory Visit: Payer: Self-pay | Admitting: Family Medicine

## 2014-01-08 ENCOUNTER — Other Ambulatory Visit: Payer: Self-pay | Admitting: Family Medicine

## 2014-01-13 ENCOUNTER — Other Ambulatory Visit: Payer: Self-pay | Admitting: Family Medicine

## 2014-01-23 ENCOUNTER — Encounter (HOSPITAL_COMMUNITY): Payer: Self-pay | Admitting: Cardiovascular Disease

## 2014-02-04 ENCOUNTER — Telehealth: Payer: Self-pay | Admitting: Family Medicine

## 2014-02-04 NOTE — Telephone Encounter (Signed)
This patient is requesting her oxygen the with a new supplier. I do not believe that we have any oxygen testing from 08/01/2013 to the present. Therefore #1 verify with the patient this is what she once to do #2 please set the patient up for O2 testing at the hospital through pulmonary to meet what is being asked on her request paper. Please see the request paper. Thank you

## 2014-02-05 NOTE — Telephone Encounter (Signed)
[facility] with [facility] resp dept.

## 2014-02-05 NOTE — Telephone Encounter (Signed)
Discussed with granddaughter stacey. She states she will call back to schedule office visit. Pt to wear oxygen to office visit and take off 20 mins before the test.

## 2014-02-19 ENCOUNTER — Encounter: Payer: Self-pay | Admitting: Family Medicine

## 2014-02-19 ENCOUNTER — Ambulatory Visit (INDEPENDENT_AMBULATORY_CARE_PROVIDER_SITE_OTHER): Payer: Medicare Other | Admitting: Family Medicine

## 2014-02-19 ENCOUNTER — Encounter (HOSPITAL_COMMUNITY): Payer: Medicare Other

## 2014-02-19 VITALS — BP 122/60 | Ht 63.5 in | Wt 183.5 lb

## 2014-02-19 DIAGNOSIS — J449 Chronic obstructive pulmonary disease, unspecified: Secondary | ICD-10-CM

## 2014-02-19 NOTE — Progress Notes (Signed)
   Subjective:    Patient ID: [name], female    DOB: 12-28-1935, 79 y.o.   MRN: [id number]  HPI Patient is here today to become recertified for her oxygen therapy. Paperwork from the company is on front of patient's chart. Patient states that she has no other concerns at this time.  O2 sats ration 92% with 2 L 84% 02 resting on room air 78% 02 exertion on room air  88% O2 level with exertion with 2 L of O2 Review of Systems Patient relates shortness of breath coughing wheezing she has COPD.    Objective:   Physical Exam Lungs are clear with faint wheezes heart regular pulse normal extremities no edema       Assessment & Plan:  This patient has severe COPD Has hypoxia The hypoxia does get worse when she exerts herself. The hypoxia is significantly corrected with oxygen when she is walking with 2 L of O2 Order for oxygen filled out Follow up 3 months

## 2014-02-26 ENCOUNTER — Telehealth: Payer: Self-pay | Admitting: Family Medicine

## 2014-02-26 NOTE — Telephone Encounter (Signed)
Additional information regarding this patient's oxygen request through her new company was filled out. Medical records representative of our office since sent it forward.

## 2014-02-28 ENCOUNTER — Other Ambulatory Visit: Payer: Self-pay | Admitting: Cardiovascular Disease

## 2014-03-22 ENCOUNTER — Telehealth: Payer: Self-pay | Admitting: Family Medicine

## 2014-03-22 NOTE — Telephone Encounter (Signed)
Pt's albuterol (PROVENTIL HFA) 108 (90 BASE) MCG/ACT inhaler is Non-Formulary, plan prefers albuterol (VENTOLIN HFA) 108 (90 BASE) MCG/ACT inhaler Please see letter in red folder, change or try to get exception, please advise

## 2014-03-23 NOTE — Telephone Encounter (Signed)
Please send in the change for albuterol, Ventolin HFA. Nurse's-please note the electronic system often defaults to plain albuterol or Proventil. In this particular patient's situation Ventolin is the only thing covered please make sure that this is conveyed to her pharmacy.

## 2014-03-24 MED ORDER — ALBUTEROL SULFATE HFA 108 (90 BASE) MCG/ACT IN AERS: 2.0000 | INHALATION_SPRAY | Freq: Four times a day (QID) | RESPIRATORY_TRACT | Status: DC | PRN

## 2014-03-24 NOTE — Telephone Encounter (Signed)
Rx for Ventolin inhaler sent electronically to pharmacy.

## 2014-03-25 ENCOUNTER — Other Ambulatory Visit: Payer: Self-pay | Admitting: *Deleted

## 2014-03-25 MED ORDER — DONEPEZIL HCL 10 MG PO TABS: 10.0000 mg | ORAL_TABLET | Freq: Every day | ORAL | Status: DC

## 2014-04-10 ENCOUNTER — Inpatient Hospital Stay (HOSPITAL_COMMUNITY)
Admission: EM | Admit: 2014-04-10 | Discharge: 2014-04-16 | DRG: 291 | Disposition: A | Discharge status: Home Health | Payer: Medicare Other | Attending: Internal Medicine | Admitting: Internal Medicine

## 2014-04-10 ENCOUNTER — Encounter (HOSPITAL_COMMUNITY): Payer: Self-pay

## 2014-04-10 ENCOUNTER — Emergency Department (HOSPITAL_COMMUNITY): Payer: Medicare Other

## 2014-04-10 DIAGNOSIS — I5031 Acute diastolic (congestive) heart failure: Secondary | ICD-10-CM | POA: Diagnosis present

## 2014-04-10 DIAGNOSIS — Z9114 Patient's other noncompliance with medication regimen: Secondary | ICD-10-CM | POA: Diagnosis present

## 2014-04-10 DIAGNOSIS — I10 Essential (primary) hypertension: Secondary | ICD-10-CM

## 2014-04-10 DIAGNOSIS — J45909 Unspecified asthma, uncomplicated: Secondary | ICD-10-CM | POA: Diagnosis present

## 2014-04-10 DIAGNOSIS — Z811 Family history of alcohol abuse and dependence: Secondary | ICD-10-CM

## 2014-04-10 DIAGNOSIS — Z955 Presence of coronary angioplasty implant and graft: Secondary | ICD-10-CM

## 2014-04-10 DIAGNOSIS — I2510 Atherosclerotic heart disease of native coronary artery without angina pectoris: Secondary | ICD-10-CM | POA: Diagnosis present

## 2014-04-10 DIAGNOSIS — J441 Chronic obstructive pulmonary disease with (acute) exacerbation: Secondary | ICD-10-CM | POA: Diagnosis present

## 2014-04-10 DIAGNOSIS — J9611 Chronic respiratory failure with hypoxia: Secondary | ICD-10-CM | POA: Diagnosis present

## 2014-04-10 DIAGNOSIS — Z87891 Personal history of nicotine dependence: Secondary | ICD-10-CM | POA: Diagnosis not present

## 2014-04-10 DIAGNOSIS — J90 Pleural effusion, not elsewhere classified: Secondary | ICD-10-CM | POA: Diagnosis not present

## 2014-04-10 DIAGNOSIS — I421 Obstructive hypertrophic cardiomyopathy: Secondary | ICD-10-CM | POA: Diagnosis present

## 2014-04-10 DIAGNOSIS — R0602 Shortness of breath: Secondary | ICD-10-CM | POA: Diagnosis not present

## 2014-04-10 DIAGNOSIS — J449 Chronic obstructive pulmonary disease, unspecified: Secondary | ICD-10-CM

## 2014-04-10 DIAGNOSIS — Z7982 Long term (current) use of aspirin: Secondary | ICD-10-CM

## 2014-04-10 DIAGNOSIS — N183 Chronic kidney disease, stage 3 unspecified: Secondary | ICD-10-CM | POA: Diagnosis present

## 2014-04-10 DIAGNOSIS — Z9111 Patient's noncompliance with dietary regimen: Secondary | ICD-10-CM | POA: Diagnosis present

## 2014-04-10 DIAGNOSIS — J9621 Acute and chronic respiratory failure with hypoxia: Secondary | ICD-10-CM | POA: Diagnosis present

## 2014-04-10 DIAGNOSIS — E785 Hyperlipidemia, unspecified: Secondary | ICD-10-CM | POA: Diagnosis present

## 2014-04-10 DIAGNOSIS — F0390 Unspecified dementia without behavioral disturbance: Secondary | ICD-10-CM | POA: Diagnosis present

## 2014-04-10 DIAGNOSIS — I252 Old myocardial infarction: Secondary | ICD-10-CM

## 2014-04-10 DIAGNOSIS — I5033 Acute on chronic diastolic (congestive) heart failure: Secondary | ICD-10-CM | POA: Diagnosis not present

## 2014-04-10 DIAGNOSIS — Z7951 Long term (current) use of inhaled steroids: Secondary | ICD-10-CM | POA: Diagnosis not present

## 2014-04-10 DIAGNOSIS — Z791 Long term (current) use of non-steroidal anti-inflammatories (NSAID): Secondary | ICD-10-CM

## 2014-04-10 DIAGNOSIS — J984 Other disorders of lung: Secondary | ICD-10-CM | POA: Diagnosis not present

## 2014-04-10 DIAGNOSIS — Z9981 Dependence on supplemental oxygen: Secondary | ICD-10-CM | POA: Diagnosis not present

## 2014-04-10 DIAGNOSIS — M810 Age-related osteoporosis without current pathological fracture: Secondary | ICD-10-CM | POA: Diagnosis present

## 2014-04-10 DIAGNOSIS — I129 Hypertensive chronic kidney disease with stage 1 through stage 4 chronic kidney disease, or unspecified chronic kidney disease: Secondary | ICD-10-CM | POA: Diagnosis present

## 2014-04-10 DIAGNOSIS — I509 Heart failure, unspecified: Secondary | ICD-10-CM

## 2014-04-10 DIAGNOSIS — R0902 Hypoxemia: Secondary | ICD-10-CM

## 2014-04-10 HISTORY — DX: Acute on chronic diastolic (congestive) heart failure: I50.33

## 2014-04-10 LAB — CBC WITH DIFFERENTIAL/PLATELET
Basophils Absolute: 0 10*3/uL (ref 0.0–0.1)
Basophils Relative: 0 % (ref 0–1)
Eosinophils Absolute: 0 10*3/uL (ref 0.0–0.7)
Eosinophils Relative: 0 % (ref 0–5)
HEMATOCRIT: 34.2 % — AB (ref 36.0–46.0)
Hemoglobin: 10.8 g/dL — ABNORMAL LOW (ref 12.0–15.0)
LYMPHS PCT: 9 % — AB (ref 12–46)
Lymphs Abs: 0.7 10*3/uL (ref 0.7–4.0)
MCH: 27.6 pg (ref 26.0–34.0)
MCHC: 31.6 g/dL (ref 30.0–36.0)
MCV: 87.5 fL (ref 78.0–100.0)
Monocytes Absolute: 0.4 10*3/uL (ref 0.1–1.0)
Monocytes Relative: 5 % (ref 3–12)
NEUTROS ABS: 7.2 10*3/uL (ref 1.7–7.7)
Neutrophils Relative %: 86 % — ABNORMAL HIGH (ref 43–77)
PLATELETS: 197 10*3/uL (ref 150–400)
RBC: 3.91 MIL/uL (ref 3.87–5.11)
RDW: 14.5 % (ref 11.5–15.5)
WBC: 8.3 10*3/uL (ref 4.0–10.5)

## 2014-04-10 LAB — BASIC METABOLIC PANEL
Anion gap: 2 — ABNORMAL LOW (ref 5–15)
BUN: 21 mg/dL (ref 6–23)
CO2: 25 mmol/L (ref 19–32)
CREATININE: 1.39 mg/dL — AB (ref 0.50–1.10)
Calcium: 8 mg/dL — ABNORMAL LOW (ref 8.4–10.5)
Chloride: 109 mmol/L (ref 96–112)
GFR calc non Af Amer: 36 mL/min — ABNORMAL LOW (ref >90)
GFR, EST AFRICAN AMERICAN: 42 mL/min — AB (ref >90)
Glucose, Bld: 180 mg/dL — ABNORMAL HIGH (ref 70–99)
Potassium: 4.3 mmol/L (ref 3.5–5.1)
Sodium: 136 mmol/L (ref 135–145)

## 2014-04-10 LAB — URINALYSIS, ROUTINE W REFLEX MICROSCOPIC
Bilirubin Urine: NEGATIVE
Glucose, UA: NEGATIVE mg/dL
Ketones, ur: NEGATIVE mg/dL
LEUKOCYTES UA: NEGATIVE
Nitrite: NEGATIVE
PH: 5.5 (ref 5.0–8.0)
Protein, ur: 30 mg/dL — AB
Specific Gravity, Urine: >1.03 — ABNORMAL HIGH (ref 1.005–1.030)
Urobilinogen, UA: 0.2 mg/dL (ref 0.0–1.0)

## 2014-04-10 LAB — TROPONIN I: Troponin I: <0.03 ng/mL (ref <0.031)

## 2014-04-10 LAB — URINE MICROSCOPIC-ADD ON

## 2014-04-10 LAB — MRSA PCR SCREENING: MRSA BY PCR: NEGATIVE

## 2014-04-10 LAB — BRAIN NATRIURETIC PEPTIDE: B Natriuretic Peptide: 989 pg/mL — ABNORMAL HIGH (ref 0.0–100.0)

## 2014-04-10 MED ORDER — SODIUM CHLORIDE 0.9 % IJ SOLN
3.0000 mL | Freq: Two times a day (BID) | INTRAMUSCULAR | Status: DC
  Administered 2014-04-11 – 2014-04-16 (×12): 3 mL via INTRAVENOUS

## 2014-04-10 MED ORDER — METHYLPREDNISOLONE SODIUM SUCC 125 MG IJ SOLR
125.0000 mg | Freq: Once | INTRAMUSCULAR | Status: AC
  Administered 2014-04-10: 125 mg via INTRAVENOUS
  Filled 2014-04-10: qty 2

## 2014-04-10 MED ORDER — LORAZEPAM 1 MG PO TABS: 1.0000 mg | ORAL_TABLET | Freq: Every evening | ORAL | Status: DC | PRN

## 2014-04-10 MED ORDER — METOPROLOL TARTRATE 25 MG PO TABS
12.5000 mg | ORAL_TABLET | Freq: Two times a day (BID) | ORAL | Status: DC
  Administered 2014-04-11 – 2014-04-13 (×6): 12.5 mg via ORAL
  Filled 2014-04-10 (×6): qty 1

## 2014-04-10 MED ORDER — BUDESONIDE-FORMOTEROL FUMARATE 160-4.5 MCG/ACT IN AERO
2.0000 | INHALATION_SPRAY | Freq: Two times a day (BID) | RESPIRATORY_TRACT | Status: DC
  Administered 2014-04-11 – 2014-04-16 (×11): 2 via RESPIRATORY_TRACT
  Filled 2014-04-10: qty 6

## 2014-04-10 MED ORDER — NITROGLYCERIN IN D5W 200-5 MCG/ML-% IV SOLN
3.0000 ug/min | INTRAVENOUS | Status: DC
  Administered 2014-04-10: 3 ug/min via INTRAVENOUS
  Filled 2014-04-10: qty 250

## 2014-04-10 MED ORDER — FUROSEMIDE 10 MG/ML IJ SOLN
40.0000 mg | Freq: Once | INTRAMUSCULAR | Status: AC
  Administered 2014-04-10: 40 mg via INTRAVENOUS
  Filled 2014-04-10: qty 4

## 2014-04-10 MED ORDER — ENOXAPARIN SODIUM 40 MG/0.4ML ~~LOC~~ SOLN
40.0000 mg | SUBCUTANEOUS | Status: DC
  Administered 2014-04-10 – 2014-04-12 (×3): 40 mg via SUBCUTANEOUS
  Filled 2014-04-10 (×3): qty 0.4

## 2014-04-10 MED ORDER — ERYTHROMYCIN 5 MG/GM OP OINT
TOPICAL_OINTMENT | Freq: Every day | OPHTHALMIC | Status: DC
  Administered 2014-04-11 – 2014-04-13 (×3): 1 via OPHTHALMIC
  Administered 2014-04-14 – 2014-04-15 (×2): via OPHTHALMIC
  Filled 2014-04-10: qty 3.5

## 2014-04-10 MED ORDER — ACETAMINOPHEN 325 MG PO TABS: 650.0000 mg | ORAL_TABLET | ORAL | Status: DC | PRN

## 2014-04-10 MED ORDER — FUROSEMIDE 10 MG/ML IJ SOLN
60.0000 mg | Freq: Two times a day (BID) | INTRAMUSCULAR | Status: DC
  Administered 2014-04-10 – 2014-04-13 (×6): 60 mg via INTRAVENOUS
  Filled 2014-04-10 (×6): qty 6

## 2014-04-10 MED ORDER — CITALOPRAM HYDROBROMIDE 20 MG PO TABS
40.0000 mg | ORAL_TABLET | Freq: Every morning | ORAL | Status: DC
  Administered 2014-04-11: 40 mg via ORAL
  Filled 2014-04-10: qty 2
  Filled 2014-04-10: qty 1

## 2014-04-10 MED ORDER — ASPIRIN EC 81 MG PO TBEC
81.0000 mg | DELAYED_RELEASE_TABLET | Freq: Every morning | ORAL | Status: DC
  Administered 2014-04-11 – 2014-04-16 (×6): 81 mg via ORAL
  Filled 2014-04-10 (×6): qty 1

## 2014-04-10 MED ORDER — NITROGLYCERIN 0.4 MG SL SUBL: 0.4000 mg | SUBLINGUAL_TABLET | SUBLINGUAL | Status: DC | PRN

## 2014-04-10 MED ORDER — NITROGLYCERIN IN D5W 200-5 MCG/ML-% IV SOLN
0.0000 ug/min | INTRAVENOUS | Status: DC
  Administered 2014-04-11: 3 ug/min via INTRAVENOUS

## 2014-04-10 MED ORDER — VITAMIN D 1000 UNITS PO TABS
2000.0000 [IU] | ORAL_TABLET | Freq: Every morning | ORAL | Status: DC
  Administered 2014-04-11 – 2014-04-16 (×6): 2000 [IU] via ORAL
  Filled 2014-04-10 (×6): qty 2

## 2014-04-10 MED ORDER — ALBUTEROL SULFATE (2.5 MG/3ML) 0.083% IN NEBU
5.0000 mg | INHALATION_SOLUTION | Freq: Once | RESPIRATORY_TRACT | Status: AC
  Administered 2014-04-10: 2.5 mg via RESPIRATORY_TRACT

## 2014-04-10 MED ORDER — ALBUTEROL (5 MG/ML) CONTINUOUS INHALATION SOLN
10.0000 mg/h | INHALATION_SOLUTION | Freq: Once | RESPIRATORY_TRACT | Status: AC
  Administered 2014-04-10: 10 mg/h via RESPIRATORY_TRACT
  Filled 2014-04-10: qty 20

## 2014-04-10 MED ORDER — LOSARTAN POTASSIUM 50 MG PO TABS
100.0000 mg | ORAL_TABLET | Freq: Every day | ORAL | Status: DC
  Administered 2014-04-11: 100 mg via ORAL
  Filled 2014-04-10: qty 2

## 2014-04-10 MED ORDER — AMLODIPINE BESYLATE 5 MG PO TABS
5.0000 mg | ORAL_TABLET | Freq: Every morning | ORAL | Status: DC
  Administered 2014-04-11 – 2014-04-16 (×6): 5 mg via ORAL
  Filled 2014-04-10 (×6): qty 1

## 2014-04-10 MED ORDER — PRAVASTATIN SODIUM 40 MG PO TABS
40.0000 mg | ORAL_TABLET | Freq: Every day | ORAL | Status: DC
  Administered 2014-04-10 – 2014-04-15 (×6): 40 mg via ORAL
  Filled 2014-04-10 (×5): qty 1

## 2014-04-10 MED ORDER — ONDANSETRON HCL 4 MG/2ML IJ SOLN: 4.0000 mg | Freq: Four times a day (QID) | INTRAMUSCULAR | Status: DC | PRN

## 2014-04-10 MED ORDER — DONEPEZIL HCL 5 MG PO TABS
10.0000 mg | ORAL_TABLET | Freq: Every day | ORAL | Status: DC
  Administered 2014-04-11 – 2014-04-15 (×6): 10 mg via ORAL
  Filled 2014-04-10 (×6): qty 2

## 2014-04-10 MED ORDER — ALBUTEROL SULFATE (2.5 MG/3ML) 0.083% IN NEBU
INHALATION_SOLUTION | RESPIRATORY_TRACT | Status: AC
  Administered 2014-04-10: 2.5 mg
  Filled 2014-04-10: qty 6

## 2014-04-10 NOTE — ED Notes (Signed)
This nurse called Lab to follow up on results and lab to call ED back about results of blood work.

## 2014-04-10 NOTE — ED Notes (Signed)
RT at bedside placing pt on bipap at this time.

## 2014-04-10 NOTE — ED Provider Notes (Signed)
CSN: [id number]     Arrival date & time 04/10/14  1416 History  This chart was scribed for Flint Melter, MD by Ronney Lion, ED Scribe. This patient was seen in room APA04/APA04 and the patient's care was started at 2:14 PM.    Chief Complaint  Patient presents with  . Shortness of Breath   The history is provided by the patient and a relative. No language interpreter was used.     HPI Comments: [name] is a 79 y.o. female who is on 2 L/min of home oxygen who presents to the Emergency Department complaining of chronic SOB that acutely worsened 2 day ago. Her daughter states that she has gotten progressively weaker since yesterday, and is unable to move about to perform daily activities. She usually uses Symbicort and Albuterol at home, which she has used 2-3 times today with minimal relief. Patient has a history of CAD and stents. She denies fever, cough, or chest pain.    Past Medical History  Diagnosis Date  . CAD (coronary artery disease) 08/2009    s/p PCI of the LAD  . Myocardial infarction     Non-st segment elevated  . Hypertension   . Hyperlipidemia   . Bradycardia   . Carotid artery stenosis   . COPD (chronic obstructive pulmonary disease)   . Chronic headache   . Asthma   . Pneumonia   . Ulnar neuropathy   . Seasonal allergies   . Impaired fasting glucose   . Dementia     patient denies this  . Osteoporosis 2009   Past Surgical History  Procedure Laterality Date  . Partial hysterectomy  1984  . Radial artery catheter      Bladder  . Bladder surgery  1991  . Cardiac catheterization  11/2010    negative  . Left heart catheterization with coronary angiogram N/A 11/05/2012    Procedure: LEFT HEART CATHETERIZATION WITH CORONARY ANGIOGRAM;  Surgeon: Micheline Chapman, MD;  Location: [facility] CATH LAB;  Service: Cardiovascular;  Laterality: N/A;   Family History  Problem Relation Age of Onset  . Addison's disease Mother   . Alcohol abuse Father    History   Substance Use Topics  . Smoking status: Former Smoker -- 1.00 packs/day for 35 years    Quit date: 02/15/2008  . Smokeless tobacco: Never Used  . Alcohol Use: No   OB History    No data available     Review of Systems  Constitutional: Negative for fever.  Respiratory: Positive for shortness of breath. Negative for cough.   Cardiovascular: Negative for chest pain.  Neurological: Positive for weakness.  All other systems reviewed and are negative.   Allergies  Acetaminophen; Codeine; Dilaudid; Erythromycin; and Sulfonamide derivatives  Home Medications   Prior to Admission medications   Medication Sig Start Date End Date Taking? Authorizing Provider  albuterol (PROVENTIL HFA;VENTOLIN HFA) 108 (90 BASE) MCG/ACT inhaler Inhale 2 puffs into the lungs every 6 (six) hours as needed for wheezing or shortness of breath. 03/24/14  Yes Babs Sciara, MD  alendronate (FOSAMAX) 70 MG tablet Take 70 mg by mouth every 7 (seven) days. Take with a full glass of water on an empty stomach.   Yes Historical Provider, MD  Alpha-D-Galactosidase Charlyne Quale) TABS Take 2 tablets by mouth 3 times/day as needed-between meals & bedtime.    Yes Historical Provider, MD  amLODipine (NORVASC) 5 MG tablet TAKE 1 TABLET BY MOUTH EVERY MORNING 01/07/14  Yes Scott  A Luking, MD  aspirin 81 MG EC tablet Take 81 mg by mouth every morning.    Yes Historical Provider, MD  budesonide-formoterol (SYMBICORT) 160-4.5 MCG/ACT inhaler Inhale 2 puffs into the lungs 2 (two) times daily. 08/28/13  Yes Babs Sciara, MD  Cholecalciferol (VITAMIN D3) 2000 UNITS TABS Take 1 tablet by mouth every morning.   Yes Historical Provider, MD  citalopram (CELEXA) 40 MG tablet TAKE 1 TABLET BY MOUTH EVERY MORNING 01/08/14  Yes Babs Sciara, MD  donepezil (ARICEPT) 10 MG tablet Take 1 tablet (10 mg total) by mouth at bedtime. 03/25/14  Yes Babs Sciara, MD  erythromycin ophthalmic ointment USE IN THE EYE AS DIRECTED 01/13/14  Yes Babs Sciara,  MD  furosemide (LASIX) 80 MG tablet Take 1 tablet (80 mg total) by mouth daily. 05/31/13  Yes Rosalio Macadamia, NP  ibuprofen (ADVIL,MOTRIN) 200 MG tablet Take 200 mg by mouth every 6 (six) hours as needed for headache, mild pain or moderate pain.   Yes Historical Provider, MD  LORazepam (ATIVAN) 1 MG tablet TAKE 1/2 TO 1 TABLET BY MOUTH EVERY NIGHT AT BEDTIME AS NEEDED Patient taking differently: TAKE 1 TABLET BY MOUTH EVERY NIGHT AT BEDTIME AS NEEDED 12/24/13  Yes Babs Sciara, MD  losartan (COZAAR) 100 MG tablet TAKE 1 TABLET BY MOUTH DAILY Patient taking differently: TAKE ONE-HALF TABLET BY MOUTH DAILY 11/25/13  Yes Babs Sciara, MD  metoprolol tartrate (LOPRESSOR) 25 MG tablet Take 0.5 tablets (12.5 mg total) by mouth 2 (two) times daily. 05/22/13  Yes Rosalio Macadamia, NP  nitroGLYCERIN (NITROSTAT) 0.4 MG SL tablet Place 1 tablet (0.4 mg total) under the tongue as needed for chest pain. 07/10/13  Yes Rosalio Macadamia, NP  pravastatin (PRAVACHOL) 40 MG tablet TAKE 1 TABLET BY MOUTH EVERY DAY 01/07/14  Yes Babs Sciara, MD  nitrofurantoin, macrocrystal-monohydrate, (MACROBID) 100 MG capsule Take 1 capsule (100 mg total) by mouth 2 (two) times daily. Patient not taking: Reported on 04/10/2014 12/20/13   Merlyn Albert, MD   BP 135/50 mmHg  Pulse 72  Temp(Src) 98 F (36.7 C) (Oral)  Resp 26  Ht 5' (1.524 m)  Wt 185 lb (83.915 kg)  BMI 36.13 kg/m2  SpO2 95% Physical Exam  Constitutional: She is oriented to person, place, and time. She appears well-developed and well-nourished.  HENT:  Head: Normocephalic and atraumatic.  Eyes: Conjunctivae and EOM are normal. Pupils are equal, round, and reactive to light.  Neck: Normal range of motion and phonation normal. Neck supple.  Cardiovascular: Normal rate and regular rhythm.   Pulmonary/Chest: Breath sounds normal. She is in respiratory distress (Moderate, accessory muscle use, and tachypnea). She exhibits no tenderness.  Poor air movement  bilaterally with end-expiratory wheezing.  Abdominal: Soft. She exhibits no distension. There is no tenderness. There is no guarding.  Musculoskeletal: Normal range of motion. She exhibits no edema or tenderness.  Neurological: She is alert and oriented to person, place, and time. She exhibits normal muscle tone.  Skin: Skin is warm and dry.  Psychiatric: She has a normal mood and affect. Her behavior is normal. Judgment and thought content normal.  Nursing note and vitals reviewed.   ED Course  Procedures (including critical care time)  DIAGNOSTIC STUDIES: Oxygen Saturation is 94% on 10 L/min non-rebreather mask, adequate by my interpretation.    COORDINATION OF CARE:  Medications  nitroGLYCERIN 50 mg in dextrose 5 % 250 mL (0.2 mg/mL) infusion (3 mcg/min  Intravenous New Bag/Given 04/10/14 1648)  albuterol (PROVENTIL) (2.5 MG/3ML) 0.083% nebulizer solution 5 mg (2.5 mg Nebulization Given 04/10/14 1449)  albuterol (PROVENTIL) (2.5 MG/3ML) 0.083% nebulizer solution (2.5 mg  Given 04/10/14 1448)  albuterol (PROVENTIL,VENTOLIN) solution continuous neb (10 mg/hr Nebulization Given 04/10/14 1448)  methylPREDNISolone sodium succinate (SOLU-MEDROL) 125 mg/2 mL injection 125 mg (125 mg Intravenous Given 04/10/14 1451)  furosemide (LASIX) injection 40 mg (40 mg Intravenous Given 04/10/14 1619)     Patient Vitals for the past 24 hrs:  BP Temp Temp src Pulse Resp SpO2 Height Weight  04/10/14 1730 (!) 135/50 mmHg - - 72 26 95 % - -  04/10/14 1715 (!) 144/52 mmHg - - 71 26 92 % - -  04/10/14 1700 (!) 150/51 mmHg - - 71 22 95 % - -  04/10/14 1631 (!) 150/46 mmHg - - 71 22 92 % - -  04/10/14 1627 - - - 77 23 - - -  04/10/14 1616 110/82 mmHg 98 F (36.7 C) Oral 77 (!) 30 92 % - -  04/10/14 1600 110/82 mmHg - - 75 (!) 27 (!) 88 % - -  04/10/14 1531 (!) 147/48 mmHg 98.2 F (36.8 C) Oral 62 24 98 % - -  04/10/14 1515 - - - 63 25 99 % - -  04/10/14 1449 - - - - - 98 % - -  04/10/14 1433 - 98.6 F (37  C) Oral - - - - -  04/10/14 1430 - - - 61 24 93 % - -  04/10/14 1429 - - - - - 94 % - -  04/10/14 1421 168/71 mmHg - - 62 (!) 30 (!) 60 % 5' (1.524 m) 185 lb (83.915 kg)     2:17 PM: Patient's O2 Sat was in the low 80s on 6 L/min O2 on [place], but has gone up to 95% on a non-rebreather face mask.    16:10- at this time after albuterol continuous nebulizer; she states that she feels somewhat better.  Oxygen saturation is still borderline, 90% on 7 L nasal cannula.  Re-auscultation indicates somewhat improved air movement, but still poor air movement bilaterally.  Chest x-ray results reviewed, indicating heart failure. Patient placed on BiPAP. Last cardiac echo about one year ago indicated  EF greater than 60%.  IV Lasix ordered.  Will place Foley for monitoring and obtain urinalysis and urine culture.  5:40 PM Reevaluation with update and discussion. After initial assessment and treatment, an updated evaluation reveals she is tolerating BiPAP, and feels somewhat better.  Oxygen saturation 93%, at this time.  She has produced 200 cc of urine, in the Foley bag, since the Lasix was administered.  Findings discussed with patient and family members, all questions answered. Erma Raiche L   5:43 PM-Consult complete with Hospitalist. Patient case explained and discussed. He agrees to admit patient for further evaluation and treatment. Call ended at 1750  CRITICAL CARE Performed by: Flint Melter Total critical care time: 45 minutes Critical care time was exclusive of separately billable procedures and treating other patients. Critical care was necessary to treat or prevent imminent or life-threatening deterioration. Critical care was time spent personally by me on the following activities: development of treatment plan with patient and/or surrogate as well as nursing, discussions with consultants, evaluation of patient's response to treatment, examination of patient, obtaining history from patient or  surrogate, ordering and performing treatments and interventions, ordering and review of laboratory studies, ordering and review of radiographic studies, pulse oximetry  and re-evaluation of patient's condition.   Labs Review Labs Reviewed  BASIC METABOLIC PANEL - Abnormal; Notable for the following:    Glucose, Bld 180 (*)    Creatinine, Ser 1.39 (*)    Calcium 8.0 (*)    GFR calc non Af Amer 36 (*)    GFR calc Af Amer 42 (*)    Anion gap 2 (*)    All other components within normal limits  CBC WITH DIFFERENTIAL/PLATELET - Abnormal; Notable for the following:    Hemoglobin 10.8 (*)    HCT 34.2 (*)    Neutrophils Relative % 86 (*)    Lymphocytes Relative 9 (*)    All other components within normal limits  BRAIN NATRIURETIC PEPTIDE - Abnormal; Notable for the following:    B Natriuretic Peptide 989.0 (*)    All other components within normal limits  URINALYSIS, ROUTINE W REFLEX MICROSCOPIC - Abnormal; Notable for the following:    Specific Gravity, Urine >1.030 (*)    Hgb urine dipstick TRACE (*)    Protein, ur 30 (*)    All other components within normal limits  URINE MICROSCOPIC-ADD ON - Abnormal; Notable for the following:    Squamous Epithelial / LPF FEW (*)    Bacteria, UA MANY (*)    Casts HYALINE CASTS (*)    All other components within normal limits  URINE CULTURE  TROPONIN I    Imaging Review Dg Chest Port 1 View  04/10/2014   CLINICAL DATA:  Shortness of breath. Onset of symptoms 2 days ago with progressive worsening.  EXAM: PORTABLE CHEST - 1 VIEW  COMPARISON:  05/22/2013.  03/19/2013.  FINDINGS: Cardiopericardial silhouette is partially obscured by perihilar and basilar predominant airspace disease. Cardiomegaly was present on prior studies and is probably present on today's exam. There are also bilateral left-greater-than-right pleural effusions. Probable bilateral basilar atelectasis. Apical lordotic projection is present. Monitoring leads project over the chest.   IMPRESSION: Diffuse basilar predominant airspace disease with left-greater-than-right pleural effusions. Probable cardiomegaly. These findings together along with prior exams are most compatible with moderate CHF.   Electronically Signed   By: Andreas Newport M.D.   On: 04/10/2014 15:10     EKG Interpretation   Date/Time:  Thursday April 10 2014 14:35:27 EST Ventricular Rate:  61 PR Interval:  168 QRS Duration: 102 QT Interval:  477 QTC Calculation: 480 R Axis:   39 Text Interpretation:  Sinus rhythm Atrial premature complex Nonspecific  repol abnormality, lateral leads Baseline wander in lead(s) V6 since last  tracing no significant change Confirmed by Effie Shy  MD, Mechele Collin ([id number]) on  04/10/2014 3:21:04 PM      MDM   Final diagnoses:  Acute congestive heart failure, unspecified congestive heart failure type  Chronic obstructive pulmonary disease, unspecified COPD, unspecified chronic bronchitis type  Hypoxia    Shortness of breath, likely primarily caused by congestive heart failure, fluid overload.  No clear inciting cause.  She likely has a component of associated COPD exacerbation.  Patient has been treated with improvement, in the emergency department.  She has oxygen requirement increased from her baseline.  She will require admission, to a stepdown unit for treatment and close observation.  Nursing Notes Reviewed/ Care Coordinated, and agree without changes. Applicable Imaging Reviewed.  Interpretation of Laboratory Data incorporated into ED treatment   Plan: Admit   I personally performed the services described in this documentation, which was scribed in my presence. The recorded information has been reviewed and is  accurate.      Flint Melter, MD 04/11/14 [phone]

## 2014-04-10 NOTE — ED Notes (Signed)
Patient presented to registration area c/o of SOB, daughter states this began 2 days ago and has gotten progressively worse, sent from PCP office today. Patient was pursed lip breathing, cyanotic, accessory muscle use stats were 60% on 3L/[place]. Patient taken straight back to hallway 1. Dr. Effie Shy is seeing her now.

## 2014-04-10 NOTE — ED Notes (Signed)
Pt states that she feels much better at this time.  Resting with family at bedside.

## 2014-04-10 NOTE — ED Notes (Signed)
Dr Effie Shy notified of pts drop in O2 sats.  Bipap ordered.

## 2014-04-10 NOTE — H&P (Signed)
[facility] History and Physical  DARL BRISBIN ZOX:[id number] DOB: [birth year] DOA: 04/10/2014  Referring physician: ER PCP: Lilyan Punt, MD   Chief Complaint: Dyspnea  HPI: [name] is a 79 y.o. female  This is a 79 year old lady who has quite severe COPD and is oxygen dependent, using 2 L oxygen per minute, who presents now with a 2 day history of increasing shortness of breath. She describes PND and orthopnea. She has had no chest pain or leg swelling. She denies any new cough or fever. There is no nausea, vomiting or abdominal pain. She does have a previous history of coronary artery disease having had PCI of the LAD in July 2011, at which time she presumably sustained a non-ST segment elevation MI. She also has a history of hypertension, carotid artery stenosis, hyperlipidemia and obesity. She is on medication for dementia also. Evaluation in the emergency room shows findings consistent with acute congestive heart failure, initially she was on nasal cannula oxygen and now has deteriorated to the point where she requires BiPAP. However, after she was given intravenous Lasix, she does feel significantly improved. She is being admitted for further management.   Review of Systems:  Apart from symptoms above, all systems negative.  Past Medical History  Diagnosis Date  . CAD (coronary artery disease) 08/2009    s/p PCI of the LAD  . Myocardial infarction     Non-st segment elevated  . Hypertension   . Hyperlipidemia   . Bradycardia   . Carotid artery stenosis   . COPD (chronic obstructive pulmonary disease)   . Chronic headache   . Asthma   . Pneumonia   . Ulnar neuropathy   . Seasonal allergies   . Impaired fasting glucose   . Dementia     patient denies this  . Osteoporosis 2009   Past Surgical History  Procedure Laterality Date  . Partial hysterectomy  1984  . Radial artery catheter      Bladder  . Bladder surgery  1991  . Cardiac catheterization   11/2010    negative  . Left heart catheterization with coronary angiogram N/A 11/05/2012    Procedure: LEFT HEART CATHETERIZATION WITH CORONARY ANGIOGRAM;  Surgeon: Micheline Chapman, MD;  Location: [facility] CATH LAB;  Service: Cardiovascular;  Laterality: N/A;   Social History:  reports that she quit smoking about 6 years ago. She has never used smokeless tobacco. She reports that she does not drink alcohol or use illicit drugs.  Allergies  Allergen Reactions  . Acetaminophen   . Codeine Nausea And Vomiting  . Dilaudid [Hydromorphone Hcl] Other (See Comments)    Extremely sensitive  . Erythromycin Other (See Comments)    Intense stomach pain  . Sulfonamide Derivatives     Family History  Problem Relation Age of Onset  . Addison's disease Mother   . Alcohol abuse Father     Prior to Admission medications   Medication Sig Start Date End Date Taking? Authorizing Provider  albuterol (PROVENTIL HFA;VENTOLIN HFA) 108 (90 BASE) MCG/ACT inhaler Inhale 2 puffs into the lungs every 6 (six) hours as needed for wheezing or shortness of breath. 03/24/14  Yes Babs Sciara, MD  alendronate (FOSAMAX) 70 MG tablet Take 70 mg by mouth every 7 (seven) days. Take with a full glass of water on an empty stomach.   Yes Historical Provider, MD  Alpha-D-Galactosidase Charlyne Quale) TABS Take 2 tablets by mouth 3 times/day as needed-between meals & bedtime.    Yes Historical  Provider, MD  amLODipine (NORVASC) 5 MG tablet TAKE 1 TABLET BY MOUTH EVERY MORNING 01/07/14  Yes Babs Sciara, MD  aspirin 81 MG EC tablet Take 81 mg by mouth every morning.    Yes Historical Provider, MD  budesonide-formoterol (SYMBICORT) 160-4.5 MCG/ACT inhaler Inhale 2 puffs into the lungs 2 (two) times daily. 08/28/13  Yes Babs Sciara, MD  Cholecalciferol (VITAMIN D3) 2000 UNITS TABS Take 1 tablet by mouth every morning.   Yes Historical Provider, MD  citalopram (CELEXA) 40 MG tablet TAKE 1 TABLET BY MOUTH EVERY MORNING 01/08/14  Yes Babs Sciara, MD  donepezil (ARICEPT) 10 MG tablet Take 1 tablet (10 mg total) by mouth at bedtime. 03/25/14  Yes Babs Sciara, MD  erythromycin ophthalmic ointment USE IN THE EYE AS DIRECTED 01/13/14  Yes Babs Sciara, MD  furosemide (LASIX) 80 MG tablet Take 1 tablet (80 mg total) by mouth daily. 05/31/13  Yes Rosalio Macadamia, NP  ibuprofen (ADVIL,MOTRIN) 200 MG tablet Take 200 mg by mouth every 6 (six) hours as needed for headache, mild pain or moderate pain.   Yes Historical Provider, MD  LORazepam (ATIVAN) 1 MG tablet TAKE 1/2 TO 1 TABLET BY MOUTH EVERY NIGHT AT BEDTIME AS NEEDED Patient taking differently: TAKE 1 TABLET BY MOUTH EVERY NIGHT AT BEDTIME AS NEEDED 12/24/13  Yes Babs Sciara, MD  losartan (COZAAR) 100 MG tablet TAKE 1 TABLET BY MOUTH DAILY Patient taking differently: TAKE ONE-HALF TABLET BY MOUTH DAILY 11/25/13  Yes Babs Sciara, MD  metoprolol tartrate (LOPRESSOR) 25 MG tablet Take 0.5 tablets (12.5 mg total) by mouth 2 (two) times daily. 05/22/13  Yes Rosalio Macadamia, NP  nitroGLYCERIN (NITROSTAT) 0.4 MG SL tablet Place 1 tablet (0.4 mg total) under the tongue as needed for chest pain. 07/10/13  Yes Rosalio Macadamia, NP  pravastatin (PRAVACHOL) 40 MG tablet TAKE 1 TABLET BY MOUTH EVERY DAY 01/07/14  Yes Babs Sciara, MD  nitrofurantoin, macrocrystal-monohydrate, (MACROBID) 100 MG capsule Take 1 capsule (100 mg total) by mouth 2 (two) times daily. Patient not taking: Reported on 04/10/2014 12/20/13   Merlyn Albert, MD   Physical Exam: Filed Vitals:   04/10/14 1900 04/10/14 1911 04/10/14 1915 04/10/14 1948  BP: 153/56  156/48 163/60  Pulse: 69  72 78  Temp:      TempSrc:      Resp: 23  27 36  Height:      Weight:      SpO2: 91% 94% 93% 95%    Wt Readings from Last 3 Encounters:  04/10/14 83.915 kg (185 lb)  02/19/14 83.235 kg (183 lb 8 oz)  12/20/13 82.555 kg (182 lb)    General:  Appears calm and comfortable on BiPAP. Obese. Eyes: PERRL, normal lids, irises &  conjunctiva ENT: grossly normal hearing, lips & tongue Neck: no LAD, masses or thyromegaly Cardiovascular: No gallop rhythm. Sinus rhythm. No peripheral pitting edema. Telemetry: SR, no arrhythmias  Respiratory: Difficult to hear air entry due to her obese state but there are some inspiratory basal crackles. Abdomen: soft, ntnd Skin: no rash or induration seen on limited exam Musculoskeletal: grossly normal tone BUE/BLE Psychiatric: grossly normal mood and affect, speech fluent and appropriate Neurologic: grossly non-focal.          Labs on Admission:  Basic Metabolic Panel:  Recent Labs Lab 04/10/14 1433  NA 136  K 4.3  CL 109  CO2 25  GLUCOSE 180*  BUN  21  CREATININE 1.39*  CALCIUM 8.0*   Liver Function Tests: No results for input(s): AST, ALT, ALKPHOS, BILITOT, PROT, ALBUMIN in the last 168 hours. No results for input(s): LIPASE, AMYLASE in the last 168 hours. No results for input(s): AMMONIA in the last 168 hours. CBC:  Recent Labs Lab 04/10/14 1433  WBC 8.3  NEUTROABS 7.2  HGB 10.8*  HCT 34.2*  MCV 87.5  PLT 197   Cardiac Enzymes:  Recent Labs Lab 04/10/14 1433  TROPONINI <0.03    BNP (last 3 results)  Recent Labs  04/10/14 1433  BNP 989.0*    ProBNP (last 3 results)  Recent Labs  05/07/13 1842 05/22/13 1302  PROBNP 3453.0* 761.0*    CBG: No results for input(s): GLUCAP in the last 168 hours.  Radiological Exams on Admission: Dg Chest Port 1 View  04/10/2014   CLINICAL DATA:  Shortness of breath. Onset of symptoms 2 days ago with progressive worsening.  EXAM: PORTABLE CHEST - 1 VIEW  COMPARISON:  05/22/2013.  03/19/2013.  FINDINGS: Cardiopericardial silhouette is partially obscured by perihilar and basilar predominant airspace disease. Cardiomegaly was present on prior studies and is probably present on today's exam. There are also bilateral left-greater-than-right pleural effusions. Probable bilateral basilar atelectasis. Apical  lordotic projection is present. Monitoring leads project over the chest.  IMPRESSION: Diffuse basilar predominant airspace disease with left-greater-than-right pleural effusions. Probable cardiomegaly. These findings together along with prior exams are most compatible with moderate CHF.   Electronically Signed   By: Andreas Newport M.D.   On: 04/10/2014 15:10    EKG: Independently reviewed. Normal sinus rhythm without any acute ST-T wave changes.  Assessment/Plan   1. Acute diastolic congestive heart failure. She does her history from previous records of diastolic dysfunction and I would assume this is diastolic congestive heart failure. However, we will check an echocardiogram. She will be treated with intravenous Lasix. She is already on ARB and beat blockers. We will cycle serial cardiac enzymes. We will obtain cardiology consultation in the morning. She will continue on BiPAP overnight. 2. COPD. This appears to be fairly stable. There is no obvious signs of infection. 3. Hypertension, stable.   Further recommendations will depend on patient's hospital progress.   Code Status: Full code.  DVT Prophylaxis: Lovenox.  Family Communication: I discussed the plan with the patient at the bedside.  Disposition Plan: Home when medically stable.   Time spent: 60 minutes.  Wilson Singer [facility] Pager [phone].

## 2014-04-11 ENCOUNTER — Other Ambulatory Visit: Payer: Self-pay | Admitting: Family Medicine

## 2014-04-11 DIAGNOSIS — I5033 Acute on chronic diastolic (congestive) heart failure: Secondary | ICD-10-CM

## 2014-04-11 DIAGNOSIS — I509 Heart failure, unspecified: Secondary | ICD-10-CM

## 2014-04-11 DIAGNOSIS — N183 Chronic kidney disease, stage 3 unspecified: Secondary | ICD-10-CM | POA: Diagnosis present

## 2014-04-11 DIAGNOSIS — J9611 Chronic respiratory failure with hypoxia: Secondary | ICD-10-CM | POA: Diagnosis present

## 2014-04-11 DIAGNOSIS — J441 Chronic obstructive pulmonary disease with (acute) exacerbation: Secondary | ICD-10-CM

## 2014-04-11 DIAGNOSIS — J9621 Acute and chronic respiratory failure with hypoxia: Secondary | ICD-10-CM

## 2014-04-11 HISTORY — DX: Acute on chronic diastolic (congestive) heart failure: I50.33

## 2014-04-11 LAB — TROPONIN I
Troponin I: <0.03 ng/mL (ref <0.031)
Troponin I: <0.03 ng/mL (ref <0.031)

## 2014-04-11 LAB — BASIC METABOLIC PANEL
Anion gap: 2 — ABNORMAL LOW (ref 5–15)
BUN: 26 mg/dL — ABNORMAL HIGH (ref 6–23)
CALCIUM: 7.9 mg/dL — AB (ref 8.4–10.5)
CHLORIDE: 109 mmol/L (ref 96–112)
CO2: 27 mmol/L (ref 19–32)
CREATININE: 1.6 mg/dL — AB (ref 0.50–1.10)
GFR calc non Af Amer: 30 mL/min — ABNORMAL LOW (ref >90)
GFR, EST AFRICAN AMERICAN: 35 mL/min — AB (ref >90)
Glucose, Bld: 163 mg/dL — ABNORMAL HIGH (ref 70–99)
Potassium: 4.8 mmol/L (ref 3.5–5.1)
Sodium: 138 mmol/L (ref 135–145)

## 2014-04-11 LAB — URINE CULTURE
COLONY COUNT: NO GROWTH
Culture: NO GROWTH

## 2014-04-11 MED ORDER — ALBUTEROL SULFATE (2.5 MG/3ML) 0.083% IN NEBU: 2.5000 mg | INHALATION_SOLUTION | RESPIRATORY_TRACT | Status: DC | PRN

## 2014-04-11 MED ORDER — METHYLPREDNISOLONE SODIUM SUCC 125 MG IJ SOLR
60.0000 mg | Freq: Four times a day (QID) | INTRAMUSCULAR | Status: DC
  Administered 2014-04-11 – 2014-04-14 (×14): 60 mg via INTRAVENOUS
  Filled 2014-04-11 (×14): qty 2

## 2014-04-11 MED ORDER — LEVOFLOXACIN 750 MG PO TABS
750.0000 mg | ORAL_TABLET | ORAL | Status: DC
  Administered 2014-04-11 – 2014-04-15 (×3): 750 mg via ORAL
  Filled 2014-04-11 (×3): qty 1

## 2014-04-11 MED ORDER — CITALOPRAM HYDROBROMIDE 20 MG PO TABS
20.0000 mg | ORAL_TABLET | Freq: Every morning | ORAL | Status: DC
  Administered 2014-04-12 – 2014-04-16 (×5): 20 mg via ORAL
  Filled 2014-04-11 (×5): qty 1

## 2014-04-11 MED ORDER — IPRATROPIUM-ALBUTEROL 0.5-2.5 (3) MG/3ML IN SOLN
3.0000 mL | Freq: Four times a day (QID) | RESPIRATORY_TRACT | Status: DC
  Administered 2014-04-12 – 2014-04-14 (×9): 3 mL via RESPIRATORY_TRACT
  Filled 2014-04-11 (×9): qty 3

## 2014-04-11 MED ORDER — IPRATROPIUM-ALBUTEROL 0.5-2.5 (3) MG/3ML IN SOLN
3.0000 mL | RESPIRATORY_TRACT | Status: DC
  Administered 2014-04-11 (×3): 3 mL via RESPIRATORY_TRACT
  Filled 2014-04-11 (×3): qty 3

## 2014-04-11 NOTE — Care Management Note (Addendum)
    Page 1 of 2   04/16/2014     1:36:35 PM CARE MANAGEMENT NOTE 04/16/2014  Patient:  [name], [name]   Account Number:  [id number]  Date Initiated:  04/11/2014  Documentation initiated by:  Kathyrn Sheriff  Subjective/Objective Assessment:   Pt admitted with COPD/CHF. Pt is from home, lives with her husband and is independnet with ADL's. Pt uses a cane PRN. Pt has home O2 but cannot remember the companies name. Pt also has a neb machine.     Action/Plan:   Pt plans to discharge home. Pt may need HH for follow up or PT. Pt will need PT eval when appropriate. Pt has used [facility] in the past.  No CM needs at this time. Will leave inst. for RN on how to set up [facility] if pt discharged over w/e.   Anticipated DC Date:  04/13/2014   Anticipated DC Plan:  HOME/SELF CARE      DC Planning Services  CM consult      [facility] Choice  HOME HEALTH   Choice offered to / List presented to:  C-1 Patient        HH arranged  HH-1 RN  HH-2 PT      HH agency  [facility]   Status of service:  Completed, signed off Medicare Important Message given?  YES (If response is "NO", the following Medicare IM given date fields will be blank) Date Medicare IM given:  04/11/2014 Medicare IM given by:  Kathyrn Sheriff Date Additional Medicare IM given:  04/15/2014 Additional Medicare IM given by:  Sharrie Rothman  Discharge Disposition:  HOME [facility] SERVICES  Per UR Regulation:  Reviewed for med. necessity/level of care/duration of stay  If discussed at Long Length of Stay Meetings, dates discussed:   04/15/2014    Comments:  04/16/14 1330 Arlyss Queen, RN BSN CM Pt discharged home today with [facility] RN (per pts choice). Emma with [facility] is aware and will collect the pts information from the chart. HH services to start within 48 hours of discharge. No DME needs noted. Pt and pts nurse aware of discharge arrangements.  04/16/14 1045 Arlyss Queen, RN BSN CM Pt did not discharge on 04/15/14  due to increased HR and O2 desaturations while ambulating. Pts MD would like VQ scan to rule out PE and started pt back on IV steroids. Will continue to monitor.  04/15/14 1545 Arlyss Queen, RN BSN CM Anticipate discharge later today with [facility] RN and PT (per pts choice). [name] of [facility] is aware and will collect the pts information from the chart. HH services to start within 48 hours of discharge. No DME needs noted. Pt and pts nurse aware of discharge arrangements.  04/11/2014 1400 Kathyrn Sheriff, RN, MSN, CM

## 2014-04-11 NOTE — Care Management Utilization Note (Signed)
UR completed 

## 2014-04-11 NOTE — Consult Note (Addendum)
CARDIOLOGY CONSULT NOTE   Patient ID: [name] MRN: [id number] DOB/AGE: 11-07-1935 79 y.o.  Admit Date: 04/10/2014 Referring Physician: PTH Primary Physician: Lilyan Punt, MD Consulting Cardiologist: Dina Rich MD Primary Cardiologist : Tonny Bollman MD/Lori Tyrone Sage NP Reason for Consultation: CHF   Clinical Summary [name] is a 79 y.o.female known CAD, s/p PCI of the LAD (2011), O2 dependent COPD, diastolic CHF, hypertension, with complaints of worsening dyspnea over 4 days, since Monday, with wheezing. Her husband at bedside states that Tuesday night she was so weak that she felt into bed and landed on him, instead of normally getting into bed. Thursday am she awoke, was short of breath but went to get her hair done, and stopped on the way to "get a biscuit.". By the time she got home she was feeling worse, and tried to rest. She was unable to do so, and instead presented to ER at the insistence of her family. Family members state that she is very non-compliant with low sodium diet and does not always take lasix daily.   On arrival to ER BP 1868/71, HR 62, O2 Sat 60% on 3/L, R 30. Creatinine 1.39. Hgb 10.8, Hct 34.2. CXR, bilateral pleural effusions, R>L, moderate CHF. She was treated with abluterol, lasix 40 mg daily, NTG gtt, and admitted to ICU.  She has been continued on lasix 60 mg Q 12 hours, ARB and NTG. She has diuresed 1.939 cc. She is currently on BiPAP. Feeling better.    Allergies  Allergen Reactions  . Acetaminophen   . Codeine Nausea And Vomiting  . Dilaudid [Hydromorphone Hcl] Other (See Comments)    Extremely sensitive  . Erythromycin Other (See Comments)    Intense stomach pain  . Sulfonamide Derivatives     Medications Scheduled Medications: . amLODipine  5 mg Oral q morning - 10a  . aspirin EC  81 mg Oral q morning - 10a  . budesonide-formoterol  2 puff Inhalation BID  . cholecalciferol  2,000 Units Oral q morning - 10a  .  citalopram  40 mg Oral q morning - 10a  . donepezil  10 mg Oral QHS  . enoxaparin (LOVENOX) injection  40 mg Subcutaneous Q24H  . erythromycin   Right Eye QHS  . furosemide  60 mg Intravenous Q12H  . losartan  100 mg Oral Daily  . metoprolol tartrate  12.5 mg Oral BID  . pravastatin  40 mg Oral q1800  . sodium chloride  3 mL Intravenous Q12H    Infusions: . nitroGLYCERIN 3 mcg/min (04/11/14 0400)    PRN Medications: LORazepam, nitroGLYCERIN, ondansetron (ZOFRAN) IV   Past Medical History  Diagnosis Date  . CAD (coronary artery disease) 08/2009    s/p PCI of the LAD  . Myocardial infarction     Non-st segment elevated  . Hypertension   . Hyperlipidemia   . Bradycardia   . Carotid artery stenosis   . COPD (chronic obstructive pulmonary disease)   . Chronic headache   . Asthma   . Pneumonia   . Ulnar neuropathy   . Seasonal allergies   . Impaired fasting glucose   . Dementia     patient denies this  . Osteoporosis 2009    Past Surgical History  Procedure Laterality Date  . Partial hysterectomy  1984  . Radial artery catheter      Bladder  . Bladder surgery  1991  . Cardiac catheterization  11/2010    negative  . Left heart catheterization  with coronary angiogram N/A 11/05/2012    Procedure: LEFT HEART CATHETERIZATION WITH CORONARY ANGIOGRAM;  Surgeon: Micheline Chapman, MD;  Location: [facility] CATH LAB;  Service: Cardiovascular;  Laterality: N/A;    Family History  Problem Relation Age of Onset  . Addison's disease Mother   . Alcohol abuse Father     Social History [name] reports that she quit smoking about 6 years ago. She has never used smokeless tobacco. [name] reports that she does not drink alcohol.  Review of Systems Complete review of systems are found to be negative unless outlined in H&P above.  Physical Examination Blood pressure 163/51, pulse 72, temperature 98.8 F (37.1 C), temperature source Axillary, resp. rate 24, height 5\' 3"  (1.6  m), weight 84 kg (185 lb 3 oz), SpO2 97 %.  Intake/Output Summary (Last 24 hours) at 04/11/14 0900 Last data filed at 04/11/14 0500  Gross per 24 hour  Intake  10.09 ml  Output   1950 ml  Net -1939.91 ml    Telemetry: NSR, PAC's   GEN: HEENT: Conjunctiva and lids normal, oropharynx clear with moist mucosa. Neck: Supple, no elevated JVP or carotid bruits, no thyromegaly. Lungs: Clear to auscultation, nonlabored breathing at rest. Cardiac: Regular rate and rhythm, no S3 or significant systolic murmur, no pericardial rub. Abdomen: Soft, nontender, no hepatomegaly, bowel sounds present, no guarding or rebound. Extremities: No pitting edema, distal pulses 2+. Skin: Warm and dry. Musculoskeletal: No kyphosis. Neuropsychiatric: Alert and oriented x3, affect grossly appropriate.  Prior Cardiac Testing/Procedures  1. Cardiac Cath 10/2012: Procedural Findings:  Hemodynamics:  AO 124/50 with a mean of 76  LV 143/80  Coronary angiography:  Coronary dominance: right  Left mainstem: The left main is short. The vessel is widely patent and arises from the left cusp. It divides into the LAD and left circumflex.  Left anterior descending (LAD): The LAD has minor irregularity. The vessel is patent to the left ventricular apex. The stented segment mid LAD is widely patent without significant in-stent restenosis. There is no obstructive disease throughout the distribution of the LAD. There are 3 patent diagonal branches without obstructive disease.  Left circumflex (LCx): The left circumflex is a large, dominant vessel. There is diffuse luminal irregularity. There were 2 obtuse marginal branches, a left posterolateral [name], and the left PDA.  Right coronary artery (RCA): The RCA is nondominant. The vessel is moderately calcified. There is mild proximal 20-30% stenosis.  Left ventriculography: Left ventricular systolic function is vigorous, with an estimated LVEF of 65-70%. There is marked  hypertrophy of the left ventricular apex with obliteration of the distal left ventricular cavity.  Final Conclusions:  1. Continued patency of the stented segment in the LAD  2. Widely patent, dominant left circumflex  3. 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 The patient does have a 20 mm pressure gradient without known aortic valve disease  2. Cardiac MRI 12/28/2012 IMPRESSION:  1. Unusual hypertrophy pattern involving the anteroseptal wall, the  anterior wall, and the apex and peri-apical segments. Normal LV EF.  There was also a non-coronary disease pattern of delayed enhancement  in the subepicardial basal anterior wall and the apical inferior  wall. There was a narrowed LV outflow tract without mitral valve  systolic anterior motion. These findings suggest a variant of  hypertrophic cardiomyopathy.  2. Normal RV size and systolic function.   3.Echo 04/2013 Procedure narrative: Transthoracic echocardiography. Image quality was suboptimal. - Left ventricle: The cavity  size was normal. Systolic function was vigorous. The estimated ejection fraction was in the range of 65% to 70%. Mild posterior wall and severe asymmetric basal septal hypertrophy (1.84 cm). Features are consistent with a pseudonormal left ventricular filling pattern, with concomitant abnormal relaxation and increased filling pressure (grade 2 diastolic dysfunction). Doppler parameters are consistent with high ventricular filling pressure. - Aortic valve: Uncertain number of leaflets. Poorly visualized. Mildly calcified annulus. Mildly thickened leaflets. No transvalvular velocities were obtained. - Mitral valve: Mildly thickened leaflets . Mild mitral annular calcification. Mild regurgitation. - Left atrium: The atrium was moderately dilated. - Systemic veins: IVC dilated with normal respiratory variation. Estimated CVP  8 mmHg.  Lab Results  Basic Metabolic Panel:  Recent Labs Lab 04/10/14 1433 04/11/14 0150  NA 136 138  K 4.3 4.8  CL 109 109  CO2 25 27  GLUCOSE 180* 163*  BUN 21 26*  CREATININE 1.39* 1.60*  CALCIUM 8.0* 7.9*    CBC:  Recent Labs Lab 04/10/14 1433  WBC 8.3  NEUTROABS 7.2  HGB 10.8*  HCT 34.2*  MCV 87.5  PLT 197    Cardiac Enzymes:  Recent Labs Lab 04/10/14 1433 04/10/14 2005 04/11/14 0150 04/11/14 0744  TROPONINI <0.03 <0.03 <0.03 <0.03    BNP: 989  Radiology: Dg Chest Port 1 View  04/10/2014   CLINICAL DATA:  Shortness of breath. Onset of symptoms 2 days ago with progressive worsening.  EXAM: PORTABLE CHEST - 1 VIEW  COMPARISON:  05/22/2013.  03/19/2013.  FINDINGS: Cardiopericardial silhouette is partially obscured by perihilar and basilar predominant airspace disease. Cardiomegaly was present on prior studies and is probably present on today's exam. There are also bilateral left-greater-than-right pleural effusions. Probable bilateral basilar atelectasis. Apical lordotic projection is present. Monitoring leads project over the chest.  IMPRESSION: Diffuse basilar predominant airspace disease with left-greater-than-right pleural effusions. Probable cardiomegaly. These findings together along with prior exams are most compatible with moderate CHF.   Electronically Signed   By: Andreas Newport M.D.   On: 04/10/2014 15:10    ECG: NSR with PAC's rate of 61 bpm.    Impression and Recommendations  1.Acute on Chronic Diastolic CHF with worsening respiratory status: Multifactorial in the setting of dietary and medication non-compliance. She is breathing better now with 1.9 liter fluid output since admission with ongoing diureses using IV lasix 60 mg IV BID.   Last echo in 2015 demonstrated EF of 65%-70%, with Grade II diastolic dysfunction. Creatinine 1.6. Echo is being repeated to assess for changes in LV fx. troponins are negative X 4. Home dose of lasix is 80 mg  daily. Last weight in the office with Dr. Excell Seltzer, 183 lbs. Wt on admission 185 lbs.   2. Hypertension: Remains slightly elevated, likely related to fluid overload. Continue metoprolol, losartan, amlodipine, and NTG.  3. O2 Dependent COPD: Continues mild dyspnea, wearing BiPAP and breathing some better. Keep on BiPAP until she diureses more.   4. Non-compliance: Patient admits to this stating "I think I have earned it." I have counseled her on low sodium diet explaining that she has not "earned" anything but worsening breathing status and hospital admission for CHF. Family is frustrated with her stubbornness about diet and salt intake.   Signed: Bettey Mare. Lawrence NP [facility]  04/11/2014, 9:00 AM Co-Sign MD  Patient seen and discussed with NP Lyman Bishop, I agree with her documentation above. 79 yo female history of CAD with prior PCI to LAD in 2011, COPD on home O2, chronic diastolic  heart failure admitted with SOB and wheezing. Initial presentation was hypertensive and hypoxic with evidence of volume overload, started on NG gtt and treated with diuretics and nebulizers   04/2013 echo: LVEF 65-70%, grade II diastolic dysfunction BNP 989, Hgb 10.8, Plt 197 WBC 8.3 Cr 1.39 GFR 42 Trop neg x 4 CXR bilateral airspace disease and bilateral effusions, moderate pulm edema EKG SR, LAE.   Acute on chronic diastolic HF, likely exacerbating factor is dietary and medication non-compliance. On lasix 60mg  IV bid, negative 1.9 liters. Uptrend in Cr and BUN, though still within her baseline of 1.4-1.8. Continue IV diuretics, follow renal function closely. Follow up repeat echo. Down to NG gtt at 3, SBPs in 150s. Will d/c NG drip, follow pressures after receiving orals this morning, if needed can titrate up norvasc further. Resume home lasix dosing at discharge, from history appears to have become volume overloaded due to non-compliance as opposed to medication failure. Likely component of COPD exacerbation as well,  management per primary team.    Dominga Ferry MD

## 2014-04-11 NOTE — Progress Notes (Signed)
[facility] PROGRESS NOTE  [name]:[id number] DOB: July 23, 1935 DOA: 04/10/2014 PCP: Lilyan Punt, MD  Assessment/Plan: 1. Acute on chronic respiratory failure. The shin is chronically on 2 L of oxygen, although she has been noncompliant with this. She was admitted to the hospital with worsening shortness of breath requiring BiPAP therapy. Her respiratory failure patient be multifactorial related to CHF as well as COPD exacerbation. She appears to be improving with treatments at this time. 2. Acute on chronic diastolic congestive heart failure. Continue the patient on intravenous Lasix. She appears to be responding. Appreciate cardiology assistance. We will hold ARB in the setting of rising creatinine. Nitroglycerin will be discontinued per cardiology recommendations. 3. COPD exacerbation. Appears to be playing a significant role. We'll start the patient on intravenous steroids, continue bronchodilators and start antibiotics. 4. Hypertension. Elevated setting of volume overload. We'll continue to follow. 5. Hyperlipidemia. Continue statin 6. CKD stage III. Continue to follow creatinine in the setting of diuresis.  Code Status: Full code Family Communication: Discussed with multiple family members at the bedside Disposition Plan: Discharge home once improved   Consultants:  Cardiology  Procedures:    Antibiotics:  Levaquin 2/26>>  HPI/Subjective: Shortness of breath is improving. No chest pain  Objective: Filed Vitals:   04/11/14 1230  BP:   Pulse: 67  Temp:   Resp: 19    Intake/Output Summary (Last 24 hours) at 04/11/14 1416 Last data filed at 04/11/14 1255  Gross per 24 hour  Intake  14.59 ml  Output   3600 ml  Net -3585.41 ml   Filed Weights   04/10/14 1421 04/10/14 2030 04/11/14 0500  Weight: 83.915 kg (185 lb) 84.1 kg (185 lb 6.5 oz) 84 kg (185 lb 3 oz)    Exam:   General:  NAD  Cardiovascular: s1, s2, rrr  Respiratory: bilateral  wheezing  Abdomen: soft, nt, nd, bs+  Musculoskeletal: no wheezing   Data Reviewed: Basic Metabolic Panel:  Recent Labs Lab 04/10/14 1433 04/11/14 0150  NA 136 138  K 4.3 4.8  CL 109 109  CO2 25 27  GLUCOSE 180* 163*  BUN 21 26*  CREATININE 1.39* 1.60*  CALCIUM 8.0* 7.9*   Liver Function Tests: No results for input(s): AST, ALT, ALKPHOS, BILITOT, PROT, ALBUMIN in the last 168 hours. No results for input(s): LIPASE, AMYLASE in the last 168 hours. No results for input(s): AMMONIA in the last 168 hours. CBC:  Recent Labs Lab 04/10/14 1433  WBC 8.3  NEUTROABS 7.2  HGB 10.8*  HCT 34.2*  MCV 87.5  PLT 197   Cardiac Enzymes:  Recent Labs Lab 04/10/14 1433 04/10/14 2005 04/11/14 0150 04/11/14 0744  TROPONINI <0.03 <0.03 <0.03 <0.03   BNP (last 3 results)  Recent Labs  04/10/14 1433  BNP 989.0*    ProBNP (last 3 results)  Recent Labs  05/07/13 1842 05/22/13 1302  PROBNP 3453.0* 761.0*    CBG: No results for input(s): GLUCAP in the last 168 hours.  Recent Results (from the past 240 hour(s))  MRSA PCR Screening     Status: None   Collection Time: 04/10/14  8:06 PM  Result Value Ref Range Status   MRSA by PCR NEGATIVE NEGATIVE Final    Comment:        The GeneXpert MRSA Assay (FDA approved for NASAL specimens only), is one component of a comprehensive MRSA colonization surveillance program. It is not intended to diagnose MRSA infection nor to guide or monitor treatment for MRSA infections.  Studies: Dg Chest Port 1 View  04/10/2014   CLINICAL DATA:  Shortness of breath. Onset of symptoms 2 days ago with progressive worsening.  EXAM: PORTABLE CHEST - 1 VIEW  COMPARISON:  05/22/2013.  03/19/2013.  FINDINGS: Cardiopericardial silhouette is partially obscured by perihilar and basilar predominant airspace disease. Cardiomegaly was present on prior studies and is probably present on today's exam. There are also bilateral  left-greater-than-right pleural effusions. Probable bilateral basilar atelectasis. Apical lordotic projection is present. Monitoring leads project over the chest.  IMPRESSION: Diffuse basilar predominant airspace disease with left-greater-than-right pleural effusions. Probable cardiomegaly. These findings together along with prior exams are most compatible with moderate CHF.   Electronically Signed   By: Andreas Newport M.D.   On: 04/10/2014 15:10    Scheduled Meds: . amLODipine  5 mg Oral q morning - 10a  . aspirin EC  81 mg Oral q morning - 10a  . budesonide-formoterol  2 puff Inhalation BID  . cholecalciferol  2,000 Units Oral q morning - 10a  . citalopram  40 mg Oral q morning - 10a  . donepezil  10 mg Oral QHS  . enoxaparin (LOVENOX) injection  40 mg Subcutaneous Q24H  . erythromycin   Right Eye QHS  . furosemide  60 mg Intravenous Q12H  . ipratropium-albuterol  3 mL Nebulization Q4H  . methylPREDNISolone (SOLU-MEDROL) injection  60 mg Intravenous Q6H  . metoprolol tartrate  12.5 mg Oral BID  . pravastatin  40 mg Oral q1800  . sodium chloride  3 mL Intravenous Q12H   Continuous Infusions:   Active Problems:   Dementia   Essential hypertension   COPD exacerbation   Acute diastolic CHF (congestive heart failure)   Hypertrophic obstructive cardiomyopathy   Acute diastolic congestive heart failure   Acute on chronic diastolic heart failure    Time spent:    Cummings,Heather  [facility] Pager [phone]. If 7PM-7AM, please contact night-coverage at [email or web address], password [facility] 04/11/2014, 2:16 PM  LOS: 1 day

## 2014-04-11 NOTE — Progress Notes (Signed)
*  PRELIMINARY RESULTS* Echocardiogram 2D Echocardiogram has been performed.  [name] 04/11/2014, 4:13 PM

## 2014-04-11 NOTE — Progress Notes (Signed)
Levaquin dose adjusted for renal fxn, clCr ~ 30.  78yo F.  Thanks, Valrie Hart, PharmD

## 2014-04-12 LAB — BASIC METABOLIC PANEL
ANION GAP: 5 (ref 5–15)
BUN: 39 mg/dL — AB (ref 6–23)
CALCIUM: 8.3 mg/dL — AB (ref 8.4–10.5)
CO2: 30 mmol/L (ref 19–32)
Chloride: 103 mmol/L (ref 96–112)
Creatinine, Ser: 1.77 mg/dL — ABNORMAL HIGH (ref 0.50–1.10)
GFR calc non Af Amer: 27 mL/min — ABNORMAL LOW (ref >90)
GFR, EST AFRICAN AMERICAN: 31 mL/min — AB (ref >90)
Glucose, Bld: 167 mg/dL — ABNORMAL HIGH (ref 70–99)
POTASSIUM: 4.2 mmol/L (ref 3.5–5.1)
SODIUM: 138 mmol/L (ref 135–145)

## 2014-04-12 MED ORDER — POLYETHYLENE GLYCOL 3350 17 G PO PACK
17.0000 g | PACK | Freq: Every day | ORAL | Status: DC
  Administered 2014-04-12 – 2014-04-15 (×4): 17 g via ORAL
  Filled 2014-04-12 (×4): qty 1

## 2014-04-12 NOTE — Plan of Care (Signed)
Problem: ICU Phase Progression Outcomes Goal: O2 sats trending toward baseline Outcome: Progressing Patient resting at night with BiPaP and maintaining Sats during the day on [place] Goal: Dyspnea controlled at rest Outcome: Progressing Better than before admission by still becomes dyspneic with increased movement Goal: Hemodynamically stable Outcome: Progressing Vital signs are stable currently Goal: Pain controlled with appropriate interventions Outcome: Completed/Met Date Met:  04/12/14 No complaints of pain Goal: Initial discharge plan identified Outcome: Completed/Met Date Met:  04/12/14 Home with husband

## 2014-04-12 NOTE — Progress Notes (Signed)
[facility] PROGRESS NOTE  [name] ZOX:[id number] DOB: Mar 28, 1935 DOA: 04/10/2014 PCP: Lilyan Punt, MD  Assessment/Plan: 1. Acute on chronic respiratory failure. The patient is chronically on 2 L of oxygen, although she has been noncompliant with this. She was admitted to the hospital with worsening shortness of breath requiring BiPAP therapy. Her respiratory failure appears to be multifactorial, related to CHF as well as COPD exacerbation. She appears to be improving with treatments at this time. She did require bipap overnight, so will continue to observe for stability. Continue to wean down oxygen. 2. Acute on chronic diastolic congestive heart failure. Related to noncompliance with diuretics. Continue the patient on intravenous Lasix. She appears to be responding. Appreciate cardiology assistance. We will hold ARB in the setting of rising creatinine. Anticipate that we will transition to oral lasix tomorrow. Repeat chest xray in am. 3. COPD exacerbation. Improving with steroids, continue bronchodilators and antibiotics. Possible transition to prednisone tomorrow 4. Hypertension. Elevated in the setting of volume overload. We'll continue to follow. Starting to improve with diuresis. 5. Hyperlipidemia. Continue statin 6. CKD stage III. Continue to follow creatinine in the setting of diuresis. Baseline creatinine ranges from 1.3-1.8  Code Status: Full code Family Communication: Discussed with multiple family members at the bedside Disposition Plan: Discharge home once improved   Consultants:  Cardiology  Procedures:    Antibiotics:  Levaquin 2/26>>  HPI/Subjective: Feels better. Minimal cough. Feels shortness of breath is improving. No chest pain  Objective: Filed Vitals:   04/12/14 0822  BP:   Pulse:   Temp: 97.4 F (36.3 C)  Resp:     Intake/Output Summary (Last 24 hours) at 04/12/14 0942 Last data filed at 04/12/14 0500  Gross per 24 hour  Intake     243 ml  Output   3300 ml  Net  -3057 ml   Filed Weights   04/10/14 2030 04/11/14 0500 04/12/14 0400  Weight: 84.1 kg (185 lb 6.5 oz) 84 kg (185 lb 3 oz) 80 kg (176 lb 5.9 oz)    Exam:   General:  NAD  Cardiovascular: s1, s2, rrr  Respiratory: diminished breath sounds at bases, wheezing improved  Abdomen: soft, nt, nd, bs+  Musculoskeletal: no edema  Data Reviewed: Basic Metabolic Panel:  Recent Labs Lab 04/10/14 1433 04/11/14 0150 04/12/14 0520  NA 136 138 138  K 4.3 4.8 4.2  CL 109 109 103  CO2 GLUCOSE 180* 163* 167*  BUN 21 26* 39*  CREATININE 1.39* 1.60* 1.77*  CALCIUM 8.0* 7.9* 8.3*   Liver Function Tests: No results for input(s): AST, ALT, ALKPHOS, BILITOT, PROT, ALBUMIN in the last 168 hours. No results for input(s): LIPASE, AMYLASE in the last 168 hours. No results for input(s): AMMONIA in the last 168 hours. CBC:  Recent Labs Lab 04/10/14 1433  WBC 8.3  NEUTROABS 7.2  HGB 10.8*  HCT 34.2*  MCV 87.5  PLT 197   Cardiac Enzymes:  Recent Labs Lab 04/10/14 1433 04/10/14 2005 04/11/14 0150 04/11/14 0744  TROPONINI <0.03 <0.03 <0.03 <0.03   BNP (last 3 results)  Recent Labs  04/10/14 1433  BNP 989.0*    ProBNP (last 3 results)  Recent Labs  05/07/13 1842 05/22/13 1302  PROBNP 3453.0* 761.0*    CBG: No results for input(s): GLUCAP in the last 168 hours.  Recent Results (from the past 240 hour(s))  Urine culture     Status: None   Collection Time: 04/10/14  4:19 PM  Result  Value Ref Range Status   Specimen Description URINE, CATHETERIZED  Final   Special Requests NONE  Final   Colony Count NO GROWTH Performed at [facility]   Final   Culture NO GROWTH Performed at [facility]   Final   Report Status 04/11/2014 FINAL  Final  MRSA PCR Screening     Status: None   Collection Time: 04/10/14  8:06 PM  Result Value Ref Range Status   MRSA by PCR NEGATIVE NEGATIVE Final    Comment:        The  GeneXpert MRSA Assay (FDA approved for NASAL specimens only), is one component of a comprehensive MRSA colonization surveillance program. It is not intended to diagnose MRSA infection nor to guide or monitor treatment for MRSA infections.      Studies: Dg Chest Port 1 View  04/10/2014   CLINICAL DATA:  Shortness of breath. Onset of symptoms 2 days ago with progressive worsening.  EXAM: PORTABLE CHEST - 1 VIEW  COMPARISON:  05/22/2013.  03/19/2013.  FINDINGS: Cardiopericardial silhouette is partially obscured by perihilar and basilar predominant airspace disease. Cardiomegaly was present on prior studies and is probably present on today's exam. There are also bilateral left-greater-than-right pleural effusions. Probable bilateral basilar atelectasis. Apical lordotic projection is present. Monitoring leads project over the chest.  IMPRESSION: Diffuse basilar predominant airspace disease with left-greater-than-right pleural effusions. Probable cardiomegaly. These findings together along with prior exams are most compatible with moderate CHF.   Electronically Signed   By: Andreas Newport M.D.   On: 04/10/2014 15:10    Scheduled Meds: . amLODipine  5 mg Oral q morning - 10a  . aspirin EC  81 mg Oral q morning - 10a  . budesonide-formoterol  2 puff Inhalation BID  . cholecalciferol  2,000 Units Oral q morning - 10a  . citalopram  20 mg Oral q morning - 10a  . donepezil  10 mg Oral QHS  . enoxaparin (LOVENOX) injection  40 mg Subcutaneous Q24H  . erythromycin   Right Eye QHS  . furosemide  60 mg Intravenous Q12H  . ipratropium-albuterol  3 mL Nebulization Q6H  . levofloxacin  750 mg Oral Q48H  . methylPREDNISolone (SOLU-MEDROL) injection  60 mg Intravenous Q6H  . metoprolol tartrate  12.5 mg Oral BID  . pravastatin  40 mg Oral q1800  . sodium chloride  3 mL Intravenous Q12H   Continuous Infusions:   Active Problems:   Dementia   Essential hypertension   Hyperlipidemia   COPD  exacerbation   Acute diastolic CHF (congestive heart failure)   Hypertrophic obstructive cardiomyopathy   Acute diastolic congestive heart failure   Acute on chronic diastolic heart failure   Acute on chronic respiratory failure   CKD (chronic kidney disease) stage 3, GFR 30-59 ml/min    Time spent:    Yovanny Coats  [facility] Pager [phone]. If 7PM-7AM, please contact night-coverage at [email or web address], password [facility] 04/12/2014, 9:42 AM  LOS: 2 days

## 2014-04-13 ENCOUNTER — Inpatient Hospital Stay (HOSPITAL_COMMUNITY): Payer: Medicare Other

## 2014-04-13 LAB — BASIC METABOLIC PANEL
Anion gap: 10 (ref 5–15)
BUN: 43 mg/dL — ABNORMAL HIGH (ref 6–23)
CALCIUM: 8.3 mg/dL — AB (ref 8.4–10.5)
CO2: 30 mmol/L (ref 19–32)
Chloride: 99 mmol/L (ref 96–112)
Creatinine, Ser: 1.62 mg/dL — ABNORMAL HIGH (ref 0.50–1.10)
GFR calc Af Amer: 34 mL/min — ABNORMAL LOW (ref >90)
GFR, EST NON AFRICAN AMERICAN: 30 mL/min — AB (ref >90)
Glucose, Bld: 175 mg/dL — ABNORMAL HIGH (ref 70–99)
Potassium: 3.9 mmol/L (ref 3.5–5.1)
Sodium: 139 mmol/L (ref 135–145)

## 2014-04-13 LAB — URINALYSIS, ROUTINE W REFLEX MICROSCOPIC
Bilirubin Urine: NEGATIVE
GLUCOSE, UA: NEGATIVE mg/dL
Ketones, ur: NEGATIVE mg/dL
NITRITE: NEGATIVE
Protein, ur: NEGATIVE mg/dL
SPECIFIC GRAVITY, URINE: 1.01 (ref 1.005–1.030)
UROBILINOGEN UA: 0.2 mg/dL (ref 0.0–1.0)
pH: 6 (ref 5.0–8.0)

## 2014-04-13 LAB — URINE MICROSCOPIC-ADD ON

## 2014-04-13 LAB — MAGNESIUM: MAGNESIUM: 2.2 mg/dL (ref 1.5–2.5)

## 2014-04-13 MED ORDER — METOPROLOL TARTRATE 25 MG PO TABS
25.0000 mg | ORAL_TABLET | Freq: Two times a day (BID) | ORAL | Status: DC
  Administered 2014-04-13 – 2014-04-16 (×6): 25 mg via ORAL
  Filled 2014-04-13 (×6): qty 1

## 2014-04-13 MED ORDER — POTASSIUM CHLORIDE CRYS ER 20 MEQ PO TBCR
40.0000 meq | EXTENDED_RELEASE_TABLET | Freq: Once | ORAL | Status: AC
  Administered 2014-04-13: 40 meq via ORAL
  Filled 2014-04-13: qty 2

## 2014-04-13 MED ORDER — OXYCODONE HCL 5 MG PO TABS
5.0000 mg | ORAL_TABLET | Freq: Four times a day (QID) | ORAL | Status: DC | PRN
Start: 2014-04-13 — End: 2014-04-13

## 2014-04-13 MED ORDER — ENOXAPARIN SODIUM 30 MG/0.3ML ~~LOC~~ SOLN
30.0000 mg | SUBCUTANEOUS | Status: DC
  Administered 2014-04-13: 30 mg via SUBCUTANEOUS
  Filled 2014-04-13: qty 0.3

## 2014-04-13 MED ORDER — FUROSEMIDE 80 MG PO TABS
80.0000 mg | ORAL_TABLET | Freq: Every day | ORAL | Status: DC
  Administered 2014-04-14 – 2014-04-16 (×3): 80 mg via ORAL
  Filled 2014-04-13 (×3): qty 1

## 2014-04-13 MED ORDER — TRAMADOL HCL 50 MG PO TABS
50.0000 mg | ORAL_TABLET | Freq: Four times a day (QID) | ORAL | Status: DC | PRN
  Administered 2014-04-13 – 2014-04-14 (×2): 50 mg via ORAL
  Filled 2014-04-13 (×2): qty 1

## 2014-04-13 NOTE — Progress Notes (Signed)
[facility] PROGRESS NOTE  [name] ZOX:[id number] DOB: 1935/11/03 DOA: 04/10/2014 PCP: Lilyan Punt, MD  Assessment/Plan: 1. Acute on chronic respiratory failure. The patient is chronically on 2 L of oxygen, although she has been noncompliant with this. She was admitted to the hospital with worsening shortness of breath requiring BiPAP therapy. Her respiratory failure appears to be multifactorial, related to CHF as well as COPD exacerbation. She appears to be improving with treatments at this time. She did require bipap overnight, so will continue to observe for stability. Continue to wean down oxygen. 2. Acute on chronic diastolic congestive heart failure. Related to noncompliance with diuretics. She is on intravenous Lasix. She appears to be responding. Appreciate cardiology assistance. We will hold ARB in the setting of fluctuating creatinine.will transition to oral lasix today. Repeat chest xray today shows improvement in interstitial edema. There is a small pleural effusion on the left. Overall chest xray appears to be improved. Plan to transition to home dose of oral lasix. 3. COPD exacerbation. Improving with steroids, continue bronchodilators and antibiotics. Since she is still wheezing, will continue steroid at same dose for today and likely transition to prednisone tomorrow.  4. Hypertension. Elevated in the setting of volume overload. We'll continue to follow. Starting to improve with diuresis. 5. Hyperlipidemia. Continue statin 6. CKD stage III. Continue to follow creatinine in the setting of diuresis. Baseline creatinine ranges from 1.3-1.8  Code Status: Full code Family Communication: Discussed with multiple family members at the bedside Disposition Plan: Discharge home once improved   Consultants:  Cardiology  Procedures:    Antibiotics:  Levaquin 2/26>>  HPI/Subjective: Feels better. She does notice some wheezing and cough. Did not require bipap  overnight  Objective: Filed Vitals:   04/13/14 1002  BP: 159/59  Pulse:   Temp:   Resp:     Intake/Output Summary (Last 24 hours) at 04/13/14 1056 Last data filed at 04/13/14 1000  Gross per 24 hour  Intake    483 ml  Output   2150 ml  Net  -1667 ml   Filed Weights   04/11/14 0500 04/12/14 0400 04/13/14 0500  Weight: 84 kg (185 lb 3 oz) 80 kg (176 lb 5.9 oz) 79.9 kg (176 lb 2.4 oz)    Exam:   General:  NAD  Cardiovascular: s1, s2, rrr  Respiratory: diminished breath sounds at bases, mild wheeze bilaterally  Abdomen: soft, nt, nd, bs+  Musculoskeletal: no edema  Data Reviewed: Basic Metabolic Panel:  Recent Labs Lab 04/10/14 1433 04/11/14 0150 04/12/14 0520 04/13/14 0541  NA 136 138 138 139  K 4.3 4.8 4.2 3.9  CL 109 109 103 99  CO2 GLUCOSE 180* 163* 167* 175*  BUN 21 26* 39* 43*  CREATININE 1.39* 1.60* 1.77* 1.62*  CALCIUM 8.0* 7.9* 8.3* 8.3*   Liver Function Tests: No results for input(s): AST, ALT, ALKPHOS, BILITOT, PROT, ALBUMIN in the last 168 hours. No results for input(s): LIPASE, AMYLASE in the last 168 hours. No results for input(s): AMMONIA in the last 168 hours. CBC:  Recent Labs Lab 04/10/14 1433  WBC 8.3  NEUTROABS 7.2  HGB 10.8*  HCT 34.2*  MCV 87.5  PLT 197   Cardiac Enzymes:  Recent Labs Lab 04/10/14 1433 04/10/14 2005 04/11/14 0150 04/11/14 0744  TROPONINI <0.03 <0.03 <0.03 <0.03   BNP (last 3 results)  Recent Labs  04/10/14 1433  BNP 989.0*    ProBNP (last 3 results)  Recent Labs  05/07/13  1842 05/22/13 1302  PROBNP 3453.0* 761.0*    CBG: No results for input(s): GLUCAP in the last 168 hours.  Recent Results (from the past 240 hour(s))  Urine culture     Status: None   Collection Time: 04/10/14  4:19 PM  Result Value Ref Range Status   Specimen Description URINE, CATHETERIZED  Final   Special Requests NONE  Final   Colony Count NO GROWTH Performed at [facility]   Final    Culture NO GROWTH Performed at [facility]   Final   Report Status 04/11/2014 FINAL  Final  MRSA PCR Screening     Status: None   Collection Time: 04/10/14  8:06 PM  Result Value Ref Range Status   MRSA by PCR NEGATIVE NEGATIVE Final    Comment:        The GeneXpert MRSA Assay (FDA approved for NASAL specimens only), is one component of a comprehensive MRSA colonization surveillance program. It is not intended to diagnose MRSA infection nor to guide or monitor treatment for MRSA infections.      Studies: Dg Chest 2 View  04/13/2014   CLINICAL DATA:  CHF, shortness of breath  EXAM: CHEST  2 VIEW  COMPARISON:  04/10/2014  FINDINGS: Small to moderate layering left pleural effusion, improved. No frank interstitial edema. No pneumothorax.  Mild cardiomegaly.  IMPRESSION: Small to moderate layering left pleural effusion, improved.  No frank interstitial edema.   Electronically Signed   By: Charline Bills M.D.   On: 04/13/2014 10:34    Scheduled Meds: . amLODipine  5 mg Oral q morning - 10a  . aspirin EC  81 mg Oral q morning - 10a  . budesonide-formoterol  2 puff Inhalation BID  . cholecalciferol  2,000 Units Oral q morning - 10a  . citalopram  20 mg Oral q morning - 10a  . donepezil  10 mg Oral QHS  . enoxaparin (LOVENOX) injection  40 mg Subcutaneous Q24H  . erythromycin   Right Eye QHS  . [START ON 04/14/2014] furosemide  80 mg Oral Daily  . ipratropium-albuterol  3 mL Nebulization Q6H  . levofloxacin  750 mg Oral Q48H  . methylPREDNISolone (SOLU-MEDROL) injection  60 mg Intravenous Q6H  . metoprolol tartrate  12.5 mg Oral BID  . polyethylene glycol  17 g Oral Daily  . pravastatin  40 mg Oral q1800  . sodium chloride  3 mL Intravenous Q12H   Continuous Infusions:   Active Problems:   Dementia   Essential hypertension   Hyperlipidemia   COPD exacerbation   Acute diastolic CHF (congestive heart failure)   Hypertrophic obstructive cardiomyopathy   Acute  diastolic congestive heart failure   Acute on chronic diastolic heart failure   Acute on chronic respiratory failure   CKD (chronic kidney disease) stage 3, GFR 30-59 ml/min    Time spent:    [name],[name]  [facility] Pager [phone]. If 7PM-7AM, please contact night-coverage at [email or web address], password [facility] 04/13/2014, 10:56 AM  LOS: 3 days

## 2014-04-14 LAB — BASIC METABOLIC PANEL
BUN: 44 mg/dL — ABNORMAL HIGH (ref 6–23)
CO2: 33 mmol/L — ABNORMAL HIGH (ref 19–32)
Calcium: 7.9 mg/dL — ABNORMAL LOW (ref 8.4–10.5)
Chloride: 101 mmol/L (ref 96–112)
Creatinine, Ser: 1.58 mg/dL — ABNORMAL HIGH (ref 0.50–1.10)
GFR, EST AFRICAN AMERICAN: 36 mL/min — AB (ref >90)
GFR, EST NON AFRICAN AMERICAN: 31 mL/min — AB (ref >90)
Glucose, Bld: 173 mg/dL — ABNORMAL HIGH (ref 70–99)
POTASSIUM: 4.6 mmol/L (ref 3.5–5.1)
Sodium: 136 mmol/L (ref 135–145)

## 2014-04-14 MED ORDER — IPRATROPIUM-ALBUTEROL 0.5-2.5 (3) MG/3ML IN SOLN
3.0000 mL | Freq: Four times a day (QID) | RESPIRATORY_TRACT | Status: DC
  Administered 2014-04-14 – 2014-04-16 (×8): 3 mL via RESPIRATORY_TRACT
  Filled 2014-04-14 (×8): qty 3

## 2014-04-14 MED ORDER — PREDNISONE 20 MG PO TABS
40.0000 mg | ORAL_TABLET | Freq: Every day | ORAL | Status: DC
  Administered 2014-04-15: 40 mg via ORAL
  Filled 2014-04-14: qty 2

## 2014-04-14 NOTE — Progress Notes (Signed)
[facility] PROGRESS NOTE  [name] ZOX:[id number] DOB: [birth year] DOA: 04/10/2014 PCP: Lilyan Punt, MD  Assessment/Plan: 1. Acute on chronic respiratory failure. The patient is chronically on 2 L of oxygen, although she has been noncompliant with this. She was admitted to the hospital with worsening shortness of breath requiring BiPAP therapy. Her respiratory failure appears to be multifactorial, related to CHF as well as COPD exacerbation. She appears to be improving with treatments at this time. Continue to wean down oxygen. 2. Acute on chronic diastolic congestive heart failure. Related to noncompliance with diuretics. She is back on her home dose of Lasix. Repeat chest x-ray indicated small layering pleural effusion on the left. The patient did become significantly hypoxic on ambulation, will see if thoracentesis would be beneficial in helping the patient's respiratory status. 3. COPD exacerbation. Improving with steroids, continue bronchodilators and antibiotics. Transition to prednisone for steroid taper.  4. Hypertension. Appears to be stable 5. Hyperlipidemia. Continue statin 6. CKD stage III. Continue to follow creatinine in the setting of diuresis. Baseline creatinine ranges from 1.3-1.8  Code Status: Full code Family Communication: Discussed with multiple family members at the bedside Disposition Plan: Discharge home once improved   Consultants:  Cardiology  Procedures:    Antibiotics:  Levaquin 2/26>>  HPI/Subjective: Feels that she is improving. Wheezing has improved. A new complaints  Objective: Filed Vitals:   04/14/14 1507  BP: 153/56  Pulse: 66  Temp: 98.4 F (36.9 C)  Resp: 20    Intake/Output Summary (Last 24 hours) at 04/14/14 1850 Last data filed at 04/14/14 1832  Gross per 24 hour  Intake    606 ml  Output    300 ml  Net    306 ml   Filed Weights   04/12/14 0400 04/13/14 0500 04/14/14 0544  Weight: 80 kg (176 lb 5.9 oz) 79.9 kg  (176 lb 2.4 oz) 79.4 kg (175 lb 0.7 oz)    Exam:   General:  NAD  Cardiovascular: s1, s2, rrr  Respiratory: No wheezing bilaterally, improved breath sounds  Abdomen: soft, nt, nd, bs+  Musculoskeletal: no edema  Data Reviewed: Basic Metabolic Panel:  Recent Labs Lab 04/10/14 1433 04/11/14 0150 04/12/14 0520 04/13/14 0541 04/14/14 0621  NA 136 138 138 139 136  K 4.3 4.8 4.2 3.9 4.6  CL 109 109 103 99 101  CO2 33*  GLUCOSE 180* 163* 167* 175* 173*  BUN 21 26* 39* 43* 44*  CREATININE 1.39* 1.60* 1.77* 1.62* 1.58*  CALCIUM 8.0* 7.9* 8.3* 8.3* 7.9*  MG  --   --   --  2.2  --    Liver Function Tests: No results for input(s): AST, ALT, ALKPHOS, BILITOT, PROT, ALBUMIN in the last 168 hours. No results for input(s): LIPASE, AMYLASE in the last 168 hours. No results for input(s): AMMONIA in the last 168 hours. CBC:  Recent Labs Lab 04/10/14 1433  WBC 8.3  NEUTROABS 7.2  HGB 10.8*  HCT 34.2*  MCV 87.5  PLT 197   Cardiac Enzymes:  Recent Labs Lab 04/10/14 1433 04/10/14 2005 04/11/14 0150 04/11/14 0744  TROPONINI <0.03 <0.03 <0.03 <0.03   BNP (last 3 results)  Recent Labs  04/10/14 1433  BNP 989.0*    ProBNP (last 3 results)  Recent Labs  05/07/13 1842 05/22/13 1302  PROBNP 3453.0* 761.0*    CBG: No results for input(s): GLUCAP in the last 168 hours.  Recent Results (from the past 240 hour(s))  Urine culture  Status: None   Collection Time: 04/10/14  4:19 PM  Result Value Ref Range Status   Specimen Description URINE, CATHETERIZED  Final   Special Requests NONE  Final   Colony Count NO GROWTH Performed at [facility]   Final   Culture NO GROWTH Performed at [facility]   Final   Report Status 04/11/2014 FINAL  Final  MRSA PCR Screening     Status: None   Collection Time: 04/10/14  8:06 PM  Result Value Ref Range Status   MRSA by PCR NEGATIVE NEGATIVE Final    Comment:        The GeneXpert MRSA  Assay (FDA approved for NASAL specimens only), is one component of a comprehensive MRSA colonization surveillance program. It is not intended to diagnose MRSA infection nor to guide or monitor treatment for MRSA infections.      Studies: Dg Chest 2 View  04/13/2014   CLINICAL DATA:  CHF, shortness of breath  EXAM: CHEST  2 VIEW  COMPARISON:  04/10/2014  FINDINGS: Small to moderate layering left pleural effusion, improved. No frank interstitial edema. No pneumothorax.  Mild cardiomegaly.  IMPRESSION: Small to moderate layering left pleural effusion, improved.  No frank interstitial edema.   Electronically Signed   By: Charline Bills M.D.   On: 04/13/2014 10:34    Scheduled Meds: . amLODipine  5 mg Oral q morning - 10a  . aspirin EC  81 mg Oral q morning - 10a  . budesonide-formoterol  2 puff Inhalation BID  . cholecalciferol  2,000 Units Oral q morning - 10a  . citalopram  20 mg Oral q morning - 10a  . donepezil  10 mg Oral QHS  . enoxaparin (LOVENOX) injection  30 mg Subcutaneous Q24H  . erythromycin   Right Eye QHS  . furosemide  80 mg Oral Daily  . ipratropium-albuterol  3 mL Nebulization Q6H WA  . levofloxacin  750 mg Oral Q48H  . metoprolol tartrate  25 mg Oral BID  . polyethylene glycol  17 g Oral Daily  . pravastatin  40 mg Oral q1800  . [START ON 04/15/2014] predniSONE  40 mg Oral Q breakfast  . sodium chloride  3 mL Intravenous Q12H   Continuous Infusions:   Active Problems:   Dementia   Essential hypertension   Hyperlipidemia   COPD exacerbation   Acute diastolic CHF (congestive heart failure)   Hypertrophic obstructive cardiomyopathy   Acute diastolic congestive heart failure   Acute on chronic diastolic heart failure   Acute on chronic respiratory failure   CKD (chronic kidney disease) stage 3, GFR 30-59 ml/min    Time spent:    [name],[name]  [facility] Pager [phone]. If 7PM-7AM, please contact night-coverage at [email or web address],  password [facility] 04/14/2014, 6:50 PM  LOS: 4 days

## 2014-04-14 NOTE — Evaluation (Signed)
Physical Therapy Evaluation Patient Details Name: [name] MRN: [id number] DOB: [birth year] Today's Date: 04/14/2014   History of Present Illness  This is a 79 year old lady who has quite severe COPD and is oxygen dependent, using 2 L oxygen per minute, who presents now with a 2 day history of increasing shortness of breath. She describes PND and orthopnea. She has had no chest pain or leg swelling. She denies any new cough or fever. There is no nausea, vomiting or abdominal pain. She does have a previous history of coronary artery disease having had PCI of the LAD in July 2011, at which time she presumably sustained a non-ST segment elevation MI. She also has a history of hypertension, carotid artery stenosis, hyperlipidemia and obesity. She is on medication for dementia also. Evaluation in the emergency room shows findings consistent with acute congestive heart failure, initially she was on nasal cannula oxygen and now has deteriorated to the point where she requires BiPAP. However, after she was given intravenous Lasix, she does feel significantly improved. She is being admitted for further management.  Clinical Impression   Pt was seen for evaluation, daughter present.  Pt reports feeling much better.  She is on 3 L O2 with O2 sat at rest=93%.  She is deconditioned from baseline and has decreased balance (which has been an ongoing problem for her).  She now needs a walker to stabilize gait but O2 sat decreases to 83% on 3 L O2 with gait.  Sat rebounds to low 90s with rest.  Pt would benefit from HHPT at d/c.    Follow Up Recommendations Home health PT    Equipment Recommendations  None recommended by PT    Recommendations for Other Services   none    Precautions / Restrictions Precautions Precautions: Fall Restrictions Weight Bearing Restrictions: No      Mobility  Bed Mobility Overal bed mobility: Modified Independent                Transfers Overall transfer  level: Modified independent Equipment used: None                Ambulation/Gait Ambulation/Gait assistance: Modified independent (Device/Increase time) Ambulation Distance (Feet): 150 Feet Assistive device: Rolling walker (2 wheeled)     Gait velocity interpretation: at or above normal speed for age/gender General Gait Details: pt on 3L/O2 and sat=83% during gait,:  O2 sat= 91% with rest x 2 min  Stairs            Wheelchair Mobility    Modified Rankin (Stroke Patients Only)       Balance Overall balance assessment: Needs assistance Sitting-balance support: No upper extremity supported;Feet supported Sitting balance-Leahy Scale: Good     Standing balance support: No upper extremity supported;During functional activity Standing balance-Leahy Scale: Poor Standing balance comment: pt unable to amblate without hand held support due to poor balance                             Pertinent Vitals/Pain Pain Assessment: No/denies pain    Home Living Family/patient expects to be discharged to:: Private residence Living Arrangements: Spouse/significant other Available Help at Discharge: Family;Available 24 hours/day Type of Home: House Home Access: Stairs to enter Entrance Stairs-Rails: Right Entrance Stairs-Number of Steps: 6 Home Layout: One level Home Equipment: Walker - 2 wheels;Cane - single point      Prior Function Level of Independence: Independent  Comments: Pt is O2 dependent on 2 L O2...she states that she "staggers" at home at times but has not fallen...she does not know why she has this balance problem     Hand Dominance        Extremity/Trunk Assessment   Upper Extremity Assessment: Generalized weakness           Lower Extremity Assessment: Generalized weakness      Cervical / Trunk Assessment: Kyphotic  Communication   Communication: No difficulties  Cognition Arousal/Alertness: Awake/alert Behavior During  Therapy: WFL for tasks assessed/performed Overall Cognitive Status: History of cognitive impairments - at baseline                      General Comments      Exercises        Assessment/Plan    PT Assessment All further PT needs can be met in the next venue of care  PT Diagnosis     PT Problem List Decreased strength;Decreased activity tolerance;Decreased balance;Cardiopulmonary status limiting activity;Obesity  PT Treatment Interventions     PT Goals (Current goals can be found in the Care Plan section) Acute Rehab PT Goals PT Goal Formulation: All assessment and education complete, DC therapy    Frequency     Barriers to discharge  steps at home      Co-evaluation               End of Session Equipment Utilized During Treatment: Gait belt Activity Tolerance: Patient tolerated treatment well Patient left: in bed;with call bell/phone within reach;with bed alarm set           Time: 7543-6067 PT Time Calculation (min) (ACUTE ONLY): 5 min   Charges:   PT Evaluation $Initial PT Evaluation Tier I: 1 Procedure     PT G CodesSable Feil 04/14/2014, 12:31 PM

## 2014-04-15 ENCOUNTER — Other Ambulatory Visit: Payer: Self-pay | Admitting: Nurse Practitioner

## 2014-04-15 ENCOUNTER — Inpatient Hospital Stay (HOSPITAL_COMMUNITY): Payer: Medicare Other

## 2014-04-15 ENCOUNTER — Telehealth: Payer: Self-pay | Admitting: Family Medicine

## 2014-04-15 LAB — BASIC METABOLIC PANEL
Anion gap: 4 — ABNORMAL LOW (ref 5–15)
BUN: 42 mg/dL — ABNORMAL HIGH (ref 6–23)
CALCIUM: 8 mg/dL — AB (ref 8.4–10.5)
CO2: 33 mmol/L — ABNORMAL HIGH (ref 19–32)
Chloride: 99 mmol/L (ref 96–112)
Creatinine, Ser: 1.59 mg/dL — ABNORMAL HIGH (ref 0.50–1.10)
GFR, EST AFRICAN AMERICAN: 35 mL/min — AB (ref >90)
GFR, EST NON AFRICAN AMERICAN: 30 mL/min — AB (ref >90)
GLUCOSE: 150 mg/dL — AB (ref 70–99)
Potassium: 4.1 mmol/L (ref 3.5–5.1)
SODIUM: 136 mmol/L (ref 135–145)

## 2014-04-15 LAB — MAGNESIUM: MAGNESIUM: 2.3 mg/dL (ref 1.5–2.5)

## 2014-04-15 MED ORDER — ALUM & MAG HYDROXIDE-SIMETH 200-200-20 MG/5ML PO SUSP
30.0000 mL | ORAL | Status: DC | PRN
  Administered 2014-04-15: 30 mL via ORAL
  Filled 2014-04-15: qty 30

## 2014-04-15 MED ORDER — PREDNISONE 20 MG PO TABS: 60.0000 mg | ORAL_TABLET | Freq: Every day | ORAL | Status: DC

## 2014-04-15 MED ORDER — METHYLPREDNISOLONE SODIUM SUCC 125 MG IJ SOLR
60.0000 mg | Freq: Four times a day (QID) | INTRAMUSCULAR | Status: DC
  Administered 2014-04-15 – 2014-04-16 (×3): 60 mg via INTRAVENOUS
  Filled 2014-04-15 (×3): qty 2

## 2014-04-15 NOTE — Progress Notes (Signed)
Patient having runs of Vtach.  Dr. Kerry Hough notified.  Will continue to monitor patient.

## 2014-04-15 NOTE — Progress Notes (Signed)
[facility] PROGRESS NOTE  [name] JSH:[id number] DOB: 1935/07/12 DOA: 04/10/2014 PCP: Lilyan Punt, MD  Summary:  This is a 79 y/o female who is noncompliant with oxygen/diuretics at home. She presented to the hospital with progressive shortness of breath related to CHF and well as COPD exacerbation. She initially required bipap therapy, but has since improved to nasal cannula. She still remains hypoxic and tachycardic on ambulation despite wearing 3L. She has significantly diuresed and has fair airflow bilaterally. Plans are to check VQ scan. Once her respiratory status is stable, she can discharge home with home health  Assessment/Plan: 1. Acute on chronic respiratory failure. The patient is chronically on 2 L of oxygen, although she has been noncompliant with this. She was admitted to the hospital with worsening shortness of breath requiring BiPAP therapy. Her respiratory failure appears to be multifactorial, related to CHF as well as COPD exacerbation. She is still hypoxic on ambulation, although her chest exam has improved. Will check VQ scan. Continue to wean down oxygen. 2. Acute on chronic diastolic congestive heart failure. Related to noncompliance with diuretics. She is back on her home dose of Lasix. Repeat chest x-ray indicated improved interstitial edema with only small pleural effusion. Continue on home dose of lasix. 3. COPD exacerbation. Still becomes hypoxic on 3L and becomes tachycardic on ambulation. Will change back to intravenous steroids for now. Continue abx and bronchodilators  4. Hypertension. Appears to be stable 5. Hyperlipidemia. Continue statin 6. CKD stage III. Continue to follow creatinine in the setting of diuresis. Baseline creatinine ranges from 1.3-1.8  Code Status: Full code Family Communication: Discussed with multiple family members at the bedside Disposition Plan: Discharge home once  improved   Consultants:  Cardiology  Procedures:    Antibiotics:  Levaquin 2/26>>  HPI/Subjective: She is wheezing today. Still feels that her breathing is improving. No chest pain  Objective: Filed Vitals:   04/15/14 2137  BP: 162/58  Pulse: 66  Temp: 98.5 F (36.9 C)  Resp: 20    Intake/Output Summary (Last 24 hours) at 04/15/14 2353 Last data filed at 04/15/14 1900  Gross per 24 hour  Intake   1080 ml  Output    550 ml  Net    530 ml   Filed Weights   04/13/14 0500 04/14/14 0544 04/15/14 0529  Weight: 79.9 kg (176 lb 2.4 oz) 79.4 kg (175 lb 0.7 oz) 79.3 kg (174 lb 13.2 oz)    Exam:   General:  NAD  Cardiovascular: s1, s2, rrr  Respiratory: diminished breath sounds with wheezing bilaterally  Abdomen: soft, nt, nd, bs+  Musculoskeletal: no edema  Data Reviewed: Basic Metabolic Panel:  Recent Labs Lab 04/11/14 0150 04/12/14 0520 04/13/14 0541 04/14/14 0621 04/15/14 0145  NA 138 138 139 136 136  K 4.8 4.2 3.9 4.6 4.1  CL 109 103 99 101 99  CO2 27 30 30  33* 33*  GLUCOSE 163* 167* 175* 173* 150*  BUN 26* 39* 43* 44* 42*  CREATININE 1.60* 1.77* 1.62* 1.58* 1.59*  CALCIUM 7.9* 8.3* 8.3* 7.9* 8.0*  MG  --   --  2.2  --  2.3   Liver Function Tests: No results for input(s): AST, ALT, ALKPHOS, BILITOT, PROT, ALBUMIN in the last 168 hours. No results for input(s): LIPASE, AMYLASE in the last 168 hours. No results for input(s): AMMONIA in the last 168 hours. CBC:  Recent Labs Lab 04/10/14 1433  WBC 8.3  NEUTROABS 7.2  HGB 10.8*  HCT 34.2*  MCV 87.5  PLT 197   Cardiac Enzymes:  Recent Labs Lab 04/10/14 1433 04/10/14 2005 04/11/14 0150 04/11/14 0744  TROPONINI <0.03 <0.03 <0.03 <0.03   BNP (last 3 results)  Recent Labs  04/10/14 1433  BNP 989.0*    ProBNP (last 3 results)  Recent Labs  05/07/13 1842 05/22/13 1302  PROBNP 3453.0* 761.0*    CBG: No results for input(s): GLUCAP in the last 168 hours.  Recent  Results (from the past 240 hour(s))  Urine culture     Status: None   Collection Time: 04/10/14  4:19 PM  Result Value Ref Range Status   Specimen Description URINE, CATHETERIZED  Final   Special Requests NONE  Final   Colony Count NO GROWTH Performed at [facility]   Final   Culture NO GROWTH Performed at [facility]   Final   Report Status 04/11/2014 FINAL  Final  MRSA PCR Screening     Status: None   Collection Time: 04/10/14  8:06 PM  Result Value Ref Range Status   MRSA by PCR NEGATIVE NEGATIVE Final    Comment:        The GeneXpert MRSA Assay (FDA approved for NASAL specimens only), is one component of a comprehensive MRSA colonization surveillance program. It is not intended to diagnose MRSA infection nor to guide or monitor treatment for MRSA infections.      Studies: Dg Chest Port 1 View  04/15/2014   CLINICAL DATA:  Subsequent evaluation of shortness of breath for 2 days  EXAM: PORTABLE CHEST - 1 VIEW  COMPARISON:  04/13/2014  FINDINGS: Mild cardiac enlargement stable. Small left pleural effusion stable. There is mild vascular congestion with no definite pulmonary edema.  IMPRESSION: Not significantly different from prior study with stable small pleural effusion.   Electronically Signed   By: Esperanza Heir M.D.   On: 04/15/2014 17:59    Scheduled Meds: . amLODipine  5 mg Oral q morning - 10a  . aspirin EC  81 mg Oral q morning - 10a  . budesonide-formoterol  2 puff Inhalation BID  . cholecalciferol  2,000 Units Oral q morning - 10a  . citalopram  20 mg Oral q morning - 10a  . donepezil  10 mg Oral QHS  . erythromycin   Right Eye QHS  . furosemide  80 mg Oral Daily  . ipratropium-albuterol  3 mL Nebulization Q6H WA  . levofloxacin  750 mg Oral Q48H  . methylPREDNISolone (SOLU-MEDROL) injection  60 mg Intravenous Q6H  . metoprolol tartrate  25 mg Oral BID  . polyethylene glycol  17 g Oral Daily  . pravastatin  40 mg Oral q1800  . sodium  chloride  3 mL Intravenous Q12H   Continuous Infusions:   Active Problems:   Dementia   Essential hypertension   Hyperlipidemia   COPD exacerbation   Acute diastolic CHF (congestive heart failure)   Hypertrophic obstructive cardiomyopathy   Acute diastolic congestive heart failure   Acute on chronic diastolic heart failure   Acute on chronic respiratory failure   CKD (chronic kidney disease) stage 3, GFR 30-59 ml/min    Time spent:    Jiaire Rosebrook  [facility] Pager [phone]. If 7PM-7AM, please contact night-coverage at [email or web address], password [facility] 04/15/2014, 11:53 PM  LOS: 5 days

## 2014-04-15 NOTE — Progress Notes (Signed)
Pt experienced a 9 beat run of Vtach. I contacted Dr.Kim who put in an order for labs to be drawn. Pt is asymptomatic at this time and I will continue to monitor

## 2014-04-15 NOTE — Telephone Encounter (Signed)
Patient was recently put in the hospital and she has misplaced her lasix. She is hopefully going home today and Misty Stanley wants to know if there is any way that we can send in a refill on this.    [facility]

## 2014-04-15 NOTE — Progress Notes (Signed)
Patient ambulated in hall on 3L O2, tolerated well.  Patient's sats 87-91%, HR 120's, respiratory effort at baseline.  Dr. Kerry Hough notified.  Patient will not be discharged home.  New orders received.  Will continue to monitor patient.

## 2014-04-15 NOTE — Telephone Encounter (Signed)
Pt is still in the hospital. I did not want to refill it with her being under hospital care.

## 2014-04-16 ENCOUNTER — Encounter (HOSPITAL_COMMUNITY): Payer: Self-pay

## 2014-04-16 ENCOUNTER — Inpatient Hospital Stay (HOSPITAL_COMMUNITY): Payer: Medicare Other

## 2014-04-16 LAB — BASIC METABOLIC PANEL
ANION GAP: 9 (ref 5–15)
BUN: 41 mg/dL — ABNORMAL HIGH (ref 6–23)
CHLORIDE: 96 mmol/L (ref 96–112)
CO2: 34 mmol/L — ABNORMAL HIGH (ref 19–32)
Calcium: 8.5 mg/dL (ref 8.4–10.5)
Creatinine, Ser: 1.56 mg/dL — ABNORMAL HIGH (ref 0.50–1.10)
GFR calc non Af Amer: 31 mL/min — ABNORMAL LOW (ref >90)
GFR, EST AFRICAN AMERICAN: 36 mL/min — AB (ref >90)
Glucose, Bld: 189 mg/dL — ABNORMAL HIGH (ref 70–99)
POTASSIUM: 4.9 mmol/L (ref 3.5–5.1)
SODIUM: 139 mmol/L (ref 135–145)

## 2014-04-16 MED ORDER — POLYETHYLENE GLYCOL 3350 17 G PO PACK: 17.0000 g | PACK | Freq: Every day | ORAL | Status: AC | PRN

## 2014-04-16 MED ORDER — ALBUTEROL SULFATE HFA 108 (90 BASE) MCG/ACT IN AERS: 2.0000 | INHALATION_SPRAY | Freq: Three times a day (TID) | RESPIRATORY_TRACT | Status: DC

## 2014-04-16 MED ORDER — LEVOFLOXACIN 750 MG PO TABS: 750.0000 mg | ORAL_TABLET | ORAL | Status: DC

## 2014-04-16 MED ORDER — TECHNETIUM TC 99M DIETHYLENETRIAME-PENTAACETIC ACID
44.0000 | Freq: Once | INTRAVENOUS | Status: AC | PRN
  Administered 2014-04-16: 44 via RESPIRATORY_TRACT

## 2014-04-16 MED ORDER — FUROSEMIDE 80 MG PO TABS: 80.0000 mg | ORAL_TABLET | Freq: Every day | ORAL | Status: DC

## 2014-04-16 MED ORDER — TECHNETIUM TO 99M ALBUMIN AGGREGATED
6.0000 | Freq: Once | INTRAVENOUS | Status: AC | PRN
Start: 2014-04-16 — End: 2014-04-16
  Administered 2014-04-16: 6 via INTRAVENOUS

## 2014-04-16 MED ORDER — PREDNISONE 10 MG PO TABS: ORAL_TABLET | ORAL | Status: DC

## 2014-04-16 NOTE — Telephone Encounter (Signed)
It appears a hospitalist send in some refills. So I believe they have plenty to get them through to their visit. They're scheduled March 11. If Misty Stanley the granddaughter feels that her grandmother needs to be seen sooner we could see this Branden either Monday Tuesday or Wednesday.

## 2014-04-16 NOTE — Discharge Summary (Signed)
Physician Discharge Summary  [name] [id number]:[id number] DOB: 1935/04/24 DOA: 04/10/2014  PCP: Lilyan Punt, MD  Admit date: 04/10/2014 Discharge date: 04/16/2014  Time spent: Greater than 30 minutes  Recommendations for Outpatient Follow-up:  1. Home health registered nurse was ordered for follow-up of medical management and education.  2.   Discharge Diagnoses:  1. Acute on chronic respiratory failure with hypoxia, secondary to diastolic heart failure and COPD with exacerbation. Briefly required BiPAP. 2. Acute on chronic diastolic heart failure. 3. COPD exacerbation.  4. Chronic hypoxic respiratory failure secondary to oxygen dependent COPD. 5. Stage III chronic disease. 6. Hypertension. 7. Hyperlipidemia. 8. CAD with a history of PCI of the LAD, 08/2009.   Discharge Condition: Improved.  Diet recommendation: Heart healthy.  Filed Weights   04/14/14 0544 04/15/14 0529 04/16/14 0614  Weight: 79.4 kg (175 lb 0.7 oz) 79.3 kg (174 lb 13.2 oz) 78.5 kg (173 lb 1 oz)    History of present illness:  The patient is a 79 year old woman with a history of oxygen dependent COPD, coronary artery disease, and chronic kidney disease, who presented to the emergency department on 04/10/2014 with a chief complaint of shortness of breath. She also complained of some orthopnea and PND symptoms. In the ED, she was moderately hypertensive and and hypoxic with a reported oxygen saturation of 60% (query mistake) on 3 L, which improved to 88-93% on a partial nonrebreather. She was afebrile. Respiratory rate was between 22 and 30. Her troponin I was negative. Her chest x-ray revealed findings consistent with moderate CHF. Her proBNP was non-189. She was started on BiPAP in the ED. She was admitted for further evaluation and management.  Hospital Course:   1. Acute on chronic diastolic heart failure. The patient was continued on BiPAP in the ICU. She was started on IV Lasix. Nitroglycerin was added. Her  ARB-losartan and beta blocker-metoprolol were continued. For further evaluation, a number studies were ordered. Her troponin I's were negative 4. Follow-up 2-D echocardiogram revealed preserved left ventricular systolic function and stable stage II diastolic dysfunction. Cardiology was consulted and agreed with medical management. They noted that the patient had not been consistently compliant with following a low-salt diet and/or taking Lasix. She was encouraged to be more compliant. Over the course of the hospitalization, she diuresed over 7 L. Lasix was changed back to by mouth, her home dose of 80 mg daily.  2. COPD with exacerbation. The patient was started on Levaquin, IV Solu-Medrol, and bronchodilators. She had remained relatively hypoxic with ambulation on oxygen supplementation following several days of treatment and improvement in her chest x-ray. Therefore, VQ scan was ordered. The VQ scan was "normal". Her oxygen saturations improved on 2-3 L nasal cannula oxygen. IV Solu-Medrol was titrated off. She was discharged on 2 more days of Levaquin for a total of 7-1/2 days of treatment and 6 more days of a prednisone taper and ongoing bronchodilator therapy with albuterol MDI.  3. Acute on chronic respiratory failure with hypoxia. Resolved acute respiratory failure. She was discharged on oxygen at 2-3 L/m per her previous home dosing.  4. Hypertension. Her blood pressure was moderately elevated on admission, but in general, remained fairly controlled on losartan and metoprolol.  5. Stage III chronic kidney disease. Her creatinine remained stable during hospitalization, ranging from 1.4-1.7. Her baseline creatinine ranges from 1.3-1.8.     Procedures:  2-D echocardiogram on 04/11/14: Study Conclusions - Left ventricle: The cavity size was normal. Wall thickness was increased in a pattern  of mild LVH. Systolic function was vigorous. The estimated ejection fraction was in the range  of 65% to 70%. Wall motion was normal; there were no regional wall motion abnormalities. Features are consistent with a pseudonormal left ventricular filling pattern, with concomitant abnormal relaxation and increased filling pressure (grade 2 diastolic dysfunction). - Aortic valve: Mildly calcified annulus. Trileaflet; mildly thickened leaflets. There was trivial regurgitation. Valve area (VTI): 1.83 cm^2. Valve area (Vmax): 1.74 cm^2. Valve area (Vmean): 2.02 cm^2. - Left atrium: The atrium was moderately dilated. - Right atrium: The atrium was mildly dilated. - Pericardium, extracardiac: There is a large left pleural effusion. There was no pericardial effusion. - Technically difficult study.  Consultations:  None  Discharge Exam: Filed Vitals:   04/16/14 0855  BP:   Pulse: 62  Temp:   Resp:    temperature 98.6. Pulse 62. Respiratory rate 20. Blood pressure 148/60. Oxygen saturation 94% on 2 half liters of oxygen.  General: Pleasant alert 79 year old woman sitting up in bed, in no acute distress. Cardiovascular: S1, S2, with a soft systolic murmur. Respiratory: Mostly clear with rare wheezes. Breathing nonlabored.  Discharge Instructions   Discharge Instructions    Diet - low sodium heart healthy    Complete by:  As directed      Discharge instructions    Complete by:  As directed   Where your oxygen at 2-3 L/m. Take medications as prescribed. Follow-up with your primary care physician.     Increase activity slowly    Complete by:  As directed           Current Discharge Medication List    START taking these medications   Details  furosemide (LASIX) 80 MG tablet Take 1 tablet (80 mg total) by mouth daily. Qty: 30 tablet, Refills: 6    levofloxacin (LEVAQUIN) 750 MG tablet Take 1 tablet (750 mg total) by mouth every other day. Antibiotic; start taking tomorrow for 2 more days. Qty: 2 tablet, Refills: 0    polyethylene glycol (MIRALAX /  GLYCOLAX) packet Take 17 g by mouth daily as needed for moderate constipation.    predniSONE (DELTASONE) 10 MG tablet Starting tomorrow, take 6 tablets daily for 1 day; then 5 tablets the next day for 1 day; then 4 tablets the next day for 1 day; then 3 tablets the next day for 1 day; then 2 tablets the next day for 1 day; then 1 tablet the next day; then stop. Qty: 21 tablet, Refills: 0      CONTINUE these medications which have CHANGED   Details  albuterol (PROVENTIL HFA;VENTOLIN HFA) 108 (90 BASE) MCG/ACT inhaler Inhale 2 puffs into the lungs 3 (three) times daily. Qty: 1 Inhaler, Refills: 3      CONTINUE these medications which have NOT CHANGED   Details  alendronate (FOSAMAX) 70 MG tablet Take 70 mg by mouth every 7 (seven) days. Take with a full glass of water on an empty stomach.    Alpha-D-Galactosidase (BEANO) TABS Take 2 tablets by mouth 3 times/day as needed-between meals & bedtime.     amLODipine (NORVASC) 5 MG tablet TAKE 1 TABLET BY MOUTH EVERY MORNING Qty: 90 tablet, Refills: 1    aspirin 81 MG EC tablet Take 81 mg by mouth every morning.     budesonide-formoterol (SYMBICORT) 160-4.5 MCG/ACT inhaler Inhale 2 puffs into the lungs 2 (two) times daily. Qty: 1 Inhaler, Refills: 12    Cholecalciferol (VITAMIN D3) 2000 UNITS TABS Take 1 tablet by mouth  every morning.    citalopram (CELEXA) 40 MG tablet TAKE 1 TABLET BY MOUTH EVERY MORNING Qty: 90 tablet, Refills: 0    donepezil (ARICEPT) 10 MG tablet Take 1 tablet (10 mg total) by mouth at bedtime. Qty: 90 tablet, Refills: 0    erythromycin ophthalmic ointment USE IN THE EYE AS DIRECTED Qty: 3.5 g, Refills: 0    ibuprofen (ADVIL,MOTRIN) 200 MG tablet Take 200 mg by mouth every 6 (six) hours as needed for headache, mild pain or moderate pain.    LORazepam (ATIVAN) 1 MG tablet TAKE 1/2 TO 1 TABLET BY MOUTH EVERY NIGHT AT BEDTIME AS NEEDED Qty: 30 tablet, Refills: 4    losartan (COZAAR) 100 MG tablet TAKE 1 TABLET BY  MOUTH DAILY Qty: 90 tablet, Refills: 1    metoprolol tartrate (LOPRESSOR) 25 MG tablet Take 0.5 tablets (12.5 mg total) by mouth 2 (two) times daily. Qty: 180 tablet, Refills: 3   Associated Diagnoses: Coronary atherosclerosis of native coronary artery    nitroGLYCERIN (NITROSTAT) 0.4 MG SL tablet Place 1 tablet (0.4 mg total) under the tongue as needed for chest pain. Qty: 25 tablet, Refills: 11    pravastatin (PRAVACHOL) 40 MG tablet TAKE 1 TABLET BY MOUTH EVERY DAY Qty: 90 tablet, Refills: 1      STOP taking these medications     nitrofurantoin, macrocrystal-monohydrate, (MACROBID) 100 MG capsule        Allergies  Allergen Reactions  . Acetaminophen   . Codeine Nausea And Vomiting  . Dilaudid [Hydromorphone Hcl] Other (See Comments)    Extremely sensitive  . Erythromycin Other (See Comments)    Intense stomach pain  . Sulfonamide Derivatives    Follow-up Information    Follow up with [facility].   Contact information:   [address] [place] [address] [phone]       Follow up with Lilyan Punt, MD On 04/16/2014.   Specialty:  Family Medicine   Contact information:   [address] [place] [address] [phone]        The results of significant diagnostics from this hospitalization (including imaging, microbiology, ancillary and laboratory) are listed below for reference.    Significant Diagnostic Studies: Dg Chest 2 View  04/13/2014   CLINICAL DATA:  CHF, shortness of breath  EXAM: CHEST  2 VIEW  COMPARISON:  04/10/2014  FINDINGS: Small to moderate layering left pleural effusion, improved. No frank interstitial edema. No pneumothorax.  Mild cardiomegaly.  IMPRESSION: Small to moderate layering left pleural effusion, improved.  No frank interstitial edema.   Electronically Signed   By: Charline Bills M.D.   On: 04/13/2014 10:34   Nm Pulmonary Perf And Vent  04/16/2014   CLINICAL DATA:  Shortness of breath past  few weeks.  EXAM: NUCLEAR MEDICINE VENTILATION - PERFUSION LUNG SCAN  TECHNIQUE: Ventilation images were obtained in multiple projections using inhaled aerosol technetium 99 M DTPA. Perfusion images were obtained in multiple projections after intravenous injection of Tc-31m MAA.  RADIOPHARMACEUTICALS:  44.0 MCi Tc-30m DTPA aerosol and 6.0 mCi Tc-48m MAA  COMPARISON:  Chest x-ray 04/15/2014 and 04/13/2014  FINDINGS: Ventilation: No focal ventilation defect.  Perfusion: No wedge shaped peripheral perfusion defects to suggest acute pulmonary embolism. Mild central airway radiotracer clumping right worse than left.  IMPRESSION: Normal ventilation perfusion lung scan.   Electronically Signed   By: Elberta Fortis M.D.   On: 04/16/2014 12:19   Dg Chest Port 1 View  04/15/2014  CLINICAL DATA:  Subsequent evaluation of shortness of breath for 2 days  EXAM: PORTABLE CHEST - 1 VIEW  COMPARISON:  04/13/2014  FINDINGS: Mild cardiac enlargement stable. Small left pleural effusion stable. There is mild vascular congestion with no definite pulmonary edema.  IMPRESSION: Not significantly different from prior study with stable small pleural effusion.   Electronically Signed   By: Esperanza Heir M.D.   On: 04/15/2014 17:59   Dg Chest Port 1 View  04/10/2014   CLINICAL DATA:  Shortness of breath. Onset of symptoms 2 days ago with progressive worsening.  EXAM: PORTABLE CHEST - 1 VIEW  COMPARISON:  05/22/2013.  03/19/2013.  FINDINGS: Cardiopericardial silhouette is partially obscured by perihilar and basilar predominant airspace disease. Cardiomegaly was present on prior studies and is probably present on today's exam. There are also bilateral left-greater-than-right pleural effusions. Probable bilateral basilar atelectasis. Apical lordotic projection is present. Monitoring leads project over the chest.  IMPRESSION: Diffuse basilar predominant airspace disease with left-greater-than-right pleural effusions. Probable cardiomegaly.  These findings together along with prior exams are most compatible with moderate CHF.   Electronically Signed   By: Andreas Newport M.D.   On: 04/10/2014 15:10    Microbiology: Recent Results (from the past 240 hour(s))  Urine culture     Status: None   Collection Time: 04/10/14  4:19 PM  Result Value Ref Range Status   Specimen Description URINE, CATHETERIZED  Final   Special Requests NONE  Final   Colony Count NO GROWTH Performed at [facility]   Final   Culture NO GROWTH Performed at [facility]   Final   Report Status 04/11/2014 FINAL  Final  MRSA PCR Screening     Status: None   Collection Time: 04/10/14  8:06 PM  Result Value Ref Range Status   MRSA by PCR NEGATIVE NEGATIVE Final    Comment:        The GeneXpert MRSA Assay (FDA approved for NASAL specimens only), is one component of a comprehensive MRSA colonization surveillance program. It is not intended to diagnose MRSA infection nor to guide or monitor treatment for MRSA infections.      Labs: Basic Metabolic Panel:  Recent Labs Lab 04/12/14 0520 04/13/14 0541 04/14/14 0621 04/15/14 0145 04/16/14 0614  NA 138 139 136 136 139  K 4.2 3.9 4.6 4.1 4.9  CL 103 99 101 99 96  CO2 30 30 33* 33* 34*  GLUCOSE 167* 175* 173* 150* 189*  BUN 39* 43* 44* 42* 41*  CREATININE 1.77* 1.62* 1.58* 1.59* 1.56*  CALCIUM 8.3* 8.3* 7.9* 8.0* 8.5  MG  --  2.2  --  2.3  --    Liver Function Tests: No results for input(s): AST, ALT, ALKPHOS, BILITOT, PROT, ALBUMIN in the last 168 hours. No results for input(s): LIPASE, AMYLASE in the last 168 hours. No results for input(s): AMMONIA in the last 168 hours. CBC:  Recent Labs Lab 04/10/14 1433  WBC 8.3  NEUTROABS 7.2  HGB 10.8*  HCT 34.2*  MCV 87.5  PLT 197   Cardiac Enzymes:  Recent Labs Lab 04/10/14 1433 04/10/14 2005 04/11/14 0150 04/11/14 0744  TROPONINI <0.03 <0.03 <0.03 <0.03   BNP: BNP (last 3 results)  Recent Labs   04/10/14 1433  BNP 989.0*    ProBNP (last 3 results)  Recent Labs  05/07/13 1842 05/22/13 1302  PROBNP 3453.0* 761.0*    CBG: No results for input(s): GLUCAP in the last 168 hours.  Signed:  Jove Beyl  [facility] 04/16/2014, 1:20 PM

## 2014-04-16 NOTE — Progress Notes (Signed)
Pt discharged home today per Dr. Fisher.  Pt's IV site D/C'd and WDL.  Pt's VSS.  Pt provided with home medication list, discharge instructions and prescriptions.  Verbalized understanding.  Pt left floor via WC in stable condition accompanied by NT. 

## 2014-04-17 ENCOUNTER — Telehealth: Payer: Self-pay | Admitting: Family Medicine

## 2014-04-17 DIAGNOSIS — I2510 Atherosclerotic heart disease of native coronary artery without angina pectoris: Secondary | ICD-10-CM | POA: Diagnosis not present

## 2014-04-17 DIAGNOSIS — I252 Old myocardial infarction: Secondary | ICD-10-CM | POA: Diagnosis not present

## 2014-04-17 DIAGNOSIS — J449 Chronic obstructive pulmonary disease, unspecified: Secondary | ICD-10-CM | POA: Diagnosis not present

## 2014-04-17 DIAGNOSIS — E785 Hyperlipidemia, unspecified: Secondary | ICD-10-CM | POA: Diagnosis not present

## 2014-04-17 DIAGNOSIS — I5031 Acute diastolic (congestive) heart failure: Secondary | ICD-10-CM | POA: Diagnosis not present

## 2014-04-17 DIAGNOSIS — F0390 Unspecified dementia without behavioral disturbance: Secondary | ICD-10-CM | POA: Diagnosis not present

## 2014-04-17 DIAGNOSIS — I10 Essential (primary) hypertension: Secondary | ICD-10-CM | POA: Diagnosis not present

## 2014-04-17 NOTE — Telephone Encounter (Signed)
Patient's granddaughter notified and verbalized understanding 

## 2014-04-17 NOTE — Telephone Encounter (Signed)
Louise a nurse from [facility] is requesting that a nebulizer and albuterol sulfate 2.5 mg be called in for her COPD.   [facility].

## 2014-04-17 NOTE — Telephone Encounter (Signed)
I would not recommend a cardiac monitor. I reviewed her notes in the hospital. Keep appt for next week, bring all meds to that visit

## 2014-04-17 NOTE — Telephone Encounter (Signed)
[name] is hopiong you could look into Kamiah's chart and see if she needs a cardiac monitor. She was told by one of the docs that [facility] needed to be on a monitor for 10 days after being discharged. She never received a monitor. [name] said one doc said that she needed it, another doc said that she did not. Can you look into this? Home Health is coming today as well.

## 2014-04-17 NOTE — Telephone Encounter (Signed)
She may have the nebulizer along with 2 boxes of albuterol use every 4 hours when necessary for COPD/reactive airway

## 2014-04-17 NOTE — Progress Notes (Signed)
UR chart review completed.  

## 2014-04-18 ENCOUNTER — Encounter: Payer: Self-pay | Admitting: Family Medicine

## 2014-04-18 MED ORDER — ALBUTEROL SULFATE (2.5 MG/3ML) 0.083% IN NEBU: 2.5000 mg | INHALATION_SOLUTION | RESPIRATORY_TRACT | Status: DC | PRN

## 2014-04-18 NOTE — Telephone Encounter (Signed)
RX sent to [facility]. No number to call back and notify.

## 2014-04-18 NOTE — Addendum Note (Signed)
Addended byOneal Deputy D on: 04/18/2014 08:13 AM   Modules accepted: Orders

## 2014-04-18 NOTE — Addendum Note (Signed)
Addended byOneal Deputy D on: 04/18/2014 08:23 AM   Modules accepted: Orders

## 2014-04-19 DIAGNOSIS — I2510 Atherosclerotic heart disease of native coronary artery without angina pectoris: Secondary | ICD-10-CM | POA: Diagnosis not present

## 2014-04-19 DIAGNOSIS — I5031 Acute diastolic (congestive) heart failure: Secondary | ICD-10-CM | POA: Diagnosis not present

## 2014-04-19 DIAGNOSIS — I252 Old myocardial infarction: Secondary | ICD-10-CM | POA: Diagnosis not present

## 2014-04-19 DIAGNOSIS — J449 Chronic obstructive pulmonary disease, unspecified: Secondary | ICD-10-CM | POA: Diagnosis not present

## 2014-04-19 DIAGNOSIS — I10 Essential (primary) hypertension: Secondary | ICD-10-CM | POA: Diagnosis not present

## 2014-04-19 DIAGNOSIS — F0390 Unspecified dementia without behavioral disturbance: Secondary | ICD-10-CM | POA: Diagnosis not present

## 2014-04-22 DIAGNOSIS — I10 Essential (primary) hypertension: Secondary | ICD-10-CM | POA: Diagnosis not present

## 2014-04-22 DIAGNOSIS — I5031 Acute diastolic (congestive) heart failure: Secondary | ICD-10-CM | POA: Diagnosis not present

## 2014-04-22 DIAGNOSIS — I2510 Atherosclerotic heart disease of native coronary artery without angina pectoris: Secondary | ICD-10-CM | POA: Diagnosis not present

## 2014-04-22 DIAGNOSIS — I252 Old myocardial infarction: Secondary | ICD-10-CM | POA: Diagnosis not present

## 2014-04-22 DIAGNOSIS — J449 Chronic obstructive pulmonary disease, unspecified: Secondary | ICD-10-CM | POA: Diagnosis not present

## 2014-04-22 DIAGNOSIS — F0390 Unspecified dementia without behavioral disturbance: Secondary | ICD-10-CM | POA: Diagnosis not present

## 2014-04-23 ENCOUNTER — Telehealth: Payer: Self-pay | Admitting: *Deleted

## 2014-04-23 NOTE — Telephone Encounter (Signed)
S/w granddaughter who is on pt's DPR. Pt is not tracking bp at this time.  I stated to start a log and bring to next visit wherever that may be.  Granddaughter verbally agreed. Pt is not currently taking bp.  I stated could not really do anything about medication at this time because we really don't know what bp is.  Will FYI [name], NP.

## 2014-04-23 NOTE — Telephone Encounter (Signed)
I agree. Not able to adjust medicines if no BP record. Would stay with current regimen until otherwise noted.

## 2014-04-23 NOTE — Telephone Encounter (Signed)
-----   Message from Rosalio Macadamia, NP sent at 04/23/2014  7:31 AM EST ----- All I see from the discharge summary is 100 mg of losartan daily.  What is BP currently running???  Needs to bring a BP log to wherever her next visit is.   [name]   ----- Message -----    From: Debbe Bales    Sent: 04/22/2014   1:20 PM      To: Rosalio Macadamia, NP  Hey, Will you look at this please. Her granddaughter now works here and she was concerned because of bp being low and wanted you to take a look.  Thanks,  Dasie Chancellor   ----- Message -----    From: Rayfield Citizen, NT    Sent: 04/22/2014   1:01 PM      To: Ambrose Finland  DOB [birth year]  Losartan was ordered half dose at last visit since hospital visit hospitalist said increase to full dose. Just wanted your opinion.    No rush.   Thanks [employer]

## 2014-04-25 ENCOUNTER — Encounter: Payer: Self-pay | Admitting: Family Medicine

## 2014-04-25 ENCOUNTER — Ambulatory Visit (INDEPENDENT_AMBULATORY_CARE_PROVIDER_SITE_OTHER): Payer: Medicare Other | Admitting: Family Medicine

## 2014-04-25 VITALS — BP 136/96 | Ht 63.0 in | Wt 174.0 lb

## 2014-04-25 DIAGNOSIS — R7303 Prediabetes: Secondary | ICD-10-CM

## 2014-04-25 DIAGNOSIS — R7989 Other specified abnormal findings of blood chemistry: Secondary | ICD-10-CM

## 2014-04-25 DIAGNOSIS — I10 Essential (primary) hypertension: Secondary | ICD-10-CM

## 2014-04-25 DIAGNOSIS — I5031 Acute diastolic (congestive) heart failure: Secondary | ICD-10-CM | POA: Diagnosis not present

## 2014-04-25 DIAGNOSIS — R7309 Other abnormal glucose: Secondary | ICD-10-CM

## 2014-04-25 DIAGNOSIS — R748 Abnormal levels of other serum enzymes: Secondary | ICD-10-CM

## 2014-04-25 DIAGNOSIS — N289 Disorder of kidney and ureter, unspecified: Secondary | ICD-10-CM | POA: Diagnosis not present

## 2014-04-25 NOTE — Progress Notes (Signed)
   Subjective:    Patient ID: [name], female    DOB: 27-Jun-1935, 79 y.o.   MRN: [id number]  HPI Patient is here today for a hospitalization follow up.  She went on 2/25 and was there for 6 days for CHF.  Patient says she feels so much better now. Went over all of the findings that were going on during the hospitalization. Went over her lab work. Went over echo. 25 minutes spent with patient and family discussing her diagnosis her testing her current medicine list and the importance of adherence to medication No complaints.     Review of Systems    denies fever chills vomiting diarrhea denies chest pressure Objective:   Physical Exam Lungs are clear hearts regular pulse normal trace edema in the ankles not severe patient coherent mental status stable       Assessment & Plan:  No greater than 2 liters of fluid intake per day. Continue current medications.  I recommend daily weights but there is no need to do twice daily weights  Patient does need metabolic 7 because her renal insufficiency. She also needs hemoglobin A1c.  COPD stable using nebulizer when necessary she is to follow-up in 4 weeks

## 2014-04-26 DIAGNOSIS — I5031 Acute diastolic (congestive) heart failure: Secondary | ICD-10-CM | POA: Diagnosis not present

## 2014-04-26 DIAGNOSIS — I10 Essential (primary) hypertension: Secondary | ICD-10-CM | POA: Diagnosis not present

## 2014-04-26 DIAGNOSIS — I252 Old myocardial infarction: Secondary | ICD-10-CM | POA: Diagnosis not present

## 2014-04-26 DIAGNOSIS — I2510 Atherosclerotic heart disease of native coronary artery without angina pectoris: Secondary | ICD-10-CM | POA: Diagnosis not present

## 2014-04-26 DIAGNOSIS — F0390 Unspecified dementia without behavioral disturbance: Secondary | ICD-10-CM | POA: Diagnosis not present

## 2014-04-26 DIAGNOSIS — J449 Chronic obstructive pulmonary disease, unspecified: Secondary | ICD-10-CM | POA: Diagnosis not present

## 2014-04-26 LAB — HEMOGLOBIN A1C
ESTIMATED AVERAGE GLUCOSE: 134 mg/dL
HEMOGLOBIN A1C: 6.3 % — AB (ref 4.8–5.6)

## 2014-04-26 LAB — BASIC METABOLIC PANEL
BUN / CREAT RATIO: 13 (ref 11–26)
BUN: 22 mg/dL (ref 8–27)
CALCIUM: 8.6 mg/dL — AB (ref 8.7–10.3)
CHLORIDE: 94 mmol/L — AB (ref 97–108)
CO2: 29 mmol/L (ref 18–29)
CREATININE: 1.76 mg/dL — AB (ref 0.57–1.00)
GFR, EST AFRICAN AMERICAN: 32 mL/min/{1.73_m2} — AB (ref >59)
GFR, EST NON AFRICAN AMERICAN: 28 mL/min/{1.73_m2} — AB (ref >59)
Glucose: 117 mg/dL — ABNORMAL HIGH (ref 65–99)
Potassium: 4.5 mmol/L (ref 3.5–5.2)
SODIUM: 140 mmol/L (ref 134–144)

## 2014-04-30 MED ORDER — FUROSEMIDE 40 MG PO TABS: 60.0000 mg | ORAL_TABLET | Freq: Every day | ORAL | Status: DC

## 2014-04-30 NOTE — Addendum Note (Signed)
Addended byOneal Deputy D on: 04/30/2014 09:11 AM   Modules accepted: Orders, Medications

## 2014-04-30 NOTE — Progress Notes (Signed)
[facility] 3/16 on Stacy's cell. BW order is in. Med is sent.

## 2014-05-01 ENCOUNTER — Encounter: Payer: Self-pay | Admitting: Cardiovascular Disease

## 2014-05-01 ENCOUNTER — Other Ambulatory Visit (HOSPITAL_COMMUNITY): Payer: Medicare Other

## 2014-05-01 ENCOUNTER — Ambulatory Visit (INDEPENDENT_AMBULATORY_CARE_PROVIDER_SITE_OTHER): Payer: Medicare Other | Admitting: Cardiovascular Disease

## 2014-05-01 ENCOUNTER — Other Ambulatory Visit: Payer: Self-pay

## 2014-05-01 VITALS — BP 124/68 | HR 44 | Ht 63.0 in | Wt 176.8 lb

## 2014-05-01 DIAGNOSIS — I5032 Chronic diastolic (congestive) heart failure: Secondary | ICD-10-CM | POA: Diagnosis not present

## 2014-05-01 DIAGNOSIS — I2510 Atherosclerotic heart disease of native coronary artery without angina pectoris: Secondary | ICD-10-CM | POA: Diagnosis not present

## 2014-05-01 DIAGNOSIS — J449 Chronic obstructive pulmonary disease, unspecified: Secondary | ICD-10-CM | POA: Diagnosis not present

## 2014-05-01 DIAGNOSIS — I252 Old myocardial infarction: Secondary | ICD-10-CM | POA: Diagnosis not present

## 2014-05-01 DIAGNOSIS — I5031 Acute diastolic (congestive) heart failure: Secondary | ICD-10-CM | POA: Diagnosis not present

## 2014-05-01 DIAGNOSIS — I10 Essential (primary) hypertension: Secondary | ICD-10-CM | POA: Diagnosis not present

## 2014-05-01 DIAGNOSIS — F0390 Unspecified dementia without behavioral disturbance: Secondary | ICD-10-CM | POA: Diagnosis not present

## 2014-05-01 NOTE — Progress Notes (Signed)
Cardiology Office Note   Date:  05/01/2014   ID:  [name], DOB April 20, 1935, MRN [id number]  PCP:  Lilyan Punt, MD  Cardiologist:  Tonny Bollman, MD    No chief complaint on file.   History of Present Illness: [name] is a 79 y.o. female who presents for Follow-up of CAD and diastolic heart failure. She has coronary artery disease status post PCI of the LAD in 2011. She underwent cardiac catheterization in 2014 demonstrating patency of her stent site in the LAD. There was no other evidence of obstructive disease noted. Ventriculography was suggestive of apical hypertrophy. A cardiac MRI suggests a variant of hypertrophic cardiomyopathy as well. The patient is now O2 dependent.  The patient was hospitalized in February 25 through March 2 with acute on chronic respiratory failure. This was felt to be multi-factorial, but there was clearly a component of diastolic heart failure. She briefly required BiPAP. She was diuresed with clinical improvement. The patient is on home oxygen for chronic respiratory failure and COPD.  She had been noncompliant with medical therapy and her diet. She is doing much better with this since discharge from the hospital. She is weighing herself every day. Her furosemide was recently reduced from 80 mg to 60 mg daily because of renal dysfunction. The patient denies chest pain. She has fatigue and shortness of breath, but feels much better than she did previously. No cough or other complaints today.  Past Medical History  Diagnosis Date  . CAD (coronary artery disease) 08/2009    s/p PCI of the LAD  . Myocardial infarction     Non-st segment elevated  . Hypertension   . Hyperlipidemia   . Bradycardia   . Carotid artery stenosis   . COPD (chronic obstructive pulmonary disease)   . Chronic headache   . Asthma   . Pneumonia   . Ulnar neuropathy   . Seasonal allergies   . Impaired fasting glucose   . Dementia     patient denies this  .  Osteoporosis 2009  . Acute on chronic diastolic heart failure 04/11/2014    Past Surgical History  Procedure Laterality Date  . Partial hysterectomy  1984  . Radial artery catheter      Bladder  . Bladder surgery  1991  . Cardiac catheterization  11/2010    negative  . Left heart catheterization with coronary angiogram N/A 11/05/2012    Procedure: LEFT HEART CATHETERIZATION WITH CORONARY ANGIOGRAM;  Surgeon: Micheline Chapman, MD;  Location: [facility] CATH LAB;  Service: Cardiovascular;  Laterality: N/A;    Current Outpatient Prescriptions  Medication Sig Dispense Refill  . albuterol (PROVENTIL HFA;VENTOLIN HFA) 108 (90 BASE) MCG/ACT inhaler Inhale 2 puffs into the lungs 3 (three) times daily. 1 Inhaler 3  . albuterol (PROVENTIL) (2.5 MG/3ML) 0.083% nebulizer solution Take 3 mLs (2.5 mg total) by nebulization every 4 (four) hours as needed for wheezing or shortness of breath. 75 mL 2  . alendronate (FOSAMAX) 70 MG tablet Take 70 mg by mouth every 7 (seven) days. Take with a full glass of water on an empty stomach.    . Alpha-D-Galactosidase (BEANO) TABS Take 2 tablets by mouth 3 times/day as needed-between meals & bedtime.     Marland Kitchen amLODipine (NORVASC) 5 MG tablet TAKE 1 TABLET BY MOUTH EVERY MORNING 90 tablet 1  . aspirin 81 MG EC tablet Take 81 mg by mouth every morning.     . budesonide-formoterol (SYMBICORT) 160-4.5 MCG/ACT inhaler Inhale 2  puffs into the lungs 2 (two) times daily. 1 Inhaler 12  . calcium gluconate 500 MG tablet Take 1 tablet by mouth daily.    . Cholecalciferol (VITAMIN D3) 1000 UNITS CAPS Take 1 capsule by mouth daily.    . citalopram (CELEXA) 40 MG tablet TAKE 1 TABLET BY MOUTH EVERY MORNING 90 tablet 0  . donepezil (ARICEPT) 10 MG tablet Take 1 tablet (10 mg total) by mouth at bedtime. 90 tablet 0  . erythromycin ophthalmic ointment USE IN THE EYE AS DIRECTED 3.5 g 0  . furosemide (LASIX) 40 MG tablet Take 1.5 tablets (60 mg total) by mouth daily. 45 tablet 2  . ibuprofen  (ADVIL,MOTRIN) 200 MG tablet Take 200 mg by mouth every 6 (six) hours as needed for headache, mild pain or moderate pain.    Marland Kitchen LORazepam (ATIVAN) 1 MG tablet Take 1 mg by mouth at bedtime.    Marland Kitchen losartan (COZAAR) 100 MG tablet TAKE 1 TABLET BY MOUTH DAILY (Patient taking differently: TAKE ONE-HALF TABLET BY MOUTH DAILY) 90 tablet 1  . metoprolol tartrate (LOPRESSOR) 25 MG tablet Take 0.5 tablets (12.5 mg total) by mouth 2 (two) times daily. 180 tablet 3  . nitroGLYCERIN (NITROSTAT) 0.4 MG SL tablet Place 1 tablet (0.4 mg total) under the tongue as needed for chest pain. 25 tablet 11  . polyethylene glycol (MIRALAX / GLYCOLAX) packet Take 17 g by mouth daily as needed for moderate constipation.    . pravastatin (PRAVACHOL) 40 MG tablet TAKE 1 TABLET BY MOUTH EVERY DAY 90 tablet 1   No current facility-administered medications for this visit.    Allergies:   Acetaminophen; Codeine; Dilaudid; Erythromycin; and Sulfonamide derivatives   Social History:  The patient  reports that she quit smoking about 6 years ago. She has never used smokeless tobacco. She reports that she does not drink alcohol or use illicit drugs.   Family History:  The patient's  family history includes Addison's disease in her mother; Alcohol abuse in her father.    ROS:  Please see the history of present illness.  Otherwise, review of systems is positive for chills, orthopnea, exertional dyspnea, easy bruising, excessive sweating, fatigue, wheezing, balance problems, and headaches.  All other systems are reviewed and negative.    PHYSICAL EXAM: VS:  BP 124/68 mmHg  Pulse 44  Ht  (1.6 m)  Wt 176 lb 12.8 oz (80.196 kg)  BMI 31.33 kg/m2  SpO2 98% , BMI Body mass index is 31.33 kg/(m^2). GEN: Well nourished, well developed,elderly woman in no acute distress HEENT: normal Neck: no JVD, no masses. No carotid bruits Cardiac: RRR without murmur or gallop                Respiratory:  clear to auscultation bilaterally,  normal work of breathing GI: soft, nontender, nondistended, + BS MS: no deformity or atrophy Ext: no pretibial edema Skin: warm and dry, no rash Neuro:  Strength and sensation are intact Psych: euthymic mood, full affect  EKG:  EKG is not ordered today.  Recent Labs: 05/07/2013: ALT 10 05/22/2013: Pro B Natriuretic peptide (BNP) 761.0* 12/06/2013: TSH 3.086 04/10/2014: B Natriuretic Peptide 989.0*; Hemoglobin 10.8*; Platelets 197 04/15/2014: Magnesium 2.3 04/25/2014: BUN 22; Creatinine 1.76*; Potassium 4.5; Sodium 140   Lipid Panel     Component Value Date/Time   CHOL 133 12/06/2013 0728   TRIG 153* 12/06/2013 0728   HDL 33* 12/06/2013 0728   CHOLHDL 4.0 12/06/2013 0728   VLDL 31 12/06/2013 0728  LDLCALC 69 12/06/2013 0728      Wt Readings from Last 3 Encounters:  05/01/14 176 lb 12.8 oz (80.196 kg)  04/25/14 174 lb (78.926 kg)  04/16/14 173 lb 1 oz (78.5 kg)     Cardiac Studies Reviewed: 2D echocardiogram that your 26 2016: Study Conclusions  - Left ventricle: The cavity size was normal. Wall thickness was increased in a pattern of mild LVH. Systolic function was vigorous. The estimated ejection fraction was in the range of 65% to 70%. Wall motion was normal; there were no regional wall motion abnormalities. Features are consistent with a pseudonormal left ventricular filling pattern, with concomitant abnormal relaxation and increased filling pressure (grade 2 diastolic dysfunction). - Aortic valve: Mildly calcified annulus. Trileaflet; mildly thickened leaflets. There was trivial regurgitation. Valve area (VTI): 1.83 cm^2. Valve area (Vmax): 1.74 cm^2. Valve area (Vmean): 2.02 cm^2. - Left atrium: The atrium was moderately dilated. - Right atrium: The atrium was mildly dilated. - Pericardium, extracardiac: There is a large left pleural effusion. There was no pericardial effusion. - Technically difficult study.  ASSESSMENT AND PLAN: 1.   Acute on chronic diastolic heart failure. The patient's echocardiogram was reviewed. She has New York Heart Association functional class III symptoms. Difficult to determine how much of her limitation relates to cardiac versus pulmonary disease. There seems to be a component of both. The patient has developed stage III chronic kidney disease and diuretic dosage has been reduced because of worsening renal function. She will stay on her same dose of furosemide, but if her weight increases by 3 pounds I think she should increase back to 60 mg twice daily for 48 hours then to 80 mg daily as her maintenance dose. She has follow-up labs scheduled in a few weeks with Dr. Gerda Diss.  2. Coronary artery disease, native vessel: Stable without symptoms of angina.  3. Hypertension: Continues on amlodipine, furosemide, losartan, and metoprolol  4. Stage III chronic kidney disease: As outlined above. Continue losartan. Follow-up labs per patient and 2-3 weeks.   Current medicines are reviewed with the patient today.  The patient does not have concerns regarding medicines.  The following changes have been made:  no change  Labs/ tests ordered today include:  No orders of the defined types were placed in this encounter.    Disposition:   FU 3 months with PA/NP, 6 months with me.  Signed, Tonny Bollman, MD  05/01/2014 6:10 PM    [facility] [address] [place], [place], [place]  [address] Phone: [phone]; Fax: [phone]

## 2014-05-01 NOTE — Patient Instructions (Signed)
Your physician recommends that you continue on your current medications as directed. Please refer to the Current Medication list given to you today.  Please continue to weigh on a daily basis.  If your weight increases by 3 pounds from baseline please take Furosemide 60mg  twice a day for 2 days and then increase daily dose of Furosemide back to 80mg  a day  Your physician recommends that you schedule a follow-up appointment in: 3 MONTHS with PA or NP  Your physician wants you to follow-up in: 6 MONTHS with Dr Excell Seltzer.  You will receive a reminder letter in the mail two months in advance. If you don't receive a letter, please call our office to schedule the follow-up appointment.

## 2014-05-02 ENCOUNTER — Other Ambulatory Visit (HOSPITAL_COMMUNITY): Payer: Medicare Other

## 2014-05-09 ENCOUNTER — Telehealth: Payer: Self-pay | Admitting: *Deleted

## 2014-05-09 DIAGNOSIS — I10 Essential (primary) hypertension: Secondary | ICD-10-CM | POA: Diagnosis not present

## 2014-05-09 DIAGNOSIS — F0390 Unspecified dementia without behavioral disturbance: Secondary | ICD-10-CM | POA: Diagnosis not present

## 2014-05-09 DIAGNOSIS — I5031 Acute diastolic (congestive) heart failure: Secondary | ICD-10-CM | POA: Diagnosis not present

## 2014-05-09 DIAGNOSIS — I2510 Atherosclerotic heart disease of native coronary artery without angina pectoris: Secondary | ICD-10-CM | POA: Diagnosis not present

## 2014-05-09 DIAGNOSIS — J449 Chronic obstructive pulmonary disease, unspecified: Secondary | ICD-10-CM | POA: Diagnosis not present

## 2014-05-09 DIAGNOSIS — I252 Old myocardial infarction: Secondary | ICD-10-CM | POA: Diagnosis not present

## 2014-05-09 NOTE — Telephone Encounter (Signed)
Pt's weight has increased steadily since the Lasix has been decreased. Her weight today is 178 lbs. It was 174 lbs. On her OV on 3/11. She also c/o swelling in her abdomen and slight SOB.  Per Dr. Lorin Picket, go back to original Lasix dose of 80 mg daily.   Nurse verbalized understanding.

## 2014-05-12 ENCOUNTER — Telehealth: Payer: Self-pay | Admitting: Family Medicine

## 2014-05-12 DIAGNOSIS — R609 Edema, unspecified: Secondary | ICD-10-CM

## 2014-05-12 MED ORDER — FUROSEMIDE 40 MG PO TABS: ORAL_TABLET | ORAL | Status: DC

## 2014-05-12 NOTE — Telephone Encounter (Signed)
Pt on 80mg  of lasix, her weight is up to 182lbs short of breath now What do you recommend?   Please advise Misty Stanley at [phone]  She states that if you just want to increase her lasix for like  The next three days to leave a message on her phone if she don't answer

## 2014-05-12 NOTE — Telephone Encounter (Signed)
Pt has SOB with activity, no pitting edema, and is still eating a high sodium diet. Per Dr. Nicki Reaper, Met 7 and BNP on Friday, and increase Lasix to 80 mg in am and 40 mg in pm. F/U with him either Monday or Tuesday next week. Stacy notified and verbalized understanding. Will call back to schedule appt.

## 2014-05-12 NOTE — Progress Notes (Signed)
Patient's daughter notified and verbalized understanding of the test results. No further questions. (Lasix increased d/t swelling per Dr. Lorin Picket)

## 2014-05-14 ENCOUNTER — Other Ambulatory Visit: Payer: Self-pay | Admitting: Family Medicine

## 2014-05-14 ENCOUNTER — Encounter: Payer: Self-pay | Admitting: *Deleted

## 2014-05-16 ENCOUNTER — Encounter: Payer: Self-pay | Admitting: *Deleted

## 2014-05-16 DIAGNOSIS — R0600 Dyspnea, unspecified: Secondary | ICD-10-CM | POA: Diagnosis not present

## 2014-05-19 ENCOUNTER — Encounter: Payer: Self-pay | Admitting: Family Medicine

## 2014-05-19 ENCOUNTER — Ambulatory Visit (INDEPENDENT_AMBULATORY_CARE_PROVIDER_SITE_OTHER): Payer: Medicare Other | Admitting: Family Medicine

## 2014-05-19 VITALS — BP 94/70 | Ht 63.0 in | Wt 180.0 lb

## 2014-05-19 DIAGNOSIS — I951 Orthostatic hypotension: Secondary | ICD-10-CM

## 2014-05-19 DIAGNOSIS — R609 Edema, unspecified: Secondary | ICD-10-CM | POA: Diagnosis not present

## 2014-05-19 DIAGNOSIS — R748 Abnormal levels of other serum enzymes: Secondary | ICD-10-CM | POA: Diagnosis not present

## 2014-05-19 DIAGNOSIS — R7989 Other specified abnormal findings of blood chemistry: Secondary | ICD-10-CM

## 2014-05-19 DIAGNOSIS — J449 Chronic obstructive pulmonary disease, unspecified: Secondary | ICD-10-CM | POA: Diagnosis not present

## 2014-05-19 MED ORDER — AMLODIPINE BESYLATE 5 MG PO TABS: ORAL_TABLET | ORAL | Status: DC

## 2014-05-19 NOTE — Progress Notes (Signed)
   Subjective:    Patient ID: ATENEA BOHMER, female    DOB: Jul 14, 1935, 79 y.o.   MRN: [id number]  HPI Patient is here today for a check up on her fluid rentention.  She said she originally went to hospital back in Feb for this.  Pt says she has been gaining weight.  Pt's lasix increased.  Got BW done on Friday, but I do not see the results.  Pt says she feels tired, weak, and off balance.  Patient states she feels low bit weak when she stands up but otherwise her breathing is doing good and is at baseline  Review of Systems     Objective:   Physical Exam  Lungs clear heart trigger pulse normal extremities minimal edema at the ankles the daughter relates that she had significant edema earlier last week but when we switch the Lasix to a higher dose this went down dramatically     blood pressure was checked sitting and standing and she had a significant drop of blood pressure standing Assessment & Plan:  Underlying COPD with oxygen dependency History of diastolic dysfunction but echo shows ejection fraction 65% Elevated BNP probably related to COPD more so than CHF Creatinine actually better compared where it was Will plan on repeating metabolic 7 in about 1 month's time Recommended patient follow-up with [name] in 4 weeks Daily weights each morning if noticing a significant change let us know Encourage healthy eating limiting portions avoiding excessive salt Relative orthostatic hypotension reduce amlodipine from 5 mg down to 2.5 mg.

## 2014-05-19 NOTE — Patient Instructions (Signed)
Reduce amlodipine from 5 mg to 2.5mg  ( that means reduce from a whole tablet to half a tablet)  Recheck in 4 to 5 weeks

## 2014-05-23 ENCOUNTER — Ambulatory Visit: Payer: Medicare Other | Admitting: Family Medicine

## 2014-05-26 ENCOUNTER — Other Ambulatory Visit: Payer: Self-pay | Admitting: Cardiovascular Disease

## 2014-05-26 DIAGNOSIS — I2510 Atherosclerotic heart disease of native coronary artery without angina pectoris: Secondary | ICD-10-CM | POA: Diagnosis not present

## 2014-05-26 DIAGNOSIS — J449 Chronic obstructive pulmonary disease, unspecified: Secondary | ICD-10-CM | POA: Diagnosis not present

## 2014-05-26 DIAGNOSIS — I10 Essential (primary) hypertension: Secondary | ICD-10-CM | POA: Diagnosis not present

## 2014-05-26 DIAGNOSIS — I5031 Acute diastolic (congestive) heart failure: Secondary | ICD-10-CM | POA: Diagnosis not present

## 2014-05-26 DIAGNOSIS — I252 Old myocardial infarction: Secondary | ICD-10-CM | POA: Diagnosis not present

## 2014-05-26 DIAGNOSIS — F0390 Unspecified dementia without behavioral disturbance: Secondary | ICD-10-CM | POA: Diagnosis not present

## 2014-05-28 DIAGNOSIS — I10 Essential (primary) hypertension: Secondary | ICD-10-CM | POA: Diagnosis not present

## 2014-05-28 DIAGNOSIS — I2510 Atherosclerotic heart disease of native coronary artery without angina pectoris: Secondary | ICD-10-CM | POA: Diagnosis not present

## 2014-05-28 DIAGNOSIS — I5031 Acute diastolic (congestive) heart failure: Secondary | ICD-10-CM | POA: Diagnosis not present

## 2014-05-28 DIAGNOSIS — J449 Chronic obstructive pulmonary disease, unspecified: Secondary | ICD-10-CM | POA: Diagnosis not present

## 2014-05-29 ENCOUNTER — Other Ambulatory Visit: Payer: Self-pay | Admitting: Family Medicine

## 2014-05-29 NOTE — Telephone Encounter (Signed)
This +4 refills 

## 2014-06-04 DIAGNOSIS — I10 Essential (primary) hypertension: Secondary | ICD-10-CM | POA: Diagnosis not present

## 2014-06-04 DIAGNOSIS — I2510 Atherosclerotic heart disease of native coronary artery without angina pectoris: Secondary | ICD-10-CM | POA: Diagnosis not present

## 2014-06-04 DIAGNOSIS — I252 Old myocardial infarction: Secondary | ICD-10-CM | POA: Diagnosis not present

## 2014-06-04 DIAGNOSIS — J449 Chronic obstructive pulmonary disease, unspecified: Secondary | ICD-10-CM | POA: Diagnosis not present

## 2014-06-04 DIAGNOSIS — F0390 Unspecified dementia without behavioral disturbance: Secondary | ICD-10-CM | POA: Diagnosis not present

## 2014-06-04 DIAGNOSIS — I5031 Acute diastolic (congestive) heart failure: Secondary | ICD-10-CM | POA: Diagnosis not present

## 2014-06-07 ENCOUNTER — Encounter (HOSPITAL_COMMUNITY): Payer: Self-pay

## 2014-06-07 ENCOUNTER — Emergency Department (HOSPITAL_COMMUNITY)
Admission: EM | Admit: 2014-06-07 | Discharge: 2014-06-07 | Disposition: A | Discharge status: Home / Self Care | Payer: Medicare Other | Attending: Emergency Medicine | Admitting: Emergency Medicine

## 2014-06-07 ENCOUNTER — Emergency Department (HOSPITAL_COMMUNITY): Payer: Medicare Other

## 2014-06-07 DIAGNOSIS — E785 Hyperlipidemia, unspecified: Secondary | ICD-10-CM | POA: Insufficient documentation

## 2014-06-07 DIAGNOSIS — J90 Pleural effusion, not elsewhere classified: Secondary | ICD-10-CM | POA: Diagnosis not present

## 2014-06-07 DIAGNOSIS — Z79899 Other long term (current) drug therapy: Secondary | ICD-10-CM | POA: Insufficient documentation

## 2014-06-07 DIAGNOSIS — Z7982 Long term (current) use of aspirin: Secondary | ICD-10-CM | POA: Insufficient documentation

## 2014-06-07 DIAGNOSIS — Z87891 Personal history of nicotine dependence: Secondary | ICD-10-CM | POA: Diagnosis not present

## 2014-06-07 DIAGNOSIS — Z9889 Other specified postprocedural states: Secondary | ICD-10-CM | POA: Insufficient documentation

## 2014-06-07 DIAGNOSIS — Z8701 Personal history of pneumonia (recurrent): Secondary | ICD-10-CM | POA: Diagnosis not present

## 2014-06-07 DIAGNOSIS — Z8669 Personal history of other diseases of the nervous system and sense organs: Secondary | ICD-10-CM | POA: Diagnosis not present

## 2014-06-07 DIAGNOSIS — I252 Old myocardial infarction: Secondary | ICD-10-CM | POA: Insufficient documentation

## 2014-06-07 DIAGNOSIS — I10 Essential (primary) hypertension: Secondary | ICD-10-CM | POA: Insufficient documentation

## 2014-06-07 DIAGNOSIS — Z7951 Long term (current) use of inhaled steroids: Secondary | ICD-10-CM | POA: Insufficient documentation

## 2014-06-07 DIAGNOSIS — M810 Age-related osteoporosis without current pathological fracture: Secondary | ICD-10-CM | POA: Insufficient documentation

## 2014-06-07 DIAGNOSIS — F0390 Unspecified dementia without behavioral disturbance: Secondary | ICD-10-CM | POA: Insufficient documentation

## 2014-06-07 DIAGNOSIS — I517 Cardiomegaly: Secondary | ICD-10-CM | POA: Diagnosis not present

## 2014-06-07 DIAGNOSIS — I5033 Acute on chronic diastolic (congestive) heart failure: Secondary | ICD-10-CM | POA: Insufficient documentation

## 2014-06-07 DIAGNOSIS — J441 Chronic obstructive pulmonary disease with (acute) exacerbation: Secondary | ICD-10-CM | POA: Insufficient documentation

## 2014-06-07 DIAGNOSIS — R0602 Shortness of breath: Secondary | ICD-10-CM

## 2014-06-07 DIAGNOSIS — R918 Other nonspecific abnormal finding of lung field: Secondary | ICD-10-CM | POA: Diagnosis not present

## 2014-06-07 DIAGNOSIS — I2510 Atherosclerotic heart disease of native coronary artery without angina pectoris: Secondary | ICD-10-CM | POA: Diagnosis not present

## 2014-06-07 LAB — CBC WITH DIFFERENTIAL/PLATELET
Basophils Absolute: 0 10*3/uL (ref 0.0–0.1)
Basophils Relative: 1 % (ref 0–1)
EOS ABS: 0.1 10*3/uL (ref 0.0–0.7)
Eosinophils Relative: 2 % (ref 0–5)
HEMATOCRIT: 30.4 % — AB (ref 36.0–46.0)
HEMOGLOBIN: 10.1 g/dL — AB (ref 12.0–15.0)
Lymphocytes Relative: 16 % (ref 12–46)
Lymphs Abs: 1 10*3/uL (ref 0.7–4.0)
MCH: 28.6 pg (ref 26.0–34.0)
MCHC: 33.2 g/dL (ref 30.0–36.0)
MCV: 86.1 fL (ref 78.0–100.0)
MONO ABS: 0.4 10*3/uL (ref 0.1–1.0)
MONOS PCT: 6 % (ref 3–12)
NEUTROS ABS: 4.6 10*3/uL (ref 1.7–7.7)
NEUTROS PCT: 75 % (ref 43–77)
Platelets: 133 10*3/uL — ABNORMAL LOW (ref 150–400)
RBC: 3.53 MIL/uL — ABNORMAL LOW (ref 3.87–5.11)
RDW: 15.4 % (ref 11.5–15.5)
WBC: 6.1 10*3/uL (ref 4.0–10.5)

## 2014-06-07 LAB — COMPREHENSIVE METABOLIC PANEL
ALT: 9 U/L (ref 0–35)
AST: 15 U/L (ref 0–37)
Albumin: 3.8 g/dL (ref 3.5–5.2)
Alkaline Phosphatase: 46 U/L (ref 39–117)
Anion gap: 10 (ref 5–15)
BILIRUBIN TOTAL: 0.4 mg/dL (ref 0.3–1.2)
BUN: 19 mg/dL (ref 6–23)
CHLORIDE: 103 mmol/L (ref 96–112)
CO2: 29 mmol/L (ref 19–32)
CREATININE: 1.47 mg/dL — AB (ref 0.50–1.10)
Calcium: 7.5 mg/dL — ABNORMAL LOW (ref 8.4–10.5)
GFR, EST AFRICAN AMERICAN: 39 mL/min — AB (ref >90)
GFR, EST NON AFRICAN AMERICAN: 33 mL/min — AB (ref >90)
GLUCOSE: 106 mg/dL — AB (ref 70–99)
Potassium: 3.2 mmol/L — ABNORMAL LOW (ref 3.5–5.1)
Sodium: 142 mmol/L (ref 135–145)
Total Protein: 6.6 g/dL (ref 6.0–8.3)

## 2014-06-07 LAB — TROPONIN I: Troponin I: 0.03 ng/mL (ref <0.031)

## 2014-06-07 LAB — BRAIN NATRIURETIC PEPTIDE: B NATRIURETIC PEPTIDE 5: 582 pg/mL — AB (ref 0.0–100.0)

## 2014-06-07 MED ORDER — METHYLPREDNISOLONE SODIUM SUCC 125 MG IJ SOLR
125.0000 mg | Freq: Once | INTRAMUSCULAR | Status: AC
  Administered 2014-06-07: 125 mg via INTRAMUSCULAR
  Filled 2014-06-07: qty 2

## 2014-06-07 MED ORDER — PREDNISONE 20 MG PO TABS: ORAL_TABLET | ORAL | Status: DC

## 2014-06-07 MED ORDER — ALBUTEROL SULFATE (2.5 MG/3ML) 0.083% IN NEBU
5.0000 mg | INHALATION_SOLUTION | Freq: Once | RESPIRATORY_TRACT | Status: AC
  Administered 2014-06-07: 5 mg via RESPIRATORY_TRACT
  Filled 2014-06-07: qty 6

## 2014-06-07 MED ORDER — LEVOFLOXACIN 500 MG PO TABS
500.0000 mg | ORAL_TABLET | Freq: Once | ORAL | Status: AC
  Administered 2014-06-07: 500 mg via ORAL
  Filled 2014-06-07: qty 1

## 2014-06-07 MED ORDER — POTASSIUM CHLORIDE CRYS ER 20 MEQ PO TBCR
40.0000 meq | EXTENDED_RELEASE_TABLET | Freq: Once | ORAL | Status: AC
  Administered 2014-06-07: 40 meq via ORAL
  Filled 2014-06-07: qty 2

## 2014-06-07 MED ORDER — LEVOFLOXACIN 500 MG PO TABS: 500.0000 mg | ORAL_TABLET | Freq: Every day | ORAL | Status: DC

## 2014-06-07 MED ORDER — ALBUTEROL (5 MG/ML) CONTINUOUS INHALATION SOLN
10.0000 mg/h | INHALATION_SOLUTION | RESPIRATORY_TRACT | Status: DC
  Administered 2014-06-07: 10 mg/h via RESPIRATORY_TRACT
  Filled 2014-06-07: qty 20

## 2014-06-07 NOTE — Discharge Instructions (Signed)

## 2014-06-07 NOTE — ED Provider Notes (Signed)
CSN: [id number]     Arrival date & time 06/07/14  0256 History   First MD Initiated Contact with Patient 06/07/14 0304     Chief Complaint  Patient presents with  . Shortness of Breath     (Consider location/radiation/quality/duration/timing/severity/associated sxs/prior Treatment) HPI Patient has a history of COPD and is on home oxygen of 2-3 L. States that she's had a "hoarse throat" all day. She states she was lying on the couch this evening and woke with increased difficulty breathing. States she was wheezing. Endorses a cough productive of white sputum. Denies any chest pain. Shortness of breath is improved currently. No lower extremity swelling. No fever or chills Past Medical History  Diagnosis Date  . CAD (coronary artery disease) 08/2009    s/p PCI of the LAD  . Myocardial infarction     Non-st segment elevated  . Hypertension   . Hyperlipidemia   . Bradycardia   . Carotid artery stenosis   . COPD (chronic obstructive pulmonary disease)   . Chronic headache   . Asthma   . Pneumonia   . Ulnar neuropathy   . Seasonal allergies   . Impaired fasting glucose   . Dementia     patient denies this  . Osteoporosis 2009  . Acute on chronic diastolic heart failure 04/11/2014   Past Surgical History  Procedure Laterality Date  . Partial hysterectomy  1984  . Radial artery catheter      Bladder  . Bladder surgery  1991  . Cardiac catheterization  11/2010    negative  . Left heart catheterization with coronary angiogram N/A 11/05/2012    Procedure: LEFT HEART CATHETERIZATION WITH CORONARY ANGIOGRAM;  Surgeon: Micheline Chapman, MD;  Location: [facility] CATH LAB;  Service: Cardiovascular;  Laterality: N/A;   Family History  Problem Relation Age of Onset  . Addison's disease Mother   . Alcohol abuse Father    History  Substance Use Topics  . Smoking status: Former Smoker -- 1.00 packs/day for 35 years    Quit date: 02/15/2008  . Smokeless tobacco: Never Used  . Alcohol Use: No    OB History    No data available     Review of Systems  Constitutional: Negative for fever and chills.  HENT: Positive for voice change. Negative for congestion, sinus pressure and sore throat.   Respiratory: Positive for cough, shortness of breath and wheezing. Negative for chest tightness.   Cardiovascular: Negative for chest pain, palpitations and leg swelling.  Gastrointestinal: Negative for nausea, vomiting and abdominal pain.  Musculoskeletal: Negative for back pain, neck pain and neck stiffness.  Skin: Negative for rash and wound.  Neurological: Negative for dizziness, weakness, light-headedness, numbness and headaches.  All other systems reviewed and are negative.     Allergies  Acetaminophen; Codeine; Dilaudid; Erythromycin; and Sulfonamide derivatives  Home Medications   Prior to Admission medications   Medication Sig Start Date End Date Taking? Authorizing Provider  albuterol (PROVENTIL HFA;VENTOLIN HFA) 108 (90 BASE) MCG/ACT inhaler Inhale 2 puffs into the lungs 3 (three) times daily. 04/16/14  Yes Elliot Cousin, MD  albuterol (PROVENTIL) (2.5 MG/3ML) 0.083% nebulizer solution Take 3 mLs (2.5 mg total) by nebulization every 4 (four) hours as needed for wheezing or shortness of breath. 04/18/14  Yes Babs Sciara, MD  alendronate (FOSAMAX) 70 MG tablet Take 70 mg by mouth every 7 (seven) days. Take with a full glass of water on an empty stomach.   Yes Historical Provider, MD  alendronate (FOSAMAX) 70 MG tablet TAKE 1 TABLET BY MOUTH ONCE WEEKLY AS DIRECTED 05/14/14  Yes Merlyn Albert, MD  Alpha-D-Galactosidase [facility]) TABS Take 2 tablets by mouth 3 times/day as needed-between meals & bedtime.    Yes Historical Provider, MD  amLODipine (NORVASC) 5 MG tablet 1/2 tablet daily 05/19/14  Yes Babs Sciara, MD  aspirin 81 MG EC tablet Take 81 mg by mouth every morning.    Yes Historical Provider, MD  budesonide-formoterol (SYMBICORT) 160-4.5 MCG/ACT inhaler Inhale 2 puffs into  the lungs 2 (two) times daily. 08/28/13  Yes Babs Sciara, MD  calcium gluconate 500 MG tablet Take 1 tablet by mouth daily.   Yes Historical Provider, MD  Cholecalciferol (VITAMIN D3) 1000 UNITS CAPS Take 1 capsule by mouth daily.   Yes Historical Provider, MD  citalopram (CELEXA) 40 MG tablet TAKE 1 TABLET BY MOUTH EVERY MORNING 01/08/14  Yes Babs Sciara, MD  donepezil (ARICEPT) 10 MG tablet Take 1 tablet (10 mg total) by mouth at bedtime. 03/25/14  Yes Babs Sciara, MD  erythromycin ophthalmic ointment USE IN THE EYE AS DIRECTED 01/13/14  Yes Babs Sciara, MD  furosemide (LASIX) 40 MG tablet Take 2 tablets in the morning and 1 tablet in the evening 05/12/14  Yes Babs Sciara, MD  ibuprofen (ADVIL,MOTRIN) 200 MG tablet Take 200 mg by mouth every 6 (six) hours as needed for headache, mild pain or moderate pain.   Yes Historical Provider, MD  LORazepam (ATIVAN) 1 MG tablet TAKE ONE-HALF TO ONE WHOLE TABLET BY MOUTH EVERY NIGHT AT BEDTIME AS NEEDED 05/29/14  Yes Babs Sciara, MD  losartan (COZAAR) 100 MG tablet TAKE 1 TABLET BY MOUTH DAILY Patient taking differently: TAKE ONE-HALF TABLET BY MOUTH DAILY 11/25/13  Yes Babs Sciara, MD  metoprolol tartrate (LOPRESSOR) 25 MG tablet Take 0.5 tablets (12.5 mg total) by mouth 2 (two) times daily. 05/22/13  Yes Rosalio Macadamia, NP  metoprolol tartrate (LOPRESSOR) 25 MG tablet TAKE 1/2 TABLET BY MOUTH TWICE DAILY 05/26/14  Yes Tonny Bollman, MD  nitroGLYCERIN (NITROSTAT) 0.4 MG SL tablet Place 1 tablet (0.4 mg total) under the tongue as needed for chest pain. 07/10/13  Yes Rosalio Macadamia, NP  polyethylene glycol (MIRALAX / GLYCOLAX) packet Take 17 g by mouth daily as needed for moderate constipation. 04/16/14  Yes Elliot Cousin, MD  pravastatin (PRAVACHOL) 40 MG tablet TAKE 1 TABLET BY MOUTH EVERY DAY 01/07/14  Yes Babs Sciara, MD   BP 137/47 mmHg  Pulse 83  Temp(Src) 98.6 F (37 C) (Oral)  Resp 16  Ht  (1.6 m)  Wt 175 lb (79.379 kg)  BMI  31.01 kg/m2  SpO2 100% Physical Exam  Constitutional: She is oriented to person, place, and time. She appears well-developed and well-nourished. No distress.  HENT:  Head: Normocephalic and atraumatic.  Mouth/Throat: Oropharynx is clear and moist. No oropharyngeal exudate.  Eyes: EOM are normal. Pupils are equal, round, and reactive to light.  Neck: Normal range of motion. Neck supple.  Cardiovascular: Normal rate and regular rhythm.  Exam reveals no gallop and no friction rub.   No murmur heard. Pulmonary/Chest: Effort normal. No stridor. No respiratory distress. She has wheezes (diffuse wheezing throughout). She has no rales. She exhibits no tenderness.  Abdominal: Soft. Bowel sounds are normal. She exhibits no distension and no mass. There is no tenderness. There is no rebound and no guarding.  Musculoskeletal: Normal range of motion. She exhibits  no edema or tenderness.  No lower extremity swelling or pain.  Neurological: She is alert and oriented to person, place, and time.  5/5 motor in all extremities. Sensation is fully intact.  Skin: Skin is warm and dry. No rash noted. No erythema.  Psychiatric: She has a normal mood and affect. Her behavior is normal.  Nursing note and vitals reviewed.   ED Course  Procedures (including critical care time) Labs Review Labs Reviewed  CBC WITH DIFFERENTIAL/PLATELET - Abnormal; Notable for the following:    RBC 3.53 (*)    Hemoglobin 10.1 (*)    HCT 30.4 (*)    Platelets 133 (*)    All other components within normal limits  COMPREHENSIVE METABOLIC PANEL - Abnormal; Notable for the following:    Potassium 3.2 (*)    Glucose, Bld 106 (*)    Creatinine, Ser 1.47 (*)    Calcium 7.5 (*)    GFR calc non Af Amer 33 (*)    GFR calc Af Amer 39 (*)    All other components within normal limits  BRAIN NATRIURETIC PEPTIDE - Abnormal; Notable for the following:    B Natriuretic Peptide 582.0 (*)    All other components within normal limits   TROPONIN I    Imaging Review Dg Chest Port 1 View  06/07/2014   CLINICAL DATA:  Acute onset of difficulty breathing. Initial encounter.  EXAM: PORTABLE CHEST - 1 VIEW  COMPARISON:  Chest radiograph performed 04/15/2014  FINDINGS: The lungs are well-aerated. A small left pleural effusion is noted. Bibasilar airspace opacity may reflect interstitial edema or pneumonia. There is no evidence of pneumothorax.  The cardiomediastinal silhouette is mildly enlarged. No acute osseous abnormalities are seen.  IMPRESSION: Small left pleural effusion noted. Bibasilar airspace opacity may reflect interstitial edema or pneumonia. Mild cardiomegaly.   Electronically Signed   By: Roanna Raider M.D.   On: 06/07/2014 03:55     EKG Interpretation None      MDM   Final diagnoses:  SOB (shortness of breath)   Patient with improved breathing though still wheezing. Vital signs remain stable. X-ray with bibasilar airspace opacities. Elevation in BNP. Normal white blood cell count. Afebrile. Low suspicion for pneumonia.   Patient states she is feeling much better after second breathing treatment. Asking to be discharged home. Advised to follow-up with her primary physician. She's been given return precautions and is voiced understanding.  Loren Racer, MD 06/07/14 [phone]

## 2014-06-07 NOTE — ED Notes (Signed)
Chronic COPD, on home oxygen, states she awoke feeling like  She "couldn't catch my breath"  Pt denies pain

## 2014-06-09 ENCOUNTER — Ambulatory Visit (INDEPENDENT_AMBULATORY_CARE_PROVIDER_SITE_OTHER): Payer: Medicare Other | Admitting: Family Medicine

## 2014-06-09 ENCOUNTER — Encounter: Payer: Self-pay | Admitting: Family Medicine

## 2014-06-09 VITALS — BP 110/70 | Ht 63.0 in | Wt 177.4 lb

## 2014-06-09 DIAGNOSIS — E876 Hypokalemia: Secondary | ICD-10-CM

## 2014-06-09 NOTE — Patient Instructions (Signed)
Labs the week of the 16th of May  OV week of May 23rd

## 2014-06-09 NOTE — Progress Notes (Signed)
   Subjective:    Patient ID: [name], female    DOB: 1935/02/26, 79 y.o.   MRN: [id number]  HPI Patient is here today for a follow up visit form the ER. Patient was seen at [facility] ER on 06/07/14 for COPD exacerbation. Patient is currently taking Levaquin and Prednisone. Patient states that she is feeling a lot better. Patient has no other concerns at this time.   low calcium low potassium address today with supplementation  Review of Systems  please see ER notes reviewed in detail. Patient currently denie any high fever chills sweats    Objective:   Physical Exam   lungs clear heart regular scattered wheezes are noted not rest or distress neck no masses      Assessment & Plan:   COPD flareup finish out the prednison continue curren measures continuing antibiotics no need for furthertesting currently follow-up within 3 months   Potassium prescribed as well repeat lab work n a few weeks

## 2014-06-10 ENCOUNTER — Other Ambulatory Visit: Payer: Self-pay | Admitting: *Deleted

## 2014-06-10 ENCOUNTER — Encounter: Payer: Self-pay | Admitting: *Deleted

## 2014-06-10 ENCOUNTER — Encounter: Payer: Self-pay | Admitting: Family Medicine

## 2014-06-10 ENCOUNTER — Telehealth: Payer: Self-pay | Admitting: Family Medicine

## 2014-06-10 MED ORDER — POTASSIUM CHLORIDE CRYS ER 10 MEQ PO TBCR: 10.0000 meq | EXTENDED_RELEASE_TABLET | Freq: Two times a day (BID) | ORAL | Status: DC

## 2014-06-10 NOTE — Telephone Encounter (Signed)
Please prescribed potassium 10 mEq 1 every single day, #30, 5 refills, check met 7 in approximately 6 weeks 

## 2014-06-10 NOTE — Telephone Encounter (Signed)
Patient went to [facility] last night to pick up Potassium, but they did not have anything.  Can we send this in to [facility]?  Also, Misty Stanley believes that Tamerah told the nurse yesterday that she was not taking the Vitamin C and she wanted to report that she is taking the Vitamin C.

## 2014-06-10 NOTE — Telephone Encounter (Signed)
Discussed with stacey. Orders were put in at office visit. Potassium sent to pharm. stacey states she is taking calcium 600mg  not vit c. Med list updated.

## 2014-06-10 NOTE — Telephone Encounter (Signed)
Potassium not on med list. Office note states potassium prescribed. Potassium on labs 06/07/14 was 3.2.

## 2014-06-12 DIAGNOSIS — I252 Old myocardial infarction: Secondary | ICD-10-CM | POA: Diagnosis not present

## 2014-06-12 DIAGNOSIS — I2510 Atherosclerotic heart disease of native coronary artery without angina pectoris: Secondary | ICD-10-CM | POA: Diagnosis not present

## 2014-06-12 DIAGNOSIS — F0390 Unspecified dementia without behavioral disturbance: Secondary | ICD-10-CM | POA: Diagnosis not present

## 2014-06-12 DIAGNOSIS — J449 Chronic obstructive pulmonary disease, unspecified: Secondary | ICD-10-CM | POA: Diagnosis not present

## 2014-06-12 DIAGNOSIS — I5031 Acute diastolic (congestive) heart failure: Secondary | ICD-10-CM | POA: Diagnosis not present

## 2014-06-12 DIAGNOSIS — I10 Essential (primary) hypertension: Secondary | ICD-10-CM | POA: Diagnosis not present

## 2014-06-16 ENCOUNTER — Ambulatory Visit: Payer: Medicare Other | Admitting: Family Medicine

## 2014-06-16 DIAGNOSIS — F0390 Unspecified dementia without behavioral disturbance: Secondary | ICD-10-CM | POA: Diagnosis not present

## 2014-06-16 DIAGNOSIS — E785 Hyperlipidemia, unspecified: Secondary | ICD-10-CM | POA: Diagnosis not present

## 2014-06-16 DIAGNOSIS — I5031 Acute diastolic (congestive) heart failure: Secondary | ICD-10-CM | POA: Diagnosis not present

## 2014-06-16 DIAGNOSIS — I2510 Atherosclerotic heart disease of native coronary artery without angina pectoris: Secondary | ICD-10-CM | POA: Diagnosis not present

## 2014-06-16 DIAGNOSIS — J449 Chronic obstructive pulmonary disease, unspecified: Secondary | ICD-10-CM | POA: Diagnosis not present

## 2014-06-16 DIAGNOSIS — I252 Old myocardial infarction: Secondary | ICD-10-CM | POA: Diagnosis not present

## 2014-06-16 DIAGNOSIS — I10 Essential (primary) hypertension: Secondary | ICD-10-CM | POA: Diagnosis not present

## 2014-06-18 DIAGNOSIS — I5031 Acute diastolic (congestive) heart failure: Secondary | ICD-10-CM | POA: Diagnosis not present

## 2014-06-18 DIAGNOSIS — I10 Essential (primary) hypertension: Secondary | ICD-10-CM | POA: Diagnosis not present

## 2014-06-18 DIAGNOSIS — J449 Chronic obstructive pulmonary disease, unspecified: Secondary | ICD-10-CM | POA: Diagnosis not present

## 2014-06-18 DIAGNOSIS — F0390 Unspecified dementia without behavioral disturbance: Secondary | ICD-10-CM | POA: Diagnosis not present

## 2014-06-18 DIAGNOSIS — I252 Old myocardial infarction: Secondary | ICD-10-CM | POA: Diagnosis not present

## 2014-06-18 DIAGNOSIS — I2510 Atherosclerotic heart disease of native coronary artery without angina pectoris: Secondary | ICD-10-CM | POA: Diagnosis not present

## 2014-06-20 DIAGNOSIS — I10 Essential (primary) hypertension: Secondary | ICD-10-CM | POA: Diagnosis not present

## 2014-06-20 DIAGNOSIS — I2510 Atherosclerotic heart disease of native coronary artery without angina pectoris: Secondary | ICD-10-CM | POA: Diagnosis not present

## 2014-06-20 DIAGNOSIS — J449 Chronic obstructive pulmonary disease, unspecified: Secondary | ICD-10-CM | POA: Diagnosis not present

## 2014-06-20 DIAGNOSIS — I5031 Acute diastolic (congestive) heart failure: Secondary | ICD-10-CM | POA: Diagnosis not present

## 2014-06-23 ENCOUNTER — Other Ambulatory Visit: Payer: Self-pay | Admitting: Family Medicine

## 2014-06-26 DIAGNOSIS — I10 Essential (primary) hypertension: Secondary | ICD-10-CM | POA: Diagnosis not present

## 2014-06-26 DIAGNOSIS — I252 Old myocardial infarction: Secondary | ICD-10-CM | POA: Diagnosis not present

## 2014-06-26 DIAGNOSIS — I2510 Atherosclerotic heart disease of native coronary artery without angina pectoris: Secondary | ICD-10-CM | POA: Diagnosis not present

## 2014-06-26 DIAGNOSIS — I5031 Acute diastolic (congestive) heart failure: Secondary | ICD-10-CM | POA: Diagnosis not present

## 2014-06-26 DIAGNOSIS — J449 Chronic obstructive pulmonary disease, unspecified: Secondary | ICD-10-CM | POA: Diagnosis not present

## 2014-06-26 DIAGNOSIS — F0390 Unspecified dementia without behavioral disturbance: Secondary | ICD-10-CM | POA: Diagnosis not present

## 2014-06-30 DIAGNOSIS — E876 Hypokalemia: Secondary | ICD-10-CM | POA: Diagnosis not present

## 2014-07-01 ENCOUNTER — Encounter: Payer: Self-pay | Admitting: Family Medicine

## 2014-07-01 DIAGNOSIS — I5031 Acute diastolic (congestive) heart failure: Secondary | ICD-10-CM | POA: Diagnosis not present

## 2014-07-01 DIAGNOSIS — I252 Old myocardial infarction: Secondary | ICD-10-CM | POA: Diagnosis not present

## 2014-07-01 DIAGNOSIS — I10 Essential (primary) hypertension: Secondary | ICD-10-CM | POA: Diagnosis not present

## 2014-07-01 DIAGNOSIS — F0390 Unspecified dementia without behavioral disturbance: Secondary | ICD-10-CM | POA: Diagnosis not present

## 2014-07-01 DIAGNOSIS — I2510 Atherosclerotic heart disease of native coronary artery without angina pectoris: Secondary | ICD-10-CM | POA: Diagnosis not present

## 2014-07-01 DIAGNOSIS — J449 Chronic obstructive pulmonary disease, unspecified: Secondary | ICD-10-CM | POA: Diagnosis not present

## 2014-07-01 LAB — MAGNESIUM: MAGNESIUM: 2.4 mg/dL — AB (ref 1.6–2.3)

## 2014-07-01 LAB — BASIC METABOLIC PANEL
BUN/Creatinine Ratio: 15 (ref 11–26)
BUN: 26 mg/dL (ref 8–27)
CO2: 28 mmol/L (ref 18–29)
Calcium: 9 mg/dL (ref 8.7–10.3)
Chloride: 97 mmol/L (ref 97–108)
Creatinine, Ser: 1.7 mg/dL — ABNORMAL HIGH (ref 0.57–1.00)
GFR calc non Af Amer: 29 mL/min/{1.73_m2} — ABNORMAL LOW (ref >59)
GFR, EST AFRICAN AMERICAN: 33 mL/min/{1.73_m2} — AB (ref >59)
GLUCOSE: 114 mg/dL — AB (ref 65–99)
POTASSIUM: 5.1 mmol/L (ref 3.5–5.2)
Sodium: 141 mmol/L (ref 134–144)

## 2014-07-02 ENCOUNTER — Other Ambulatory Visit: Payer: Self-pay | Admitting: *Deleted

## 2014-07-08 ENCOUNTER — Telehealth: Payer: Self-pay | Admitting: Family Medicine

## 2014-07-08 NOTE — Telephone Encounter (Signed)
Discussed with Misty Stanley.

## 2014-07-08 NOTE — Telephone Encounter (Signed)
Patient said that the we called her about her blood work and normally we always call [name], her granddaughter.  [name] would like a nurse to call her at the number provided.

## 2014-07-11 ENCOUNTER — Ambulatory Visit: Payer: Medicare Other | Admitting: Family Medicine

## 2014-07-11 ENCOUNTER — Encounter: Payer: Self-pay | Admitting: Family Medicine

## 2014-07-11 ENCOUNTER — Ambulatory Visit (INDEPENDENT_AMBULATORY_CARE_PROVIDER_SITE_OTHER): Payer: Medicare Other | Admitting: Family Medicine

## 2014-07-11 VITALS — BP 134/68 | Temp 97.8°F | Ht 63.0 in | Wt 174.4 lb

## 2014-07-11 DIAGNOSIS — R748 Abnormal levels of other serum enzymes: Secondary | ICD-10-CM

## 2014-07-11 DIAGNOSIS — R7989 Other specified abnormal findings of blood chemistry: Secondary | ICD-10-CM

## 2014-07-11 DIAGNOSIS — I10 Essential (primary) hypertension: Secondary | ICD-10-CM | POA: Diagnosis not present

## 2014-07-11 NOTE — Progress Notes (Signed)
   Subjective:    Patient ID: [name], female    DOB: 21-Jul-1935, 79 y.o.   MRN: [id number]  HPI  Follow up to discuss blood work. Patient has no other concerns this visit. See previous notes. This patient having some issues with swelling in the legs some shortness of breath but doing actually much better the past couple weeks she denies any chest tightness pressure pain she does relate shortness of breath with activity consistent with her COPD Review of Systems  Constitutional: Negative for activity change, appetite change and fatigue.  HENT: Negative for congestion.   Respiratory: Negative for cough.   Cardiovascular: Negative for chest pain.  Gastrointestinal: Negative for abdominal pain.  Endocrine: Negative for polydipsia and polyphagia.  Neurological: Negative for weakness.  Psychiatric/Behavioral: Negative for confusion.       Objective:   Physical Exam  Constitutional: She appears well-nourished. No distress.  Cardiovascular: Normal rate, regular rhythm and normal heart sounds.   No murmur heard. Pulmonary/Chest: Effort normal and breath sounds normal. No respiratory distress.  Musculoskeletal: She exhibits no edema.  Lymphadenopathy:    She has no cervical adenopathy.  Neurological: She is alert. She exhibits normal muscle tone.  Psychiatric: Her behavior is normal.  Vitals reviewed.  Recent lab work shows creatinine hovering at 1.7 this is similar to what it's been in the past will be monitored. Sodium-potassium good sugar slightly up.       Assessment & Plan:  Repeat metabolic 7 in approximately 2-3 weeks Continue current medications I would recommend diaphoretic 1 or 2 in the morning another one midday depending on swelling and weight. Continue to watch salt diet stay active as COPD allows.

## 2014-07-11 NOTE — Patient Instructions (Addendum)
Her weights look good  Blood pressure today is good  Continue to monitor blood pressure and her weights  Recheck here in 3 months  Recent labs were printed  I recommend to repeat the Met 7 test in 1 month to see how her kidney function is doing  The order is in the system please do the week of June 20th

## 2014-07-12 ENCOUNTER — Other Ambulatory Visit: Payer: Self-pay | Admitting: Family Medicine

## 2014-07-15 DIAGNOSIS — I5031 Acute diastolic (congestive) heart failure: Secondary | ICD-10-CM | POA: Diagnosis not present

## 2014-07-15 DIAGNOSIS — J449 Chronic obstructive pulmonary disease, unspecified: Secondary | ICD-10-CM | POA: Diagnosis not present

## 2014-07-15 DIAGNOSIS — I10 Essential (primary) hypertension: Secondary | ICD-10-CM | POA: Diagnosis not present

## 2014-07-15 DIAGNOSIS — F0390 Unspecified dementia without behavioral disturbance: Secondary | ICD-10-CM | POA: Diagnosis not present

## 2014-07-15 DIAGNOSIS — I2510 Atherosclerotic heart disease of native coronary artery without angina pectoris: Secondary | ICD-10-CM | POA: Diagnosis not present

## 2014-07-15 DIAGNOSIS — I252 Old myocardial infarction: Secondary | ICD-10-CM | POA: Diagnosis not present

## 2014-07-27 DIAGNOSIS — I5031 Acute diastolic (congestive) heart failure: Secondary | ICD-10-CM | POA: Diagnosis not present

## 2014-07-27 DIAGNOSIS — I252 Old myocardial infarction: Secondary | ICD-10-CM | POA: Diagnosis not present

## 2014-07-27 DIAGNOSIS — I2510 Atherosclerotic heart disease of native coronary artery without angina pectoris: Secondary | ICD-10-CM | POA: Diagnosis not present

## 2014-07-27 DIAGNOSIS — I10 Essential (primary) hypertension: Secondary | ICD-10-CM | POA: Diagnosis not present

## 2014-07-27 DIAGNOSIS — F0390 Unspecified dementia without behavioral disturbance: Secondary | ICD-10-CM | POA: Diagnosis not present

## 2014-07-27 DIAGNOSIS — J449 Chronic obstructive pulmonary disease, unspecified: Secondary | ICD-10-CM | POA: Diagnosis not present

## 2014-08-01 ENCOUNTER — Ambulatory Visit: Payer: Medicare Other | Admitting: Nurse Practitioner

## 2014-08-11 DIAGNOSIS — J449 Chronic obstructive pulmonary disease, unspecified: Secondary | ICD-10-CM | POA: Diagnosis not present

## 2014-08-11 DIAGNOSIS — I252 Old myocardial infarction: Secondary | ICD-10-CM | POA: Diagnosis not present

## 2014-08-11 DIAGNOSIS — I2510 Atherosclerotic heart disease of native coronary artery without angina pectoris: Secondary | ICD-10-CM | POA: Diagnosis not present

## 2014-08-11 DIAGNOSIS — I5031 Acute diastolic (congestive) heart failure: Secondary | ICD-10-CM | POA: Diagnosis not present

## 2014-08-11 DIAGNOSIS — F0390 Unspecified dementia without behavioral disturbance: Secondary | ICD-10-CM | POA: Diagnosis not present

## 2014-08-11 DIAGNOSIS — I10 Essential (primary) hypertension: Secondary | ICD-10-CM | POA: Diagnosis not present

## 2014-08-13 ENCOUNTER — Encounter: Payer: Self-pay | Admitting: Nurse Practitioner

## 2014-08-13 ENCOUNTER — Ambulatory Visit (INDEPENDENT_AMBULATORY_CARE_PROVIDER_SITE_OTHER): Payer: Medicare Other | Admitting: Nurse Practitioner

## 2014-08-13 VITALS — BP 128/74 | HR 56 | Ht 63.0 in | Wt 176.0 lb

## 2014-08-13 DIAGNOSIS — I422 Other hypertrophic cardiomyopathy: Secondary | ICD-10-CM | POA: Diagnosis not present

## 2014-08-13 DIAGNOSIS — I5032 Chronic diastolic (congestive) heart failure: Secondary | ICD-10-CM | POA: Diagnosis not present

## 2014-08-13 DIAGNOSIS — I2510 Atherosclerotic heart disease of native coronary artery without angina pectoris: Secondary | ICD-10-CM | POA: Diagnosis not present

## 2014-08-13 LAB — CBC
HCT: 30.1 % — ABNORMAL LOW (ref 36.0–46.0)
Hemoglobin: 10.1 g/dL — ABNORMAL LOW (ref 12.0–15.0)
MCHC: 33.7 g/dL (ref 30.0–36.0)
MCV: 84.9 fl (ref 78.0–100.0)
Platelets: 167 10*3/uL (ref 150.0–400.0)
RBC: 3.54 Mil/uL — ABNORMAL LOW (ref 3.87–5.11)
RDW: 15.6 % — ABNORMAL HIGH (ref 11.5–15.5)
WBC: 5.3 10*3/uL (ref 4.0–10.5)

## 2014-08-13 LAB — BASIC METABOLIC PANEL
BUN: 32 mg/dL — ABNORMAL HIGH (ref 6–23)
CO2: 30 mEq/L (ref 19–32)
Calcium: 8.7 mg/dL (ref 8.4–10.5)
Chloride: 103 mEq/L (ref 96–112)
Creatinine, Ser: 1.88 mg/dL — ABNORMAL HIGH (ref 0.40–1.20)
GFR: 27.6 mL/min — ABNORMAL LOW (ref >60.00)
Glucose, Bld: 91 mg/dL (ref 70–99)
Potassium: 4.4 mEq/L (ref 3.5–5.1)
Sodium: 141 mEq/L (ref 135–145)

## 2014-08-13 NOTE — Progress Notes (Signed)
CARDIOLOGY OFFICE NOTE  Date:  08/13/2014    [name] Date of Birth: April 08, 1935 Medical Record #[id number]  PCP:  Heather Punt, MD  Cardiologist:  [name]    Chief Complaint  Patient presents with  . FU for diastolic HF    3 month check. Seen for Dr. Excell Cummings.     History of Present Illness: [name] is a 79 y.o. female who presents today for a follow up visit. Seen for Dr. Excell Cummings. She has CAD and diastolic heart failure. She has coronary artery disease status post PCI of the LAD in 2011. She underwent cardiac catheterization in 2014 demonstrating patency of her stent site in the LAD. There was no other evidence of obstructive disease noted. Ventriculography was suggestive of apical hypertrophy. A cardiac MRI suggests a variant of hypertrophic cardiomyopathy as well. The patient is now O2 dependent.  Her other issues include dementia, HTN, COPD, HLD and diastolic HF. She has been noted to be quite sedentary  The patient was hospitalized in February 25 through March 2 of this year with acute on chronic respiratory failure. This was felt to be multi-factorial, but there was clearly a component of diastolic heart failure. She briefly required BiPAP. She was diuresed with clinical improvement. She remains on home oxygen for chronic respiratory failure and COPD. She had been noncompliant with medical therapy and her diet. SHe has   Seen in March by Dr. Excell Cummings and noting to be doing much better since discharge from the hospital.   Comes in today. Here with her daughter today. Granddaughter - [name] works now in [place] here. Seems to be doing ok. Has good days and bad days. The hot/humid days make her short of breath and she will have some chest tightness. She is wearing her oxygen "most of the time". Using "sea salt" now. Weight is up and down. Really does not take extra diuretic if needed. Overall she says she is happy with how she is doing. Told her daughter she was having some  dizzy spells out in the lobby but denies this to me.   Past Medical History  Diagnosis Date  . CAD (coronary artery disease) 08/2009    s/p PCI of the LAD  . Myocardial infarction     Non-st segment elevated  . Hypertension   . Hyperlipidemia   . Bradycardia   . Carotid artery stenosis   . COPD (chronic obstructive pulmonary disease)   . Chronic headache   . Asthma   . Pneumonia   . Ulnar neuropathy   . Seasonal allergies   . Impaired fasting glucose   . Dementia     patient denies this  . Osteoporosis 2009  . Acute on chronic diastolic heart failure 04/11/2014    Past Surgical History  Procedure Laterality Date  . Partial hysterectomy  1984  . Radial artery catheter      Bladder  . Bladder surgery  1991  . Cardiac catheterization  11/2010    negative  . Left heart catheterization with coronary angiogram N/A 11/05/2012    Procedure: LEFT HEART CATHETERIZATION WITH CORONARY ANGIOGRAM;  Surgeon: Micheline Chapman, MD;  Location: [facility] CATH LAB;  Service: Cardiovascular;  Laterality: N/A;     Medications: Current Outpatient Prescriptions  Medication Sig Dispense Refill  . albuterol (PROVENTIL HFA;VENTOLIN HFA) 108 (90 BASE) MCG/ACT inhaler Inhale 2 puffs into the lungs 3 (three) times daily. 1 Inhaler 3  . albuterol (PROVENTIL) (2.5 MG/3ML) 0.083% nebulizer solution  Take 3 mLs (2.5 mg total) by nebulization every 4 (four) hours as needed for wheezing or shortness of breath. 75 mL 2  . alendronate (FOSAMAX) 70 MG tablet TAKE 1 TABLET BY MOUTH ONCE WEEKLY AS DIRECTED 12 tablet 1  . Alpha-D-Galactosidase (BEANO) TABS Take 2 tablets by mouth 3 times/day as needed-between meals & bedtime.     Marland Kitchen amLODipine (NORVASC) 5 MG tablet 1/2 tablet daily 45 tablet 3  . aspirin 81 MG EC tablet Take 81 mg by mouth every morning.     . budesonide-formoterol (SYMBICORT) 160-4.5 MCG/ACT inhaler Inhale 2 puffs into the lungs 2 (two) times daily. 1 Inhaler 12  . Calcium Carbonate (CALCIUM 600 PO)  Take by mouth.    . calcium gluconate 500 MG tablet Take 1 tablet by mouth daily.    . Cholecalciferol (VITAMIN D3) 1000 UNITS CAPS Take 1 capsule by mouth daily.    . citalopram (CELEXA) 40 MG tablet TAKE 1 TABLET BY MOUTH EVERY MORNING 90 tablet 0  . donepezil (ARICEPT) 10 MG tablet TAKE 1 TABLET BY MOUTH EVERY NIGHT AT BEDTIME 90 tablet 1  . erythromycin ophthalmic ointment USE IN THE EYE AS DIRECTED 3.5 g 0  . furosemide (LASIX) 40 MG tablet Take 2 tablets in the morning and 1 tablet in the evening (Patient taking differently: 40 mg. TAKES 80 MG IN AM TAKES 40 MG IN PM) 90 tablet 2  . LORazepam (ATIVAN) 1 MG tablet TAKE ONE-HALF TO ONE WHOLE TABLET BY MOUTH EVERY NIGHT AT BEDTIME AS NEEDED 30 tablet 4  . losartan (COZAAR) 100 MG tablet TAKE 1 TABLET BY MOUTH DAILY (Patient taking differently: TAKE ONE-HALF TABLET BY MOUTH DAILY) 90 tablet 1  . metoprolol tartrate (LOPRESSOR) 25 MG tablet Take 0.5 tablets (12.5 mg total) by mouth 2 (two) times daily. 180 tablet 3  . nitroGLYCERIN (NITROSTAT) 0.4 MG SL tablet Place 1 tablet (0.4 mg total) under the tongue as needed for chest pain. 25 tablet 11  . polyethylene glycol (MIRALAX / GLYCOLAX) packet Take 17 g by mouth daily as needed for moderate constipation.    . potassium chloride (K-DUR,KLOR-CON) 10 MEQ tablet Take 1 tablet (10 mEq total) by mouth 2 (two) times daily. 30 tablet 5  . pravastatin (PRAVACHOL) 40 MG tablet TAKE 1 TABLET BY MOUTH EVERY DAY 90 tablet 0   No current facility-administered medications for this visit.    Allergies: Allergies  Allergen Reactions  . Acetaminophen     Made her feel like her heart was pounding  . Codeine Nausea And Vomiting  . Dilaudid [Hydromorphone Hcl] Other (See Comments)    Extremely sensitive  . Erythromycin Other (See Comments)    Intense stomach pain  . Sulfonamide Derivatives     unknown    Social History: The patient  reports that she quit smoking about 6 years ago. She has never used  smokeless tobacco. She reports that she does not drink alcohol or use illicit drugs.   Family History: The patient's family history includes Addison's disease in her mother; Alcohol abuse in her father.   Review of Systems: Please see the history of present illness.   Otherwise, the review of systems is positive for dyspnea, DOE, back pain, dizziness, easy bruising, sweating, fatigue, wheezing, joint swelling and balance issues.   All other systems are reviewed and negative.   Physical Exam: VS:  BP 128/74 mmHg  Pulse 56  Ht 5\' 3"  (1.6 m)  Wt 176 lb (79.833 kg)  BMI  31.18 kg/m2 .  BMI Body mass index is 31.18 kg/(m^2).  Wt Readings from Last 3 Encounters:  08/13/14 176 lb (79.833 kg)  07/11/14 174 lb 6 oz (79.096 kg)  06/09/14 177 lb 6 oz (80.457 kg)    General: Pleasant. Well developed, well nourished and in no acute distress.  HEENT: Normal with right eye abnormality.  Neck: Supple, no JVD, carotid bruits, or masses noted.  Cardiac: She has an irregular rhythm today noted. No murmurs, rubs, or gallops. No edema.  Respiratory:  Lungs are decreased and with normal work of breathing. She has oxygen in place.  GI: Soft and nontender.  MS: No deformity or atrophy. Gait and ROM intact. Skin: Warm and dry. Color is normal.  Neuro:  Strength and sensation are intact and no gross focal deficits noted.  Psych: Alert, appropriate and with normal affect.   LABORATORY DATA:  EKG:  EKG is ordered today. This demonstrates marked bradycardia with lateral ST/T wave changes.   Lab Results  Component Value Date   WBC 6.1 06/07/2014   HGB 10.1* 06/07/2014   HCT 30.4* 06/07/2014   PLT 133* 06/07/2014   GLUCOSE 114* 06/30/2014   CHOL 133 12/06/2013   TRIG 153* 12/06/2013   HDL 33* 12/06/2013   LDLCALC 69 12/06/2013   ALT 9 06/07/2014   AST 15 06/07/2014   NA 141 06/30/2014   K 5.1 06/30/2014   CL 97 06/30/2014   CREATININE 1.70* 06/30/2014   BUN 26 06/30/2014   CO2 28 06/30/2014    TSH 3.086 12/06/2013   INR 1.11 05/07/2013   HGBA1C 6.3* 04/25/2014    BNP (last 3 results)  Recent Labs  04/10/14 1433 06/07/14 0321  BNP 989.0* 582.0*    ProBNP (last 3 results) No results for input(s): PROBNP in the last 8760 hours.   Other Studies Reviewed Today:  2D echocardiogram 03/2014 Study Conclusions  - Left ventricle: The cavity size was normal. Wall thickness was increased in a pattern of mild LVH. Systolic function was vigorous. The estimated ejection fraction was in the range of 65% to 70%. Wall motion was normal; there were no regional wall motion abnormalities. Features are consistent with a pseudonormal left ventricular filling pattern, with concomitant abnormal relaxation and increased filling pressure (grade 2 diastolic dysfunction). - Aortic valve: Mildly calcified annulus. Trileaflet; mildly thickened leaflets. There was trivial regurgitation. Valve area (VTI): 1.83 cm^2. Valve area (Vmax): 1.74 cm^2. Valve area (Vmean): 2.02 cm^2. - Left atrium: The atrium was moderately dilated. - Right atrium: The atrium was mildly dilated. - Pericardium, extracardiac: There is a large left pleural effusion. There was no pericardial effusion. - Technically difficult study.  ASSESSMENT AND PLAN: 1. Acute on chronic diastolic heart failure. She has New York Heart Association functional class III symptoms. Difficult to determine how much of her limitation relates to cardiac versus pulmonary disease. There continues to be a component of both.   2. Coronary artery disease, native vessel: Would continue to manage medically. She is bradycardic today - only on very low dose beta blocker which I am stopping today.    3. Hypertension: Continues with her current regimen.  4. CKD - rechecking labs today.  Overall prognosis quite tenuous at best.   Current medicines are reviewed with the patient today.  The patient does not have concerns regarding  medicines other than what has been noted above.  The following changes have been made:  See above.  Labs/ tests ordered today include:  Orders Placed This Encounter  Procedures  . Basic metabolic panel  . CBC  . EKG 12-Lead     Disposition:   FU with me in 2 months; Dr. Excell Cummings in 6 months.   Patient is agreeable to this plan and will call if any problems develop in the interim.   Signed: Rosalio Macadamia, RN, ANP-C 08/13/2014 4:01 PM  [facility] [address] [place], [place]  [address] Phone: [phone] Fax: [phone]

## 2014-08-13 NOTE — Patient Instructions (Addendum)
We will be checking the following labs today - BMET and CBC   Medication Instructions:    Continue with your current medicines BUT   I am stopping Metoprolol    Testing/Procedures To Be Arranged:  N/A  Follow-Up:   I will see you back in 2 months  See Dr. Excell Seltzer in 6 months    Other Special Instructions:   N/A  Call the [facility] office at [phone] if you have any questions, problems or concerns.

## 2014-08-14 ENCOUNTER — Telehealth: Payer: Self-pay

## 2014-08-14 MED ORDER — FUROSEMIDE 40 MG PO TABS: ORAL_TABLET | ORAL | Status: DC

## 2014-08-14 NOTE — Telephone Encounter (Signed)
Labs reviewed by [facility] APP.  Lasix dosage decreased to 80 mg per day.  Ordered 40 mg am and 40 mg pm.  Have patient call if weight gain is greater than 2 lbs.

## 2014-09-06 ENCOUNTER — Other Ambulatory Visit: Payer: Self-pay | Admitting: Family Medicine

## 2014-09-25 ENCOUNTER — Telehealth: Payer: Self-pay | Admitting: Family Medicine

## 2014-09-25 MED ORDER — POTASSIUM CHLORIDE CRYS ER 10 MEQ PO TBCR: EXTENDED_RELEASE_TABLET | ORAL | Status: DC

## 2014-09-25 NOTE — Telephone Encounter (Signed)
Pt needs a refill on her potassium and the pt's granddaughter needs to speak with someone regarding getting the pt an emergency oxygen tank.   [facility]

## 2014-09-25 NOTE — Telephone Encounter (Signed)
Notified [name] that refill on med was sent to pharmacy and script for oxygen tank will be upfront for her to pickup.  [name] stated that she faxed over a form from [employer] today and she needs the doctor to fill this out asap so that patient's lights will stay on due to her having COPD and needing her oxygen machines. Please forward form to Dr. Lorin Picket.

## 2014-09-25 NOTE — Telephone Encounter (Signed)
Form filled in please finish demographic portions please

## 2014-09-25 NOTE — Telephone Encounter (Signed)
Form in message pile

## 2014-09-26 NOTE — Telephone Encounter (Signed)
Form faxed. stacey notified. Form to be scanned into chart.

## 2014-10-02 DIAGNOSIS — I10 Essential (primary) hypertension: Secondary | ICD-10-CM | POA: Diagnosis not present

## 2014-10-02 DIAGNOSIS — R748 Abnormal levels of other serum enzymes: Secondary | ICD-10-CM | POA: Diagnosis not present

## 2014-10-03 ENCOUNTER — Encounter: Payer: Self-pay | Admitting: Family Medicine

## 2014-10-03 LAB — BASIC METABOLIC PANEL
BUN / CREAT RATIO: 15 (ref 11–26)
BUN: 26 mg/dL (ref 8–27)
CO2: 24 mmol/L (ref 18–29)
Calcium: 8.8 mg/dL (ref 8.7–10.3)
Chloride: 98 mmol/L (ref 97–108)
Creatinine, Ser: 1.7 mg/dL — ABNORMAL HIGH (ref 0.57–1.00)
GFR calc Af Amer: 33 mL/min/{1.73_m2} — ABNORMAL LOW (ref >59)
GFR calc non Af Amer: 29 mL/min/{1.73_m2} — ABNORMAL LOW (ref >59)
Glucose: 91 mg/dL (ref 65–99)
POTASSIUM: 4.7 mmol/L (ref 3.5–5.2)
Sodium: 140 mmol/L (ref 134–144)

## 2014-10-06 ENCOUNTER — Ambulatory Visit (INDEPENDENT_AMBULATORY_CARE_PROVIDER_SITE_OTHER): Payer: Medicare Other | Admitting: Family Medicine

## 2014-10-06 ENCOUNTER — Encounter: Payer: Self-pay | Admitting: Family Medicine

## 2014-10-06 VITALS — BP 116/64 | Temp 98.5°F | Ht 63.0 in | Wt 175.0 lb

## 2014-10-06 DIAGNOSIS — B0229 Other postherpetic nervous system involvement: Secondary | ICD-10-CM | POA: Diagnosis not present

## 2014-10-06 DIAGNOSIS — I2510 Atherosclerotic heart disease of native coronary artery without angina pectoris: Secondary | ICD-10-CM | POA: Diagnosis not present

## 2014-10-06 DIAGNOSIS — H547 Unspecified visual loss: Secondary | ICD-10-CM | POA: Diagnosis not present

## 2014-10-06 MED ORDER — GABAPENTIN 100 MG PO CAPS: 100.0000 mg | ORAL_CAPSULE | Freq: Three times a day (TID) | ORAL | Status: DC

## 2014-10-06 NOTE — Progress Notes (Signed)
   Subjective:    Patient ID: [name], female    DOB: Mar 02, 1935, 79 y.o.   MRN: [id number]  Rash This is a new problem. The current episode started in the past 7 days. The problem is unchanged. The affected locations include the head. The rash is characterized by pain and redness. She was exposed to nothing. Associated symptoms include eye pain. Past treatments include nothing. The treatment provided no relief.   Patient states that she has no other concerns at this time.  Patient relates that the vision in her left eye has dramatically gone down over the past week she does not know of anything that triggered it. 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  Review of Systems  Eyes: Positive for pain.  Skin: Positive for rash.   The granddaughter saw some skin lesions over the weekend but they have gone.   apparently the grandmother was able to read newspaper up to a couple weeks ago but since then unable to do reading Objective:   Physical Exam  There is no obvious deformity on the left eye but there is cloudiness of the lens which raises into question cataract Lungs clear heart regular no lesions are noted on the head     Assessment & Plan:  There was concern of shingles I do not believe this patient is dealing with shingles currently.  I do believe they are dealing with postherpetic neuralgia I recommend gabapentin slowly titrate to 100 mg 3 times daily  Decreased vision left eye probably due to cataract but need to exclude other reasons the daughter states that her vision decline has occurred relatively quickly over the past couple weeks urgent referral to optometry  The main reason for the referral is to make sure there is not any underlying condition that is cause dramatic visual changes. It is possible this could be cataract related visual change that the patient did not notice until recently.  In regards to the neuralgia I will  see the patient back in approximately 3 weeks

## 2014-10-06 NOTE — Patient Instructions (Signed)
Start 1 per day for 5 days Then 1 two times a day for 5 days then one 3 times a day

## 2014-10-07 DIAGNOSIS — H3562 Retinal hemorrhage, left eye: Secondary | ICD-10-CM | POA: Diagnosis not present

## 2014-10-07 DIAGNOSIS — I10 Essential (primary) hypertension: Secondary | ICD-10-CM | POA: Diagnosis not present

## 2014-10-07 DIAGNOSIS — H35032 Hypertensive retinopathy, left eye: Secondary | ICD-10-CM | POA: Diagnosis not present

## 2014-10-08 DIAGNOSIS — H3581 Retinal edema: Secondary | ICD-10-CM | POA: Diagnosis not present

## 2014-10-08 DIAGNOSIS — H34832 Tributary (branch) retinal vein occlusion, left eye: Secondary | ICD-10-CM | POA: Diagnosis not present

## 2014-10-08 DIAGNOSIS — H2513 Age-related nuclear cataract, bilateral: Secondary | ICD-10-CM | POA: Diagnosis not present

## 2014-10-08 DIAGNOSIS — H40003 Preglaucoma, unspecified, bilateral: Secondary | ICD-10-CM | POA: Diagnosis not present

## 2014-10-10 ENCOUNTER — Telehealth: Payer: Self-pay | Admitting: Family Medicine

## 2014-10-10 ENCOUNTER — Encounter: Payer: Self-pay | Admitting: Nurse Practitioner

## 2014-10-10 ENCOUNTER — Ambulatory Visit (INDEPENDENT_AMBULATORY_CARE_PROVIDER_SITE_OTHER): Payer: Medicare Other | Admitting: Nurse Practitioner

## 2014-10-10 VITALS — BP 120/72 | HR 52 | Ht 63.0 in | Wt 176.1 lb

## 2014-10-10 DIAGNOSIS — R0600 Dyspnea, unspecified: Secondary | ICD-10-CM

## 2014-10-10 DIAGNOSIS — I5032 Chronic diastolic (congestive) heart failure: Secondary | ICD-10-CM | POA: Diagnosis not present

## 2014-10-10 DIAGNOSIS — I2510 Atherosclerotic heart disease of native coronary artery without angina pectoris: Secondary | ICD-10-CM

## 2014-10-10 MED ORDER — POTASSIUM CHLORIDE CRYS ER 10 MEQ PO TBCR: EXTENDED_RELEASE_TABLET | ORAL | Status: DC

## 2014-10-10 NOTE — Patient Instructions (Addendum)
We will be checking the following labs today - NONE   Medication Instructions:    Continue with your current medicines.     Testing/Procedures To Be Arranged:  N/A  Follow-Up:   See Dr. Excell Seltzer as planned in October   Other Special Instructions:   Try to limit your salt - this includes Sea Salt.   Call the [facility] office at [phone] if you have any questions, problems or concerns.

## 2014-10-10 NOTE — Telephone Encounter (Signed)
Right now, patient is having to go to the pharmacy 2-3 a month to get her potassium filled.  Kennyth Arnold wants to know if the quantity on the Rx can be changed so that they do not have to get this medication filled as often.  [facility]

## 2014-10-10 NOTE — Telephone Encounter (Signed)
Called patient and informed her that per Dr.Scott verbal order I sent in potassium 10 MEQ #60 with 5 refills. Patient verbalized understanding.

## 2014-10-10 NOTE — Progress Notes (Signed)
CARDIOLOGY OFFICE NOTE  Date:  10/10/2014    [name] Date of Birth: 06-08-1935 Medical Record #[id number]  PCP:  Lilyan Punt, MD  Cardiologist:  Excell Seltzer    Chief Complaint  Patient presents with  . Diastolic HF    Seen for Dr. Excell Seltzer    History of Present Illness: [name] is a 79 y.o. female who presents today for a follow up visit. Seen for Dr. Excell Seltzer. She has CAD and diastolic heart failure. She has coronary artery disease status post PCI of the LAD in 2011. She underwent cardiac catheterization in 2014 demonstrating patency of her stent site in the LAD. There was no other evidence of obstructive disease noted. Ventriculography was suggestive of apical hypertrophy. A cardiac MRI suggests a variant of hypertrophic cardiomyopathy as well. The patient is now O2 dependent.  Her other issues include dementia, HTN, COPD, HLD and diastolic HF. She has been noted to be quite sedentary  The patient was hospitalized in February 25 through March 2 of this year with acute on chronic respiratory failure. This was felt to be multi-factorial, but there was clearly a component of diastolic heart failure. She briefly required BiPAP. She was diuresed with clinical improvement. She remains on home oxygen for chronic respiratory failure and COPD. She had been noncompliant with medical therapy and her diet. SHe has   Seen in March by Dr. Excell Seltzer and noting to be doing much better since discharge from the hospital.   I saw her back in June - she was doing ok. Was bradycardic - stopped her beta blocker. Salt use continued to be an issue. Some chest pain and dyspnea noted but overall felt to be stable.   Comes in today. Here with her daughter today.  She is doing ok. Going to have eye surgery on her right eye - it's going to be opened up, cataract removed and scar tissue removed. Short of breath with exertion. This is unchanged and she remains at her baseline. She notes that she will  have to stop several times during the day and then goes back to doing her activities. Not any worse but no better. No chest pain. No syncope. Doing better on her diet but family does not agree - continues to use [employer]. Overall, she is ok with how she is doing.   Past Medical History  Diagnosis Date  . CAD (coronary artery disease) 08/2009    s/p PCI of the LAD  . Myocardial infarction     Non-st segment elevated  . Hypertension   . Hyperlipidemia   . Bradycardia   . Carotid artery stenosis   . COPD (chronic obstructive pulmonary disease)   . Chronic headache   . Asthma   . Pneumonia   . Ulnar neuropathy   . Seasonal allergies   . Impaired fasting glucose   . Dementia     patient denies this  . Osteoporosis 2009  . Acute on chronic diastolic heart failure 04/11/2014    Past Surgical History  Procedure Laterality Date  . Partial hysterectomy  1984  . Radial artery catheter      Bladder  . Bladder surgery  1991  . Cardiac catheterization  11/2010    negative  . Left heart catheterization with coronary angiogram N/A 11/05/2012    Procedure: LEFT HEART CATHETERIZATION WITH CORONARY ANGIOGRAM;  Surgeon: Micheline Chapman, MD;  Location[facility] CATH LAB;  Service: Cardiovascular;  Laterality: N/A;  Medications: Current Outpatient Prescriptions  Medication Sig Dispense Refill  . albuterol (PROVENTIL HFA;VENTOLIN HFA) 108 (90 BASE) MCG/ACT inhaler Inhale 2 puffs into the lungs 3 (three) times daily. 1 Inhaler 3  . albuterol (PROVENTIL) (2.5 MG/3ML) 0.083% nebulizer solution Take 3 mLs (2.5 mg total) by nebulization every 4 (four) hours as needed for wheezing or shortness of breath. 75 mL 2  . alendronate (FOSAMAX) 70 MG tablet TAKE 1 TABLET BY MOUTH ONCE WEEKLY AS DIRECTED 12 tablet 1  . Alpha-D-Galactosidase (BEANO) TABS Take 2 tablets by mouth 3 times/day as needed-between meals & bedtime.     Marland Kitchen amLODipine (NORVASC) 5 MG tablet 1/2 tablet daily 45 tablet 3  . aspirin 81 MG EC  tablet Take 81 mg by mouth every morning.     . budesonide-formoterol (SYMBICORT) 160-4.5 MCG/ACT inhaler Inhale 2 puffs into the lungs 2 (two) times daily. 1 Inhaler 12  . Calcium Carbonate (CALCIUM 600 PO) Take by mouth.    . calcium gluconate 500 MG tablet Take 1 tablet by mouth daily.    . Cholecalciferol (VITAMIN D3) 1000 UNITS CAPS Take 1 capsule by mouth daily.    . citalopram (CELEXA) 40 MG tablet TAKE 1 TABLET BY MOUTH EVERY MORNING 90 tablet 0  . donepezil (ARICEPT) 10 MG tablet TAKE 1 TABLET BY MOUTH EVERY NIGHT AT BEDTIME 90 tablet 1  . erythromycin ophthalmic ointment USE IN THE EYE AS DIRECTED 3.5 g 0  . furosemide (LASIX) 40 MG tablet Take one tablet in the morning and one tablet in the evening 90 tablet 2  . gabapentin (NEURONTIN) 100 MG capsule Take 1 capsule (100 mg total) by mouth 3 (three) times daily. (Patient taking differently: Take 100 mg by mouth 3 (three) times daily. 100 mg this week 200 mg following week 300 mg following week) 90 capsule 3  . LORazepam (ATIVAN) 1 MG tablet TAKE ONE-HALF TO ONE WHOLE TABLET BY MOUTH EVERY NIGHT AT BEDTIME AS NEEDED 30 tablet 4  . losartan (COZAAR) 100 MG tablet TAKE 1 TABLET BY MOUTH DAILY (Patient taking differently: TAKE ONE-HALF TABLET BY MOUTH DAILY) 90 tablet 1  . nitroGLYCERIN (NITROSTAT) 0.4 MG SL tablet Place 1 tablet (0.4 mg total) under the tongue as needed for chest pain. 25 tablet 11  . NON FORMULARY Place 2 L into the nose continuous. O2    . polyethylene glycol (MIRALAX / GLYCOLAX) packet Take 17 g by mouth daily as needed for moderate constipation.    . potassium chloride (K-DUR,KLOR-CON) 10 MEQ tablet TAKE 1 TABLET(10 MEQ) BY MOUTH TWICE DAILY 30 tablet 3  . pravastatin (PRAVACHOL) 40 MG tablet TAKE 1 TABLET BY MOUTH EVERY DAY 90 tablet 0  . TRAVATAN Z 0.004 % SOLN ophthalmic solution Place 1 drop into the left eye at bedtime.      No current facility-administered medications for this visit.    Allergies: Allergies    Allergen Reactions  . Acetaminophen     Made her feel like her heart was pounding  . Codeine Nausea And Vomiting  . Dilaudid [Hydromorphone Hcl] Other (See Comments)    Extremely sensitive  . Erythromycin Other (See Comments)    Intense stomach pain  . Sulfonamide Derivatives     unknown    Social History: The patient  reports that she quit smoking about 6 years ago. She has never used smokeless tobacco. She reports that she does not drink alcohol or use illicit drugs.   Family History: The patient's family history includes  Addison's disease in her mother; Alcohol abuse in her father.   Review of Systems: Please see the history of present illness.   Otherwise, the review of systems is positive for none.   All other systems are reviewed and negative.   Physical Exam: VS:  BP 120/72 mmHg  Pulse 52  Ht 5\' 3"  (1.6 m)  Wt 176 lb 1.9 oz (79.888 kg)  BMI 31.21 kg/m2  SpO2 98% .  BMI Body mass index is 31.21 kg/(m^2).  Wt Readings from Last 3 Encounters:  10/10/14 176 lb 1.9 oz (79.888 kg)  10/06/14 175 lb (79.379 kg)  08/13/14 176 lb (79.833 kg)    General: Pleasant. Well developed, well nourished and in no acute distress.  HEENT: Normal. Neck: Supple, no JVD, carotid bruits, or masses noted.  Cardiac: Regular rate and rhythm. Her heart rate is 52 by me as well. No murmurs, rubs, or gallops. No edema.  Respiratory:  Lungs are clear to auscultation bilaterally with normal work of breathing.  GI: Soft and nontender.  MS: No deformity or atrophy. Gait and ROM intact. Skin: Warm and dry. Color is normal.  Neuro:  Strength and sensation are intact and no gross focal deficits noted.  Psych: Alert, appropriate and with normal affect.   LABORATORY DATA:  EKG:  EKG is not ordered today.   Lab Results  Component Value Date   WBC 5.3 08/13/2014   HGB 10.1* 08/13/2014   HCT 30.1* 08/13/2014   PLT 167.0 08/13/2014   GLUCOSE 91 10/02/2014   CHOL 133 12/06/2013   TRIG 153*  12/06/2013   HDL 33* 12/06/2013   LDLCALC 69 12/06/2013   ALT 9 06/07/2014   AST 15 06/07/2014   NA 140 10/02/2014   K 4.7 10/02/2014   CL 98 10/02/2014   CREATININE 1.70* 10/02/2014   BUN 26 10/02/2014   CO2 24 10/02/2014   TSH 3.086 12/06/2013   INR 1.11 05/07/2013   HGBA1C 6.3* 04/25/2014    BNP (last 3 results)  Recent Labs  04/10/14 1433 06/07/14 0321  BNP 989.0* 582.0*    ProBNP (last 3 results) No results for input(s): PROBNP in the last 8760 hours.   Other Studies Reviewed Today:  2D echocardiogram 03/2014 Study Conclusions  - Left ventricle: The cavity size was normal. Wall thickness was increased in a pattern of mild LVH. Systolic function was vigorous. The estimated ejection fraction was in the range of 65% to 70%. Wall motion was normal; there were no regional wall motion abnormalities. Features are consistent with a pseudonormal left ventricular filling pattern, with concomitant abnormal relaxation and increased filling pressure (grade 2 diastolic dysfunction). - Aortic valve: Mildly calcified annulus. Trileaflet; mildly thickened leaflets. There was trivial regurgitation. Valve area (VTI): 1.83 cm^2. Valve area (Vmax): 1.74 cm^2. Valve area (Vmean): 2.02 cm^2. - Left atrium: The atrium was moderately dilated. - Right atrium: The atrium was mildly dilated. - Pericardium, extracardiac: There is a large left pleural effusion. There was no pericardial effusion. - Technically difficult study.  ASSESSMENT AND PLAN: 1. Chronic diastolic heart failure. She has New York Heart Association functional class III symptoms. Difficult to determine how much of her limitation relates to cardiac versus pulmonary disease. There continues to be a component of both. For now, she seems to be holding her own. Continue with salt restriction, oxygen therapy, etc.   2. Coronary artery disease, native vessel: Would continue to manage medically.  I have  left her on her current regimen. She currently  has no symptoms.   3. Hypertension: Continues with her current regimen.  4. CKD   5. Upcoming eye surgery - should be ok to proceed.   6. Bradycardia - no longer on beta blocker therapy. She is not symptomatic. She is on Aricept which may be contributing.  Overall prognosis quite tenuous at best but she seems to be holding her own. See back as planned.    Current medicines are reviewed with the patient today.  The patient does not have concerns regarding medicines other than what has been noted above.  The following changes have been made:  See above.  Labs/ tests ordered today include:   No orders of the defined types were placed in this encounter.     Disposition:   FU with Dr. Excell Seltzer in October as planned.   Patient is agreeable to this plan and will call if any problems develop in the interim.   Signed: Rosalio Macadamia, RN, ANP-C 10/10/2014 11:22 AM  [facility] [address] [place], [place]  [address] Phone: [phone] Fax: [phone]

## 2014-10-13 ENCOUNTER — Other Ambulatory Visit: Payer: Self-pay | Admitting: Family Medicine

## 2014-10-13 ENCOUNTER — Ambulatory Visit: Payer: Medicare Other | Admitting: Family Medicine

## 2014-10-21 ENCOUNTER — Telehealth: Payer: Self-pay | Admitting: Family Medicine

## 2014-10-21 NOTE — Telephone Encounter (Signed)
LMRC

## 2014-10-21 NOTE — Telephone Encounter (Signed)
Spoke with Patient's granddaughter and informed her per Dr.Steve-One more forty then back down to 80 and see how it goes of the Lasix. Patient's granddaughter verbalized understanding.

## 2014-10-21 NOTE — Telephone Encounter (Signed)
Granddaughter states she is giving 80mg  qam and 40mg  at 2 pm. Her creatinine was up at one time. She was having a little sob, swelling in feet and abdomen. No sob now and swelling in feet and abdomen are gone. Wants to know if she can continue at this dose.

## 2014-10-21 NOTE — Telephone Encounter (Signed)
Pt gained 5.8 lbs over the weekend (sat specifically) tried to call the on call nurse Unable to get through, Stacy increased her med to 40mg  in the after, she took her normal Dose of 80 mg in the am. She did the same thing yesterday.   Please advise, leave message

## 2014-10-21 NOTE — Telephone Encounter (Signed)
One more forty then back down to 80 and see how it goes

## 2014-10-23 ENCOUNTER — Encounter: Payer: Self-pay | Admitting: Family Medicine

## 2014-10-23 ENCOUNTER — Ambulatory Visit (INDEPENDENT_AMBULATORY_CARE_PROVIDER_SITE_OTHER): Payer: Medicare Other | Admitting: Family Medicine

## 2014-10-23 VITALS — BP 132/78 | Temp 98.3°F | Wt 178.0 lb

## 2014-10-23 DIAGNOSIS — R7303 Prediabetes: Secondary | ICD-10-CM

## 2014-10-23 DIAGNOSIS — I2510 Atherosclerotic heart disease of native coronary artery without angina pectoris: Secondary | ICD-10-CM | POA: Diagnosis not present

## 2014-10-23 DIAGNOSIS — R7309 Other abnormal glucose: Secondary | ICD-10-CM | POA: Diagnosis not present

## 2014-10-23 DIAGNOSIS — J449 Chronic obstructive pulmonary disease, unspecified: Secondary | ICD-10-CM | POA: Diagnosis not present

## 2014-10-23 DIAGNOSIS — E785 Hyperlipidemia, unspecified: Secondary | ICD-10-CM | POA: Diagnosis not present

## 2014-10-23 DIAGNOSIS — Z79899 Other long term (current) drug therapy: Secondary | ICD-10-CM

## 2014-10-23 DIAGNOSIS — R0902 Hypoxemia: Secondary | ICD-10-CM | POA: Diagnosis not present

## 2014-10-23 DIAGNOSIS — N289 Disorder of kidney and ureter, unspecified: Secondary | ICD-10-CM | POA: Diagnosis not present

## 2014-10-23 MED ORDER — PREDNISONE 20 MG PO TABS
ORAL_TABLET | ORAL | Status: DC
Start: 2014-10-23 — End: 2014-11-06

## 2014-10-23 NOTE — Patient Instructions (Addendum)
Furosemide  [name] states she is taking 40 mg, 2 each am  I recommend keep taking 2 each am  Do your labs within next 6 days  Follow up by Sept 22 or 23

## 2014-10-23 NOTE — Progress Notes (Addendum)
   Subjective:    Patient ID: [name], female    DOB: 1935/07/29, 79 y.o.   MRN: [id number]  HPIperipheral edema and shortness of breath. Started over the weekend. Pt states she is taking one lasix daily. Med list states 40mg . Pt's granddaughter called earlier in the week and stated she took 80 mg in the am.   Having eye surgery soon on sept 27th.  Patient has prediabetes she tries watch diet she admits she doesn't always do so She has hyperlipidemia she takes her medication doesn't always watch diet is unable to do much physical activity As severe COPD issues is on oxygen. Gets severely hypoxic off of her oxygen. Recently has had some wheezing and coughing no high fevers She has a history of peripheral edema and also CHF this been under good control weight did go up she increased her Lasix and her weight went down. She does try to be careful with salt diet Patient is due for lab work.   Review of Systems  Constitutional: Negative for activity change and appetite change.  Respiratory: Negative for cough and chest tightness.   Cardiovascular: Negative for chest pain and leg swelling.  Gastrointestinal: Negative for vomiting and abdominal pain.  Neurological: Negative for weakness.  Psychiatric/Behavioral: Negative for confusion.       Objective:   Physical Exam  Constitutional: She appears well-nourished. No distress.  HENT:  Head: Normocephalic.  Cardiovascular: Normal rate, regular rhythm and normal heart sounds.   No murmur heard. Pulmonary/Chest: Effort normal. She has wheezes. She has no rales. She exhibits no tenderness.  Musculoskeletal: She exhibits no edema.  Lymphadenopathy:    She has no cervical adenopathy.  Neurological: She is alert.  Psychiatric: Her behavior is normal.  Vitals reviewed.         Assessment & Plan:  History of edema history of CHF-currently I don't find any evidence of fluid overload I recommend that she uses her Lasix 40 mg every  morning. She will continue monitoring her weight she will let us know if it changes.  Severe COPD with exacerbation-prednisone taper over the next 9 days continue the oxygen. I don't feel the patient needs any antibiotics currently. If mucoid drainage or fevers occur follow-up immediately here or ER  Patient with significant hypoxia due to her COPD. Her O2 sat is 80 on room air. Her O2 sat with oxygen is 94. With walking her O2 sat maintains in the low 90's ( 92). This patient must have her oxygen in order to function properly. This will be needed for her lifetime.  Prediabetes-check A1c Hyperlipidemia check lipid liver profile await results continue medication watch diet Patient on medication that can cause blood loss plus also history of anemia check CBC Patient has history of renal insufficiency on diuretic check lab work. Patient to follow-up in approximately 3 weeks for recheck before her cataract surgery. Greater than 25 minutes was spent with this patient covering multiple different items.

## 2014-10-26 ENCOUNTER — Other Ambulatory Visit: Payer: Self-pay | Admitting: Family Medicine

## 2014-10-26 ENCOUNTER — Telehealth: Payer: Self-pay | Admitting: Family Medicine

## 2014-10-26 NOTE — Telephone Encounter (Signed)
[name]-I will hand write a letter regarding her oxygen. The patient may maintain her current medication including Lasix 80 mg. I would highly recommend follow-up office visit within the next few weeks. The most recent office visit from last week has detailed dictation that should grant her the oxygen. If any problems let me

## 2014-10-27 ENCOUNTER — Telehealth: Payer: Self-pay | Admitting: Family Medicine

## 2014-10-27 MED ORDER — FUROSEMIDE 40 MG PO TABS: ORAL_TABLET | ORAL | Status: DC

## 2014-10-27 NOTE — Telephone Encounter (Signed)
Letter given to Byng to fax.

## 2014-10-27 NOTE — Telephone Encounter (Signed)
Notes in the, we can include this information when [employer] [name] the of medical necessity

## 2014-10-27 NOTE — Telephone Encounter (Signed)
Lorazepam this and 5 refills, Lasix-new instructions 40 mg, 2qam,30 day 6 rf

## 2014-10-27 NOTE — Telephone Encounter (Signed)
[name] left me a voice message on 10/24/2014 updating me that [name] is taking is taking 2L of oxygen at home and bumping it up to 3L when she goes out.

## 2014-10-28 ENCOUNTER — Ambulatory Visit: Payer: Medicare Other | Admitting: Family Medicine

## 2014-10-28 DIAGNOSIS — E785 Hyperlipidemia, unspecified: Secondary | ICD-10-CM | POA: Diagnosis not present

## 2014-10-28 DIAGNOSIS — Z79899 Other long term (current) drug therapy: Secondary | ICD-10-CM | POA: Diagnosis not present

## 2014-10-28 DIAGNOSIS — R0902 Hypoxemia: Secondary | ICD-10-CM | POA: Diagnosis not present

## 2014-10-28 DIAGNOSIS — N289 Disorder of kidney and ureter, unspecified: Secondary | ICD-10-CM | POA: Diagnosis not present

## 2014-10-28 DIAGNOSIS — J449 Chronic obstructive pulmonary disease, unspecified: Secondary | ICD-10-CM | POA: Diagnosis not present

## 2014-10-28 DIAGNOSIS — R7309 Other abnormal glucose: Secondary | ICD-10-CM | POA: Diagnosis not present

## 2014-10-29 LAB — CBC WITH DIFFERENTIAL/PLATELET
BASOS: 1 %
Basophils Absolute: 0 10*3/uL (ref 0.0–0.2)
EOS (ABSOLUTE): 0.1 10*3/uL (ref 0.0–0.4)
EOS: 1 %
HEMATOCRIT: 34.4 % (ref 34.0–46.6)
Hemoglobin: 11 g/dL — ABNORMAL LOW (ref 11.1–15.9)
Immature Grans (Abs): 0.1 10*3/uL (ref 0.0–0.1)
Immature Granulocytes: 1 %
Lymphocytes Absolute: 1.7 10*3/uL (ref 0.7–3.1)
Lymphs: 24 %
MCH: 27.3 pg (ref 26.6–33.0)
MCHC: 32 g/dL (ref 31.5–35.7)
MCV: 85 fL (ref 79–97)
MONOS ABS: 0.6 10*3/uL (ref 0.1–0.9)
Monocytes: 9 %
NEUTROS ABS: 4.6 10*3/uL (ref 1.4–7.0)
Neutrophils: 64 %
Platelets: 202 10*3/uL (ref 150–379)
RBC: 4.03 x10E6/uL (ref 3.77–5.28)
RDW: 14.8 % (ref 12.3–15.4)
WBC: 7.1 10*3/uL (ref 3.4–10.8)

## 2014-10-29 LAB — HEPATIC FUNCTION PANEL
ALT: 14 IU/L (ref 0–32)
AST: 12 IU/L (ref 0–40)
Albumin: 4.6 g/dL (ref 3.5–4.8)
Alkaline Phosphatase: 44 IU/L (ref 39–117)
BILIRUBIN TOTAL: 0.2 mg/dL (ref 0.0–1.2)
Bilirubin, Direct: 0.09 mg/dL (ref 0.00–0.40)
TOTAL PROTEIN: 6.8 g/dL (ref 6.0–8.5)

## 2014-10-29 LAB — BASIC METABOLIC PANEL
BUN / CREAT RATIO: 19 (ref 11–26)
BUN: 31 mg/dL — AB (ref 8–27)
CALCIUM: 9 mg/dL (ref 8.7–10.3)
CO2: 29 mmol/L (ref 18–29)
CREATININE: 1.61 mg/dL — AB (ref 0.57–1.00)
Chloride: 97 mmol/L (ref 97–108)
GFR calc non Af Amer: 31 mL/min/{1.73_m2} — ABNORMAL LOW (ref >59)
GFR, EST AFRICAN AMERICAN: 36 mL/min/{1.73_m2} — AB (ref >59)
Glucose: 91 mg/dL (ref 65–99)
Potassium: 4.5 mmol/L (ref 3.5–5.2)
Sodium: 142 mmol/L (ref 134–144)

## 2014-10-29 LAB — HEMOGLOBIN A1C
Est. average glucose Bld gHb Est-mCnc: 131 mg/dL
HEMOGLOBIN A1C: 6.2 % — AB (ref 4.8–5.6)

## 2014-10-29 LAB — LIPID PANEL
CHOL/HDL RATIO: 3.2 ratio (ref 0.0–4.4)
Cholesterol, Total: 161 mg/dL (ref 100–199)
HDL: 51 mg/dL (ref >39)
LDL CALC: 78 mg/dL (ref 0–99)
Triglycerides: 160 mg/dL — ABNORMAL HIGH (ref 0–149)
VLDL Cholesterol Cal: 32 mg/dL (ref 5–40)

## 2014-10-30 ENCOUNTER — Other Ambulatory Visit: Payer: Self-pay | Admitting: Family Medicine

## 2014-11-06 ENCOUNTER — Ambulatory Visit (INDEPENDENT_AMBULATORY_CARE_PROVIDER_SITE_OTHER): Payer: Medicare Other | Admitting: Family Medicine

## 2014-11-06 ENCOUNTER — Encounter: Payer: Self-pay | Admitting: Family Medicine

## 2014-11-06 VITALS — BP 112/78 | Ht 63.0 in | Wt 181.4 lb

## 2014-11-06 DIAGNOSIS — J449 Chronic obstructive pulmonary disease, unspecified: Secondary | ICD-10-CM

## 2014-11-06 DIAGNOSIS — M545 Low back pain: Secondary | ICD-10-CM

## 2014-11-06 DIAGNOSIS — I2510 Atherosclerotic heart disease of native coronary artery without angina pectoris: Secondary | ICD-10-CM | POA: Diagnosis not present

## 2014-11-06 DIAGNOSIS — Z23 Encounter for immunization: Secondary | ICD-10-CM

## 2014-11-06 NOTE — Progress Notes (Signed)
   Subjective:    Patient ID: [name], female    DOB: 12/03/1935, 79 y.o.   MRN: [id number]  HPI Patient is here today for a COPD exacerbation follow up visit. Patient states that she is doing very well. Her breathing has improved a lot.   Patient has no concerns at this time.  Patient states she uses her medicines as prescribed. Denies overusing it.  Review of Systems She relates shortness of breath with activity denies chest pressure pain denies vomiting or diarrhea fevers or chills.    Objective:   Physical Exam  Lungs are clear hearts regular pulse normal extremities no edema skin warm dry patient using her oxygen currently Patient with significant low back pain radiates down right leg difficult range of motion difficult pain with stretching the leg she requests referral to physical therapy.     Assessment & Plan:   COPD exacerbation-he doing better now on O2. Continue O2. Continue current measures. Follow-up in 3-4 months sooner problems   no sign of CHF going on currently continue diuretic at current level

## 2014-11-11 ENCOUNTER — Encounter: Payer: Self-pay | Admitting: Family Medicine

## 2014-11-11 DIAGNOSIS — H34832 Tributary (branch) retinal vein occlusion, left eye: Secondary | ICD-10-CM | POA: Diagnosis not present

## 2014-11-11 DIAGNOSIS — H40003 Preglaucoma, unspecified, bilateral: Secondary | ICD-10-CM | POA: Diagnosis not present

## 2014-11-19 ENCOUNTER — Ambulatory Visit (HOSPITAL_COMMUNITY): Payer: Medicare Other | Attending: Family Medicine | Admitting: Physical Therapy

## 2014-11-19 DIAGNOSIS — R29898 Other symptoms and signs involving the musculoskeletal system: Secondary | ICD-10-CM | POA: Insufficient documentation

## 2014-11-19 DIAGNOSIS — R269 Unspecified abnormalities of gait and mobility: Secondary | ICD-10-CM | POA: Insufficient documentation

## 2014-11-19 DIAGNOSIS — R293 Abnormal posture: Secondary | ICD-10-CM | POA: Insufficient documentation

## 2014-11-19 DIAGNOSIS — R262 Difficulty in walking, not elsewhere classified: Secondary | ICD-10-CM | POA: Insufficient documentation

## 2014-11-19 DIAGNOSIS — M545 Low back pain: Secondary | ICD-10-CM | POA: Insufficient documentation

## 2014-11-26 ENCOUNTER — Ambulatory Visit (HOSPITAL_COMMUNITY): Payer: Medicare Other | Admitting: Physical Therapy

## 2014-11-26 DIAGNOSIS — R293 Abnormal posture: Secondary | ICD-10-CM | POA: Diagnosis not present

## 2014-11-26 DIAGNOSIS — R269 Unspecified abnormalities of gait and mobility: Secondary | ICD-10-CM | POA: Diagnosis not present

## 2014-11-26 DIAGNOSIS — R29898 Other symptoms and signs involving the musculoskeletal system: Secondary | ICD-10-CM | POA: Diagnosis not present

## 2014-11-26 DIAGNOSIS — M545 Low back pain, unspecified: Secondary | ICD-10-CM

## 2014-11-26 DIAGNOSIS — R262 Difficulty in walking, not elsewhere classified: Secondary | ICD-10-CM

## 2014-11-26 NOTE — Patient Instructions (Signed)
Backward Bend (Standing)    Arch backward to make hollow of back deeper. Hold _5___ seconds. Repeat _10___ times per set. Do _1___ sets per session. Do _3___ sessions per day.  http://orth.exer.us/178   Copyright  VHI. All rights reserved.  Knee-to-Chest Stretch: Unilateral    With hand behind right knee, pull knee in to chest until a comfortable stretch is felt in lower back and buttocks. Keep back relaxed. Hold __30_ seconds. Repeat ___1_ times per set. Do __2__ sets per session. Do ___3_ sessions per day.  http://orth.exer.us/126   Copyright  VHI. All rights reserved.  Lumbar Rotation (Non-Weight Bearing)    Feet on floor, slowly rock knees from side to side in small, pain-free range of motion. Allow lower back to rotate slightly. Repeat _10___ times per set. Do __1__ sets per session. Do __2__ sessions per day.  http://orth.exer.us/160   Copyright  VHI. All rights reserved.  Isometric Abdominal    Lying on back with knees bent, tighten stomach by pressing elbows down. Hold __3__ seconds. Repeat __10__ times per set. Do ___1_ sets per session. Do _3___ sessions per day.  http://orth.exer.us/1086   Copyright  VHI. All rights reserved.  Isometric Gluteals    Tighten buttock muscles.1 Repeat _10___ times per set. Do ___1_ sets per session. Do __3__ sessions per day.  http://orth.exer.us/1126   Copyright  VHI. All rights reserved.  Scapular Retraction (Standing)    With arms at sides, pinch shoulder blades together. Repeat __10__ times per set. Do _1___ sets per session. Do _2___ sessions per day.  http://orth.exer.us/944   Copyright  VHI. All rights reserved.

## 2014-11-26 NOTE — Therapy (Signed)
[facility] [address] [place], [place], [address] Phone: [phone]   Fax:  [phone]  Physical Therapy Evaluation  Patient Details  Name: [name] MRN: [id number] Date of Birth: 04-Feb-1936 Referring Provider:  Babs Sciara, MD  Encounter Date: 11/26/2014      PT End of Session - 11/26/14 1212    Visit Number 1   Number of Visits 12   Date for PT Re-Evaluation 12/26/14   Authorization Type medicare   PT Start Time 1104   PT Stop Time 1149   PT Time Calculation (min) 45 min   Equipment Utilized During Treatment Gait belt   Activity Tolerance Patient tolerated treatment well   Behavior During Therapy [facility] for tasks assessed/performed      Past Medical History  Diagnosis Date  . CAD (coronary artery disease) 08/2009    s/p PCI of the LAD  . Myocardial infarction     Non-st segment elevated  . Hypertension   . Hyperlipidemia   . Bradycardia   . Carotid artery stenosis   . COPD (chronic obstructive pulmonary disease)   . Chronic headache   . Asthma   . Pneumonia   . Ulnar neuropathy   . Seasonal allergies   . Impaired fasting glucose   . Dementia     patient denies this  . Osteoporosis 2009  . Acute on chronic diastolic heart failure 04/11/2014    Past Surgical History  Procedure Laterality Date  . Partial hysterectomy  1984  . Radial artery catheter      Bladder  . Bladder surgery  1991  . Cardiac catheterization  11/2010    negative  . Left heart catheterization with coronary angiogram N/A 11/05/2012    Procedure: LEFT HEART CATHETERIZATION WITH CORONARY ANGIOGRAM;  Surgeon: Micheline Chapman, MD;  Location: [facility] CATH LAB;  Service: Cardiovascular;  Laterality: N/A;    There were no vitals filed for this visit.  Visit Diagnosis:  Bilateral low back pain without sciatica  Difficulty walking up stairs  Abnormality of gait  Poor posture  Bilateral leg weakness      Subjective Assessment -  11/26/14 1208    Subjective Ms. Brunette states that she has had progressive back pain for a year or more.  She states that at times the pain is so severe that it makes her nauseated.  She has been using biofreeze without reduction in pain.  She states that she is at the point that she feels she needs the wheelchair to get around the grocery store.  She also notes that her balance has been deteriortating.    She is now being referred to PT to reduce her pain.     Pertinent History OA, asthma, CHF, MI HNT , PVD, heart stent.    How long can you sit comfortably? Shifts weight almost immediately.    How long can you stand comfortably? Able to stand less than 20 minutes    How long can you walk comfortably? unable to walk more than 5 minutes;  ( pt O2 dependent but she states that most of this is due to her back).   Currently in Pain? Yes   Pain Score 7   goes as high as a 9    Pain Location Back   Pain Orientation Lower   Pain Descriptors / Indicators Aching;Constant;Sharp   Pain Type Chronic pain   Pain Onset More than a month ago   Pain Frequency Constant  Aggravating Factors  hourewrok    Pain Relieving Factors ibuprofen             [facility] PT Assessment - 11/26/14 0001    Assessment   Medical Diagnosis Low back Pain    Onset Date/Surgical Date 11/12/14   Prior Therapy N/a   Precautions   Precautions None   Restrictions   Weight Bearing Restrictions No   Balance Screen   Has the patient fallen in the past 6 months No   Has the patient had a decrease in activity level because of a fear of falling?  Yes   Is the patient reluctant to leave their home because of a fear of falling?  No   Home [employer]ist information centre manager residence   Prior Function   Level of Independence Independent   Cognition   Overall Cognitive Status Within Functional Limits for tasks assessed   Observation/Other Assessments   Focus on Therapeutic Outcomes (FOTO)  27   Functional Tests    Functional tests Single leg stance   Single Leg Stance   Comments Lt 2 seconds ; Rt 0    Posture/Postural Control   Posture/Postural Control Postural limitations   Postural Limitations Rounded Shoulders;Forward head;Decreased lumbar lordosis;Increased thoracic kyphosis   ROM / Strength   AROM / PROM / Strength AROM;Strength   AROM   AROM Assessment Site Lumbar   Lumbar Flexion fingers 2" past knees worse going down vs. pulling back up    Lumbar Extension decreased 20 % with reps improving sx    Lumbar - Right Side Bend wfl no change of sx    Lumbar - Left Side Bend decreased 25% with no change of sx    Strength   Strength Assessment Site Hip;Knee;Ankle   Right/Left Hip Right;Left   Right Hip Flexion 4+/5   Right Hip Extension 3+/5   Right Hip ABduction 4/5   Left Hip Flexion 4/5   Left Hip Extension 3-/5   Left Hip ABduction 4-/5   Right/Left Knee Right;Left   Right Knee Flexion 4-/5   Right Knee Extension 5/5   Left Knee Flexion 4-/5   Left Knee Extension 5/5   Right/Left Ankle Right;Left   Right Ankle Dorsiflexion 5/5   Left Ankle Dorsiflexion 5/5                   [facility] Adult PT Treatment/Exercise - 11/26/14 0001    Exercises   Exercises Lumbar   Lumbar Exercises: Stretches   Single Knee to Chest Stretch 5 reps   Lower Trunk Rotation 5 reps   Standing Side Bend 5 reps   Lumbar Exercises: Seated   Other Seated Lumbar Exercises scapular retraction x 10    Lumbar Exercises: Supine   Ab Set 10 reps   Glut Set 10 reps                PT Education - 11/26/14 1211    Education provided Yes   Education Details HEP; the importance of posture in back care    Person(s) Educated Patient   Methods Explanation;Handout   Comprehension Verbalized understanding;Returned demonstration          PT Short Term Goals - 11/26/14 1218    PT SHORT TERM GOAL #1   Title I HEP   Time 1   Period Weeks   PT SHORT TERM GOAL #2   Title Pt pain to be no greater  than a 5/10   Time 2  Period Weeks   PT SHORT TERM GOAL #3   Title Pt to be able to sit with comfort for 30 mintues to be able to enjoy a meal    Time 2   Period Weeks   PT SHORT TERM GOAL #4   Title Pt to be able to walk with comfort for 15 mintues to be able to complete a light grocery strore trip    Time 2   Period Weeks   PT SHORT TERM GOAL #5   Title Pt to be able to stand with comfort for 30 mintues to make a meal    Time 2   Period Weeks           PT Long Term Goals - 2014-12-16 1220    PT LONG TERM GOAL #1   Title Pt to be I in advance HEP   Time 4   Period Weeks   PT LONG TERM GOAL #2   Title Pt pain level to be no greater than a 3/10   Time 4   Period Weeks   PT LONG TERM GOAL #3   Title Pt to be able to sit for an hour to be able to travel or eat a meal restaurant with comfort.    Time 4   Period Weeks   PT LONG TERM GOAL #4   Title Pt to be able to walk for 30 mintues with no increase pain to be able to complet grocery shopping in comfort.    Time 4   Period Weeks   PT LONG TERM GOAL #5   Title Pt to be able to SLS x 10 seconds B to reduce risk of falling    Time 4   Period Weeks               Plan - 12-16-14 1212    Clinical Impression Statement Ms. Emry is a 79 yo female who has been having chronic low back pain for a year but has progressed to a point where she is having difficulty walking.  She has been referred to physical therapy.  Examination demonstrates a bias for extension, decreased core, and LE strength as well as decreased balance.  Ms. Mergenthaler will  benefit from skilled PT to address these deficiet and maximize her functional abiliity to improve her quality of life.    Pt will benefit from skilled therapeutic intervention in order to improve on the following deficits Abnormal gait;Decreased activity tolerance;Decreased balance;Decreased range of motion;Decreased strength;Difficulty walking;Pain;Hypomobility;Increased fascial  restricitons   Rehab Potential Good   PT Frequency 3x / week   PT Duration 4 weeks   PT Treatment/Interventions ADLs/Self Care Home Management;Patient/family education;Manual techniques;Therapeutic activities;Therapeutic exercise;Balance training;Neuromuscular re-education   PT Next Visit Plan begin supine stabilization with bent knee; bridge, clams and isometric hip flexion.    Consulted and Agree with Plan of Care Patient          G-Codes - 16-Dec-2014 1223    Functional Limitation Other PT primary   Other PT Primary Current Status (Z6109) At least 60 percent but less than 80 percent impaired, limited or restricted   Other PT Primary Goal Status (U0454) At least 40 percent but less than 60 percent impaired, limited or restricted       Problem List Patient Active Problem List   Diagnosis Date Noted  . Renal insufficiency 04/25/2014  . Acute on chronic diastolic heart failure (HCC) 04/11/2014  . Acute on chronic respiratory failure (HCC) 04/11/2014  .  CKD (chronic kidney disease) stage 3, GFR 30-59 ml/min 04/11/2014  . Prediabetes 12/03/2013  . Hypertrophic obstructive cardiomyopathy (HCC) 11/09/2013  . Hypoxia 08/28/2013  . Respiratory failure with hypoxia (HCC) 05/07/2013  . Acute diastolic CHF (congestive heart failure) (HCC) 05/07/2013  . COPD exacerbation (HCC) 02/15/2013  . Hyperlipidemia 11/09/2010  . Acute diastolic heart failure (HCC) 10/15/2010  . CAD, NATIVE VESSEL 09/18/2009  . BRADYCARDIA 08/19/2009  . Dementia 07/19/2007  . Essential hypertension 07/19/2007  . CAROTID ARTERY STENOSIS 07/19/2007  . ASTHMA 07/19/2007  . COPD with asthma (HCC) 07/19/2007  . HEADACHE, CHRONIC, HX OF 07/19/2007    Virgina Organ, PT CLT [phone] 11/26/2014, 12:26 PM  [facility] [address] [place], [place], [address] Phone: [phone]   Fax:  [phone]

## 2014-12-01 ENCOUNTER — Ambulatory Visit (HOSPITAL_COMMUNITY): Payer: Medicare Other | Admitting: Physical Therapy

## 2014-12-01 DIAGNOSIS — R293 Abnormal posture: Secondary | ICD-10-CM | POA: Diagnosis not present

## 2014-12-01 DIAGNOSIS — R269 Unspecified abnormalities of gait and mobility: Secondary | ICD-10-CM | POA: Diagnosis not present

## 2014-12-01 DIAGNOSIS — R29898 Other symptoms and signs involving the musculoskeletal system: Secondary | ICD-10-CM | POA: Diagnosis not present

## 2014-12-01 DIAGNOSIS — R262 Difficulty in walking, not elsewhere classified: Secondary | ICD-10-CM

## 2014-12-01 DIAGNOSIS — M545 Low back pain, unspecified: Secondary | ICD-10-CM

## 2014-12-01 NOTE — Therapy (Signed)
[facility] [address] [place], [place], [address] Phone: [phone]   Fax:  [phone]  Physical Therapy Treatment  Patient Details  Name: [name] MRN: [id number] Date of Birth: 1935-04-19 No Data Recorded  Encounter Date: 12/01/2014      PT End of Session - 12/01/14 1003    Visit Number 2   Number of Visits 12   Date for PT Re-Evaluation 12/26/14   Authorization Type medicare   PT Start Time 0935   PT Stop Time 1025   PT Time Calculation (min) 50 min   Equipment Utilized During Treatment Gait belt   Activity Tolerance Patient tolerated treatment well   Behavior During Therapy [facility] for tasks assessed/performed      Past Medical History  Diagnosis Date  . CAD (coronary artery disease) 08/2009    s/p PCI of the LAD  . Myocardial infarction     Non-st segment elevated  . Hypertension   . Hyperlipidemia   . Bradycardia   . Carotid artery stenosis   . COPD (chronic obstructive pulmonary disease)   . Chronic headache   . Asthma   . Pneumonia   . Ulnar neuropathy   . Seasonal allergies   . Impaired fasting glucose   . Dementia     patient denies this  . Osteoporosis 2009  . Acute on chronic diastolic heart failure 04/11/2014    Past Surgical History  Procedure Laterality Date  . Partial hysterectomy  1984  . Radial artery catheter      Bladder  . Bladder surgery  1991  . Cardiac catheterization  11/2010    negative  . Left heart catheterization with coronary angiogram N/A 11/05/2012    Procedure: LEFT HEART CATHETERIZATION WITH CORONARY ANGIOGRAM;  Surgeon: Micheline Chapman, MD;  Location: [facility] CATH LAB;  Service: Cardiovascular;  Laterality: N/A;    There were no vitals filed for this visit.  Visit Diagnosis:  Bilateral low back pain without sciatica  Difficulty walking up stairs  Abnormality of gait  Poor posture  Bilateral leg weakness      Subjective Assessment - 12/01/14 1012    Subjective Pt states she has been doing her exericses.  States she is currently having 5/10 Low back Pain.    Currently in Pain? Yes   Pain Score 5    Pain Location Back   Pain Orientation Lower   Pain Descriptors / Indicators Aching;Discomfort                         [facility] Adult PT Treatment/Exercise - 12/01/14 0001    Lumbar Exercises: Stretches   Single Knee to Chest Stretch 5 reps   Lower Trunk Rotation 5 reps   Standing Extension Limitations   Standing Extension Limitations 10 reps   Lumbar Exercises: Supine   Ab Set 10 reps   Glut Set 10 reps   Clam 10 reps   Bent Knee Raise 10 reps   Bridge 10 reps   Straight Leg Raise 10 reps   Isometric Hip Flexion 10 reps                PT Education - 12/01/14 1003    Education provided Yes   Education Details Review of HEP, given copy of initial evaluation with explanations of measurements and goals.    Person(s) Educated Patient   Methods Explanation;Handout;Demonstration   Comprehension Verbalized understanding;Returned demonstration  PT Short Term Goals - 11/26/14 1218    PT SHORT TERM GOAL #1   Title I HEP   Time 1   Period Weeks   PT SHORT TERM GOAL #2   Title Pt pain to be no greater than a 5/10   Time 2   Period Weeks   PT SHORT TERM GOAL #3   Title Pt to be able to sit with comfort for 30 mintues to be able to enjoy a meal    Time 2   Period Weeks   PT SHORT TERM GOAL #4   Title Pt to be able to walk with comfort for 15 mintues to be able to complete a light grocery strore trip    Time 2   Period Weeks   PT SHORT TERM GOAL #5   Title Pt to be able to stand with comfort for 30 mintues to make a meal    Time 2   Period Weeks           PT Long Term Goals - 11/26/14 1220    PT LONG TERM GOAL #1   Title Pt to be I in advance HEP   Time 4   Period Weeks   PT LONG TERM GOAL #2   Title Pt pain level to be no greater than a 3/10   Time 4   Period Weeks   PT LONG TERM  GOAL #3   Title Pt to be able to sit for an hour to be able to travel or eat a meal restaurant with comfort.    Time 4   Period Weeks   PT LONG TERM GOAL #4   Title Pt to be able to walk for 30 mintues with no increase pain to be able to complet grocery shopping in comfort.    Time 4   Period Weeks   PT LONG TERM GOAL #5   Title Pt to be able to SLS x 10 seconds B to reduce risk of falling    Time 4   Period Weeks               Plan - 12/01/14 1008    Clinical Impression Statement Reveiwed HEP and initial evaluation/goals.  Pt able to demonstrate HEP correctly with multimodal cues for all exericises to improve stabilization and for form.  Instructed with new therex per PT POC without c/o pain or difficulties.  Pt reported overall pain reduction at end of session.  Complaince reported with HEP.    PT Next Visit Plan Continue to progress towards goals.  Progress exericses and improve abdominal and lumbar stabilization. Add actvitiy tolerance wtih gait or nustep.  Progress to balance actvities.   Consulted and Agree with Plan of Care Patient        Problem List Patient Active Problem List   Diagnosis Date Noted  . Renal insufficiency 04/25/2014  . Acute on chronic diastolic heart failure (HCC) 04/11/2014  . Acute on chronic respiratory failure (HCC) 04/11/2014  . CKD (chronic kidney disease) stage 3, GFR 30-59 ml/min 04/11/2014  . Prediabetes 12/03/2013  . Hypertrophic obstructive cardiomyopathy (HCC) 11/09/2013  . Hypoxia 08/28/2013  . Respiratory failure with hypoxia (HCC) 05/07/2013  . Acute diastolic CHF (congestive heart failure) (HCC) 05/07/2013  . COPD exacerbation (HCC) 02/15/2013  . Hyperlipidemia 11/09/2010  . Acute diastolic heart failure (HCC) 10/15/2010  . CAD, NATIVE VESSEL 09/18/2009  . BRADYCARDIA 08/19/2009  . Dementia 07/19/2007  . Essential hypertension 07/19/2007  . CAROTID ARTERY  STENOSIS 07/19/2007  . ASTHMA 07/19/2007  . COPD with asthma (HCC)  07/19/2007  . HEADACHE, CHRONIC, HX OF 07/19/2007    Lurena Nida, PTA/CLT [phone]  12/01/2014, 10:41 AM  [facility] [address] [place], [place], [address] Phone: [phone]   Fax:  [phone]  Name: [name] MRN: [id number] Date of Birth: 1935/03/11

## 2014-12-03 ENCOUNTER — Ambulatory Visit (HOSPITAL_COMMUNITY): Payer: Medicare Other | Admitting: Physical Therapy

## 2014-12-03 DIAGNOSIS — M545 Low back pain, unspecified: Secondary | ICD-10-CM

## 2014-12-03 DIAGNOSIS — R29898 Other symptoms and signs involving the musculoskeletal system: Secondary | ICD-10-CM | POA: Diagnosis not present

## 2014-12-03 DIAGNOSIS — R293 Abnormal posture: Secondary | ICD-10-CM

## 2014-12-03 DIAGNOSIS — R262 Difficulty in walking, not elsewhere classified: Secondary | ICD-10-CM

## 2014-12-03 DIAGNOSIS — R269 Unspecified abnormalities of gait and mobility: Secondary | ICD-10-CM | POA: Diagnosis not present

## 2014-12-03 NOTE — Therapy (Signed)
[facility] [address] [place], [place], [address] Phone: [phone]   Fax:  [phone]  Physical Therapy Treatment  Patient Details  Name: [name] MRN: [id number] Date of Birth: 1935/03/11 No Data Recorded  Encounter Date: 12/03/2014      PT End of Session - 12/03/14 1151    Visit Number 3   Number of Visits 12   Date for PT Re-Evaluation 12/26/14   Authorization Type medicare   PT Start Time 1015   PT Stop Time 1100   PT Time Calculation (min) 45 min   Activity Tolerance Patient tolerated treatment well   Behavior During Therapy [facility] for tasks assessed/performed      Past Medical History  Diagnosis Date  . CAD (coronary artery disease) 08/2009    s/p PCI of the LAD  . Myocardial infarction     Non-st segment elevated  . Hypertension   . Hyperlipidemia   . Bradycardia   . Carotid artery stenosis   . COPD (chronic obstructive pulmonary disease)   . Chronic headache   . Asthma   . Pneumonia   . Ulnar neuropathy   . Seasonal allergies   . Impaired fasting glucose   . Dementia     patient denies this  . Osteoporosis 2009  . Acute on chronic diastolic heart failure 04/11/2014    Past Surgical History  Procedure Laterality Date  . Partial hysterectomy  1984  . Radial artery catheter      Bladder  . Bladder surgery  1991  . Cardiac catheterization  11/2010    negative  . Left heart catheterization with coronary angiogram N/A 11/05/2012    Procedure: LEFT HEART CATHETERIZATION WITH CORONARY ANGIOGRAM;  Surgeon: Micheline Chapman, MD;  Location: [facility] CATH LAB;  Service: Cardiovascular;  Laterality: N/A;    There were no vitals filed for this visit.  Visit Diagnosis:  Bilateral low back pain without sciatica  Difficulty walking up stairs  Abnormality of gait  Poor posture      Subjective Assessment - 12/03/14 1020    Subjective Pt reports that she has some pain in her back today, rates it at a 3/10.    Currently in Pain? Yes   Pain Score 3    Pain Location Back   Pain Orientation Lower              [facility] Adult PT Treatment/Exercise - 12/03/14 0001    Lumbar Exercises: Stretches   Active Hamstring Stretch 3 reps;30 seconds   Active Hamstring Stretch Limitations 12" step   Single Knee to Chest Stretch 5 reps;10 seconds   Piriformis Stretch 3 reps;30 seconds   Piriformis Stretch Limitations seated   Lumbar Exercises: Aerobic   Stationary Bike Nustep 8' hills 2 level 2   Lumbar Exercises: Standing   Heel Raises 15 reps   Other Standing Lumbar Exercises standing hip extension with elbows on counter   Lumbar Exercises: Seated   Sit to Stand 10 reps   Sit to Stand Limitations no UE support   Lumbar Exercises: Supine   Ab Set 10 reps   Bent Knee Raise 10 reps   Bridge 15 reps   Straight Leg Raise 10 reps   Lumbar Exercises: Sidelying   Hip Abduction 10 reps                  PT Short Term Goals - 11/26/14 1218    PT SHORT TERM GOAL #1  Title I HEP   Time 1   Period Weeks   PT SHORT TERM GOAL #2   Title Pt pain to be no greater than a 5/10   Time 2   Period Weeks   PT SHORT TERM GOAL #3   Title Pt to be able to sit with comfort for 30 mintues to be able to enjoy a meal    Time 2   Period Weeks   PT SHORT TERM GOAL #4   Title Pt to be able to walk with comfort for 15 mintues to be able to complete a light grocery strore trip    Time 2   Period Weeks   PT SHORT TERM GOAL #5   Title Pt to be able to stand with comfort for 30 mintues to make a meal    Time 2   Period Weeks           PT Long Term Goals - 11/26/14 1220    PT LONG TERM GOAL #1   Title Pt to be I in advance HEP   Time 4   Period Weeks   PT LONG TERM GOAL #2   Title Pt pain level to be no greater than a 3/10   Time 4   Period Weeks   PT LONG TERM GOAL #3   Title Pt to be able to sit for an hour to be able to travel or eat a meal restaurant with comfort.    Time 4   Period Weeks    PT LONG TERM GOAL #4   Title Pt to be able to walk for 30 mintues with no increase pain to be able to complet grocery shopping in comfort.    Time 4   Period Weeks   PT LONG TERM GOAL #5   Title Pt to be able to SLS x 10 seconds B to reduce risk of falling    Time 4   Period Weeks               Plan - 12/03/14 1151    Clinical Impression Statement Treatment session focused on lumbar stretching and core strengthening to decrease LBP. Pt required verbal and tactile cueing to complete abdominal bracing and bent heel raise to ensure proper form. Min A was provided for sidelying hip abduction for pt to achieve full range. Nustep was added to improve functional activity tolerance. Pt reported decreased pain levels following treatment session.    PT Next Visit Plan Continue with core strengthening, add sidestepping        Problem List Patient Active Problem List   Diagnosis Date Noted  . Renal insufficiency 04/25/2014  . Acute on chronic diastolic heart failure (HCC) 04/11/2014  . Acute on chronic respiratory failure (HCC) 04/11/2014  . CKD (chronic kidney disease) stage 3, GFR 30-59 ml/min 04/11/2014  . Prediabetes 12/03/2013  . Hypertrophic obstructive cardiomyopathy (HCC) 11/09/2013  . Hypoxia 08/28/2013  . Respiratory failure with hypoxia (HCC) 05/07/2013  . Acute diastolic CHF (congestive heart failure) (HCC) 05/07/2013  . COPD exacerbation (HCC) 02/15/2013  . Hyperlipidemia 11/09/2010  . Acute diastolic heart failure (HCC) 10/15/2010  . CAD, NATIVE VESSEL 09/18/2009  . BRADYCARDIA 08/19/2009  . Dementia 07/19/2007  . Essential hypertension 07/19/2007  . CAROTID ARTERY STENOSIS 07/19/2007  . ASTHMA 07/19/2007  . COPD with asthma (HCC) 07/19/2007  . HEADACHE, CHRONIC, HX OF 07/19/2007    Leona Singleton, PT, DPT [phone] 12/03/2014, 11:56 AM  [facility]  [address] [place], [place], [address] Phone: [phone]    Fax:  [phone]  Name: [name] MRN: [id number] Date of Birth: [birth year]

## 2014-12-05 ENCOUNTER — Ambulatory Visit (HOSPITAL_COMMUNITY): Payer: Medicare Other | Admitting: Physical Therapy

## 2014-12-05 DIAGNOSIS — R262 Difficulty in walking, not elsewhere classified: Secondary | ICD-10-CM | POA: Diagnosis not present

## 2014-12-05 DIAGNOSIS — R293 Abnormal posture: Secondary | ICD-10-CM

## 2014-12-05 DIAGNOSIS — R269 Unspecified abnormalities of gait and mobility: Secondary | ICD-10-CM

## 2014-12-05 DIAGNOSIS — M545 Low back pain, unspecified: Secondary | ICD-10-CM

## 2014-12-05 DIAGNOSIS — R29898 Other symptoms and signs involving the musculoskeletal system: Secondary | ICD-10-CM

## 2014-12-05 NOTE — Therapy (Signed)
[facility] [address] [place], [place], [address] Phone: [phone]   Fax:  [phone]  Physical Therapy Treatment  Patient Details  Name: [name] MRN: [id number] Date of Birth: Oct 15, 1935 No Data Recorded  Encounter Date: 12/05/2014      PT End of Session - 12/05/14 1423    Visit Number 4   Number of Visits 12   Date for PT Re-Evaluation 12/26/14   Authorization Type medicare   PT Start Time 1347   PT Stop Time 1431   PT Time Calculation (min) 44 min      Past Medical History  Diagnosis Date  . CAD (coronary artery disease) 08/2009    s/p PCI of the LAD  . Myocardial infarction     Non-st segment elevated  . Hypertension   . Hyperlipidemia   . Bradycardia   . Carotid artery stenosis   . COPD (chronic obstructive pulmonary disease)   . Chronic headache   . Asthma   . Pneumonia   . Ulnar neuropathy   . Seasonal allergies   . Impaired fasting glucose   . Dementia     patient denies this  . Osteoporosis 2009  . Acute on chronic diastolic heart failure 04/11/2014    Past Surgical History  Procedure Laterality Date  . Partial hysterectomy  1984  . Radial artery catheter      Bladder  . Bladder surgery  1991  . Cardiac catheterization  11/2010    negative  . Left heart catheterization with coronary angiogram N/A 11/05/2012    Procedure: LEFT HEART CATHETERIZATION WITH CORONARY ANGIOGRAM;  Surgeon: Micheline Chapman, MD;  Location: [facility] CATH LAB;  Service: Cardiovascular;  Laterality: N/A;    There were no vitals filed for this visit.  Visit Diagnosis:  Bilateral low back pain without sciatica  Difficulty walking up stairs  Abnormality of gait  Poor posture  Bilateral leg weakness      Subjective Assessment - 12/05/14 1350    Subjective Pt states that the exercises are getting easier    Pertinent History OA, asthma, CHF, MI HNT , PVD, heart stent.    Pain Score 3                           [facility] Adult PT Treatment/Exercise - 12/05/14 0001    Lumbar Exercises: Stretches   Active Hamstring Stretch 3 reps;30 seconds   Single Knee to Chest Stretch 3 reps;20 seconds   Lower Trunk Rotation 5 reps   Standing Extension 5 reps   Piriformis Stretch 3 reps;30 seconds   Piriformis Stretch Limitations supine    Lumbar Exercises: Aerobic   Stationary Bike Nustep 8' hills 2 level 2   Lumbar Exercises: Standing   Heel Raises 15 reps   Functional Squats 15 reps   Other Standing Lumbar Exercises standing hip extension with elbows on counter   Lumbar Exercises: Seated   Sit to Stand 10 reps   Lumbar Exercises: Supine   Bent Knee Raise 10 reps   Bridge 15 reps   Straight Leg Raise 10 reps   Isometric Hip Flexion 10 reps   Lumbar Exercises: Sidelying   Hip Abduction 15 reps                PT Education - 12/05/14 1423    Education provided No          PT Short Term Goals - 11/26/14  1218    PT SHORT TERM GOAL #1   Title I HEP   Time 1   Period Weeks   PT SHORT TERM GOAL #2   Title Pt pain to be no greater than a 5/10   Time 2   Period Weeks   PT SHORT TERM GOAL #3   Title Pt to be able to sit with comfort for 30 mintues to be able to enjoy a meal    Time 2   Period Weeks   PT SHORT TERM GOAL #4   Title Pt to be able to walk with comfort for 15 mintues to be able to complete a light grocery strore trip    Time 2   Period Weeks   PT SHORT TERM GOAL #5   Title Pt to be able to stand with comfort for 30 mintues to make a meal    Time 2   Period Weeks           PT Long Term Goals - 11/26/14 1220    PT LONG TERM GOAL #1   Title Pt to be I in advance HEP   Time 4   Period Weeks   PT LONG TERM GOAL #2   Title Pt pain level to be no greater than a 3/10   Time 4   Period Weeks   PT LONG TERM GOAL #3   Title Pt to be able to sit for an hour to be able to travel or eat a meal restaurant with comfort.    Time 4    Period Weeks   PT LONG TERM GOAL #4   Title Pt to be able to walk for 30 mintues with no increase pain to be able to complet grocery shopping in comfort.    Time 4   Period Weeks   PT LONG TERM GOAL #5   Title Pt to be able to SLS x 10 seconds B to reduce risk of falling    Time 4   Period Weeks               Plan - 12/05/14 1433    Clinical Impression Statement Pt improving in technique and endurance of exercises.  Pt continues only needs minimal cuing to keep abdominal bracing but constant cuing to keep breathing.  Added side stepping and squats for LE strengthening.    PT Next Visit Plan begin lunging activity.l         Problem List Patient Active Problem List   Diagnosis Date Noted  . Renal insufficiency 04/25/2014  . Acute on chronic diastolic heart failure (HCC) 04/11/2014  . Acute on chronic respiratory failure (HCC) 04/11/2014  . CKD (chronic kidney disease) stage 3, GFR 30-59 ml/min 04/11/2014  . Prediabetes 12/03/2013  . Hypertrophic obstructive cardiomyopathy (HCC) 11/09/2013  . Hypoxia 08/28/2013  . Respiratory failure with hypoxia (HCC) 05/07/2013  . Acute diastolic CHF (congestive heart failure) (HCC) 05/07/2013  . COPD exacerbation (HCC) 02/15/2013  . Hyperlipidemia 11/09/2010  . Acute diastolic heart failure (HCC) 10/15/2010  . CAD, NATIVE VESSEL 09/18/2009  . BRADYCARDIA 08/19/2009  . Dementia 07/19/2007  . Essential hypertension 07/19/2007  . CAROTID ARTERY STENOSIS 07/19/2007  . ASTHMA 07/19/2007  . COPD with asthma (HCC) 07/19/2007  . HEADACHE, CHRONIC, HX OF 07/19/2007    Virgina Organ, PT CLT [phone] 12/05/2014, 2:36 PM  [facility] [address] [place], [place], [address] Phone: [phone]   Fax:  [phone]  Name: Heather  VIRGILIA Cummings MRN: [id number] Date of Birth: 01-Oct-1935

## 2014-12-08 ENCOUNTER — Ambulatory Visit (HOSPITAL_COMMUNITY): Payer: Medicare Other | Admitting: Physical Therapy

## 2014-12-08 DIAGNOSIS — R293 Abnormal posture: Secondary | ICD-10-CM

## 2014-12-08 DIAGNOSIS — R262 Difficulty in walking, not elsewhere classified: Secondary | ICD-10-CM | POA: Diagnosis not present

## 2014-12-08 DIAGNOSIS — R269 Unspecified abnormalities of gait and mobility: Secondary | ICD-10-CM

## 2014-12-08 DIAGNOSIS — R29898 Other symptoms and signs involving the musculoskeletal system: Secondary | ICD-10-CM

## 2014-12-08 DIAGNOSIS — M545 Low back pain, unspecified: Secondary | ICD-10-CM

## 2014-12-08 NOTE — Therapy (Signed)
[facility] [address] [place], [place], [address] Phone: [phone]   Fax:  [phone]  Physical Therapy Treatment  Patient Details  Name: [name] MRN: [id number] Date of Birth: 07-17-1935 No Data Recorded  Encounter Date: 12/08/2014      PT End of Session - 12/08/14 0951    Visit Number 5   Number of Visits 12   Date for PT Re-Evaluation 12/26/14   Authorization Type medicare   PT Start Time 0935   PT Stop Time 1020   PT Time Calculation (min) 45 min   Activity Tolerance Patient tolerated treatment well   Behavior During Therapy [facility] for tasks assessed/performed      Past Medical History  Diagnosis Date  . CAD (coronary artery disease) 08/2009    s/p PCI of the LAD  . Myocardial infarction     Non-st segment elevated  . Hypertension   . Hyperlipidemia   . Bradycardia   . Carotid artery stenosis   . COPD (chronic obstructive pulmonary disease)   . Chronic headache   . Asthma   . Pneumonia   . Ulnar neuropathy   . Seasonal allergies   . Impaired fasting glucose   . Dementia     patient denies this  . Osteoporosis 2009  . Acute on chronic diastolic heart failure 04/11/2014    Past Surgical History  Procedure Laterality Date  . Partial hysterectomy  1984  . Radial artery catheter      Bladder  . Bladder surgery  1991  . Cardiac catheterization  11/2010    negative  . Left heart catheterization with coronary angiogram N/A 11/05/2012    Procedure: LEFT HEART CATHETERIZATION WITH CORONARY ANGIOGRAM;  Surgeon: Micheline Chapman, MD;  Location: [facility] CATH LAB;  Service: Cardiovascular;  Laterality: N/A;    There were no vitals filed for this visit.  Visit Diagnosis:  Bilateral low back pain without sciatica  Difficulty walking up stairs  Abnormality of gait  Poor posture  Bilateral leg weakness      Subjective Assessment - 12/08/14 0949    Subjective Pt states she has been doing her exercises and  getting better overall.  States she only has stiffness in her mid lower back, not really pain.    Currently in Pain? No/denies                         [facility] Adult PT Treatment/Exercise - 12/08/14 0948    Lumbar Exercises: Stretches   Active Hamstring Stretch 3 reps;30 seconds   Active Hamstring Stretch Limitations 12" step   Passive Hamstring Stretch 3 reps;30 seconds   Passive Hamstring Stretch Limitations slant board   Piriformis Stretch 3 reps;30 seconds   Piriformis Stretch Limitations seated   Lumbar Exercises: Standing   Heel Raises 15 reps   Functional Squats 15 reps   Other Standing Lumbar Exercises hip extension, hip abduction 10 reps each   Lumbar Exercises: Supine   Ab Set 15 reps   Bent Knee Raise 15 reps   Bridge 15 reps   Straight Leg Raise 15 reps                  PT Short Term Goals - 12/08/14 1632    PT SHORT TERM GOAL #1   Title I HEP   Time 1   Period Weeks   Status On-going   PT SHORT TERM GOAL #2   Title  Pt pain to be no greater than a 5/10   Time 2   Period Weeks   Status On-going   PT SHORT TERM GOAL #3   Title Pt to be able to sit with comfort for 30 mintues to be able to enjoy a meal    Time 2   Period Weeks   Status On-going   PT SHORT TERM GOAL #4   Title Pt to be able to walk with comfort for 15 mintues to be able to complete a light grocery strore trip    Time 2   Period Weeks   Status On-going   PT SHORT TERM GOAL #5   Title Pt to be able to stand with comfort for 30 mintues to make a meal    Time 2   Period Weeks           PT Long Term Goals - 12/08/14 1632    PT LONG TERM GOAL #1   Title Pt to be I in advance HEP   Time 4   Period Weeks   Status On-going   PT LONG TERM GOAL #2   Title Pt pain level to be no greater than a 3/10   Time 4   Period Weeks   Status On-going   PT LONG TERM GOAL #3   Title Pt to be able to sit for an hour to be able to travel or eat a meal restaurant with comfort.     Time 4   Period Weeks   Status On-going   PT LONG TERM GOAL #4   Title Pt to be able to walk for 30 mintues with no increase pain to be able to complet grocery shopping in comfort.    Time 4   Period Weeks   Status On-going   PT LONG TERM GOAL #5   Title Pt to be able to SLS x 10 seconds B to reduce risk of falling    Time 4   Period Weeks               Plan - 12/08/14 1629    Clinical Impression Statement Manual cues needed for standing hip extension and abduction exercises due to weak core, hip muscles and postural instability.  Pt without pain at end of session and overall improving mobility.    PT Next Visit Plan begin lunging activity next session and progress towards established goals.          Problem List Patient Active Problem List   Diagnosis Date Noted  . Renal insufficiency 04/25/2014  . Acute on chronic diastolic heart failure (HCC) 04/11/2014  . Acute on chronic respiratory failure (HCC) 04/11/2014  . CKD (chronic kidney disease) stage 3, GFR 30-59 ml/min 04/11/2014  . Prediabetes 12/03/2013  . Hypertrophic obstructive cardiomyopathy (HCC) 11/09/2013  . Hypoxia 08/28/2013  . Respiratory failure with hypoxia (HCC) 05/07/2013  . Acute diastolic CHF (congestive heart failure) (HCC) 05/07/2013  . COPD exacerbation (HCC) 02/15/2013  . Hyperlipidemia 11/09/2010  . Acute diastolic heart failure (HCC) 10/15/2010  . CAD, NATIVE VESSEL 09/18/2009  . BRADYCARDIA 08/19/2009  . Dementia 07/19/2007  . Essential hypertension 07/19/2007  . CAROTID ARTERY STENOSIS 07/19/2007  . ASTHMA 07/19/2007  . COPD with asthma (HCC) 07/19/2007  . HEADACHE, CHRONIC, HX OF 07/19/2007    Lurena Nida, PTA/CLT [phone]  12/08/2014, 4:33 PM  [facility] [address] [place], [place], [address] Phone: [phone]   Fax:  [phone]  Name: IVIANNA NOTCH MRN: [id number] Date of Birth: Jun 12, 1935

## 2014-12-09 DIAGNOSIS — H25813 Combined forms of age-related cataract, bilateral: Secondary | ICD-10-CM | POA: Diagnosis not present

## 2014-12-09 DIAGNOSIS — H34832 Tributary (branch) retinal vein occlusion, left eye, with macular edema: Secondary | ICD-10-CM | POA: Diagnosis not present

## 2014-12-10 ENCOUNTER — Ambulatory Visit (HOSPITAL_COMMUNITY): Payer: Medicare Other

## 2014-12-10 DIAGNOSIS — R293 Abnormal posture: Secondary | ICD-10-CM

## 2014-12-10 DIAGNOSIS — M545 Low back pain, unspecified: Secondary | ICD-10-CM

## 2014-12-10 DIAGNOSIS — R262 Difficulty in walking, not elsewhere classified: Secondary | ICD-10-CM | POA: Diagnosis not present

## 2014-12-10 DIAGNOSIS — R29898 Other symptoms and signs involving the musculoskeletal system: Secondary | ICD-10-CM | POA: Diagnosis not present

## 2014-12-10 DIAGNOSIS — R269 Unspecified abnormalities of gait and mobility: Secondary | ICD-10-CM

## 2014-12-10 NOTE — Therapy (Signed)
[facility] [address] [place], [place], [address] Phone: [phone]   Fax:  [phone]  Physical Therapy Treatment  Patient Details  Name: [name] MRN: [id number] Date of Birth: 11/01/1935 No Data Recorded  Encounter Date: 12/10/2014      PT End of Session - 12/10/14 1030    Visit Number 6   Number of Visits 12   Date for PT Re-Evaluation 12/26/14   Authorization Type medicare   PT Start Time 1022   PT Stop Time 1104   PT Time Calculation (min) 42 min   Activity Tolerance Patient tolerated treatment well   Behavior During Therapy [facility] for tasks assessed/performed      Past Medical History  Diagnosis Date  . CAD (coronary artery disease) 08/2009    s/p PCI of the LAD  . Myocardial infarction     Non-st segment elevated  . Hypertension   . Hyperlipidemia   . Bradycardia   . Carotid artery stenosis   . COPD (chronic obstructive pulmonary disease)   . Chronic headache   . Asthma   . Pneumonia   . Ulnar neuropathy   . Seasonal allergies   . Impaired fasting glucose   . Dementia     patient denies this  . Osteoporosis 2009  . Acute on chronic diastolic heart failure 04/11/2014    Past Surgical History  Procedure Laterality Date  . Partial hysterectomy  1984  . Radial artery catheter      Bladder  . Bladder surgery  1991  . Cardiac catheterization  11/2010    negative  . Left heart catheterization with coronary angiogram N/A 11/05/2012    Procedure: LEFT HEART CATHETERIZATION WITH CORONARY ANGIOGRAM;  Surgeon: Micheline Chapman, MD;  Location: [facility] CATH LAB;  Service: Cardiovascular;  Laterality: N/A;    There were no vitals filed for this visit.  Visit Diagnosis:  Bilateral low back pain without sciatica  Difficulty walking up stairs  Abnormality of gait  Poor posture  Bilateral leg weakness      Subjective Assessment - 12/10/14 1026    Subjective Pt stated she is feeling pretty good today,  reports compliance with HEP daily   Currently in Pain? Yes   Pain Score 2    Pain Location Back   Pain Orientation Lower   Pain Descriptors / Indicators Tightness           [facility] Adult PT Treatment/Exercise - 12/10/14 0001    Lumbar Exercises: Standing   Heel Raises 15 reps   Functional Squats 15 reps   Functional Squats Limitations 3D hip excursion   Forward Lunge 10 reps   Forward Lunge Limitations BLE 6in step   Other Standing Lumbar Exercises hip extension, hip abduction 15 reps each   Lumbar Exercises: Supine   Bent Knee Raise 15 reps   Bent Knee Raise Limitations with ab set   Bridge 15 reps   Straight Leg Raise 15 reps            PT Short Term Goals - 12/08/14 1632    PT SHORT TERM GOAL #1   Title I HEP   Time 1   Period Weeks   Status On-going   PT SHORT TERM GOAL #2   Title Pt pain to be no greater than a 5/10   Time 2   Period Weeks   Status On-going   PT SHORT TERM GOAL #3   Title Pt to be able to  sit with comfort for 30 mintues to be able to enjoy a meal    Time 2   Period Weeks   Status On-going   PT SHORT TERM GOAL #4   Title Pt to be able to walk with comfort for 15 mintues to be able to complete a light grocery strore trip    Time 2   Period Weeks   Status On-going   PT SHORT TERM GOAL #5   Title Pt to be able to stand with comfort for 30 mintues to make a meal    Time 2   Period Weeks           PT Long Term Goals - 12/08/14 1632    PT LONG TERM GOAL #1   Title Pt to be I in advance HEP   Time 4   Period Weeks   Status On-going   PT LONG TERM GOAL #2   Title Pt pain level to be no greater than a 3/10   Time 4   Period Weeks   Status On-going   PT LONG TERM GOAL #3   Title Pt to be able to sit for an hour to be able to travel or eat a meal restaurant with comfort.    Time 4   Period Weeks   Status On-going   PT LONG TERM GOAL #4   Title Pt to be able to walk for 30 mintues with no increase pain to be able to complet grocery  shopping in comfort.    Time 4   Period Weeks   Status On-going   PT LONG TERM GOAL #5   Title Pt to be able to SLS x 10 seconds B to reduce risk of falling    Time 4   Period Weeks               Plan - 12/10/14 1206    Clinical Impression Statement Began forward lunges for LE strengthening with verbal and tactile cueing for proper form due to weak core, hip musculature and postural instability.  Began 3D hip excursion to improve hip mobility and reduce lumbar stiffness.  No reports of increased pain through session.     PT Next Visit Plan Begin side lunges next session and progress toward PT POC.          Problem List Patient Active Problem List   Diagnosis Date Noted  . Renal insufficiency 04/25/2014  . Acute on chronic diastolic heart failure (HCC) 04/11/2014  . Acute on chronic respiratory failure (HCC) 04/11/2014  . CKD (chronic kidney disease) stage 3, GFR 30-59 ml/min 04/11/2014  . Prediabetes 12/03/2013  . Hypertrophic obstructive cardiomyopathy (HCC) 11/09/2013  . Hypoxia 08/28/2013  . Respiratory failure with hypoxia (HCC) 05/07/2013  . Acute diastolic CHF (congestive heart failure) (HCC) 05/07/2013  . COPD exacerbation (HCC) 02/15/2013  . Hyperlipidemia 11/09/2010  . Acute diastolic heart failure (HCC) 10/15/2010  . CAD, NATIVE VESSEL 09/18/2009  . BRADYCARDIA 08/19/2009  . Dementia 07/19/2007  . Essential hypertension 07/19/2007  . CAROTID ARTERY STENOSIS 07/19/2007  . ASTHMA 07/19/2007  . COPD with asthma (HCC) 07/19/2007  . HEADACHE, CHRONIC, HX OF 07/19/2007   Becky Sax, LPTA; CBIS [phone]  Juel Burrow 12/10/2014, 12:11 PM  [facility] [address] [place], [place], [address] Phone: [phone]   Fax:  [phone]  Name: SHADAE SENSING MRN: [id number] Date of Birth: [birth year]

## 2014-12-12 ENCOUNTER — Ambulatory Visit (HOSPITAL_COMMUNITY): Payer: Medicare Other | Admitting: Physical Therapy

## 2014-12-12 DIAGNOSIS — M545 Low back pain, unspecified: Secondary | ICD-10-CM

## 2014-12-12 DIAGNOSIS — R262 Difficulty in walking, not elsewhere classified: Secondary | ICD-10-CM | POA: Diagnosis not present

## 2014-12-12 DIAGNOSIS — R29898 Other symptoms and signs involving the musculoskeletal system: Secondary | ICD-10-CM | POA: Diagnosis not present

## 2014-12-12 DIAGNOSIS — R269 Unspecified abnormalities of gait and mobility: Secondary | ICD-10-CM | POA: Diagnosis not present

## 2014-12-12 DIAGNOSIS — R293 Abnormal posture: Secondary | ICD-10-CM | POA: Diagnosis not present

## 2014-12-12 NOTE — Therapy (Signed)
[facility] [address] [place], [place], [address] Phone: [phone]   Fax:  [phone]  Physical Therapy Treatment  Patient Details  Name: CLANCY LEINER MRN: [id number] Date of Birth: [birth year] No Data Recorded  Encounter Date: 12/12/2014      PT End of Session - 12/12/14 1020    Visit Number 7   Number of Visits 12   Date for PT Re-Evaluation 12/26/14   Authorization Type medicare   PT Start Time 0935   PT Stop Time 1020   PT Time Calculation (min) 45 min   Activity Tolerance Patient tolerated treatment well   Behavior During Therapy [facility] for tasks assessed/performed      Past Medical History  Diagnosis Date  . CAD (coronary artery disease) 08/2009    s/p PCI of the LAD  . Myocardial infarction     Non-st segment elevated  . Hypertension   . Hyperlipidemia   . Bradycardia   . Carotid artery stenosis   . COPD (chronic obstructive pulmonary disease)   . Chronic headache   . Asthma   . Pneumonia   . Ulnar neuropathy   . Seasonal allergies   . Impaired fasting glucose   . Dementia     patient denies this  . Osteoporosis 2009  . Acute on chronic diastolic heart failure 04/11/2014    Past Surgical History  Procedure Laterality Date  . Partial hysterectomy  1984  . Radial artery catheter      Bladder  . Bladder surgery  1991  . Cardiac catheterization  11/2010    negative  . Left heart catheterization with coronary angiogram N/A 11/05/2012    Procedure: LEFT HEART CATHETERIZATION WITH CORONARY ANGIOGRAM;  Surgeon: Micheline Chapman, MD;  Location: [facility] CATH LAB;  Service: Cardiovascular;  Laterality: N/A;    There were no vitals filed for this visit.  Visit Diagnosis:  Bilateral low back pain without sciatica  Difficulty walking up stairs  Abnormality of gait  Bilateral leg weakness      Subjective Assessment - 12/12/14 0934    Subjective PT states that her back was hurting to a 10/10; currently a  9/10    Currently in Pain? Yes   Pain Score 9             [facility] Adult PT Treatment/Exercise - 12/12/14 0937    Exercises   Exercises Lumbar   Lumbar Exercises: Stretches   Active Hamstring Stretch 3 reps;30 seconds   Active Hamstring Stretch Limitations --  supine   Single Knee to Chest Stretch 3 reps;20 seconds   Pelvic Tilt 5 reps;10 seconds   Piriformis Stretch 3 reps;30 seconds   Lumbar Exercises: Standing   Forward Lunge 10 reps   Side Lunge 10 reps   Other Standing Lumbar Exercises 3 D hip excursion x3   Other Standing Lumbar Exercises forearms on counter hip extensions x 5    Lumbar Exercises: Seated   Other Seated Lumbar Exercises B dorsiflexion while in good sitting posture; scapular retraction; Toe in/out isometrics all x 10    Lumbar Exercises: Supine   Ab Set 10 reps   Bent Knee Raise 10 reps   Bridge 10 reps   Straight Leg Raise 10 reps                PT Education - 12/12/14 1020    Education provided Yes   Education Details pelvic tilt; forearm hip extensors; sitting toe in and out  isometrics.          PT Short Term Goals - 12/08/14 1632    PT SHORT TERM GOAL #1   Title I HEP   Time 1   Period Weeks   Status On-going   PT SHORT TERM GOAL #2   Title Pt pain to be no greater than a 5/10   Time 2   Period Weeks   Status On-going   PT SHORT TERM GOAL #3   Title Pt to be able to sit with comfort for 30 mintues to be able to enjoy a meal    Time 2   Period Weeks   Status On-going   PT SHORT TERM GOAL #4   Title Pt to be able to walk with comfort for 15 mintues to be able to complete a light grocery strore trip    Time 2   Period Weeks   Status On-going   PT SHORT TERM GOAL #5   Title Pt to be able to stand with comfort for 30 mintues to make a meal    Time 2   Period Weeks           PT Long Term Goals - 12/08/14 1632    PT LONG TERM GOAL #1   Title Pt to be I in advance HEP   Time 4   Period Weeks   Status On-going   PT  LONG TERM GOAL #2   Title Pt pain level to be no greater than a 3/10   Time 4   Period Weeks   Status On-going   PT LONG TERM GOAL #3   Title Pt to be able to sit for an hour to be able to travel or eat a meal restaurant with comfort.    Time 4   Period Weeks   Status On-going   PT LONG TERM GOAL #4   Title Pt to be able to walk for 30 mintues with no increase pain to be able to complet grocery shopping in comfort.    Time 4   Period Weeks   Status On-going   PT LONG TERM GOAL #5   Title Pt to be able to SLS x 10 seconds B to reduce risk of falling    Time 4   Period Weeks               Plan - 12/12/14 1033    Clinical Impression Statement Ms. Brunjes had significant increased pain today therefore the majority of her treatment was completed non-weightbearing.  Pt had a difficult time grasping pellvic tilt exercise but was able to master.  Pt needs verbal and tactile cuing to complete stability exercises correctly.   Pt will benefit from skilled therapeutic intervention in order to improve on the following deficits Abnormal gait;Decreased activity tolerance;Decreased balance;Decreased range of motion;Decreased strength;Difficulty walking;Pain;Hypomobility;Increased fascial restricitons   PT Next Visit Plan continue with pelvic tilt and forearm hip extensions.        Problem List Patient Active Problem List   Diagnosis Date Noted  . Renal insufficiency 04/25/2014  . Acute on chronic diastolic heart failure (HCC) 04/11/2014  . Acute on chronic respiratory failure (HCC) 04/11/2014  . CKD (chronic kidney disease) stage 3, GFR 30-59 ml/min 04/11/2014  . Prediabetes 12/03/2013  . Hypertrophic obstructive cardiomyopathy (HCC) 11/09/2013  . Hypoxia 08/28/2013  . Respiratory failure with hypoxia (HCC) 05/07/2013  . Acute diastolic CHF (congestive heart failure) (HCC) 05/07/2013  . COPD exacerbation (HCC) 02/15/2013  .  Hyperlipidemia 11/09/2010  . Acute diastolic heart  failure (HCC) 29/56/2130  . CAD, NATIVE VESSEL 09/18/2009  . BRADYCARDIA 08/19/2009  . Dementia 07/19/2007  . Essential hypertension 07/19/2007  . CAROTID ARTERY STENOSIS 07/19/2007  . ASTHMA 07/19/2007  . COPD with asthma (HCC) 07/19/2007  . HEADACHE, CHRONIC, HX OF 07/19/2007    Virgina Organ, PT CLT [phone] 12/12/2014, 10:37 AM  [place] [facility] [address] [place], [place], [address] Phone: [phone]   Fax:  [phone]  Name: STANISHA LORENZ MRN: [id number] Date of Birth: 16-Apr-1935

## 2014-12-15 ENCOUNTER — Encounter: Payer: Self-pay | Admitting: Cardiovascular Disease

## 2014-12-15 ENCOUNTER — Ambulatory Visit (INDEPENDENT_AMBULATORY_CARE_PROVIDER_SITE_OTHER): Payer: Medicare Other | Admitting: Cardiovascular Disease

## 2014-12-15 ENCOUNTER — Ambulatory Visit (HOSPITAL_COMMUNITY): Payer: Medicare Other | Admitting: Physical Therapy

## 2014-12-15 VITALS — BP 150/60 | HR 54 | Ht 63.0 in | Wt 179.0 lb

## 2014-12-15 DIAGNOSIS — R29898 Other symptoms and signs involving the musculoskeletal system: Secondary | ICD-10-CM | POA: Diagnosis not present

## 2014-12-15 DIAGNOSIS — N183 Chronic kidney disease, stage 3 (moderate): Secondary | ICD-10-CM | POA: Diagnosis not present

## 2014-12-15 DIAGNOSIS — I129 Hypertensive chronic kidney disease with stage 1 through stage 4 chronic kidney disease, or unspecified chronic kidney disease: Secondary | ICD-10-CM

## 2014-12-15 DIAGNOSIS — I5032 Chronic diastolic (congestive) heart failure: Secondary | ICD-10-CM

## 2014-12-15 DIAGNOSIS — R262 Difficulty in walking, not elsewhere classified: Secondary | ICD-10-CM | POA: Diagnosis not present

## 2014-12-15 DIAGNOSIS — I6523 Occlusion and stenosis of bilateral carotid arteries: Secondary | ICD-10-CM | POA: Diagnosis not present

## 2014-12-15 DIAGNOSIS — R269 Unspecified abnormalities of gait and mobility: Secondary | ICD-10-CM | POA: Diagnosis not present

## 2014-12-15 DIAGNOSIS — M545 Low back pain, unspecified: Secondary | ICD-10-CM

## 2014-12-15 DIAGNOSIS — R293 Abnormal posture: Secondary | ICD-10-CM

## 2014-12-15 NOTE — Patient Instructions (Addendum)
Medication Instructions:  Your physician recommends that you continue on your current medications as directed. Please refer to the Current Medication list given to you today.  Labwork: No new orders.   Testing/Procedures: Your physician has requested that you have a carotid duplex. This test is an ultrasound of the carotid arteries in your neck. It looks at blood flow through these arteries that supply the brain with blood. Allow one hour for this exam. There are no restrictions or special instructions.  Follow-Up: Your physician wants you to follow-up in: 6 MONTHS with Dr Cooper.  You will receive a reminder letter in the mail two months in advance. If you don't receive a letter, please call our office to schedule the follow-up appointment.   Any Other Special Instructions Will Be Listed Below (If Applicable).     If you need a refill on your cardiac medications before your next appointment, please call your pharmacy.   

## 2014-12-15 NOTE — Therapy (Signed)
[facility] [address] [place], [place], [address] Phone: [phone]   Fax:  [phone]  Physical Therapy Treatment  Patient Details  Name: [name] MRN: [id number] Date of Birth: January 02, 1936 No Data Recorded  Encounter Date: 12/15/2014      PT End of Session - 12/15/14 1509    Visit Number 8   Number of Visits 12   Date for PT Re-Evaluation 12/26/14   Authorization Type medicare   PT Start Time 1435   PT Stop Time 1520   PT Time Calculation (min) 45 min   Activity Tolerance Patient tolerated treatment well   Behavior During Therapy [facility] for tasks assessed/performed      Past Medical History  Diagnosis Date  . CAD (coronary artery disease) 08/2009    s/p PCI of the LAD  . Myocardial infarction (HCC)     Non-st segment elevated  . Hypertension   . Hyperlipidemia   . Bradycardia   . Carotid artery stenosis   . COPD (chronic obstructive pulmonary disease) (HCC)   . Chronic headache   . Asthma   . Pneumonia   . Ulnar neuropathy   . Seasonal allergies   . Impaired fasting glucose   . Dementia     patient denies this  . Osteoporosis 2009  . Acute on chronic diastolic heart failure (HCC) 04/11/2014    Past Surgical History  Procedure Laterality Date  . Partial hysterectomy  1984  . Radial artery catheter      Bladder  . Bladder surgery  1991  . Cardiac catheterization  11/2010    negative  . Left heart catheterization with coronary angiogram N/A 11/05/2012    Procedure: LEFT HEART CATHETERIZATION WITH CORONARY ANGIOGRAM;  Surgeon: Micheline Chapman, MD;  Location: [facility] CATH LAB;  Service: Cardiovascular;  Laterality: N/A;    There were no vitals filed for this visit.  Visit Diagnosis:  Bilateral low back pain without sciatica  Difficulty walking up stairs  Abnormality of gait  Bilateral leg weakness  Poor posture      Subjective Assessment - 12/15/14 1450    Subjective Pt states she is doing  better now more stiffness than pain    Currently in Pain? Yes   Pain Score 3    Pain Location Back   Pain Orientation Lower   Pain Descriptors / Indicators Tightness   Pain Type Chronic pain   Pain Onset More than a month ago                    [facility] Adult PT Treatment/Exercise - 12/15/14 0001    Lumbar Exercises: Stretches   Active Hamstring Stretch 3 reps;30 seconds   Single Knee to Chest Stretch 3 reps;30 seconds   Pelvic Tilt 5 reps  x2   Lumbar Exercises: Standing   Heel Raises 10 reps   Functional Squats 10 reps   Functional Squats Limitations 3D hip excursion   Forward Lunge 10 reps   Side Lunge 10 reps   Other Standing Lumbar Exercises forearms on counter hip extensions x 10   Lumbar Exercises: Seated   Sit to Stand 10 reps   Lumbar Exercises: Supine   Bent Knee Raise 10 reps   Bridge 15 reps   Straight Leg Raise 15 reps   Lumbar Exercises: Sidelying   Hip Abduction 15 reps                PT Education - 12/15/14 1509  Education provided Yes   Education Details continued educataion on completing a pelvic tilt    Person(s) Educated Patient   Methods Explanation   Comprehension Verbalized understanding          PT Short Term Goals - 12/08/14 1632    PT SHORT TERM GOAL #1   Title I HEP   Time 1   Period Weeks   Status On-going   PT SHORT TERM GOAL #2   Title Pt pain to be no greater than a 5/10   Time 2   Period Weeks   Status On-going   PT SHORT TERM GOAL #3   Title Pt to be able to sit with comfort for 30 mintues to be able to enjoy a meal    Time 2   Period Weeks   Status On-going   PT SHORT TERM GOAL #4   Title Pt to be able to walk with comfort for 15 mintues to be able to complete a light grocery strore trip    Time 2   Period Weeks   Status On-going   PT SHORT TERM GOAL #5   Title Pt to be able to stand with comfort for 30 mintues to make a meal    Time 2   Period Weeks           PT Long Term Goals - 12/08/14  1632    PT LONG TERM GOAL #1   Title Pt to be I in advance HEP   Time 4   Period Weeks   Status On-going   PT LONG TERM GOAL #2   Title Pt pain level to be no greater than a 3/10   Time 4   Period Weeks   Status On-going   PT LONG TERM GOAL #3   Title Pt to be able to sit for an hour to be able to travel or eat a meal restaurant with comfort.    Time 4   Period Weeks   Status On-going   PT LONG TERM GOAL #4   Title Pt to be able to walk for 30 mintues with no increase pain to be able to complet grocery shopping in comfort.    Time 4   Period Weeks   Status On-going   PT LONG TERM GOAL #5   Title Pt to be able to SLS x 10 seconds B to reduce risk of falling    Time 4   Period Weeks               Plan - 12/15/14 1510    Clinical Impression Statement No new exercises were added today as therapist needed to focused on technique of exercises which is improving but continues to need significant verbal and manual cuing.    Pt will benefit from skilled therapeutic intervention in order to improve on the following deficits Abnormal gait;Decreased activity tolerance;Decreased balance;Decreased range of motion;Decreased strength;Difficulty walking;Pain;Hypomobility;Increased fascial restricitons   PT Treatment/Interventions ADLs/Self Care Home Management;Patient/family education;Manual techniques;Therapeutic activities;Therapeutic exercise;Balance training;Neuromuscular re-education   PT Next Visit Plan begin prone hip extension         Problem List Patient Active Problem List   Diagnosis Date Noted  . Renal insufficiency 04/25/2014  . Acute on chronic diastolic heart failure (HCC) 04/11/2014  . Acute on chronic respiratory failure (HCC) 04/11/2014  . CKD (chronic kidney disease) stage 3, GFR 30-59 ml/min 04/11/2014  . Prediabetes 12/03/2013  . Hypertrophic obstructive cardiomyopathy (HCC) 11/09/2013  . Hypoxia 08/28/2013  .  Respiratory failure with hypoxia (HCC)  05/07/2013  . Acute diastolic CHF (congestive heart failure) (HCC) 05/07/2013  . COPD exacerbation (HCC) 02/15/2013  . Hyperlipidemia 11/09/2010  . Acute diastolic heart failure (HCC) 10/15/2010  . CAD, NATIVE VESSEL 09/18/2009  . BRADYCARDIA 08/19/2009  . Dementia 07/19/2007  . Essential hypertension 07/19/2007  . CAROTID ARTERY STENOSIS 07/19/2007  . ASTHMA 07/19/2007  . COPD with asthma (HCC) 07/19/2007  . HEADACHE, CHRONIC, HX OF 07/19/2007    Virgina Organ, PT CLT [phone] 12/15/2014, 4:14 PM  [facility] [address] [place], [place], [address] Phone: [phone]   Fax:  [phone]  Name: [name] MRN: [id number] Date of Birth: [birth year]

## 2014-12-15 NOTE — Progress Notes (Signed)
Cardiology Office Note Date:  12/15/2014   ID:  [name], DOB [birth year], MRN [id number]  PCP:  Lilyan Punt, MD  Cardiologist:  Tonny Bollman, MD    Chief Complaint  Patient presents with  . Shortness of Breath   History of Present Illness: [name] is a 79 y.o. female who presents for follow-up evaluation. She has CAD and diastolic heart failure. She has coronary artery disease status post PCI of Heather LAD in 2011. She underwent cardiac catheterization in 2014 demonstrating patency of her stent site in Heather LAD. There was no other evidence of obstructive disease noted. Ventriculography was suggestive of apical hypertrophy. A cardiac MRI suggests a variant of hypertrophic cardiomyopathy as well. Heather patient has COPD and is now O2 dependent.  She primarily complains of shortness of breath with activity. Oxygen has helped but she's still symptomatic. Denies orthopnea, PND, or edema. No chest pain or pressure. She has been participating in rehab for her back problems. Chronic back pain has been a big problem for her.   Past Medical History  Diagnosis Date  . CAD (coronary artery disease) 08/2009    s/p PCI of Heather LAD  . Myocardial infarction (HCC)     Non-st segment elevated  . Hypertension   . Hyperlipidemia   . Bradycardia   . Carotid artery stenosis   . COPD (chronic obstructive pulmonary disease) (HCC)   . Chronic headache   . Asthma   . Pneumonia   . Ulnar neuropathy   . Seasonal allergies   . Impaired fasting glucose   . Dementia     patient denies this  . Osteoporosis 2009  . Acute on chronic diastolic heart failure (HCC) 04/11/2014    Past Surgical History  Procedure Laterality Date  . Partial hysterectomy  1984  . Radial artery catheter      Bladder  . Bladder surgery  1991  . Cardiac catheterization  11/2010    negative  . Left heart catheterization with coronary angiogram N/A 11/05/2012    Procedure: LEFT HEART CATHETERIZATION WITH CORONARY  ANGIOGRAM;  Surgeon: Micheline Chapman, MD;  Location: [facility] CATH LAB;  Service: Cardiovascular;  Laterality: N/A;    Current Outpatient Prescriptions  Medication Sig Dispense Refill  . albuterol (PROVENTIL HFA;VENTOLIN HFA) 108 (90 BASE) MCG/ACT inhaler Inhale 2 puffs into Heather lungs 3 (three) times daily. 1 Inhaler 3  . albuterol (PROVENTIL) (2.5 MG/3ML) 0.083% nebulizer solution Take 3 mLs (2.5 mg total) by nebulization every 4 (four) hours as needed for wheezing or shortness of breath. 75 mL 2  . alendronate (FOSAMAX) 70 MG tablet TAKE 1 TABLET BY MOUTH 1 TIME WEEKLY AS DIRECTED 12 tablet 0  . Alpha-D-Galactosidase (BEANO) TABS Take 2 tablets by mouth 3 times/day as needed-between meals & bedtime (gas).     Marland Kitchen amLODipine (NORVASC) 5 MG tablet Take 2.5 mg by mouth daily.    Marland Kitchen aspirin 81 MG EC tablet Take 81 mg by mouth every morning.     . budesonide-formoterol (SYMBICORT) 160-4.5 MCG/ACT inhaler Inhale 2 puffs into Heather lungs 2 (two) times daily. 1 Inhaler 12  . Calcium Carbonate (CALCIUM 600 PO) Take 1 tablet by mouth daily.     . calcium gluconate 500 MG tablet Take 1 tablet by mouth daily.    . Cholecalciferol (VITAMIN D3) 1000 UNITS CAPS Take 1 capsule by mouth daily.    . citalopram (CELEXA) 40 MG tablet TAKE 1 TABLET BY MOUTH EVERY MORNING 90 tablet 0  .  donepezil (ARICEPT) 10 MG tablet TAKE 1 TABLET BY MOUTH EVERY NIGHT AT BEDTIME 90 tablet 1  . erythromycin ophthalmic ointment USE IN Heather EYE AS DIRECTED 3.5 g 0  . furosemide (LASIX) 40 MG tablet Take 2 tablets every morning by mouth. 60 tablet 6  . gabapentin (NEURONTIN) 100 MG capsule Take 100 mg by mouth 3 (three) times daily.    Marland Kitchen LORazepam (ATIVAN) 1 MG tablet Take 1 mg by mouth at bedtime as needed for sleep (Take 1/2 to 1 tablet by mouth at bedtime as needed for sleep).    . losartan (COZAAR) 100 MG tablet TAKE 1 TABLET BY MOUTH EVERY DAY 90 tablet 0  . nitroGLYCERIN (NITROSTAT) 0.4 MG SL tablet Place 1 tablet (0.4 mg total) under  Heather tongue as needed for chest pain. 25 tablet 11  . NON FORMULARY Place 2 L into Heather nose continuous. O2    . polyethylene glycol (MIRALAX / GLYCOLAX) packet Take 17 g by mouth daily as needed for moderate constipation.    . potassium chloride (K-DUR,KLOR-CON) 10 MEQ tablet TAKE 1 TABLET(10 MEQ) BY MOUTH TWICE DAILY 60 tablet 5  . pravastatin (PRAVACHOL) 40 MG tablet TAKE 1 TABLET BY MOUTH EVERY DAY 90 tablet 0  . TRAVATAN Z 0.004 % SOLN ophthalmic solution Place 1 drop into Heather left eye at bedtime.     . valACYclovir (VALTREX) 1000 MG tablet Take 1 tablet by mouth 3 (three) times daily. Start taking 3 days before surgery  0   No current facility-administered medications for this visit.    Allergies:   Acetaminophen; Codeine; Dilaudid; Erythromycin; and Sulfonamide derivatives   Social History:  Heather patient  reports that she quit smoking about 6 years ago. She has never used smokeless tobacco. She reports that she does not drink alcohol or use illicit drugs.   Family History:  Heather patient's  family history includes Addison's disease in her mother; Alcohol abuse in her father.  ROS:  Please see Heather history of present illness.  Otherwise, review of systems is positive for hearing loss, visual disturbance, back pain, easy bruising.  All other systems are reviewed and negative.   PHYSICAL EXAM: VS:  BP 150/60 mmHg  Pulse 54  Ht  (1.6 m)  Wt 179 lb (81.194 kg)  BMI 31.72 kg/m2 , BMI Body mass index is 31.72 kg/(m^2). GEN: pleasant, obese elderly woman, in no acute distress HEENT: normal Neck: no JVD, no masses. Bilateral carotid bruits Cardiac: RRR without murmur or gallop                Respiratory:  clear to auscultation bilaterally, normal work of breathing GI: soft, nontender, nondistended, + BS MS: no deformity or atrophy Ext: no pretibial edema, pedal pulses 2+= bilaterally Skin: warm and dry, no rash Neuro:  Strength and sensation are intact Psych: euthymic mood, full  affect  EKG:  EKG is not ordered today.  Recent Labs: 06/07/2014: B Natriuretic Peptide 582.0* 06/30/2014: Magnesium 2.4* 08/13/2014: Hemoglobin 10.1*; Platelets 167.0 10/28/2014: ALT 14; BUN 31*; Creatinine, Ser 1.61*; Potassium 4.5; Sodium 142   Lipid Panel     Component Value Date/Time   CHOL 161 10/28/2014 0903   CHOL 133 12/06/2013 0728   TRIG 160* 10/28/2014 0903   HDL 51 10/28/2014 0903   HDL 33* 12/06/2013 0728   CHOLHDL 3.2 10/28/2014 0903   CHOLHDL 4.0 12/06/2013 0728   VLDL 31 12/06/2013 0728   LDLCALC 78 10/28/2014 0903   LDLCALC 69 12/06/2013  9163      Wt Readings from Last 3 Encounters:  12/15/14 179 lb (81.194 kg)  11/06/14 181 lb 6 oz (82.271 kg)  10/23/14 178 lb (80.74 kg)    ASSESSMENT AND PLAN: 1.  CAD, native vessel, without symptoms of angina: Heather patient will be continued on her current medical program.  2. Chronic diastolic heart failure, New [place] Heart Association Class 3: Shortness of breath is a combination of lung disease and diastolic heart failure. Heather patient is essentially stable but remains limited. She is now on home O2. Her medication program is reviewed and will be continued. She was reminded to take her diuretic regularly.  3. Carotid stenosis: Her carotid duplex scan will be updated  4. Hypertension with chronic kidney disease stage III: She remains on a combination of amlodipine and losartan. Her beta blocker had to be stopped because of marked bradycardia.  Current medicines are reviewed with Heather patient today.  Heather patient does not have concerns regarding medicines.  Labs/ tests ordered today include:  No orders of Heather defined types were placed in this encounter.    Disposition:   FU 6 months  Signed, Tonny Bollman, MD  12/15/2014 1:28 PM    [facility] [address] [place], [place], [place]  [address] Phone: [phone]; Fax: [phone]

## 2014-12-16 ENCOUNTER — Other Ambulatory Visit: Payer: Self-pay | Admitting: Cardiovascular Disease

## 2014-12-16 DIAGNOSIS — I6523 Occlusion and stenosis of bilateral carotid arteries: Secondary | ICD-10-CM

## 2014-12-16 HISTORY — PX: CATARACT EXTRACTION W/ INTRAOCULAR LENS  IMPLANT, BILATERAL: SHX1307

## 2014-12-17 ENCOUNTER — Ambulatory Visit (HOSPITAL_COMMUNITY): Payer: Medicare Other | Attending: Family Medicine | Admitting: Physical Therapy

## 2014-12-17 ENCOUNTER — Encounter (HOSPITAL_COMMUNITY): Payer: Medicare Other

## 2014-12-17 DIAGNOSIS — M545 Low back pain, unspecified: Secondary | ICD-10-CM

## 2014-12-17 DIAGNOSIS — R29898 Other symptoms and signs involving the musculoskeletal system: Secondary | ICD-10-CM | POA: Insufficient documentation

## 2014-12-17 DIAGNOSIS — R293 Abnormal posture: Secondary | ICD-10-CM

## 2014-12-17 DIAGNOSIS — R262 Difficulty in walking, not elsewhere classified: Secondary | ICD-10-CM | POA: Diagnosis not present

## 2014-12-17 DIAGNOSIS — R269 Unspecified abnormalities of gait and mobility: Secondary | ICD-10-CM | POA: Insufficient documentation

## 2014-12-17 NOTE — Therapy (Signed)
[facility] [address] [place], [place], [address] Phone: [phone]   Fax:  [phone]  Physical Therapy Treatment  Patient Details  Name: [name] MRN: [id number] Date of Birth: 1935/10/20 No Data Recorded  Encounter Date: 12/17/2014      PT End of Session - 12/17/14 1531    Visit Number 9   Number of Visits 18   Date for PT Re-Evaluation 12/26/14   Authorization Type medicare   PT Start Time 1435   PT Stop Time 1522   PT Time Calculation (min) 47 min   Activity Tolerance Patient tolerated treatment well      Past Medical History  Diagnosis Date  . CAD (coronary artery disease) 08/2009    s/p PCI of the LAD  . Myocardial infarction (HCC)     Non-st segment elevated  . Hypertension   . Hyperlipidemia   . Bradycardia   . Carotid artery stenosis   . COPD (chronic obstructive pulmonary disease) (HCC)   . Chronic headache   . Asthma   . Pneumonia   . Ulnar neuropathy   . Seasonal allergies   . Impaired fasting glucose   . Dementia     patient denies this  . Osteoporosis 2009  . Acute on chronic diastolic heart failure (HCC) 04/11/2014    Past Surgical History  Procedure Laterality Date  . Partial hysterectomy  1984  . Radial artery catheter      Bladder  . Bladder surgery  1991  . Cardiac catheterization  11/2010    negative  . Left heart catheterization with coronary angiogram N/A 11/05/2012    Procedure: LEFT HEART CATHETERIZATION WITH CORONARY ANGIOGRAM;  Surgeon: Micheline Chapman, MD;  Location: [facility] CATH LAB;  Service: Cardiovascular;  Laterality: N/A;    There were no vitals filed for this visit.  Visit Diagnosis:  Bilateral low back pain without sciatica  Difficulty walking up stairs  Abnormality of gait  Bilateral leg weakness  Poor posture      Subjective Assessment - 12/17/14 1436    Subjective [name] states that she mainly has stiffness now it is a lot better now than before  therapy.     Pertinent History OA, asthma, CHF, MI HNT , PVD, heart stent.    How long can you sit comfortably? Pt states she is able to sit for 30 minutes now.  She was shifting her weight immediatiely.   How long can you stand comfortably? Pt able to stand for 45 mintues now was 20.    How long can you walk comfortably? Pt is unable to walk due to her COPD    Currently in Pain? Yes   Pain Score 3    Pain Location Back   Pain Orientation Lower   Pain Descriptors / Indicators Tightness   Pain Type Chronic pain            [facility] PT Assessment - 12/17/14 0001    Assessment   Medical Diagnosis Low back Pain    Onset Date/Surgical Date 11/12/14   Prior Therapy N/a   Precautions   Precautions None   Restrictions   Weight Bearing Restrictions No   Balance Screen   Has the patient fallen in the past 6 months No   Home Environment   Living Environment Private residence   Prior Function   Level of Independence Independent   Cognition   Overall Cognitive Status Within Functional Limits for tasks assessed  Observation/Other Assessments   Focus on Therapeutic Outcomes (FOTO)  55 was 27   Functional Tests   Functional tests Single leg stance   Single Leg Stance   Comments Lt 2 seconds ; Rt 0    Posture/Postural Control   Posture/Postural Control Postural limitations   Postural Limitations Rounded Shoulders;Forward head;Decreased lumbar lordosis;Increased thoracic kyphosis   AROM   Lumbar Flexion fingers 8" past knee  was 2"   Lumbar Extension wnl was decreased 20 % with reps improving sx    Lumbar - Right Side Bend wfl no change of sx    Lumbar - Left Side Bend  wfl was decreased 25% with no change of sx    Strength   Right Hip Flexion 5/5  was 4+/5    Right Hip Extension 3+/5   Right Hip ABduction 5/5  was 4/5   Left Hip Flexion 4+/5  was 4/5   Left Hip Extension 3/5  was 3-   Left Hip ABduction 4/5  was 4-   Right Knee Flexion 5/5  was 4-/5    Right Knee Extension  5/5   Left Knee Flexion 5/5  was 4-/5    Left Knee Extension 5/5   Right Ankle Dorsiflexion 5/5   Left Ankle Dorsiflexion 5/5                     [facility] Adult PT Treatment/Exercise - 12/17/14 0001    Lumbar Exercises: Aerobic   Stationary Bike Nustep hills 3 level 3 x 8:00   Lumbar Exercises: Standing   Forward Lunge 10 reps   Side Lunge 10 reps   Other Standing Lumbar Exercises SLS x 3 each    Lumbar Exercises: Seated   Sit to Stand 10 reps   Lumbar Exercises: Supine   Bridge 15 reps   Lumbar Exercises: Quadruped   [place] 5 reps   Straight Leg Raise 5 reps                  PT Short Term Goals - 12/17/14 1454    PT SHORT TERM GOAL #1   Title I HEP   Time 1   Period Weeks   Status Achieved   PT SHORT TERM GOAL #2   Title Pt pain to be no greater than a 5/10   Time 2   Period Weeks   Status Achieved   PT SHORT TERM GOAL #3   Title Pt to be able to sit with comfort for 30 mintues to be able to enjoy a meal    Time 2   Period Weeks   Status Achieved   PT SHORT TERM GOAL #4   Title Pt to be able to walk with comfort for 15 mintues to be able to complete a light grocery strore trip    Time 2   Period Weeks   Status Achieved   PT SHORT TERM GOAL #5   Title Pt to be able to stand with comfort for 30 mintues to make a meal    Time 2   Period Weeks   Status On-going           PT Long Term Goals - 12/17/14 1455    PT LONG TERM GOAL #1   Title Pt to be I in advance HEP   Time 4   Period Weeks   Status Achieved   PT LONG TERM GOAL #2   Title Pt pain level to be no greater than a  3/10   Time 4   Period Weeks   Status On-going   PT LONG TERM GOAL #3   Title Pt to be able to sit for an hour to be able to travel or eat a meal restaurant with comfort.    Time 4   Period Weeks   Status Achieved   PT LONG TERM GOAL #4   Title Pt to be able to walk for 30 mintues with no increase pain to be able to complet grocery shopping in  comfort.    Time 4   Period Weeks   Status On-going   PT LONG TERM GOAL #5   Title Pt to be able to SLS x 10 seconds B to reduce risk of falling    Time 4   Period Weeks   Status On-going               Plan - 12-18-2014 1526    Clinical Impression Statement Pt reassessed with improvement in all areas.  Functional outcome score improved from 27 to 55.  Pt stil has weakness in proximal leg mm and significant decreased balance.  Pt will benefit from two additional weeks of therapy to maximize her functional potential.   Pt hip extension done on all 4's as pt COPD makes prone difficult to complete.    Pt will benefit from skilled therapeutic intervention in order to improve on the following deficits Abnormal gait;Decreased activity tolerance;Decreased balance;Decreased range of motion;Decreased strength;Difficulty walking;Pain;Hypomobility;Increased fascial restricitons   Rehab Potential Good   PT Frequency 3x / week   PT Duration 6 weeks  2 additional weeks    PT Treatment/Interventions ADLs/Self Care Home Management;Patient/family education;Manual techniques;Therapeutic activities;Therapeutic exercise;Balance training;Neuromuscular re-education   PT Next Visit Plan Begin quadriped single arm raise.           G-Codes - 12-18-2014 1530    Functional Limitation Other PT primary   Other PT Primary Current Status (Z6109) At least 40 percent but less than 60 percent impaired, limited or restricted   Other PT Primary Goal Status (U0454) At least 40 percent but less than 60 percent impaired, limited or restricted      Problem List Patient Active Problem List   Diagnosis Date Noted  . Renal insufficiency 04/25/2014  . Acute on chronic diastolic heart failure (HCC) 04/11/2014  . Acute on chronic respiratory failure (HCC) 04/11/2014  . CKD (chronic kidney disease) stage 3, GFR 30-59 ml/min 04/11/2014  . Prediabetes 12/03/2013  . Hypertrophic obstructive cardiomyopathy (HCC) 11/09/2013   . Hypoxia 08/28/2013  . Respiratory failure with hypoxia (HCC) 05/07/2013  . Acute diastolic CHF (congestive heart failure) (HCC) 05/07/2013  . COPD exacerbation (HCC) 02/15/2013  . Hyperlipidemia 11/09/2010  . Acute diastolic heart failure (HCC) 10/15/2010  . CAD, NATIVE VESSEL 09/18/2009  . BRADYCARDIA 08/19/2009  . Dementia 07/19/2007  . Essential hypertension 07/19/2007  . CAROTID ARTERY STENOSIS 07/19/2007  . ASTHMA 07/19/2007  . COPD with asthma (HCC) 07/19/2007  . HEADACHE, CHRONIC, HX OF 07/19/2007   Virgina Organ, PT CLT [phone] December 18, 2014, 3:33 PM  [facility] [address] [place], [place], [address] Phone: [phone]   Fax:  [phone]  Name: [name] MRN: [id number] Date of Birth: 1935-07-14   Physical Therapy Progress Note  Dates of Reporting Period: 0/01/2015 to 02/15/2014  Objective Reports of Subjective Statement: Pt states she is at least 25% better  Objective Measurements: see above  Goal Update: see above   Plan: continue  for two additional weeks for a total of 6 weeks for strengthening and balance   Reason Skilled Services are Required: PT balance places her at a risk for falls; gluteal mm are still weak.   Virgina Organ, PT CLT [phone]

## 2014-12-18 ENCOUNTER — Telehealth: Payer: Self-pay | Admitting: Family Medicine

## 2014-12-18 ENCOUNTER — Other Ambulatory Visit: Payer: Self-pay | Admitting: *Deleted

## 2014-12-18 ENCOUNTER — Other Ambulatory Visit: Payer: Self-pay | Admitting: Family Medicine

## 2014-12-18 ENCOUNTER — Inpatient Hospital Stay (HOSPITAL_COMMUNITY): Admission: RE | Admit: 2014-12-18 | Payer: Medicare Other | Source: Ambulatory Visit

## 2014-12-18 ENCOUNTER — Other Ambulatory Visit (HOSPITAL_COMMUNITY): Payer: Medicare Other

## 2014-12-18 ENCOUNTER — Ambulatory Visit (HOSPITAL_COMMUNITY)
Admission: RE | Admit: 2014-12-18 | Discharge: 2014-12-18 | Disposition: A | Discharge status: Home / Self Care | Payer: Medicare Other | Source: Ambulatory Visit | Attending: Cardiovascular Disease | Admitting: Cardiovascular Disease

## 2014-12-18 DIAGNOSIS — I2510 Atherosclerotic heart disease of native coronary artery without angina pectoris: Secondary | ICD-10-CM | POA: Insufficient documentation

## 2014-12-18 DIAGNOSIS — J449 Chronic obstructive pulmonary disease, unspecified: Secondary | ICD-10-CM | POA: Diagnosis not present

## 2014-12-18 DIAGNOSIS — E785 Hyperlipidemia, unspecified: Secondary | ICD-10-CM | POA: Diagnosis not present

## 2014-12-18 DIAGNOSIS — I6523 Occlusion and stenosis of bilateral carotid arteries: Secondary | ICD-10-CM | POA: Diagnosis not present

## 2014-12-18 DIAGNOSIS — I252 Old myocardial infarction: Secondary | ICD-10-CM | POA: Insufficient documentation

## 2014-12-18 DIAGNOSIS — Z87891 Personal history of nicotine dependence: Secondary | ICD-10-CM | POA: Insufficient documentation

## 2014-12-18 DIAGNOSIS — I10 Essential (primary) hypertension: Secondary | ICD-10-CM | POA: Diagnosis not present

## 2014-12-18 MED ORDER — NITROGLYCERIN 0.4 MG SL SUBL: 0.4000 mg | SUBLINGUAL_TABLET | SUBLINGUAL | Status: AC | PRN

## 2014-12-18 NOTE — Telephone Encounter (Signed)
Originally prescribed by [name] NP.

## 2014-12-18 NOTE — Telephone Encounter (Signed)
Best ref by card but since just saw may ref

## 2014-12-18 NOTE — Telephone Encounter (Signed)
Requesting Rx for nitroglycerin .4 mg SL tablet    [facility]

## 2014-12-18 NOTE — Telephone Encounter (Signed)
Med sent to pharm. Pt's daughter notified.  

## 2014-12-19 ENCOUNTER — Telehealth: Payer: Self-pay | Admitting: Cardiovascular Disease

## 2014-12-19 ENCOUNTER — Ambulatory Visit (HOSPITAL_COMMUNITY): Payer: Medicare Other | Admitting: Physical Therapy

## 2014-12-19 DIAGNOSIS — R29898 Other symptoms and signs involving the musculoskeletal system: Secondary | ICD-10-CM | POA: Diagnosis not present

## 2014-12-19 DIAGNOSIS — R269 Unspecified abnormalities of gait and mobility: Secondary | ICD-10-CM | POA: Diagnosis not present

## 2014-12-19 DIAGNOSIS — R262 Difficulty in walking, not elsewhere classified: Secondary | ICD-10-CM

## 2014-12-19 DIAGNOSIS — M545 Low back pain, unspecified: Secondary | ICD-10-CM

## 2014-12-19 DIAGNOSIS — R293 Abnormal posture: Secondary | ICD-10-CM | POA: Diagnosis not present

## 2014-12-19 NOTE — Therapy (Signed)
[facility] [address] [place], [place], [address] Phone: [phone]   Fax:  [phone]  Physical Therapy Treatment  Patient Details  Name: [name] MRN: [id number] Date of Birth: 1935-07-20 No Data Recorded  Encounter Date: 12/19/2014      PT End of Session - 12/19/14 1150    Visit Number 10   Number of Visits 18   Date for PT Re-Evaluation 12/26/14   Authorization Type medicare   Authorization Time Period G-codes done 9th session    Authorization - Visit Number 9   Authorization - Number of Visits 19   PT Start Time 1100   PT Stop Time 1142   PT Time Calculation (min) 42 min   Activity Tolerance Patient tolerated treatment well   Behavior During Therapy [facility] for tasks assessed/performed      Past Medical History  Diagnosis Date  . CAD (coronary artery disease) 08/2009    s/p PCI of the LAD  . Myocardial infarction (HCC)     Non-st segment elevated  . Hypertension   . Hyperlipidemia   . Bradycardia   . Carotid artery stenosis   . COPD (chronic obstructive pulmonary disease) (HCC)   . Chronic headache   . Asthma   . Pneumonia   . Ulnar neuropathy   . Seasonal allergies   . Impaired fasting glucose   . Dementia     patient denies this  . Osteoporosis 2009  . Acute on chronic diastolic heart failure (HCC) 04/11/2014    Past Surgical History  Procedure Laterality Date  . Partial hysterectomy  1984  . Radial artery catheter      Bladder  . Bladder surgery  1991  . Cardiac catheterization  11/2010    negative  . Left heart catheterization with coronary angiogram N/A 11/05/2012    Procedure: LEFT HEART CATHETERIZATION WITH CORONARY ANGIOGRAM;  Surgeon: Micheline Chapman, MD;  Location: [facility] CATH LAB;  Service: Cardiovascular;  Laterality: N/A;    There were no vitals filed for this visit.  Visit Diagnosis:  Bilateral low back pain without sciatica  Difficulty walking up stairs  Abnormality of  gait  Bilateral leg weakness  Poor posture      Subjective Assessment - 12/19/14 1100    Subjective Patient reports that she is only having mild pain this morning; feels that she has really improved quite a bit with PT    Pertinent History OA, asthma, CHF, MI HNT , PVD, heart stent.    Currently in Pain? Yes   Pain Score 2    Pain Location Back   Pain Orientation Lower                         [facility] Adult PT Treatment/Exercise - 12/19/14 0001    Lumbar Exercises: Stretches   Active Hamstring Stretch 3 reps;30 seconds   Active Hamstring Stretch Limitations 12" step   Passive Hamstring Stretch 3 reps;30 seconds   Passive Hamstring Stretch Limitations slant board   Single Knee to Chest Stretch 5 reps;10 seconds   Lower Trunk Rotation 5 reps;10 seconds   Pelvic Tilt --   Piriformis Stretch 30 seconds;2 reps   Piriformis Stretch Limitations seated   Lumbar Exercises: Standing   Heel Raises 15 reps   Heel Raises Limitations toe and heel raises    Forward Lunge 10 reps   Forward Lunge Limitations 4 inch box    Other Standing Lumbar Exercises  3D hip excursions 1x10; SLS x3 each LE, intermittent HHA    Other Standing Lumbar Exercises step ups on 4 inch box 1x15    Lumbar Exercises: Supine   Ab Set 15 reps   Bridge 15 reps   Straight Leg Raise 15 reps   Other Supine Lumbar Exercises dead bugs 1x10   Lumbar Exercises: Quadruped   Madcat/Old Horse 5 reps   Single Arm Raise 5 reps   Straight Leg Raise 5 reps                PT Education - 12/19/14 1149    Education provided No          PT Short Term Goals - 12/17/14 1454    PT SHORT TERM GOAL #1   Title I HEP   Time 1   Period Weeks   Status Achieved   PT SHORT TERM GOAL #2   Title Pt pain to be no greater than a 5/10   Time 2   Period Weeks   Status Achieved   PT SHORT TERM GOAL #3   Title Pt to be able to sit with comfort for 30 mintues to be able to enjoy a meal    Time 2   Period Weeks    Status Achieved   PT SHORT TERM GOAL #4   Title Pt to be able to walk with comfort for 15 mintues to be able to complete a light grocery strore trip    Time 2   Period Weeks   Status Achieved   PT SHORT TERM GOAL #5   Title Pt to be able to stand with comfort for 30 mintues to make a meal    Time 2   Period Weeks   Status On-going           PT Long Term Goals - 12/17/14 1455    PT LONG TERM GOAL #1   Title Pt to be I in advance HEP   Time 4   Period Weeks   Status Achieved   PT LONG TERM GOAL #2   Title Pt pain level to be no greater than a 3/10   Time 4   Period Weeks   Status On-going   PT LONG TERM GOAL #3   Title Pt to be able to sit for an hour to be able to travel or eat a meal restaurant with comfort.    Time 4   Period Weeks   Status Achieved   PT LONG TERM GOAL #4   Title Pt to be able to walk for 30 mintues with no increase pain to be able to complet grocery shopping in comfort.    Time 4   Period Weeks   Status On-going   PT LONG TERM GOAL #5   Title Pt to be able to SLS x 10 seconds B to reduce risk of falling    Time 4   Period Weeks   Status On-going               Plan - 12/19/14 1151    Clinical Impression Statement Continued focus on functional exercises in supine and standing today, also introduced straight arm raise with quadruped activities although patient did express fatigue with this as evidenced by excessive weight shift posteriorly to take pressure off of stance UE. Patient appeared to tolerate  today's session well but did appear fatigued at end of session.    Pt will benefit from skilled therapeutic intervention in  order to improve on the following deficits Abnormal gait;Decreased activity tolerance;Decreased balance;Decreased range of motion;Decreased strength;Difficulty walking;Pain;Hypomobility;Increased fascial restricitons   Rehab Potential Good   PT Frequency 3x / week   PT Duration 6 weeks   PT Treatment/Interventions  ADLs/Self Care Home Management;Patient/family education;Manual techniques;Therapeutic activities;Therapeutic exercise;Balance training;Neuromuscular re-education   PT Next Visit Plan continue quadruped activities, incorporate more balance work    Consulted and Agree with Plan of Care Patient        Problem List Patient Active Problem List   Diagnosis Date Noted  . Renal insufficiency 04/25/2014  . Acute on chronic diastolic heart failure (HCC) 04/11/2014  . Acute on chronic respiratory failure (HCC) 04/11/2014  . CKD (chronic kidney disease) stage 3, GFR 30-59 ml/min 04/11/2014  . Prediabetes 12/03/2013  . Hypertrophic obstructive cardiomyopathy (HCC) 11/09/2013  . Hypoxia 08/28/2013  . Respiratory failure with hypoxia (HCC) 05/07/2013  . Acute diastolic CHF (congestive heart failure) (HCC) 05/07/2013  . COPD exacerbation (HCC) 02/15/2013  . Hyperlipidemia 11/09/2010  . Acute diastolic heart failure (HCC) 10/15/2010  . CAD, NATIVE VESSEL 09/18/2009  . BRADYCARDIA 08/19/2009  . Dementia 07/19/2007  . Essential hypertension 07/19/2007  . CAROTID ARTERY STENOSIS 07/19/2007  . ASTHMA 07/19/2007  . COPD with asthma (HCC) 07/19/2007  . HEADACHE, CHRONIC, HX OF 07/19/2007    Nedra Hai PT, DPT [phone]  [facility] [address] [place], [place], [address] Phone: [phone]   Fax:  [phone]  Name: [name] MRN: [id number] Date of Birth: 07/25/1935

## 2014-12-19 NOTE — Telephone Encounter (Signed)
New Message    Pt's daughter returning Lauren's phone call for carotid results.

## 2014-12-19 NOTE — Telephone Encounter (Signed)
Pam's name is not on DPR.    I spoke with Misty Stanley the pt's grand-daughter and made her aware of carotid results.

## 2014-12-22 ENCOUNTER — Ambulatory Visit (HOSPITAL_COMMUNITY): Payer: Medicare Other | Admitting: Physical Therapy

## 2014-12-22 ENCOUNTER — Other Ambulatory Visit: Payer: Self-pay | Admitting: Family Medicine

## 2014-12-22 DIAGNOSIS — R262 Difficulty in walking, not elsewhere classified: Secondary | ICD-10-CM | POA: Diagnosis not present

## 2014-12-22 DIAGNOSIS — R29898 Other symptoms and signs involving the musculoskeletal system: Secondary | ICD-10-CM | POA: Diagnosis not present

## 2014-12-22 DIAGNOSIS — R293 Abnormal posture: Secondary | ICD-10-CM

## 2014-12-22 DIAGNOSIS — R269 Unspecified abnormalities of gait and mobility: Secondary | ICD-10-CM | POA: Diagnosis not present

## 2014-12-22 DIAGNOSIS — M545 Low back pain, unspecified: Secondary | ICD-10-CM

## 2014-12-22 NOTE — Therapy (Signed)
[facility] [address] [place], [place], [address] Phone: [phone]   Fax:  [phone]  Physical Therapy Treatment  Patient Details  Name: [name] MRN: [id number] Date of Birth: 10-Jun-1935 No Data Recorded  Encounter Date: 12/22/2014      PT End of Session - 12/22/14 1130    Visit Number 11   Number of Visits 18   Date for PT Re-Evaluation 12/26/14   Authorization Type medicare   Authorization Time Period G-codes done 9th session    Authorization - Visit Number 10   Authorization - Number of Visits 18   PT Start Time 1100   PT Stop Time 1143   PT Time Calculation (min) 43 min   Equipment Utilized During Treatment Gait belt      Past Medical History  Diagnosis Date  . CAD (coronary artery disease) 08/2009    s/p PCI of the LAD  . Myocardial infarction (HCC)     Non-st segment elevated  . Hypertension   . Hyperlipidemia   . Bradycardia   . Carotid artery stenosis   . COPD (chronic obstructive pulmonary disease) (HCC)   . Chronic headache   . Asthma   . Pneumonia   . Ulnar neuropathy   . Seasonal allergies   . Impaired fasting glucose   . Dementia     patient denies this  . Osteoporosis 2009  . Acute on chronic diastolic heart failure (HCC) 04/11/2014    Past Surgical History  Procedure Laterality Date  . Partial hysterectomy  1984  . Radial artery catheter      Bladder  . Bladder surgery  1991  . Cardiac catheterization  11/2010    negative  . Left heart catheterization with coronary angiogram N/A 11/05/2012    Procedure: LEFT HEART CATHETERIZATION WITH CORONARY ANGIOGRAM;  Surgeon: Micheline Chapman, MD;  Location: [facility] CATH LAB;  Service: Cardiovascular;  Laterality: N/A;    There were no vitals filed for this visit.  Visit Diagnosis:  Bilateral low back pain without sciatica  Difficulty walking up stairs  Abnormality of gait  Bilateral leg weakness  Poor posture      Subjective  Assessment - 12/22/14 1117    Subjective Pt has stiffness no pain.    Currently in Pain? No/denies                         [facility] Adult PT Treatment/Exercise - 12/22/14 0001    Lumbar Exercises: Stretches   Active Hamstring Stretch 3 reps;30 seconds   Active Hamstring Stretch Limitations supine   Single Knee to Chest Stretch 5 reps;10 seconds   Lower Trunk Rotation 5 reps;10 seconds   Lumbar Exercises: Standing   Forward Lunge 10 reps   Side Lunge 10 reps   Other Standing Lumbar Exercises tandem gt on balance beam x 1 RT without beam x 1 RT    Lumbar Exercises: Supine   Bridge 10 reps   Bridge Limitations on ball   Large Ball Abdominal Isometric 10 reps   Other Supine Lumbar Exercises dead bugs 1x10   Lumbar Exercises: Sidelying   Hip Abduction 10 reps   Hip Abduction Weights (lbs) 4#   Lumbar Exercises: Quadruped   Madcat/Old Horse 5 reps   Single Arm Raise 10 reps   Straight Leg Raise 5 reps                  PT Short Term  Goals - 12/17/14 1454    PT SHORT TERM GOAL #1   Title I HEP   Time 1   Period Weeks   Status Achieved   PT SHORT TERM GOAL #2   Title Pt pain to be no greater than a 5/10   Time 2   Period Weeks   Status Achieved   PT SHORT TERM GOAL #3   Title Pt to be able to sit with comfort for 30 mintues to be able to enjoy a meal    Time 2   Period Weeks   Status Achieved   PT SHORT TERM GOAL #4   Title Pt to be able to walk with comfort for 15 mintues to be able to complete a light grocery strore trip    Time 2   Period Weeks   Status Achieved   PT SHORT TERM GOAL #5   Title Pt to be able to stand with comfort for 30 mintues to make a meal    Time 2   Period Weeks   Status On-going           PT Long Term Goals - 12/17/14 1455    PT LONG TERM GOAL #1   Title Pt to be I in advance HEP   Time 4   Period Weeks   Status Achieved   PT LONG TERM GOAL #2   Title Pt pain level to be no greater than a 3/10   Time 4    Period Weeks   Status On-going   PT LONG TERM GOAL #3   Title Pt to be able to sit for an hour to be able to travel or eat a meal restaurant with comfort.    Time 4   Period Weeks   Status Achieved   PT LONG TERM GOAL #4   Title Pt to be able to walk for 30 mintues with no increase pain to be able to complet grocery shopping in comfort.    Time 4   Period Weeks   Status On-going   PT LONG TERM GOAL #5   Title Pt to be able to SLS x 10 seconds B to reduce risk of falling    Time 4   Period Weeks   Status On-going               Plan - 12/22/14 1131    Clinical Impression Statement Added tandem gt with and without balance beam with moderate assist needed.  Added ball to bridging to increase stability as well as abdominal isometric for improved core contraction.  Pt progressing in quadriped exercises with improved tolerance noted    PT Next Visit Plan continue quadruped activities, incorporate more balance work         Problem List Patient Active Problem List   Diagnosis Date Noted  . Renal insufficiency 04/25/2014  . Acute on chronic diastolic heart failure (HCC) 04/11/2014  . Acute on chronic respiratory failure (HCC) 04/11/2014  . CKD (chronic kidney disease) stage 3, GFR 30-59 ml/min 04/11/2014  . Prediabetes 12/03/2013  . Hypertrophic obstructive cardiomyopathy (HCC) 11/09/2013  . Hypoxia 08/28/2013  . Respiratory failure with hypoxia (HCC) 05/07/2013  . Acute diastolic CHF (congestive heart failure) (HCC) 05/07/2013  . COPD exacerbation (HCC) 02/15/2013  . Hyperlipidemia 11/09/2010  . Acute diastolic heart failure (HCC) 10/15/2010  . CAD, NATIVE VESSEL 09/18/2009  . BRADYCARDIA 08/19/2009  . Dementia 07/19/2007  . Essential hypertension 07/19/2007  . CAROTID ARTERY STENOSIS 07/19/2007  .  ASTHMA 07/19/2007  . COPD with asthma (HCC) 07/19/2007  . HEADACHE, CHRONIC, HX OF 07/19/2007    Virgina Organ, PT CLT [phone] 12/22/2014, 11:45 AM  [facility] [address] [place], [place], [address] Phone: [phone]   Fax:  [phone]  Name: [name] MRN: [id number] Date of Birth: [birth year]

## 2014-12-24 ENCOUNTER — Ambulatory Visit (HOSPITAL_COMMUNITY): Payer: Medicare Other | Admitting: Physical Therapy

## 2014-12-24 DIAGNOSIS — R269 Unspecified abnormalities of gait and mobility: Secondary | ICD-10-CM

## 2014-12-24 DIAGNOSIS — R29898 Other symptoms and signs involving the musculoskeletal system: Secondary | ICD-10-CM | POA: Diagnosis not present

## 2014-12-24 DIAGNOSIS — M545 Low back pain, unspecified: Secondary | ICD-10-CM

## 2014-12-24 DIAGNOSIS — R293 Abnormal posture: Secondary | ICD-10-CM | POA: Diagnosis not present

## 2014-12-24 DIAGNOSIS — R262 Difficulty in walking, not elsewhere classified: Secondary | ICD-10-CM

## 2014-12-24 NOTE — Therapy (Signed)
[facility] [address] [place], [place], [address] Phone: [phone]   Fax:  [phone]  Physical Therapy Treatment  Patient Details  Name: [name] MRN: [id number] Date of Birth: 1935/04/12 No Data Recorded  Encounter Date: 12/24/2014      PT End of Session - 12/24/14 1205    Visit Number 12   Number of Visits 18   Date for PT Re-Evaluation 12/26/14   Authorization Type medicare   Authorization Time Period G-codes done 9th session    Authorization - Visit Number 12   Authorization - Number of Visits 18   PT Start Time 1110   PT Stop Time 1200   PT Time Calculation (min) 50 min   Equipment Utilized During Treatment Gait belt   Activity Tolerance Patient tolerated treatment well   Behavior During Therapy [facility] for tasks assessed/performed      Past Medical History  Diagnosis Date  . CAD (coronary artery disease) 08/2009    s/p PCI of the LAD  . Myocardial infarction (HCC)     Non-st segment elevated  . Hypertension   . Hyperlipidemia   . Bradycardia   . Carotid artery stenosis   . COPD (chronic obstructive pulmonary disease) (HCC)   . Chronic headache   . Asthma   . Pneumonia   . Ulnar neuropathy   . Seasonal allergies   . Impaired fasting glucose   . Dementia     patient denies this  . Osteoporosis 2009  . Acute on chronic diastolic heart failure (HCC) 04/11/2014    Past Surgical History  Procedure Laterality Date  . Partial hysterectomy  1984  . Radial artery catheter      Bladder  . Bladder surgery  1991  . Cardiac catheterization  11/2010    negative  . Left heart catheterization with coronary angiogram N/A 11/05/2012    Procedure: LEFT HEART CATHETERIZATION WITH CORONARY ANGIOGRAM;  Surgeon: Micheline Chapman, MD;  Location: [facility] CATH LAB;  Service: Cardiovascular;  Laterality: N/A;    There were no vitals filed for this visit.  Visit Diagnosis:  Bilateral low back pain without  sciatica  Difficulty walking up stairs  Abnormality of gait  Bilateral leg weakness  Poor posture      Subjective Assessment - 12/24/14 1121    Subjective Pt states her back hurt some yesterday morning when she was trying to cleaning so she stopped and rested and it got better. Currently with minimal discomfort of 2/10 in central LB   Currently in Pain? Yes   Pain Score 2    Pain Location Back   Pain Orientation Lower;Mid            [facility] PT Assessment - 12/24/14 0001    Assessment   Medical Diagnosis Low back Pain    Onset Date/Surgical Date 11/12/14                     [facility] Adult PT Treatment/Exercise - 12/24/14 1111    Lumbar Exercises: Stretches   Active Hamstring Stretch 3 reps;30 seconds   Active Hamstring Stretch Limitations 12" step   Passive Hamstring Stretch 3 reps;30 seconds   Passive Hamstring Stretch Limitations slant board   Lumbar Exercises: Seated   Sit to Stand 5 reps   Lumbar Exercises: Supine   Bridge 15 reps   Bridge Limitations on ball   Straight Leg Raise 15 reps   Lumbar Exercises: Quadruped   Madcat/Old Horse  5 reps   Single Arm Raise 10 reps   Straight Leg Raise 10 reps             Balance Exercises - 12/24/14 1120    Balance Exercises: Standing   Tandem Gait Forward;1 rep   Retro Gait 1 rep   Sidestepping 1 rep             PT Short Term Goals - 12/17/14 1454    PT SHORT TERM GOAL #1   Title I HEP   Time 1   Period Weeks   Status Achieved   PT SHORT TERM GOAL #2   Title Pt pain to be no greater than a 5/10   Time 2   Period Weeks   Status Achieved   PT SHORT TERM GOAL #3   Title Pt to be able to sit with comfort for 30 mintues to be able to enjoy a meal    Time 2   Period Weeks   Status Achieved   PT SHORT TERM GOAL #4   Title Pt to be able to walk with comfort for 15 mintues to be able to complete a light grocery strore trip    Time 2   Period Weeks   Status Achieved   PT SHORT TERM GOAL #5    Title Pt to be able to stand with comfort for 30 mintues to make a meal    Time 2   Period Weeks   Status On-going           PT Long Term Goals - 12/17/14 1455    PT LONG TERM GOAL #1   Title Pt to be I in advance HEP   Time 4   Period Weeks   Status Achieved   PT LONG TERM GOAL #2   Title Pt pain level to be no greater than a 3/10   Time 4   Period Weeks   Status On-going   PT LONG TERM GOAL #3   Title Pt to be able to sit for an hour to be able to travel or eat a meal restaurant with comfort.    Time 4   Period Weeks   Status Achieved   PT LONG TERM GOAL #4   Title Pt to be able to walk for 30 mintues with no increase pain to be able to complet grocery shopping in comfort.    Time 4   Period Weeks   Status On-going   PT LONG TERM GOAL #5   Title Pt to be able to SLS x 10 seconds B to reduce risk of falling    Time 4   Period Weeks   Status On-going               Plan - 12/24/14 1212    Clinical Impression Statement Continued with balance activities to improve overall stabililtiy.  Added retro gait and sidestepping actvities with cues for posturing.  Pt continues to require multimodal cues to complete therex in correct form and stabilitzation.  Pt without c/o pain at end of session.    PT Next Visit Plan continue quadruped activities, incorporate more balance work.  disuss need to continue X weeks or discharge to HEP.         Problem List Patient Active Problem List   Diagnosis Date Noted  . Renal insufficiency 04/25/2014  . Acute on chronic diastolic heart failure (HCC) 04/11/2014  . Acute on chronic respiratory failure (HCC) 04/11/2014  . CKD (  chronic kidney disease) stage 3, GFR 30-59 ml/min 04/11/2014  . Prediabetes 12/03/2013  . Hypertrophic obstructive cardiomyopathy (HCC) 11/09/2013  . Hypoxia 08/28/2013  . Respiratory failure with hypoxia (HCC) 05/07/2013  . Acute diastolic CHF (congestive heart failure) (HCC) 05/07/2013  . COPD  exacerbation (HCC) 02/15/2013  . Hyperlipidemia 11/09/2010  . Acute diastolic heart failure (HCC) 10/15/2010  . CAD, NATIVE VESSEL 09/18/2009  . BRADYCARDIA 08/19/2009  . Dementia 07/19/2007  . Essential hypertension 07/19/2007  . CAROTID ARTERY STENOSIS 07/19/2007  . ASTHMA 07/19/2007  . COPD with asthma (HCC) 07/19/2007  . HEADACHE, CHRONIC, HX OF 07/19/2007    Lurena Nida, PTA/CLT [phone]  12/24/2014, 12:14 PM  [facility] [address] [place], [place], [address] Phone: [phone]   Fax:  [phone]  Name: [name] MRN: [id number] Date of Birth: [birth year]

## 2014-12-26 ENCOUNTER — Ambulatory Visit (HOSPITAL_COMMUNITY): Payer: Medicare Other

## 2014-12-26 DIAGNOSIS — R262 Difficulty in walking, not elsewhere classified: Secondary | ICD-10-CM

## 2014-12-26 DIAGNOSIS — R293 Abnormal posture: Secondary | ICD-10-CM

## 2014-12-26 DIAGNOSIS — M545 Low back pain, unspecified: Secondary | ICD-10-CM

## 2014-12-26 DIAGNOSIS — R29898 Other symptoms and signs involving the musculoskeletal system: Secondary | ICD-10-CM | POA: Diagnosis not present

## 2014-12-26 DIAGNOSIS — R269 Unspecified abnormalities of gait and mobility: Secondary | ICD-10-CM

## 2014-12-26 NOTE — Therapy (Signed)
[place], [place], [address] Phone: [phone]   Fax:  [phone]  Physical Therapy Treatment  Patient Details  Name: [name] MRN: [id number] Date of Birth: 10/08/1935 Referring Provider: Sallee Lange  Encounter Date: 12/26/2014      PT End of Session - 12/26/14 1134    Visit Number 13   Number of Visits 18   Date for PT Re-Evaluation 12/26/14   Authorization Type medicare   Authorization Time Period G-codes done 9th session    Authorization - Visit Number 13   Authorization - Number of Visits 18   PT Start Time 1410   PT Stop Time 1133   PT Time Calculation (min) 28 min   Activity Tolerance Patient tolerated treatment well   Behavior During Therapy [facility] for tasks assessed/performed      Past Medical History  Diagnosis Date  . CAD (coronary artery disease) 08/2009    s/p PCI of the LAD  . Myocardial infarction ([place])     Non-st segment elevated  . Hypertension   . Hyperlipidemia   . Bradycardia   . Carotid artery stenosis   . COPD (chronic obstructive pulmonary disease) ([place])   . Chronic headache   . Asthma   . Pneumonia   . Ulnar neuropathy   . Seasonal allergies   . Impaired fasting glucose   . Dementia     patient denies this  . Osteoporosis 2009  . Acute on chronic diastolic heart failure ([place]) 04/11/2014    Past Surgical History  Procedure Laterality Date  . Partial hysterectomy  1984  . Radial artery catheter      Bladder  . Bladder surgery  1991  . Cardiac catheterization  11/2010    negative  . Left heart catheterization with coronary angiogram N/A 11/05/2012    Procedure: LEFT HEART CATHETERIZATION WITH CORONARY ANGIOGRAM;  Surgeon: Blane Ohara, MD;  Location: [facility] CATH LAB;  Service: Cardiovascular;  Laterality: N/A;    There were no vitals filed for this visit.  Visit Diagnosis:  Bilateral low back pain without sciatica  Difficulty walking up  stairs  Abnormality of gait  Bilateral leg weakness  Poor posture      Subjective Assessment - 12/26/14 1109    Subjective Pt reports she has been feel;ing bette roverall, painfree today. She is happy with her progress. HEP is going well.    Pertinent History OA, asthma, CHF, MI HNT , PVD, heart stent.    How long can you sit comfortably? Pt states she is able to sit for 30 minutes now.  She was shifting her weight immediatiely.   How long can you stand comfortably? Pt able to stand for 45 mintues now; was 20 at evaluation.    How long can you walk comfortably? Short shopping trips, within the house, mostly limited by COPD.    Currently in Pain? No/denies            [facility] PT Assessment - 12/26/14 0001    Assessment   Medical Diagnosis Low back Pain    Referring Provider Scott Luking   Onset Date/Surgical Date 11/12/14   Hand Dominance Right   Prior Therapy N/a   Precautions   Precautions None   Restrictions   Weight Bearing Restrictions No   Balance Screen   Has the patient fallen in the past 6 months No   Has the patient had a decrease in activity level because of a fear  of falling?  Yes   Is the patient reluctant to leave their home because of a fear of falling?  No   Prior Function   Level of Independence Independent   Cognition   Overall Cognitive Status Within Functional Limits for tasks assessed   Functional Tests   Functional tests Single leg stance   Single Leg Stance   Comments 2 seconds bilat   AROM   Lumbar Flexion continued flat back with forward flexion,  making progress as of 11/2   Lumbar Extension wnl was decreased 20 % with reps improving sx    Strength   Right Hip Flexion 5/5  was 4+/5    Right Hip Extension 4+/5  limited range   Right Hip ABduction 5/5  was 4/5   Left Hip Flexion 5/5   Left Hip Extension 4/5  limited range   Left Hip ABduction 5/5   Right Knee Flexion 5/5  was 4-/5    Right Knee Extension 5/5   Left Knee Flexion 5/5   was 4-/5    Left Knee Extension 5/5   Right Ankle Dorsiflexion 5/5   Left Ankle Dorsiflexion 5/5                     [facility] Adult PT Treatment/Exercise - 12/26/14 0001    Exercises   Exercises Lumbar   Lumbar Exercises: Stretches   Active Hamstring Stretch 3 reps;30 seconds   Active Hamstring Stretch Limitations 14" step   Single Knee to Chest Stretch 3 reps;30 seconds   Single Knee to Chest Stretch Limitations Standing lunge stretch c 14" box   Lumbar Exercises: Standing   Heel Raises 15 reps   Heel Raises Limitations toe and heel raises    Other Standing Lumbar Exercises step ups on 4 inch box 1x15                 PT Education - 12/26/14 1133    Education provided Yes   Education Details encouraged to continue to work on single leg balance at home after DC    Person(s) Educated Patient   Methods Explanation;Demonstration   Comprehension Verbalized understanding          PT Short Term Goals - 12/26/14 1126    PT SHORT TERM GOAL #1   Title I HEP   Time 1   Period Weeks   Status Achieved   PT SHORT TERM GOAL #2   Title Pt pain to be no greater than a 5/10   Time 2   Period Weeks   Status Achieved   PT SHORT TERM GOAL #3   Title Pt to be able to sit with comfort for 30 mintues to be able to enjoy a meal    Time 2   Period Weeks   Status Achieved   PT SHORT TERM GOAL #4   Title Pt to be able to walk with comfort for 15 mintues to be able to complete a light grocery strore trip    Time 2   Period Weeks   Status Achieved   PT SHORT TERM GOAL #5   Title Pt to be able to stand with comfort for 30 mintues to make a meal    Time 2   Period Weeks   Status Achieved           PT Long Term Goals - 12/26/14 1127    PT LONG TERM GOAL #2   Title Pt pain level to be no  greater than a 3/10   Time 4   Period Weeks   Status Achieved   PT LONG TERM GOAL #3   Title Pt to be able to sit for an hour to be able to travel or eat a meal restaurant  with comfort.    Time 4   Period Weeks   Status Achieved   PT LONG TERM GOAL #4   Title Pt to be able to walk for 30 mintues with no increase pain to be able to complet grocery shopping in comfort.    Time 4   Period Weeks   Status On-going   PT LONG TERM GOAL #5   Title Pt to be able to SLS x 10 seconds B to reduce risk of falling    Time 4   Period Weeks   Status Not Met               Plan - 07-Jan-2015 1135    Clinical Impression Statement Pt has shown progress toward all goals, short and long, with exception to improvements in SLS. Pt is very pleased with progress adn improved strength and community ambulation. Will DC today, patient with updated home exercise program.    Pt will benefit from skilled therapeutic intervention in order to improve on the following deficits Abnormal gait;Decreased activity tolerance;Decreased balance;Decreased range of motion;Decreased strength;Difficulty walking;Pain;Hypomobility;Increased fascial restricitons   Rehab Potential Good   PT Frequency 3x / week   PT Duration 6 weeks   PT Treatment/Interventions ADLs/Self Care Home Management;Patient/family education;Manual techniques;Therapeutic activities;Therapeutic exercise;Balance training;Neuromuscular re-education   PT Home Exercise Plan updated   Consulted and Agree with Plan of Care Patient          G-Codes - Jan 07, 2015 1137    Functional Assessment Tool Used Clinical Judgment    Functional Limitation Other PT primary   Other PT Primary Current Status (Y8502) At least 40 percent but less than 60 percent impaired, limited or restricted   Other PT Primary Goal Status (D7412) At least 40 percent but less than 60 percent impaired, limited or restricted   Other PT Primary Discharge Status (I7867) At least 1 percent but less than 20 percent impaired, limited or restricted      Problem List Patient Active Problem List   Diagnosis Date Noted  . Renal insufficiency 04/25/2014  . Acute on  chronic diastolic heart failure ([place]) 04/11/2014  . Acute on chronic respiratory failure ([place]) 04/11/2014  . CKD (chronic kidney disease) stage 3, GFR 30-59 ml/min 04/11/2014  . Prediabetes 12/03/2013  . Hypertrophic obstructive cardiomyopathy ([place]) 11/09/2013  . Hypoxia 08/28/2013  . Respiratory failure with hypoxia ([place]) 05/07/2013  . Acute diastolic CHF (congestive heart failure) ([place]) 05/07/2013  . COPD exacerbation ([place]) 02/15/2013  . Hyperlipidemia 11/09/2010  . Acute diastolic heart failure ([place]) 10/15/2010  . CAD, NATIVE VESSEL 09/18/2009  . BRADYCARDIA 08/19/2009  . Dementia 07/19/2007  . Essential hypertension 07/19/2007  . CAROTID ARTERY STENOSIS 07/19/2007  . ASTHMA 07/19/2007  . COPD with asthma ([place]) 07/19/2007  . HEADACHE, CHRONIC, HX OF 07/19/2007   PHYSICAL THERAPY DISCHARGE SUMMARY  Visits from Start of Care: 13  Current functional level related to goals / functional outcomes: *see above   Remaining deficits: *see above   Education / Equipment: *see above Plan: Patient agrees to discharge.  Patient goals were met. Patient is being discharged due to meeting the stated rehab goals.  ?????       Buccola,Allan C  01/07/15, 11:40 AM  11:40  AM  Etta Grandchild, PT, DPT [place] License # [id number]       [facility] [address] [place], [place], [address] Phone: [phone]   Fax:  [phone]  Name: [name] MRN: [id number] Date of Birth: May 14, 1935

## 2015-01-01 DIAGNOSIS — H25812 Combined forms of age-related cataract, left eye: Secondary | ICD-10-CM | POA: Diagnosis not present

## 2015-01-01 DIAGNOSIS — H2511 Age-related nuclear cataract, right eye: Secondary | ICD-10-CM | POA: Diagnosis not present

## 2015-01-05 ENCOUNTER — Encounter (HOSPITAL_COMMUNITY): Payer: Self-pay | Admitting: Emergency Medicine

## 2015-01-05 ENCOUNTER — Emergency Department (HOSPITAL_COMMUNITY): Payer: Medicare Other

## 2015-01-05 ENCOUNTER — Inpatient Hospital Stay (HOSPITAL_COMMUNITY)
Admission: EM | Admit: 2015-01-05 | Discharge: 2015-01-06 | DRG: 291 | Disposition: A | Discharge status: Home / Self Care | Payer: Medicare Other | Attending: Internal Medicine | Admitting: Internal Medicine

## 2015-01-05 ENCOUNTER — Ambulatory Visit (INDEPENDENT_AMBULATORY_CARE_PROVIDER_SITE_OTHER): Payer: Medicare Other | Admitting: Family Medicine

## 2015-01-05 ENCOUNTER — Encounter: Payer: Self-pay | Admitting: Family Medicine

## 2015-01-05 VITALS — BP 134/68 | Temp 98.7°F | Wt 179.1 lb

## 2015-01-05 DIAGNOSIS — E785 Hyperlipidemia, unspecified: Secondary | ICD-10-CM | POA: Diagnosis present

## 2015-01-05 DIAGNOSIS — I252 Old myocardial infarction: Secondary | ICD-10-CM | POA: Diagnosis not present

## 2015-01-05 DIAGNOSIS — I10 Essential (primary) hypertension: Secondary | ICD-10-CM | POA: Diagnosis not present

## 2015-01-05 DIAGNOSIS — N183 Chronic kidney disease, stage 3 unspecified: Secondary | ICD-10-CM | POA: Diagnosis present

## 2015-01-05 DIAGNOSIS — J449 Chronic obstructive pulmonary disease, unspecified: Secondary | ICD-10-CM | POA: Diagnosis not present

## 2015-01-05 DIAGNOSIS — R0600 Dyspnea, unspecified: Secondary | ICD-10-CM | POA: Diagnosis not present

## 2015-01-05 DIAGNOSIS — I5033 Acute on chronic diastolic (congestive) heart failure: Secondary | ICD-10-CM | POA: Diagnosis not present

## 2015-01-05 DIAGNOSIS — I6523 Occlusion and stenosis of bilateral carotid arteries: Secondary | ICD-10-CM | POA: Diagnosis not present

## 2015-01-05 DIAGNOSIS — J9621 Acute and chronic respiratory failure with hypoxia: Secondary | ICD-10-CM | POA: Diagnosis present

## 2015-01-05 DIAGNOSIS — I421 Obstructive hypertrophic cardiomyopathy: Secondary | ICD-10-CM | POA: Diagnosis present

## 2015-01-05 DIAGNOSIS — I130 Hypertensive heart and chronic kidney disease with heart failure and stage 1 through stage 4 chronic kidney disease, or unspecified chronic kidney disease: Principal | ICD-10-CM | POA: Diagnosis present

## 2015-01-05 DIAGNOSIS — I2510 Atherosclerotic heart disease of native coronary artery without angina pectoris: Secondary | ICD-10-CM | POA: Diagnosis present

## 2015-01-05 DIAGNOSIS — M810 Age-related osteoporosis without current pathological fracture: Secondary | ICD-10-CM | POA: Diagnosis present

## 2015-01-05 DIAGNOSIS — R0602 Shortness of breath: Secondary | ICD-10-CM | POA: Diagnosis not present

## 2015-01-05 DIAGNOSIS — R0789 Other chest pain: Secondary | ICD-10-CM

## 2015-01-05 DIAGNOSIS — Z9981 Dependence on supplemental oxygen: Secondary | ICD-10-CM

## 2015-01-05 DIAGNOSIS — R0902 Hypoxemia: Secondary | ICD-10-CM | POA: Diagnosis present

## 2015-01-05 DIAGNOSIS — I5031 Acute diastolic (congestive) heart failure: Secondary | ICD-10-CM | POA: Diagnosis not present

## 2015-01-05 DIAGNOSIS — Z87891 Personal history of nicotine dependence: Secondary | ICD-10-CM | POA: Diagnosis not present

## 2015-01-05 DIAGNOSIS — Z811 Family history of alcohol abuse and dependence: Secondary | ICD-10-CM

## 2015-01-05 DIAGNOSIS — J9611 Chronic respiratory failure with hypoxia: Secondary | ICD-10-CM | POA: Diagnosis present

## 2015-01-05 DIAGNOSIS — Z9861 Coronary angioplasty status: Secondary | ICD-10-CM

## 2015-01-05 DIAGNOSIS — Z9119 Patient's noncompliance with other medical treatment and regimen: Secondary | ICD-10-CM

## 2015-01-05 LAB — CBC WITH DIFFERENTIAL/PLATELET
Basophils Absolute: 0 10*3/uL (ref 0.0–0.1)
Basophils Relative: 1 %
Eosinophils Absolute: 0.1 10*3/uL (ref 0.0–0.7)
Eosinophils Relative: 3 %
HEMATOCRIT: 34.1 % — AB (ref 36.0–46.0)
HEMOGLOBIN: 11.4 g/dL — AB (ref 12.0–15.0)
LYMPHS ABS: 0.8 10*3/uL (ref 0.7–4.0)
LYMPHS PCT: 17 %
MCH: 29.5 pg (ref 26.0–34.0)
MCHC: 33.4 g/dL (ref 30.0–36.0)
MCV: 88.1 fL (ref 78.0–100.0)
MONOS PCT: 8 %
Monocytes Absolute: 0.4 10*3/uL (ref 0.1–1.0)
NEUTROS ABS: 3.4 10*3/uL (ref 1.7–7.7)
NEUTROS PCT: 71 %
Platelets: 177 10*3/uL (ref 150–400)
RBC: 3.87 MIL/uL (ref 3.87–5.11)
RDW: 13.9 % (ref 11.5–15.5)
WBC: 4.7 10*3/uL (ref 4.0–10.5)

## 2015-01-05 LAB — COMPREHENSIVE METABOLIC PANEL
ALK PHOS: 55 U/L (ref 38–126)
ALT: 14 U/L (ref 14–54)
ANION GAP: 11 (ref 5–15)
AST: 19 U/L (ref 15–41)
Albumin: 4.2 g/dL (ref 3.5–5.0)
BILIRUBIN TOTAL: 0.4 mg/dL (ref 0.3–1.2)
BUN: 28 mg/dL — ABNORMAL HIGH (ref 6–20)
CALCIUM: 8.7 mg/dL — AB (ref 8.9–10.3)
CO2: 30 mmol/L (ref 22–32)
CREATININE: 1.75 mg/dL — AB (ref 0.44–1.00)
Chloride: 99 mmol/L — ABNORMAL LOW (ref 101–111)
GFR calc non Af Amer: 27 mL/min — ABNORMAL LOW (ref >60)
GFR, EST AFRICAN AMERICAN: 31 mL/min — AB (ref >60)
GLUCOSE: 111 mg/dL — AB (ref 65–99)
Potassium: 3.9 mmol/L (ref 3.5–5.1)
Sodium: 140 mmol/L (ref 135–145)
TOTAL PROTEIN: 7.3 g/dL (ref 6.5–8.1)

## 2015-01-05 LAB — BRAIN NATRIURETIC PEPTIDE: B NATRIURETIC PEPTIDE 5: 348 pg/mL — AB (ref 0.0–100.0)

## 2015-01-05 LAB — TROPONIN I: Troponin I: 0.03 ng/mL (ref <0.031)

## 2015-01-05 MED ORDER — ASPIRIN EC 81 MG PO TBEC
81.0000 mg | DELAYED_RELEASE_TABLET | Freq: Every morning | ORAL | Status: DC
  Administered 2015-01-06: 81 mg via ORAL
  Filled 2015-01-05: qty 1

## 2015-01-05 MED ORDER — VALACYCLOVIR HCL 500 MG PO TABS
1000.0000 mg | ORAL_TABLET | Freq: Three times a day (TID) | ORAL | Status: DC
  Administered 2015-01-06 (×3): 1000 mg via ORAL
  Filled 2015-01-05 (×5): qty 2

## 2015-01-05 MED ORDER — POLYETHYLENE GLYCOL 3350 17 G PO PACK: 17.0000 g | PACK | Freq: Every day | ORAL | Status: DC | PRN

## 2015-01-05 MED ORDER — ENOXAPARIN SODIUM 30 MG/0.3ML ~~LOC~~ SOLN
30.0000 mg | SUBCUTANEOUS | Status: DC
  Administered 2015-01-06: 30 mg via SUBCUTANEOUS
  Filled 2015-01-05: qty 0.3

## 2015-01-05 MED ORDER — PRAVASTATIN SODIUM 40 MG PO TABS
40.0000 mg | ORAL_TABLET | Freq: Every day | ORAL | Status: DC
  Administered 2015-01-06: 40 mg via ORAL
  Filled 2015-01-05: qty 1

## 2015-01-05 MED ORDER — SODIUM CHLORIDE 0.9 % IJ SOLN
3.0000 mL | Freq: Two times a day (BID) | INTRAMUSCULAR | Status: DC
  Administered 2015-01-06 (×2): 3 mL via INTRAVENOUS

## 2015-01-05 MED ORDER — LOSARTAN POTASSIUM 50 MG PO TABS
100.0000 mg | ORAL_TABLET | Freq: Every day | ORAL | Status: DC
  Administered 2015-01-06: 100 mg via ORAL
  Filled 2015-01-05: qty 2

## 2015-01-05 MED ORDER — ALBUTEROL SULFATE (2.5 MG/3ML) 0.083% IN NEBU: 2.5000 mg | INHALATION_SOLUTION | Freq: Four times a day (QID) | RESPIRATORY_TRACT | Status: DC

## 2015-01-05 MED ORDER — BUDESONIDE-FORMOTEROL FUMARATE 160-4.5 MCG/ACT IN AERO
2.0000 | INHALATION_SPRAY | Freq: Two times a day (BID) | RESPIRATORY_TRACT | Status: DC
  Administered 2015-01-06: 2 via RESPIRATORY_TRACT
  Filled 2015-01-05: qty 6

## 2015-01-05 MED ORDER — LORAZEPAM 1 MG PO TABS: 1.0000 mg | ORAL_TABLET | Freq: Every evening | ORAL | Status: DC | PRN

## 2015-01-05 MED ORDER — AMLODIPINE BESYLATE 5 MG PO TABS
2.5000 mg | ORAL_TABLET | Freq: Every day | ORAL | Status: DC
  Administered 2015-01-06: 2.5 mg via ORAL
  Filled 2015-01-05: qty 1

## 2015-01-05 MED ORDER — ONDANSETRON HCL 4 MG PO TABS: 4.0000 mg | ORAL_TABLET | Freq: Four times a day (QID) | ORAL | Status: DC | PRN

## 2015-01-05 MED ORDER — SODIUM CHLORIDE 0.9 % IJ SOLN: 3.0000 mL | INTRAMUSCULAR | Status: DC | PRN

## 2015-01-05 MED ORDER — GABAPENTIN 100 MG PO CAPS
100.0000 mg | ORAL_CAPSULE | Freq: Three times a day (TID) | ORAL | Status: DC
  Administered 2015-01-06 (×3): 100 mg via ORAL
  Filled 2015-01-05 (×3): qty 1

## 2015-01-05 MED ORDER — LATANOPROST 0.005 % OP SOLN
1.0000 [drp] | Freq: Every day | OPHTHALMIC | Status: DC
  Administered 2015-01-06: 1 [drp] via OPHTHALMIC
  Filled 2015-01-05: qty 2.5

## 2015-01-05 MED ORDER — POTASSIUM CHLORIDE CRYS ER 10 MEQ PO TBCR
10.0000 meq | EXTENDED_RELEASE_TABLET | Freq: Two times a day (BID) | ORAL | Status: DC
  Administered 2015-01-06 (×2): 10 meq via ORAL
  Filled 2015-01-05 (×2): qty 1

## 2015-01-05 MED ORDER — FUROSEMIDE 10 MG/ML IJ SOLN
40.0000 mg | Freq: Two times a day (BID) | INTRAMUSCULAR | Status: DC
  Administered 2015-01-06: 40 mg via INTRAVENOUS
  Filled 2015-01-05: qty 4

## 2015-01-05 MED ORDER — SODIUM CHLORIDE 0.9 % IV SOLN: 250.0000 mL | INTRAVENOUS | Status: DC | PRN

## 2015-01-05 MED ORDER — ONDANSETRON HCL 4 MG/2ML IJ SOLN: 4.0000 mg | Freq: Four times a day (QID) | INTRAMUSCULAR | Status: DC | PRN

## 2015-01-05 MED ORDER — IPRATROPIUM BROMIDE 0.02 % IN SOLN: 0.5000 mg | Freq: Four times a day (QID) | RESPIRATORY_TRACT | Status: DC

## 2015-01-05 MED ORDER — DIFLUPREDNATE 0.05 % OP EMUL
1.0000 [drp] | Freq: Two times a day (BID) | OPHTHALMIC | Status: DC
  Administered 2015-01-06: 1 [drp] via OPHTHALMIC

## 2015-01-05 MED ORDER — CITALOPRAM HYDROBROMIDE 20 MG PO TABS
40.0000 mg | ORAL_TABLET | Freq: Every morning | ORAL | Status: DC
  Administered 2015-01-06: 40 mg via ORAL
  Filled 2015-01-05: qty 2

## 2015-01-05 MED ORDER — DONEPEZIL HCL 5 MG PO TABS
10.0000 mg | ORAL_TABLET | Freq: Every day | ORAL | Status: DC
  Administered 2015-01-06: 10 mg via ORAL
  Filled 2015-01-05: qty 2

## 2015-01-05 MED ORDER — TOBRAMYCIN 0.3 % OP SOLN
1.0000 [drp] | Freq: Four times a day (QID) | OPHTHALMIC | Status: DC
  Administered 2015-01-06 (×3): 1 [drp] via OPHTHALMIC
  Filled 2015-01-05: qty 5

## 2015-01-05 NOTE — ED Notes (Signed)
Walked patient with pulse ox and O2 around nursing station. Heart Rate was 70 O2 sat was 83, made doctor aware of results.

## 2015-01-05 NOTE — Progress Notes (Signed)
   Subjective:    Patient ID: [name], female    DOB: 03-Jun-1935, 79 y.o.   MRN: [id number]  Shortness of Breath This is a new problem. The current episode started in the past 7 days. The problem occurs daily. Associated symptoms include wheezing. The symptoms are aggravated by lying flat and any activity. She has tried steroid inhalers (Albuterol Neb solution) for the symptoms. The treatment provided no relief.   patient denies any coughing fever. Is having some wheezing tried her inhalers it did not help she described chest pressure over the weekend that radiated to her back she describes some nausea but no vomiting no fever has history of heart disease CHF and COPD and is oxygen dependent  Patient states no other concerns this visit.  Review of Systems  Respiratory: Positive for shortness of breath and wheezing.    patient relates some chest pressure radiates to her back she denies any arm pain she denies neck pain denies leg pain she relates shortness of breath with activity not relieved with oxygen. 25 minutes was spent with the patient. Greater than half the time was spent in discussion and answering questions and counseling regarding the issues that the patient came in for today. EKG shows some changes compared to previous EKG    Objective:   Physical Exam Neck no masses lungs no crackles bilateral expiratory wheezes has shallow breaths heart rate controlled. Extremities trace edema       Assessment & Plan:  Dyspnea-what is concerning about this patient's story is in fact that her shortness of breath started over the weekend without any significant respiratory symptoms such as coughing mucus phlegm or fever this is concerning for underlying heart disease she had chest pressure them with pain that radiated between her shoulder blades and they abnormal EKG this patient needs urgent referral to the ER family will take her in the vehicle she was put in wheelchair in order to get  to the vehicle. ER doctor was spoken with as well as the triage desk.

## 2015-01-05 NOTE — ED Notes (Signed)
PT c/o increased SOB on exertion x3 days with chest tightness. PT sent over from PCP office for eval for SOB.

## 2015-01-05 NOTE — ED Notes (Signed)
Report given to [facility] RN on 300

## 2015-01-05 NOTE — ED Provider Notes (Signed)
CSN: [id number]     Arrival date & time 01/05/15  1725 History   First MD Initiated Contact with Patient 01/05/15 1902     Chief Complaint  Patient presents with  . Shortness of Breath     (Consider location/radiation/quality/duration/timing/severity/associated sxs/prior Treatment) Patient is a 79 y.o. female presenting with shortness of breath. The history is provided by the spouse, the patient and a relative.  Shortness of Breath Associated symptoms: cough   Associated symptoms: no chest pain, no fever, no headaches, no neck pain and no rash    patient sent in from primary care office for concerns for hypoxia. And for concerns for possible cardiac ischemia. Patient without any chest pain. The patient's has exertional shortness of breath since Saturday. Patient known to have a history of COPD and CHF. No upper respiratory symptoms. Patient feels comfortable at rest. Does not feel like she's wheezing. The patient is normally on 3 L of oxygen.  Past Medical History  Diagnosis Date  . CAD (coronary artery disease) 08/2009    s/p PCI of the LAD  . Myocardial infarction ([facility])     Non-st segment elevated  . Hypertension   . Hyperlipidemia   . Bradycardia   . Carotid artery stenosis   . COPD (chronic obstructive pulmonary disease) ([facility])   . Chronic headache   . Asthma   . Pneumonia   . Ulnar neuropathy   . Seasonal allergies   . Impaired fasting glucose   . Dementia     patient denies this  . Osteoporosis 2009  . Acute on chronic diastolic heart failure (H[facility]) 04/11/2014   Past Surgical History  Procedure Laterality Date  . Partial hysterectomy  1984  . Radial artery catheter      Bladder  . Bladder surgery  1991  . Cardiac catheterization  11/2010    negative  . Left heart catheterization with coronary angiogram N/A 11/05/2012    Procedure: LEFT HEART CATHETERIZATION WITH CORONARY ANGIOGRAM;  Surgeon: Micheline Chapman, MD;  Location: [facility] CATH LAB;  Service: Cardiovascular;   Laterality: N/A;  . Cataract extraction  Nov 2016   Family History  Problem Relation Age of Onset  . Addison's disease Mother   . Alcohol abuse Father    Social History  Substance Use Topics  . Smoking status: Former Smoker -- 1.00 packs/day for 35 years    Quit date: 02/15/2008  . Smokeless tobacco: Never Used  . Alcohol Use: No   OB History    Gravida Para Term Preterm AB TAB SAB Ectopic Multiple Living            3     Review of Systems  Constitutional: Negative for fever.  HENT: Negative for congestion.   Eyes: Positive for visual disturbance. Negative for pain.  Respiratory: Positive for cough and shortness of breath.   Cardiovascular: Negative for chest pain.  Genitourinary: Negative for dysuria.  Musculoskeletal: Negative for back pain and neck pain.  Skin: Negative for rash.  Neurological: Negative for weakness, numbness and headaches.  Hematological: Does not bruise/bleed easily.  Psychiatric/Behavioral: Negative for confusion.      Allergies  Acetaminophen; Codeine; Dilaudid; Erythromycin; and Sulfonamide derivatives  Home Medications   Prior to Admission medications   Medication Sig Start Date End Date Taking? Authorizing Provider  albuterol (PROVENTIL HFA;VENTOLIN HFA) 108 (90 BASE) MCG/ACT inhaler Inhale 2 puffs into the lungs 3 (three) times daily. 04/16/14  Yes Elliot Cousin, MD  albuterol (PROVENTIL) (2.5 MG/3ML) 0.083% nebulizer  solution Take 3 mLs (2.5 mg total) by nebulization every 4 (four) hours as needed for wheezing or shortness of breath. 04/18/14  Yes Babs Sciara, MD  alendronate (FOSAMAX) 70 MG tablet TAKE 1 TABLET BY MOUTH 1 TIME WEEKLY AS DIRECTED Patient taking differently: TAKE 1 TABLET BY MOUTH 1 TIME WEEKLY ON MONDAYS 10/30/14  Yes Babs Sciara, MD  Alpha-D-Galactosidase (BEANO) TABS Take 2 tablets by mouth 3 times/day as needed-between meals & bedtime (gas).    Yes Historical Provider, MD  amLODipine (NORVASC) 5 MG tablet Take 2.5 mg by  mouth daily.   Yes Historical Provider, MD  aspirin 81 MG EC tablet Take 81 mg by mouth every morning.    Yes Historical Provider, MD  budesonide-formoterol (SYMBICORT) 160-4.5 MCG/ACT inhaler Inhale 2 puffs into the lungs 2 (two) times daily. 08/28/13  Yes Babs Sciara, MD  Calcium Carbonate (CALCIUM 600 PO) Take 1 tablet by mouth daily.    Yes Historical Provider, MD  Cholecalciferol (VITAMIN D3) 1000 UNITS CAPS Take 1 capsule by mouth daily.   Yes Historical Provider, MD  citalopram (CELEXA) 40 MG tablet TAKE 1 TABLET BY MOUTH EVERY MORNING 01/08/14  Yes Jaidyn Kuhl A Luking, MD  donepezil (ARICEPT) 10 MG tablet TAKE 1 TABLET BY MOUTH EVERY NIGHT AT BEDTIME 12/22/14  Yes Hilario Robarts A Luking, MD  DUREZOL 0.05 % EMUL PLACE 1 DROP IN RIGHT EYE TWICE DAILY FOR 1 WEEK, THEN ONCE DAILY FOR 1 WEEK. START THE DAY OF SURGERY 12/09/14  Yes Historical Provider, MD  furosemide (LASIX) 40 MG tablet Take 2 tablets every morning by mouth. Patient taking differently: Take 80 mg by mouth every morning.  10/27/14  Yes Babs Sciara, MD  gabapentin (NEURONTIN) 100 MG capsule Take 100 mg by mouth 3 (three) times daily.   Yes Historical Provider, MD  gentamicin (GARAMYCIN) 0.3 % ophthalmic solution INJECT ONE DROP IN THE RIGHT EYE 4 TIMES DAILY  BEGINNING AFTER SURGERY 12/09/14  Yes Historical Provider, MD  LORazepam (ATIVAN) 1 MG tablet Take 1 mg by mouth at bedtime as needed for sleep (Take 1/2 to 1 tablet by mouth at bedtime as needed for sleep).   Yes Historical Provider, MD  losartan (COZAAR) 100 MG tablet TAKE 1 TABLET BY MOUTH EVERY DAY 10/13/14  Yes Babs Sciara, MD  nitroGLYCERIN (NITROSTAT) 0.4 MG SL tablet Place 1 tablet (0.4 mg total) under the tongue as needed for chest pain. 12/18/14  Yes Babs Sciara, MD  NON FORMULARY Place 2 L into the nose continuous. O2   Yes Historical Provider, MD  polyethylene glycol (MIRALAX / GLYCOLAX) packet Take 17 g by mouth daily as needed for moderate constipation. 04/16/14  Yes  Elliot Cousin, MD  potassium chloride (K-DUR,KLOR-CON) 10 MEQ tablet TAKE 1 TABLET(10 MEQ) BY MOUTH TWICE DAILY 10/10/14  Yes Babs Sciara, MD  pravastatin (PRAVACHOL) 40 MG tablet TAKE 1 TABLET BY MOUTH EVERY DAY 10/13/14  Yes Babs Sciara, MD  TRAVATAN Z 0.004 % SOLN ophthalmic solution Place 1 drop into the left eye at bedtime.  10/08/14  Yes Historical Provider, MD  valACYclovir (VALTREX) 1000 MG tablet Take 1 tablet by mouth 3 (three) times daily. Start taking 3 days before surgery 12/09/14  Yes Historical Provider, MD  erythromycin ophthalmic ointment USE IN THE EYE AS DIRECTED Patient not taking: Reported on 01/05/2015 01/13/14   Babs Sciara, MD   BP 178/55 mmHg  Pulse 59  Temp(Src) 98 F (36.7 C) (Oral)  Resp 18  Ht 5\' 3"  (1.6 m)  Wt 81.251 kg  BMI 31.74 kg/m2  SpO2 97% Physical Exam  Constitutional: She is oriented to person, place, and time. She appears well-developed and well-nourished. No distress.  HENT:  Head: Normocephalic and atraumatic.  Mouth/Throat: Oropharynx is clear and moist.  Eyes: Conjunctivae and EOM are normal. Pupils are equal, round, and reactive to light.  With the exception of a shield on her right eye due to cataract surgery.  Neck: Normal range of motion. Neck supple.  Cardiovascular: Normal rate, regular rhythm and normal heart sounds.   No murmur heard. Pulmonary/Chest: Effort normal and breath sounds normal. No respiratory distress. She has no wheezes. She has no rales.  Abdominal: Soft. Bowel sounds are normal. There is no tenderness.  Musculoskeletal: Normal range of motion.  Neurological: She is alert and oriented to person, place, and time. No cranial nerve deficit. She exhibits normal muscle tone. Coordination normal.  Skin: Skin is warm. No rash noted.  Nursing note and vitals reviewed.   ED Course  Procedures (including critical care time) Labs Review Labs Reviewed  CBC WITH DIFFERENTIAL/PLATELET - Abnormal; Notable for the  following:    Hemoglobin 11.4 (*)    HCT 34.1 (*)    All other components within normal limits  COMPREHENSIVE METABOLIC PANEL - Abnormal; Notable for the following:    Chloride 99 (*)    Glucose, Bld 111 (*)    BUN 28 (*)    Creatinine, Ser 1.75 (*)    Calcium 8.7 (*)    GFR calc non Af Amer 27 (*)    GFR calc Af Amer 31 (*)    All other components within normal limits  BRAIN NATRIURETIC PEPTIDE - Abnormal; Notable for the following:    B Natriuretic Peptide 348.0 (*)    All other components within normal limits  TROPONIN I   Results for orders placed or performed during the hospital encounter of 01/05/15  CBC with Differential  Result Value Ref Range   WBC 4.7 4.0 - 10.5 K/uL   RBC 3.87 3.87 - 5.11 MIL/uL   Hemoglobin 11.4 (L) 12.0 - 15.0 g/dL   HCT 40.9 (L) 81.1 - 91.4 %   MCV 88.1 78.0 - 100.0 fL   MCH 29.5 26.0 - 34.0 pg   MCHC 33.4 30.0 - 36.0 g/dL   RDW 78.2 95.6 - 21.3 %   Platelets 177 150 - 400 K/uL   Neutrophils Relative % 71 %   Neutro Abs 3.4 1.7 - 7.7 K/uL   Lymphocytes Relative 17 %   Lymphs Abs 0.8 0.7 - 4.0 K/uL   Monocytes Relative 8 %   Monocytes Absolute 0.4 0.1 - 1.0 K/uL   Eosinophils Relative 3 %   Eosinophils Absolute 0.1 0.0 - 0.7 K/uL   Basophils Relative 1 %   Basophils Absolute 0.0 0.0 - 0.1 K/uL  Comprehensive metabolic panel  Result Value Ref Range   Sodium 140 135 - 145 mmol/L   Potassium 3.9 3.5 - 5.1 mmol/L   Chloride 99 (L) 101 - 111 mmol/L   CO2 30 22 - 32 mmol/L   Glucose, Bld 111 (H) 65 - 99 mg/dL   BUN 28 (H) 6 - 20 mg/dL   Creatinine, Ser 0.86 (H) 0.44 - 1.00 mg/dL   Calcium 8.7 (L) 8.9 - 10.3 mg/dL   Total Protein 7.3 6.5 - 8.1 g/dL   Albumin 4.2 3.5 - 5.0 g/dL   AST 19 15 - 41  U/L   ALT 14 14 - 54 U/L   Alkaline Phosphatase 55 38 - 126 U/L   Total Bilirubin 0.4 0.3 - 1.2 mg/dL   GFR calc non Af Amer 27 (L) >60 mL/min   GFR calc Af Amer 31 (L) >60 mL/min   Anion gap 11 5 - 15  Troponin I  Result Value Ref Range    Troponin I 0.03 <0.031 ng/mL  Brain natriuretic peptide  Result Value Ref Range   B Natriuretic Peptide 348.0 (H) 0.0 - 100.0 pg/mL     Imaging Review Dg Chest 2 View  01/05/2015  CLINICAL DATA:  Increased shortness of breath on exertion for 3 days with chest tightness. EXAM: CHEST  2 VIEW COMPARISON:  06/07/2014. FINDINGS: Patient is rotated, trachea is midline. Heart is enlarged, stable. Thoracic aorta is calcified. Biapical pleural thickening. Lungs appear somewhat hyperinflated with minimal volume loss in the left lower lobe, with an elevated left hemidiaphragm, stable. No definite pleural fluid. Chronic lower thoracic compression fracture. IMPRESSION: Hyperinflation without acute finding. Electronically Signed   By: Leanna Battles M.D.   On: 01/05/2015 18:19   I have personally reviewed and evaluated these images and lab results as part of my medical decision-making.   EKG Interpretation   Date/Time:  Monday January 05 2015 17:36:58 EST Ventricular Rate:  65 PR Interval:  176 QRS Duration: 101 QT Interval:  453 QTC Calculation: 471 R Axis:   55 Text Interpretation:  Sinus rhythm Probable LVH with secondary repol abnrm  No significant change since last tracing Confirmed by Jayshon Dommer  MD, Solomiya Pascale  [phone]) on 01/05/2015 7:11:34 PM      MDM   Final diagnoses:  Chronic obstructive pulmonary disease, unspecified COPD type (HCC)  Hypoxia   Patient with a known history of CHF and COPD.  Feeling short of breath particularly with exertion since Saturday. Denies any chest pain or any upper respiratory symptoms. Patient is normally on 3 L of oxygen at all times.  The patient was at primary care office today they were noting that she was satting with exertion down into the 80s. And referred here for further evaluation. In addition they thought there was lateral ischemic changes on her EKG.  Today's EKG done here is completely unchanged from the one that we have in April. Patient's  troponins also negative. Chest x-ray negative for any signs of pulmonary edema. Patient basic labs without significant abnormalities no anemia no leukocytosis no significant electrolyte abnormalities. Patient does have an elevated creatinine which is somewhat baseline.  I discussed with hospitalist she will be admitted to telemetry observation.     Vanetta Mulders, MD 01/05/15 2026

## 2015-01-05 NOTE — H&P (Signed)
PCP:   Lilyan Punt, MD   Chief Complaint:  Shortness of breath  HPI:  79 year old female who  has a past medical history of CAD (coronary artery disease) (08/2009); Myocardial infarction (HCC); Hypertension; Hyperlipidemia; Bradycardia; Carotid artery stenosis; COPD (chronic obstructive pulmonary disease) (HCC); Chronic headache; Asthma; Pneumonia; Ulnar neuropathy; Seasonal allergies; Impaired fasting glucose; Dementia; Osteoporosis (2009); and Acute on chronic diastolic heart failure (HCC) (10/13/5619). Today presents to the hospital with complains of shortness of breath. Patient says that she has developed exertional shortness of breath since Saturday. She has a history of CHF and COPD. She takes Lasix at home. She denies coughing up any phlegm. Patient wears oxygen at home and sats are above 90% at rest and drops to 80s on exertion. Patient is on 3 L oxygen at home. She had cataract surgery last week. Denies fever, no nausea vomiting or diarrhea. No dysuria urgency frequency of urination.  Allergies:   Allergies  Allergen Reactions  . Acetaminophen     Made her feel like her heart was pounding  . Codeine Nausea And Vomiting  . Dilaudid [Hydromorphone Hcl] Other (See Comments)    Extremely sensitive  . Erythromycin Other (See Comments)    Intense stomach pain  . Sulfonamide Derivatives     unknown      Past Medical History  Diagnosis Date  . CAD (coronary artery disease) 08/2009    s/p PCI of the LAD  . Myocardial infarction (HCC)     Non-st segment elevated  . Hypertension   . Hyperlipidemia   . Bradycardia   . Carotid artery stenosis   . COPD (chronic obstructive pulmonary disease) (HCC)   . Chronic headache   . Asthma   . Pneumonia   . Ulnar neuropathy   . Seasonal allergies   . Impaired fasting glucose   . Dementia     patient denies this  . Osteoporosis 2009  . Acute on chronic diastolic heart failure (HCC) 04/11/2014    Past Surgical History  Procedure  Laterality Date  . Partial hysterectomy  1984  . Radial artery catheter      Bladder  . Bladder surgery  1991  . Cardiac catheterization  11/2010    negative  . Left heart catheterization with coronary angiogram N/A 11/05/2012    Procedure: LEFT HEART CATHETERIZATION WITH CORONARY ANGIOGRAM;  Surgeon: Micheline Chapman, MD;  Location: [facility] CATH LAB;  Service: Cardiovascular;  Laterality: N/A;  . Cataract extraction  Nov 2016    Prior to Admission medications   Medication Sig Start Date End Date Taking? Authorizing Provider  albuterol (PROVENTIL HFA;VENTOLIN HFA) 108 (90 BASE) MCG/ACT inhaler Inhale 2 puffs into the lungs 3 (three) times daily. 04/16/14  Yes Elliot Cousin, MD  albuterol (PROVENTIL) (2.5 MG/3ML) 0.083% nebulizer solution Take 3 mLs (2.5 mg total) by nebulization every 4 (four) hours as needed for wheezing or shortness of breath. 04/18/14  Yes Babs Sciara, MD  alendronate (FOSAMAX) 70 MG tablet TAKE 1 TABLET BY MOUTH 1 TIME WEEKLY AS DIRECTED Patient taking differently: TAKE 1 TABLET BY MOUTH 1 TIME WEEKLY ON MONDAYS 10/30/14  Yes Babs Sciara, MD  Alpha-D-Galactosidase (BEANO) TABS Take 2 tablets by mouth 3 times/day as needed-between meals & bedtime (gas).    Yes Historical Provider, MD  amLODipine (NORVASC) 5 MG tablet Take 2.5 mg by mouth daily.   Yes Historical Provider, MD  aspirin 81 MG EC tablet Take 81 mg by mouth every morning.  Yes Historical Provider, MD  budesonide-formoterol (SYMBICORT) 160-4.5 MCG/ACT inhaler Inhale 2 puffs into the lungs 2 (two) times daily. 08/28/13  Yes Babs Sciara, MD  Calcium Carbonate (CALCIUM 600 PO) Take 1 tablet by mouth daily.    Yes Historical Provider, MD  Cholecalciferol (VITAMIN D3) 1000 UNITS CAPS Take 1 capsule by mouth daily.   Yes Historical Provider, MD  citalopram (CELEXA) 40 MG tablet TAKE 1 TABLET BY MOUTH EVERY MORNING 01/08/14  Yes Scott A Luking, MD  donepezil (ARICEPT) 10 MG tablet TAKE 1 TABLET BY MOUTH EVERY NIGHT AT  BEDTIME 12/22/14  Yes Scott A Luking, MD  DUREZOL 0.05 % EMUL PLACE 1 DROP IN RIGHT EYE TWICE DAILY FOR 1 WEEK, THEN ONCE DAILY FOR 1 WEEK. START THE DAY OF SURGERY 12/09/14  Yes Historical Provider, MD  furosemide (LASIX) 40 MG tablet Take 2 tablets every morning by mouth. Patient taking differently: Take 80 mg by mouth every morning.  10/27/14  Yes Babs Sciara, MD  gabapentin (NEURONTIN) 100 MG capsule Take 100 mg by mouth 3 (three) times daily.   Yes Historical Provider, MD  gentamicin (GARAMYCIN) 0.3 % ophthalmic solution INJECT ONE DROP IN THE RIGHT EYE 4 TIMES DAILY  BEGINNING AFTER SURGERY 12/09/14  Yes Historical Provider, MD  LORazepam (ATIVAN) 1 MG tablet Take 1 mg by mouth at bedtime as needed for sleep (Take 1/2 to 1 tablet by mouth at bedtime as needed for sleep).   Yes Historical Provider, MD  losartan (COZAAR) 100 MG tablet TAKE 1 TABLET BY MOUTH EVERY DAY 10/13/14  Yes Babs Sciara, MD  nitroGLYCERIN (NITROSTAT) 0.4 MG SL tablet Place 1 tablet (0.4 mg total) under the tongue as needed for chest pain. 12/18/14  Yes Babs Sciara, MD  NON FORMULARY Place 2 L into the nose continuous. O2   Yes Historical Provider, MD  polyethylene glycol (MIRALAX / GLYCOLAX) packet Take 17 g by mouth daily as needed for moderate constipation. 04/16/14  Yes Elliot Cousin, MD  potassium chloride (K-DUR,KLOR-CON) 10 MEQ tablet TAKE 1 TABLET(10 MEQ) BY MOUTH TWICE DAILY 10/10/14  Yes Babs Sciara, MD  pravastatin (PRAVACHOL) 40 MG tablet TAKE 1 TABLET BY MOUTH EVERY DAY 10/13/14  Yes Babs Sciara, MD  TRAVATAN Z 0.004 % SOLN ophthalmic solution Place 1 drop into the left eye at bedtime.  10/08/14  Yes Historical Provider, MD  valACYclovir (VALTREX) 1000 MG tablet Take 1 tablet by mouth 3 (three) times daily. Start taking 3 days before surgery 12/09/14  Yes Historical Provider, MD  erythromycin ophthalmic ointment USE IN THE EYE AS DIRECTED Patient not taking: Reported on 01/05/2015 01/13/14   Babs Sciara, MD    Social History:  reports that she quit smoking about 6 years ago. She has never used smokeless tobacco. She reports that she does not drink alcohol or use illicit drugs.  Family History  Problem Relation Age of Onset  . Addison's disease Mother   . Alcohol abuse Father     Ceasar Mons Weights   01/05/15 1734  Weight: 81.251 kg (179 lb 2 oz)    All the positives are listed in BOLD  Review of Systems:  HEENT: Headache, blurred vision, runny nose, sore throat Neck: Hypothyroidism, hyperthyroidism,,lymphadenopathy Chest : Shortness of breath, history of COPD, Asthma Heart : Chest pain, history of coronary arterey disease GI:  Nausea, vomiting, diarrhea, constipation, GERD GU: Dysuria, urgency, frequency of urination, hematuria Neuro: Stroke, seizures, syncope Psych: Depression, anxiety, hallucinations  Physical Exam: Blood pressure 191/66, pulse 63, temperature 98 F (36.7 C), temperature source Oral, resp. rate 18, height 5\' 3"  (1.6 m), weight 81.251 kg (179 lb 2 oz), SpO2 97 %. Constitutional:   Patient is a well-developed and well-nourished  female in no acute distress and cooperative with exam. Head: Normocephalic and atraumatic Mouth: Mucus membranes moist Eyes: right eye covered in bandage Neck: Supple, No Thyromegaly Cardiovascular: RRR, S1 normal, S2 normal Pulmonary/Chest: decreased breath sounds at lung bases  Abdominal: Soft. Non-tender, non-distended, bowel sounds are normal, no masses, organomegaly, or guarding present.  Neurological: A&O x3, Strength is normal and symmetric bilaterally, cranial nerve II-XII are grossly intact, no focal motor deficit, sensory intact to light touch bilaterally.  Extremities : No Cyanosis, Clubbing or Edema  Labs on Admission:  Basic Metabolic Panel:  Recent Labs Lab 01/05/15 1807  NA 140  K 3.9  CL 99*  CO2 30  GLUCOSE 111*  BUN 28*  CREATININE 1.75*  CALCIUM 8.7*   Liver Function Tests:  Recent Labs Lab  01/05/15 1807  AST 19  ALT 14  ALKPHOS 55  BILITOT 0.4  PROT 7.3  ALBUMIN 4.2   No results for input(s): LIPASE, AMYLASE in the last 168 hours. No results for input(s): AMMONIA in the last 168 hours. CBC:  Recent Labs Lab 01/05/15 1807  WBC 4.7  NEUTROABS 3.4  HGB 11.4*  HCT 34.1*  MCV 88.1  PLT 177   Cardiac Enzymes:  Recent Labs Lab 01/05/15 1807  TROPONINI 0.03    BNP (last 3 results)  Recent Labs  04/10/14 1433 06/07/14 0321 01/05/15 1807  BNP 989.0* 582.0* 348.0*    ProBNP (last 3 results) No results for input(s): PROBNP in the last 8760 hours.  CBG: No results for input(s): GLUCAP in the last 168 hours.  Radiological Exams on Admission: Dg Chest 2 View  01/05/2015  CLINICAL DATA:  Increased shortness of breath on exertion for 3 days with chest tightness. EXAM: CHEST  2 VIEW COMPARISON:  06/07/2014. FINDINGS: Patient is rotated, trachea is midline. Heart is enlarged, stable. Thoracic aorta is calcified. Biapical pleural thickening. Lungs appear somewhat hyperinflated with minimal volume loss in the left lower lobe, with an elevated left hemidiaphragm, stable. No definite pleural fluid. Chronic lower thoracic compression fracture. IMPRESSION: Hyperinflation without acute finding. Electronically Signed   By: Leanna Battles M.D.   On: 01/05/2015 18:19    EKG: Independently reviewed. Normal sinus rhythm    Assessment/Plan Active Problems:   Essential hypertension   CAD, NATIVE VESSEL   Acute diastolic CHF (congestive heart failure) (HCC)   Hypoxia   Hypertrophic obstructive cardiomyopathy (HCC)   Acute on chronic diastolic heart failure (HCC)   Acute on chronic respiratory failure (HCC)   CKD (chronic kidney disease) stage 3, GFR 30-59 ml/min  Dyspnea on exertion Likely from acute on chronic diastolic CHF, will start Lasix 40 g IV every 12 hours. Patient takes Lasix 80 mg daily at home. BMP in the hospital today is 348. Chest x-ray does not show  pulmonary edema only shows hyperinflated lungs. Will check VQ scan to rule out pulmonary embolism. Cannot get CT angiogram due to underlying C KD stage III.  History of COPD Continue to nebs every 6 hours  Status post right cataract surgery Continue eyedrops as per ophthalmology  CKD stage III Patient's creatinine today 1.75 inches around her baseline. Follow BMP in a.m.  DVT prophylaxis Lovenox  Code status: Full code  Family discussion: Admission, patients  condition and plan of care including tests being ordered have been discussed with the patient and *her husband at bedside who indicate understanding and agree with the plan and Code Status.   Time Spent on Admission: 60 min  Zlata Alcaide S [facility] Pager: [phone] 01/05/2015, 8:50 PM  If 7PM-7AM, please contact night-coverage  [email or web address]  Password [email or web address]

## 2015-01-05 NOTE — ED Notes (Signed)
Pt given meal tray.

## 2015-01-06 ENCOUNTER — Observation Stay (HOSPITAL_COMMUNITY): Payer: Medicare Other

## 2015-01-06 ENCOUNTER — Encounter (HOSPITAL_COMMUNITY): Payer: Self-pay

## 2015-01-06 DIAGNOSIS — Z9119 Patient's noncompliance with other medical treatment and regimen: Secondary | ICD-10-CM | POA: Diagnosis not present

## 2015-01-06 DIAGNOSIS — I5033 Acute on chronic diastolic (congestive) heart failure: Secondary | ICD-10-CM

## 2015-01-06 DIAGNOSIS — I421 Obstructive hypertrophic cardiomyopathy: Secondary | ICD-10-CM | POA: Diagnosis present

## 2015-01-06 DIAGNOSIS — Z9981 Dependence on supplemental oxygen: Secondary | ICD-10-CM | POA: Diagnosis not present

## 2015-01-06 DIAGNOSIS — M810 Age-related osteoporosis without current pathological fracture: Secondary | ICD-10-CM | POA: Diagnosis present

## 2015-01-06 DIAGNOSIS — J449 Chronic obstructive pulmonary disease, unspecified: Secondary | ICD-10-CM | POA: Diagnosis present

## 2015-01-06 DIAGNOSIS — I2510 Atherosclerotic heart disease of native coronary artery without angina pectoris: Secondary | ICD-10-CM | POA: Diagnosis present

## 2015-01-06 DIAGNOSIS — J9621 Acute and chronic respiratory failure with hypoxia: Secondary | ICD-10-CM

## 2015-01-06 DIAGNOSIS — I130 Hypertensive heart and chronic kidney disease with heart failure and stage 1 through stage 4 chronic kidney disease, or unspecified chronic kidney disease: Secondary | ICD-10-CM | POA: Diagnosis present

## 2015-01-06 DIAGNOSIS — Z811 Family history of alcohol abuse and dependence: Secondary | ICD-10-CM | POA: Diagnosis not present

## 2015-01-06 DIAGNOSIS — N183 Chronic kidney disease, stage 3 (moderate): Secondary | ICD-10-CM | POA: Diagnosis present

## 2015-01-06 DIAGNOSIS — Z87891 Personal history of nicotine dependence: Secondary | ICD-10-CM | POA: Diagnosis not present

## 2015-01-06 DIAGNOSIS — E785 Hyperlipidemia, unspecified: Secondary | ICD-10-CM | POA: Diagnosis present

## 2015-01-06 DIAGNOSIS — Z9861 Coronary angioplasty status: Secondary | ICD-10-CM | POA: Diagnosis not present

## 2015-01-06 DIAGNOSIS — R0602 Shortness of breath: Secondary | ICD-10-CM | POA: Diagnosis not present

## 2015-01-06 DIAGNOSIS — I252 Old myocardial infarction: Secondary | ICD-10-CM | POA: Diagnosis not present

## 2015-01-06 LAB — CBC
HEMATOCRIT: 31.9 % — AB (ref 36.0–46.0)
HEMOGLOBIN: 10.5 g/dL — AB (ref 12.0–15.0)
MCH: 28.9 pg (ref 26.0–34.0)
MCHC: 32.9 g/dL (ref 30.0–36.0)
MCV: 87.9 fL (ref 78.0–100.0)
Platelets: 171 10*3/uL (ref 150–400)
RBC: 3.63 MIL/uL — AB (ref 3.87–5.11)
RDW: 14 % (ref 11.5–15.5)
WBC: 4 10*3/uL (ref 4.0–10.5)

## 2015-01-06 LAB — COMPREHENSIVE METABOLIC PANEL
ALBUMIN: 3.7 g/dL (ref 3.5–5.0)
ALK PHOS: 43 U/L (ref 38–126)
ALT: 13 U/L — AB (ref 14–54)
AST: 15 U/L (ref 15–41)
Anion gap: 8 (ref 5–15)
BILIRUBIN TOTAL: 0.4 mg/dL (ref 0.3–1.2)
BUN: 26 mg/dL — AB (ref 6–20)
CO2: 31 mmol/L (ref 22–32)
CREATININE: 1.52 mg/dL — AB (ref 0.44–1.00)
Calcium: 8.5 mg/dL — ABNORMAL LOW (ref 8.9–10.3)
Chloride: 103 mmol/L (ref 101–111)
GFR calc Af Amer: 37 mL/min — ABNORMAL LOW (ref >60)
GFR, EST NON AFRICAN AMERICAN: 32 mL/min — AB (ref >60)
GLUCOSE: 100 mg/dL — AB (ref 65–99)
POTASSIUM: 3.8 mmol/L (ref 3.5–5.1)
Sodium: 142 mmol/L (ref 135–145)
TOTAL PROTEIN: 6.5 g/dL (ref 6.5–8.1)

## 2015-01-06 LAB — TROPONIN I
TROPONIN I: 0.03 ng/mL (ref <0.031)
Troponin I: 0.03 ng/mL (ref <0.031)
Troponin I: 0.03 ng/mL (ref <0.031)

## 2015-01-06 MED ORDER — LORAZEPAM 0.5 MG PO TABS
0.5000 mg | ORAL_TABLET | Freq: Every evening | ORAL | Status: DC | PRN
Start: 2015-01-06 — End: 2015-01-06

## 2015-01-06 MED ORDER — VALACYCLOVIR HCL 500 MG PO TABS
ORAL_TABLET | ORAL | Status: AC
  Filled 2015-01-06: qty 2

## 2015-01-06 MED ORDER — DIFLUPREDNATE 0.05 % OP EMUL: 1.0000 [drp] | Freq: Every day | OPHTHALMIC | Status: DC

## 2015-01-06 MED ORDER — FUROSEMIDE 40 MG PO TABS: ORAL_TABLET | ORAL | Status: DC

## 2015-01-06 MED ORDER — TECHNETIUM TO 99M ALBUMIN AGGREGATED
4.0000 | Freq: Once | INTRAVENOUS | Status: AC | PRN
  Administered 2015-01-06: 3.8 via INTRAVENOUS

## 2015-01-06 MED ORDER — BUDESONIDE-FORMOTEROL FUMARATE 160-4.5 MCG/ACT IN AERO
INHALATION_SPRAY | RESPIRATORY_TRACT | Status: AC
  Filled 2015-01-06: qty 6

## 2015-01-06 MED ORDER — IPRATROPIUM-ALBUTEROL 0.5-2.5 (3) MG/3ML IN SOLN
3.0000 mL | Freq: Four times a day (QID) | RESPIRATORY_TRACT | Status: DC
  Administered 2015-01-06 (×2): 3 mL via RESPIRATORY_TRACT
  Filled 2015-01-06 (×2): qty 3

## 2015-01-06 MED ORDER — LATANOPROST 0.005 % OP SOLN
OPHTHALMIC | Status: AC
  Filled 2015-01-06: qty 2.5

## 2015-01-06 MED ORDER — IPRATROPIUM-ALBUTEROL 0.5-2.5 (3) MG/3ML IN SOLN: 3.0000 mL | Freq: Three times a day (TID) | RESPIRATORY_TRACT | Status: DC

## 2015-01-06 MED ORDER — TECHNETIUM TC 99M DIETHYLENETRIAME-PENTAACETIC ACID
30.0000 | Freq: Once | INTRAVENOUS | Status: DC | PRN
  Administered 2015-01-06: 30.2 via RESPIRATORY_TRACT
  Filled 2015-01-06: qty 30

## 2015-01-06 NOTE — Progress Notes (Signed)
Patient discharged via wheelchair with husband and daughter.  Patient left with all personal items and her IV removed and intact.

## 2015-01-06 NOTE — Care Management Note (Signed)
Case Management Note  Patient Details  Name: ASHIA MUSICO MRN: [id number] Date of Birth: March 15, 1935  Subjective/Objective:                  Pt admitted from home with hypoxia. Pt lives with her husband and will return home at discharge. Pt is independent with ADL's. Pt has home O2 and neb machine in the home.  Action/Plan: No CM needs noted. Pt potential discharge today.  Expected Discharge Date:                  Expected Discharge Plan:  Home/Self Care  In-House Referral:  NA  Discharge planning Services  CM Consult  Post Acute Care Choice:  NA Choice offered to:  NA  DME Arranged:    DME Agency:     HH Arranged:    HH Agency:     Status of Service:  Completed, signed off  Medicare Important Message Given:    Date Medicare IM Given:    Medicare IM give by:    Date Additional Medicare IM Given:    Additional Medicare Important Message give by:     If discussed at Long Length of Stay Meetings, dates discussed:    Additional Comments:  Cheryl Flash, RN 01/06/2015, 12:12 PM

## 2015-01-06 NOTE — Discharge Summary (Signed)
Physician Discharge Summary  BEVERLY FERNER AVW:[id number] DOB: [birth year] DOA: 01/05/2015  PCP: Lilyan Punt, MD  Admit date: 01/05/2015 Discharge date: 01/06/2015  Time spent: 45 minutes  Recommendations for Outpatient Follow-up:  -Will be discharged home today. -Advised to follow up with PCP in 2 weeks.   Discharge Diagnoses:  Active Problems:   Essential hypertension   CAD, NATIVE VESSEL   Acute diastolic CHF (congestive heart failure) (HCC)   Hypoxia   Hypertrophic obstructive cardiomyopathy (HCC)   Acute on chronic diastolic heart failure (HCC)   Acute on chronic respiratory failure (HCC)   CKD (chronic kidney disease) stage 3, GFR 30-59 ml/min   Discharge Condition: Stable and improved  Filed Weights   01/05/15 1734 01/05/15 2203  Weight: 81.251 kg (179 lb 2 oz) 80.967 kg (178 lb 8 oz)    History of present illness:  As per Dr. Sharl Ma 26/33: 79 year old female who  has a past medical history of CAD (coronary artery disease) (08/2009); Myocardial infarction (HCC); Hypertension; Hyperlipidemia; Bradycardia; Carotid artery stenosis; COPD (chronic obstructive pulmonary disease) (HCC); Chronic headache; Asthma; Pneumonia; Ulnar neuropathy; Seasonal allergies; Impaired fasting glucose; Dementia; Osteoporosis (2009); and Acute on chronic diastolic heart failure (HCC) (10/13/5619). Today presents to the hospital with complains of shortness of breath. Patient says that she has developed exertional shortness of breath since Saturday. She has a history of CHF and COPD. She takes Lasix at home. She denies coughing up any phlegm. Patient wears oxygen at home and sats are above 90% at rest and drops to 80s on exertion. Patient is on 3 L oxygen at home. She had cataract surgery last week. Denies fever, no nausea vomiting or diarrhea. No dysuria urgency frequency of urination.  Hospital Course:   SOB -Etiology remains unclear. She does have chronic diastolic CHF and COPD and has  been noncompliant with her home oxygen. -VQ scan was low probability for PE. -She is feeling well, is on her baseline level of oxygen and is anxious to be discharged home today. -instructed on compliance with home oxygen.  Rest of chronic medical conditions have been stable and home medications have not been altered.   Procedures:  None   Consultations:  None  Discharge Instructions  Discharge Instructions    Diet - low sodium heart healthy    Complete by:  As directed      Increase activity slowly    Complete by:  As directed             Medication List    STOP taking these medications        erythromycin ophthalmic ointment     NON FORMULARY      TAKE these medications        albuterol 108 (90 BASE) MCG/ACT inhaler  Commonly known as:  PROVENTIL HFA;VENTOLIN HFA  Inhale 2 puffs into the lungs 3 (three) times daily.     albuterol (2.5 MG/3ML) 0.083% nebulizer solution  Commonly known as:  PROVENTIL  Take 3 mLs (2.5 mg total) by nebulization every 4 (four) hours as needed for wheezing or shortness of breath.     alendronate 70 MG tablet  Commonly known as:  FOSAMAX  TAKE 1 TABLET BY MOUTH 1 TIME WEEKLY AS DIRECTED     amLODipine 5 MG tablet  Commonly known as:  NORVASC  Take 2.5 mg by mouth daily.     aspirin 81 MG EC tablet  Take 81 mg by mouth every morning.  BEANO Tabs  Take 2 tablets by mouth 3 times/day as needed-between meals & bedtime (gas).     budesonide-formoterol 160-4.5 MCG/ACT inhaler  Commonly known as:  SYMBICORT  Inhale 2 puffs into the lungs 2 (two) times daily.     CALCIUM 600 PO  Take 1 tablet by mouth daily.     citalopram 40 MG tablet  Commonly known as:  CELEXA  TAKE 1 TABLET BY MOUTH EVERY MORNING     donepezil 10 MG tablet  Commonly known as:  ARICEPT  TAKE 1 TABLET BY MOUTH EVERY NIGHT AT BEDTIME     DUREZOL 0.05 % Emul  Generic drug:  Difluprednate  PLACE 1 DROP IN RIGHT EYE TWICE DAILY FOR 1 WEEK, THEN ONCE  DAILY FOR 1 WEEK. START THE DAY OF SURGERY     furosemide 40 MG tablet  Commonly known as:  LASIX  Take 2 tablets every morning by mouth.     gabapentin 100 MG capsule  Commonly known as:  NEURONTIN  Take 100 mg by mouth 3 (three) times daily.     gentamicin 0.3 % ophthalmic solution  Commonly known as:  GARAMYCIN  INJECT ONE DROP IN THE RIGHT EYE 4 TIMES DAILY  BEGINNING AFTER SURGERY     LORazepam 1 MG tablet  Commonly known as:  ATIVAN  Take 1 mg by mouth at bedtime as needed for sleep (Take 1/2 to 1 tablet by mouth at bedtime as needed for sleep).     losartan 100 MG tablet  Commonly known as:  COZAAR  TAKE 1 TABLET BY MOUTH EVERY DAY     nitroGLYCERIN 0.4 MG SL tablet  Commonly known as:  NITROSTAT  Place 1 tablet (0.4 mg total) under the tongue as needed for chest pain.     polyethylene glycol packet  Commonly known as:  MIRALAX / GLYCOLAX  Take 17 g by mouth daily as needed for moderate constipation.     potassium chloride 10 MEQ tablet  Commonly known as:  K-DUR,KLOR-CON  TAKE 1 TABLET(10 MEQ) BY MOUTH TWICE DAILY     pravastatin 40 MG tablet  Commonly known as:  PRAVACHOL  TAKE 1 TABLET BY MOUTH EVERY DAY     TRAVATAN Z 0.004 % Soln ophthalmic solution  Generic drug:  Travoprost (BAK Free)  Place 1 drop into the left eye at bedtime.     valACYclovir 1000 MG tablet  Commonly known as:  VALTREX  Take 1 tablet by mouth 3 (three) times daily. Start taking 3 days before surgery     Vitamin D3 1000 UNITS Caps  Take 1 capsule by mouth daily.       Allergies  Allergen Reactions  . Acetaminophen     Made her feel like her heart was pounding  . Codeine Nausea And Vomiting  . Dilaudid [Hydromorphone Hcl] Other (See Comments)    Extremely sensitive  . Erythromycin Other (See Comments)    Intense stomach pain  . Sulfonamide Derivatives     unknown       Follow-up Information    Follow up with Lilyan Punt, MD. Schedule an appointment as soon as possible  for a visit in 2 weeks.   Specialty:  Family Medicine   Contact information:   [address] [place] [address] [phone]        The results of significant diagnostics from this hospitalization (including imaging, microbiology, ancillary and laboratory) are listed below for reference.    Significant Diagnostic  Studies: Dg Chest 2 View  01/05/2015  CLINICAL DATA:  Increased shortness of breath on exertion for 3 days with chest tightness. EXAM: CHEST  2 VIEW COMPARISON:  06/07/2014. FINDINGS: Patient is rotated, trachea is midline. Heart is enlarged, stable. Thoracic aorta is calcified. Biapical pleural thickening. Lungs appear somewhat hyperinflated with minimal volume loss in the left lower lobe, with an elevated left hemidiaphragm, stable. No definite pleural fluid. Chronic lower thoracic compression fracture. IMPRESSION: Hyperinflation without acute finding. Electronically Signed   By: Leanna Battles M.D.   On: 01/05/2015 18:19   US Carotid Bilateral  12/18/2014  CLINICAL DATA:  Known bilateral carotid artery stenosis. History of hypertension, hyperlipidemia, CAD (post myocardial infarction) and COPD. Former smoker. EXAM: BILATERAL CAROTID DUPLEX ULTRASOUND TECHNIQUE: Wallace Cullens scale imaging, color Doppler and duplex ultrasound were performed of bilateral carotid and vertebral arteries in the neck. COMPARISON:  None. FINDINGS: Criteria: Quantification of carotid stenosis is based on velocity parameters that correlate the residual internal carotid diameter with NASCET-based stenosis levels, using the diameter of the distal internal carotid lumen as the denominator for stenosis measurement. The following velocity measurements were obtained: RIGHT ICA:  193/37 cm/sec CCA:  96/15 cm/sec SYSTOLIC ICA/CCA RATIO:  2.0 DIASTOLIC ICA/CCA RATIO:  2.5 ECA:  324 cm/sec LEFT ICA:  269/45 cm/sec CCA:  107/19 cm/sec SYSTOLIC ICA/CCA RATIO:  2.5 DIASTOLIC ICA/CCA RATIO:  2.3 ECA:  366 cm/sec RIGHT  CAROTID ARTERY: There is a moderate amount of eccentric mixed echogenic plaque within the right carotid bulb (representative images 19, 20 and 22), extending to involve the origin and proximal aspects of the right internal carotid artery (images 30 and 42) which results in elevated peak systolic velocities throughout the interrogated course of the right internal carotid artery (greatest acquired peak systolic velocity within the mid aspect of the right internal carotid artery measures 193 cm/sec - image 52). RIGHT VERTEBRAL ARTERY:  Antegrade flow LEFT CAROTID ARTERY: There is a minimal to moderate amount of eccentric mixed echogenic plaque scattered throughout the left common carotid artery (representative images 60, 66 and 72). There is a moderate amount of eccentric mixed echogenic partially shadowing plaque within left carotid bulb (images 77, 79 and 81). There is a large amount of eccentric mixed echogenic plaque involving the origin and proximal aspects of the left internal carotid artery (image 105) which results in elevated peak systolic velocities throughout the proximal and mid aspects of the left internal carotid artery (greatest acquired peak systolic velocity within the proximal ICA measures 269 cm/sec - image 110). LEFT VERTEBRAL ARTERY:  Antegrade flow Incidental note is made of a cardiac arrhythmia. IMPRESSION: 1. Moderate to large amount of atherosclerotic plaque, left greater than right, resulting in elevated peak systolic velocities within the bilateral internal carotid arteries correlated with the greater than 70% luminal narrowing on the left and within the higher end of the 50-69% luminal narrowing on the right. Further evaluation with CTA could be performed as clinically indicated. 2. Antegrade flow is demonstrated within the bilateral vertebral arteries. 3. Incidental note is made of a cardiac arrhythmia. Further evaluation with ECG monitoring could be performed as clinically indicated.  Electronically Signed   By: Simonne Come M.D.   On: 12/18/2014 17:01   Nm Pulmonary Perf And Vent  01/06/2015  CLINICAL DATA:  Short of breath EXAM: NUCLEAR MEDICINE VENTILATION - PERFUSION LUNG SCAN TECHNIQUE: Ventilation images were obtained in multiple projections using inhaled aerosol Tc-57m DTPA. Perfusion images were obtained in multiple projections after intravenous injection  of Tc-70m MAA. RADIOPHARMACEUTICALS:  30.2 millicurie Technetium-12m DTPA aerosol inhalation and chest x-ray 01/05/2015 3.8 millicurie of Technetium-63m MAA IV COMPARISON:  Perfusion study reveals no significant perfusion defect. There is symmetric perfusion of both lungs Ventilation study shows clumping of radiopharmaceutical. No segmental ventilation defect. FINDINGS: Ventilation: No focal ventilation defect. Perfusion: No wedge shaped peripheral perfusion defects to suggest acute pulmonary embolism. IMPRESSION: Low probability for  pulmonary embolism. Electronically Signed   By: Marlan Palau M.D.   On: 01/06/2015 14:14    Microbiology: No results found for this or any previous visit (from the past 240 hour(s)).   Labs: Basic Metabolic Panel:  Recent Labs Lab 01/05/15 1807 01/06/15 0523  NA 140 142  K 3.9 3.8  CL 99* 103  CO2 30 31  GLUCOSE 111* 100*  BUN 28* 26*  CREATININE 1.75* 1.52*  CALCIUM 8.7* 8.5*   Liver Function Tests:  Recent Labs Lab 01/05/15 1807 01/06/15 0523  AST 19 15  ALT 14 13*  ALKPHOS 55 43  BILITOT 0.4 0.4  PROT 7.3 6.5  ALBUMIN 4.2 3.7   No results for input(s): LIPASE, AMYLASE in the last 168 hours. No results for input(s): AMMONIA in the last 168 hours. CBC:  Recent Labs Lab 01/05/15 1807 01/06/15 0523  WBC 4.7 4.0  NEUTROABS 3.4  --   HGB 11.4* 10.5*  HCT 34.1* 31.9*  MCV 88.1 87.9  PLT 177 171   Cardiac Enzymes:  Recent Labs Lab 01/05/15 1807 01/06/15 0014 01/06/15 0523 01/06/15 1118  TROPONINI 0.03 0.03 0.03 0.03   BNP: BNP (last 3  results)  Recent Labs  04/10/14 1433 06/07/14 0321 01/05/15 1807  BNP 989.0* 582.0* 348.0*    ProBNP (last 3 results) No results for input(s): PROBNP in the last 8760 hours.  CBG: No results for input(s): GLUCAP in the last 168 hours.     SignedChaya Jan  [facility] Pager: [phone] 01/06/2015, 2:56 PM

## 2015-01-06 NOTE — Care Management Obs Status (Signed)
MEDICARE OBSERVATION STATUS NOTIFICATION   Patient Details  Name: [name] MRN: [id number] Date of Birth: [birth year]   Medicare Observation Status Notification Given:  Yes    Cheryl Flash, RN 01/06/2015, 11:58 AM

## 2015-01-07 ENCOUNTER — Other Ambulatory Visit: Payer: Self-pay | Admitting: Family Medicine

## 2015-01-12 ENCOUNTER — Other Ambulatory Visit: Payer: Self-pay | Admitting: Family Medicine

## 2015-01-14 DIAGNOSIS — H34832 Tributary (branch) retinal vein occlusion, left eye, with macular edema: Secondary | ICD-10-CM | POA: Diagnosis not present

## 2015-01-15 ENCOUNTER — Telehealth: Payer: Self-pay | Admitting: Family Medicine

## 2015-01-15 NOTE — Telephone Encounter (Signed)
[facility] is needing a surgical clearance for Cataract Extraction on 12/15 How do you wish to proceed with this? Form at your desk for review   Please advise

## 2015-01-15 NOTE — Telephone Encounter (Signed)
I cannot approve preoperative clearance at this point without the patient coming in for an office visit. Highly recommended that the patient come in next week. Please set her up for next week. Please also keep preoperative form available so that it will E presented to me at the time of her visit

## 2015-01-16 NOTE — Telephone Encounter (Signed)
Please call pt and schedule ov for next week with dr Lorin Picket

## 2015-01-16 NOTE — Telephone Encounter (Signed)
Form to be kept at nurses station

## 2015-01-16 NOTE — Telephone Encounter (Signed)
appt made 12/6

## 2015-01-20 ENCOUNTER — Encounter: Payer: Self-pay | Admitting: Family Medicine

## 2015-01-20 ENCOUNTER — Ambulatory Visit (INDEPENDENT_AMBULATORY_CARE_PROVIDER_SITE_OTHER): Payer: Medicare Other | Admitting: Family Medicine

## 2015-01-20 VITALS — BP 134/62 | HR 62 | Ht 63.0 in | Wt 172.0 lb

## 2015-01-20 DIAGNOSIS — J45909 Unspecified asthma, uncomplicated: Secondary | ICD-10-CM

## 2015-01-20 DIAGNOSIS — I6523 Occlusion and stenosis of bilateral carotid arteries: Secondary | ICD-10-CM

## 2015-01-20 DIAGNOSIS — J449 Chronic obstructive pulmonary disease, unspecified: Secondary | ICD-10-CM | POA: Diagnosis not present

## 2015-01-20 NOTE — Progress Notes (Signed)
   Subjective:    Patient ID: [name], female    DOB: 1935/12/11, 79 y.o.   MRN: [id number]  HPIpt arrives for surgical clearance. Having cataract extraction on dec 15th.   Pt states no other concerns today.  The patient is on oxygen but her O2 sats stay good. She does have history of COPD and reactive airway has not had any flareup of this recently. In addition to this patient does have history of coronary artery disease but this is stable currently. In addition to this history of hyperglycemia. Does have some mild early dementia findings as well. Patient is able to get around her house with oxygen without difficulty.   Review of Systems    patient gets short of breath without her oxygen but otherwise denies shortness of breath no cough no vomiting no high fevers denies bloody stools relates some joint discomforts in her back and her knees. Objective:   Physical Exam Neck no masses lungs are clear heart regular pulse normal extremities no edema skin warm dry neurologic grossly normal       Assessment & Plan:  COPD stable patient is continue her current medications on a regular basis  Patient to follow-up by March  Patient is cleared for surgery. The surgery is a cataract extraction with IV sedation. Patient should continue her oxygen.she does not need to alter her medications while having this type of surgery. Should there be any questions or concerns I encouraged surgeon or anesthesia to call us. The form was filled out this note along with the form being faxed.

## 2015-01-20 NOTE — Patient Instructions (Signed)
You are good for your surgery. I will send a note to your doctor.  Next visit here in 2 to 3 months  Call us if any health issues

## 2015-01-29 DIAGNOSIS — H2512 Age-related nuclear cataract, left eye: Secondary | ICD-10-CM | POA: Diagnosis not present

## 2015-01-29 DIAGNOSIS — H25812 Combined forms of age-related cataract, left eye: Secondary | ICD-10-CM | POA: Diagnosis not present

## 2015-02-05 ENCOUNTER — Ambulatory Visit: Payer: Medicare Other | Admitting: Family Medicine

## 2015-02-10 ENCOUNTER — Ambulatory Visit: Payer: Medicare Other | Admitting: Family Medicine

## 2015-02-11 DIAGNOSIS — H34832 Tributary (branch) retinal vein occlusion, left eye, with macular edema: Secondary | ICD-10-CM | POA: Diagnosis not present

## 2015-02-11 DIAGNOSIS — H04123 Dry eye syndrome of bilateral lacrimal glands: Secondary | ICD-10-CM | POA: Diagnosis not present

## 2015-02-18 ENCOUNTER — Other Ambulatory Visit: Payer: Self-pay | Admitting: Family Medicine

## 2015-03-04 ENCOUNTER — Other Ambulatory Visit: Payer: Self-pay | Admitting: Family Medicine

## 2015-03-18 DIAGNOSIS — H34832 Tributary (branch) retinal vein occlusion, left eye, with macular edema: Secondary | ICD-10-CM | POA: Diagnosis not present

## 2015-03-22 ENCOUNTER — Other Ambulatory Visit: Payer: Self-pay | Admitting: Family Medicine

## 2015-04-11 ENCOUNTER — Other Ambulatory Visit: Payer: Self-pay | Admitting: Family Medicine

## 2015-04-13 ENCOUNTER — Other Ambulatory Visit: Payer: Self-pay | Admitting: Family Medicine

## 2015-04-20 ENCOUNTER — Ambulatory Visit (INDEPENDENT_AMBULATORY_CARE_PROVIDER_SITE_OTHER): Payer: Medicare Other | Admitting: Family Medicine

## 2015-04-20 ENCOUNTER — Encounter: Payer: Self-pay | Admitting: Family Medicine

## 2015-04-20 VITALS — BP 120/70 | Ht 63.0 in | Wt 180.1 lb

## 2015-04-20 DIAGNOSIS — N183 Chronic kidney disease, stage 3 unspecified: Secondary | ICD-10-CM

## 2015-04-20 DIAGNOSIS — J45909 Unspecified asthma, uncomplicated: Secondary | ICD-10-CM | POA: Diagnosis not present

## 2015-04-20 DIAGNOSIS — R7303 Prediabetes: Secondary | ICD-10-CM

## 2015-04-20 DIAGNOSIS — J449 Chronic obstructive pulmonary disease, unspecified: Secondary | ICD-10-CM

## 2015-04-20 DIAGNOSIS — J4489 Other specified chronic obstructive pulmonary disease: Secondary | ICD-10-CM

## 2015-04-20 DIAGNOSIS — E785 Hyperlipidemia, unspecified: Secondary | ICD-10-CM

## 2015-04-20 DIAGNOSIS — I5032 Chronic diastolic (congestive) heart failure: Secondary | ICD-10-CM | POA: Diagnosis not present

## 2015-04-20 DIAGNOSIS — I10 Essential (primary) hypertension: Secondary | ICD-10-CM

## 2015-04-20 NOTE — Patient Instructions (Signed)
Please do fasting blood work in March !!  Recheck in 6 months

## 2015-04-20 NOTE — Progress Notes (Signed)
   Subjective:    Patient ID: [name], female    DOB: 08/12/1935, 80 y.o.   MRN: [id number]  Hypertension This is a chronic problem. The current episode started more than 1 year ago. The problem has been gradually improving since onset. There are no associated agents to hypertension. There are no known risk factors for coronary artery disease. Treatments tried: amlodipine. The current treatment provides moderate improvement. There are no compliance problems.    Patient does get short of breath with significant activity. Denies PND. Patient relates compliance with medicines. Compliance with oxygen. States breathing fair  Denies rectal bleeding vomiting.  25 minutes was spent with the patient. Greater than half the time was spent in discussion and answering questions and counseling regarding the issues that the patient came in for today.  Patient states that she has a bad runny nose. Treatments tried: zyrtec with no relief.   Review of Systems She does relate shortness of breath at times she uses supplemental oxygen. She states her pain control overall is doing well. The patient also relates significant allergy issues taking couple Zyrtec a day along with Flonase without much help    Objective:   Physical Exam Lungs clear hearts regular pulse normal extremities no edema skin warm dry       Assessment & Plan:  1. Essential hypertension Blood pressure decent control continue current measures. Patient denies any orthostasis. - Lipid panel - Hepatic function panel - Basic metabolic panel - Hemoglobin A1c  2. COPD with asthma (HCC) Progressive COPD. Continue inhalers. Continue oxygen. Follow-up if problems. - Lipid panel - Hepatic function panel - Basic metabolic panel - Hemoglobin A1c  3. Hyperlipidemia Patient does need lipid profile. Watch diet closely. - Lipid panel - Hepatic function panel - Basic metabolic panel - Hemoglobin A1c  4. Prediabetes Patient needs  A1c watch diet minimize starches - Lipid panel - Hepatic function panel - Basic metabolic panel - Hemoglobin A1c  5. CKD (chronic kidney disease) stage 3, GFR 30-59 ml/min Renal dysfunction check metabolic 7 - Lipid panel - Hepatic function panel - Basic metabolic panel - Hemoglobin A1c  6. Chronic diastolic congestive heart failure (HCC) Diastolic congestive heart failure stable continue cardiac medicines follow-up of problems if weight goes up dramatically to let us know.  Dementia is stable continue current medication

## 2015-04-21 DIAGNOSIS — E785 Hyperlipidemia, unspecified: Secondary | ICD-10-CM | POA: Diagnosis not present

## 2015-04-21 DIAGNOSIS — R7309 Other abnormal glucose: Secondary | ICD-10-CM | POA: Diagnosis not present

## 2015-04-21 DIAGNOSIS — I10 Essential (primary) hypertension: Secondary | ICD-10-CM | POA: Diagnosis not present

## 2015-04-21 DIAGNOSIS — J449 Chronic obstructive pulmonary disease, unspecified: Secondary | ICD-10-CM | POA: Diagnosis not present

## 2015-04-21 DIAGNOSIS — J45909 Unspecified asthma, uncomplicated: Secondary | ICD-10-CM | POA: Diagnosis not present

## 2015-04-21 DIAGNOSIS — R7303 Prediabetes: Secondary | ICD-10-CM | POA: Diagnosis not present

## 2015-04-21 DIAGNOSIS — N183 Chronic kidney disease, stage 3 (moderate): Secondary | ICD-10-CM | POA: Diagnosis not present

## 2015-04-22 ENCOUNTER — Encounter: Payer: Self-pay | Admitting: Family Medicine

## 2015-04-22 DIAGNOSIS — H34832 Tributary (branch) retinal vein occlusion, left eye, with macular edema: Secondary | ICD-10-CM | POA: Diagnosis not present

## 2015-04-22 LAB — HEMOGLOBIN A1C
ESTIMATED AVERAGE GLUCOSE: 120 mg/dL
Hgb A1c MFr Bld: 5.8 % — ABNORMAL HIGH (ref 4.8–5.6)

## 2015-04-22 LAB — BASIC METABOLIC PANEL
BUN/Creatinine Ratio: 17 (ref 11–26)
BUN: 25 mg/dL (ref 8–27)
CALCIUM: 8.9 mg/dL (ref 8.7–10.3)
CO2: 26 mmol/L (ref 18–29)
CREATININE: 1.51 mg/dL — AB (ref 0.57–1.00)
Chloride: 98 mmol/L (ref 96–106)
GFR, EST AFRICAN AMERICAN: 38 mL/min/{1.73_m2} — AB (ref >59)
GFR, EST NON AFRICAN AMERICAN: 33 mL/min/{1.73_m2} — AB (ref >59)
Glucose: 121 mg/dL — ABNORMAL HIGH (ref 65–99)
POTASSIUM: 4.6 mmol/L (ref 3.5–5.2)
Sodium: 139 mmol/L (ref 134–144)

## 2015-04-22 LAB — HEPATIC FUNCTION PANEL
ALBUMIN: 4.6 g/dL (ref 3.5–4.8)
ALK PHOS: 63 IU/L (ref 39–117)
ALT: 11 IU/L (ref 0–32)
AST: 15 IU/L (ref 0–40)
BILIRUBIN TOTAL: 0.3 mg/dL (ref 0.0–1.2)
Bilirubin, Direct: 0.11 mg/dL (ref 0.00–0.40)
TOTAL PROTEIN: 7 g/dL (ref 6.0–8.5)

## 2015-04-22 LAB — LIPID PANEL
CHOLESTEROL TOTAL: 160 mg/dL (ref 100–199)
Chol/HDL Ratio: 3.8 ratio units (ref 0.0–4.4)
HDL: 42 mg/dL (ref >39)
LDL CALC: 92 mg/dL (ref 0–99)
Triglycerides: 131 mg/dL (ref 0–149)
VLDL CHOLESTEROL CAL: 26 mg/dL (ref 5–40)

## 2015-04-26 ENCOUNTER — Other Ambulatory Visit: Payer: Self-pay | Admitting: Family Medicine

## 2015-04-27 NOTE — Telephone Encounter (Signed)
Pelvis and 4 refills

## 2015-04-28 MED ORDER — LORAZEPAM 1 MG PO TABS: ORAL_TABLET | ORAL | Status: DC

## 2015-05-27 DIAGNOSIS — H34832 Tributary (branch) retinal vein occlusion, left eye, with macular edema: Secondary | ICD-10-CM | POA: Diagnosis not present

## 2015-05-31 ENCOUNTER — Other Ambulatory Visit: Payer: Self-pay | Admitting: Family Medicine

## 2015-07-01 DIAGNOSIS — H34832 Tributary (branch) retinal vein occlusion, left eye, with macular edema: Secondary | ICD-10-CM | POA: Diagnosis not present

## 2015-07-09 ENCOUNTER — Other Ambulatory Visit: Payer: Self-pay | Admitting: Family Medicine

## 2015-07-09 NOTE — Telephone Encounter (Signed)
It is fine to do 6 months worth a refill but the difficult part is her Epic shows that amlodipine is 2.5 not 5 mg. This request is for 5 mg I believe it is an old work old prescription at the pharmacy not her current it would be wise to confirm this with the patient and family before filling to avoid a potential mistake please clarify and make sure Epic lists correctly reflects heard dosing thank you

## 2015-07-16 ENCOUNTER — Telehealth: Payer: Self-pay | Admitting: Family Medicine

## 2015-07-16 NOTE — Telephone Encounter (Signed)
Misty Stanley called in reference to a bill that [place] received from [employer] on DOS: 04/21/15.  She says the [phone] was denied.  The code we had, R73.03, did not get the code covered.  I updated the code to R73.09 and notified Stacey.

## 2015-07-20 ENCOUNTER — Telehealth: Payer: Self-pay | Admitting: Family Medicine

## 2015-07-20 NOTE — Telephone Encounter (Signed)
I reviewed over the medications. I really do not see where adjusting current medications wouldn't necessarily be the wisest thing to do or the right thing to do. I believe this patient needs further evaluation. This may be something we will be able to help or we may need to refer to psychiatry for further assistance. I would recommend a follow-up office visit somewhere within the next couple weeks with myself or [name]

## 2015-07-20 NOTE — Telephone Encounter (Signed)
Misty Stanley calling to let you know that Metha has been having bad mood swings Hateful behavior, screaming and fussing, saying her oxygen is not working  But it is. Misty Stanley wonders if her meds need to be adjusted or do you need to see her?  [place]

## 2015-07-21 NOTE — Telephone Encounter (Signed)
Discussed with Misty Stanley and advised her that Dr Lorin Picket recommended an office visit for evaluation. Misty Stanley stated she will call back and schedule appt after talking with [facility].

## 2015-08-06 ENCOUNTER — Ambulatory Visit: Payer: Medicare Other | Admitting: Nurse Practitioner

## 2015-08-06 DIAGNOSIS — H34832 Tributary (branch) retinal vein occlusion, left eye, with macular edema: Secondary | ICD-10-CM | POA: Diagnosis not present

## 2015-08-06 DIAGNOSIS — H34232 Retinal artery branch occlusion, left eye: Secondary | ICD-10-CM | POA: Diagnosis not present

## 2015-08-10 ENCOUNTER — Ambulatory Visit (HOSPITAL_COMMUNITY)
Admission: RE | Admit: 2015-08-10 | Discharge: 2015-08-10 | Disposition: A | Discharge status: Home / Self Care | Payer: Medicare Other | Source: Ambulatory Visit | Attending: Nurse Practitioner | Admitting: Nurse Practitioner

## 2015-08-10 ENCOUNTER — Ambulatory Visit (HOSPITAL_BASED_OUTPATIENT_CLINIC_OR_DEPARTMENT_OTHER): Payer: Medicare Other

## 2015-08-10 ENCOUNTER — Other Ambulatory Visit: Payer: Self-pay

## 2015-08-10 ENCOUNTER — Encounter: Payer: Self-pay | Admitting: Nurse Practitioner

## 2015-08-10 ENCOUNTER — Ambulatory Visit (INDEPENDENT_AMBULATORY_CARE_PROVIDER_SITE_OTHER): Payer: Medicare Other | Admitting: Nurse Practitioner

## 2015-08-10 VITALS — BP 124/62 | HR 52 | Ht 63.0 in | Wt 184.2 lb

## 2015-08-10 DIAGNOSIS — I5032 Chronic diastolic (congestive) heart failure: Secondary | ICD-10-CM | POA: Diagnosis not present

## 2015-08-10 DIAGNOSIS — I130 Hypertensive heart and chronic kidney disease with heart failure and stage 1 through stage 4 chronic kidney disease, or unspecified chronic kidney disease: Secondary | ICD-10-CM | POA: Insufficient documentation

## 2015-08-10 DIAGNOSIS — N183 Chronic kidney disease, stage 3 unspecified: Secondary | ICD-10-CM

## 2015-08-10 DIAGNOSIS — I6523 Occlusion and stenosis of bilateral carotid arteries: Secondary | ICD-10-CM | POA: Insufficient documentation

## 2015-08-10 DIAGNOSIS — I2510 Atherosclerotic heart disease of native coronary artery without angina pectoris: Secondary | ICD-10-CM

## 2015-08-10 DIAGNOSIS — I129 Hypertensive chronic kidney disease with stage 1 through stage 4 chronic kidney disease, or unspecified chronic kidney disease: Secondary | ICD-10-CM

## 2015-08-10 DIAGNOSIS — I351 Nonrheumatic aortic (valve) insufficiency: Secondary | ICD-10-CM | POA: Insufficient documentation

## 2015-08-10 LAB — ECHOCARDIOGRAM COMPLETE
AO mean calculated velocity dopler: 157 cm/s
AV Area VTI index: 0.82 cm2/m2
AV Area VTI: 1.63 cm2
AV Area mean vel: 1.46 cm2
AV Mean grad: 12 mmHg
AV Peak grad: 19 mmHg
AV VEL mean LVOT/AV: 0.47
AV area mean vel ind: 0.78 cm2/m2
AV peak Index: 0.87
AV pk vel: 218 cm/s
AV vel: 1.53
Ao pk vel: 0.52 m/s
E decel time: 335 msec
E/e' ratio: 19.27
FS: 35 % (ref 28–44)
Height: 63 in
IVS/LV PW RATIO, ED: 1.01
LA ID, A-P, ES: 38 mm
LA diam end sys: 38 mm
LA diam index: 2.03 cm/m2
LA vol A4C: 45 ml
LA vol index: 27.3 mL/m2
LA vol: 51 mL
LV E/e' medial: 19.27
LV E/e'average: 19.27
LV PW d: 14.5 mm — AB (ref 0.6–1.1)
LV e' LATERAL: 5.45 cm/s
LVOT SV: 87 mL
LVOT VTI: 27.8 cm
LVOT area: 3.14 cm2
LVOT diameter: 20 mm
LVOT peak VTI: 0.49 cm
LVOT peak grad rest: 5 mmHg
LVOT peak vel: 113 cm/s
MV Dec: 335
MV Peak grad: 4 mmHg
MV pk A vel: 91.3 m/s
MV pk E vel: 105 m/s
P 1/2 time: 397 ms
TDI e' lateral: 5.45
TDI e' medial: 4.5
VTI: 56.9 cm
Valve area index: 0.82
Valve area: 1.53 cm2
Weight: 2947.2 oz

## 2015-08-10 LAB — BASIC METABOLIC PANEL
BUN: 27 mg/dL — ABNORMAL HIGH (ref 7–25)
CO2: 31 mmol/L (ref 20–31)
Calcium: 8.2 mg/dL — ABNORMAL LOW (ref 8.6–10.4)
Chloride: 101 mmol/L (ref 98–110)
Creat: 1.67 mg/dL — ABNORMAL HIGH (ref 0.60–0.93)
Glucose, Bld: 117 mg/dL — ABNORMAL HIGH (ref 65–99)
Potassium: 4.3 mmol/L (ref 3.5–5.3)
Sodium: 141 mmol/L (ref 135–146)

## 2015-08-10 LAB — CBC
HCT: 31.4 % — ABNORMAL LOW (ref 35.0–45.0)
Hemoglobin: 10.2 g/dL — ABNORMAL LOW (ref 11.7–15.5)
MCH: 27.5 pg (ref 27.0–33.0)
MCHC: 32.5 g/dL (ref 32.0–36.0)
MCV: 84.6 fL (ref 80.0–100.0)
MPV: 9.8 fL (ref 7.5–12.5)
Platelets: 174 10*3/uL (ref 140–400)
RBC: 3.71 MIL/uL — ABNORMAL LOW (ref 3.80–5.10)
RDW: 14.1 % (ref 11.0–15.0)
WBC: 5 10*3/uL (ref 3.8–10.8)

## 2015-08-10 MED ORDER — ISOSORBIDE MONONITRATE ER 30 MG PO TB24: 15.0000 mg | ORAL_TABLET | Freq: Every day | ORAL | Status: DC

## 2015-08-10 NOTE — Patient Instructions (Addendum)
We will be checking the following labs today - STAT BMET & CBC   Medication Instructions:    Continue with your current medicines. BUT  I am adding Imdur 30 mg - to take just 1/2 a pill a day - this has been sent to your drug store    Testing/Procedures To Be Arranged:  CT angio of the neck with and without contrast - follow up carotid disease ? Of emboli  [facility]ogram at [facility] unless it can be done here today  Follow-Up:   See me on a day that Dr. Excell Seltzer is here in one month    Other Special Instructions:   N/A    If you need a refill on your cardiac medications before your next appointment, please call your pharmacy.   Call the [facility] office at [phone] if you have any questions, problems or concerns.

## 2015-08-10 NOTE — Progress Notes (Addendum)
CARDIOLOGY OFFICE NOTE  Date:  08/10/2015    [name] Date of Birth: 1935/10/07 Medical Record #[id number]  PCP:  Lilyan Punt, MD  Cardiologist:  Kirt Boys    Chief Complaint  Patient presents with  . Coronary Artery Disease  . Congestive Heart Failure  . Hypertension    Work in visit due to recent eye exam - seen for Dr. Excell Seltzer    History of Present Illness: [name] is a 80 y.o. female who presents today for a work in visit. Seen for Dr. Excell Seltzer.   She has CAD and diastolic heart failure. She has coronary artery disease status post PCI of the LAD in 2011. She underwent cardiac catheterization in 2014 demonstrating patency of her stent site in the LAD. There was no other evidence of obstructive disease noted. Ventriculography was suggestive of apical hypertrophy. A cardiac MRI suggests a variant of hypertrophic cardiomyopathy as well. The patient has COPD and is now O2 dependent.  Her other issues include dementia, HTN, COPD, HLD and diastolic HF. She has been noted to be quite sedentary and has trouble adhering to salt restriction.   I last saw her back last August. Saw Dr. Excell Seltzer last October - she remains short of breath with activity. Non compliant with salt restriction.   Comes back today. Here with her daughter. Her grand daughter - Kennyth Arnold - works in [place] here. She has been to the eye doctor - ? Of embolus - has branch retinal vein occlusion of the left eye noted with macular edema . Referred back here.  No recent echo.  Remains short of breath - says this is getting worse. She is on statin therapy for her lipids - Pravachol. Now on full dose aspirin therapy. Daughter notes more irritability. Getting harder to take care of her in a general fashion. Also noted to have more chest pain - with and without exertion - does use some NTG - hard to quantify. Daughter feels like she is having more chest pain than normal. Apparently it is getting harder and harder  to take care of her according to the family. Still gets too much salt.   Past Medical History  Diagnosis Date  . CAD (coronary artery disease) 08/2009    s/p PCI of the LAD  . Myocardial infarction (HCC)     Non-st segment elevated  . Hypertension   . Hyperlipidemia   . Bradycardia   . Carotid artery stenosis   . COPD (chronic obstructive pulmonary disease) (HCC)   . Chronic headache   . Asthma   . Pneumonia   . Ulnar neuropathy   . Seasonal allergies   . Impaired fasting glucose   . Dementia     patient denies this  . Osteoporosis 2009  . Acute on chronic diastolic heart failure (HCC) 04/11/2014    Past Surgical History  Procedure Laterality Date  . Partial hysterectomy  1984  . Radial artery catheter      Bladder  . Bladder surgery  1991  . Cardiac catheterization  11/2010    negative  . Left heart catheterization with coronary angiogram N/A 11/05/2012    Procedure: LEFT HEART CATHETERIZATION WITH CORONARY ANGIOGRAM;  Surgeon: Micheline Chapman, MD;  Location: [facility] CATH [facility];  Service: Cardiovascular;  Laterality: N/A;  . Cataract extraction  Nov 2016     Medications: Current Outpatient Prescriptions  Medication Sig Dispense Refill  . aspirin 325 MG EC tablet Take 325 mg by  mouth daily.    Marland Kitchen albuterol (PROVENTIL HFA;VENTOLIN HFA) 108 (90 BASE) MCG/ACT inhaler Inhale 2 puffs into the lungs 3 (three) times daily. 1 Inhaler 3  . albuterol (PROVENTIL) (2.5 MG/3ML) 0.083% nebulizer solution Take 3 mLs (2.5 mg total) by nebulization every 4 (four) hours as needed for wheezing or shortness of breath. 75 mL 2  . alendronate (FOSAMAX) 70 MG tablet TAKE 1 TABLET BY MOUTH 1 TIME WEEKLY AS DIRECTED 12 tablet 0  . Alpha-D-Galactosidase (BEANO) TABS Take 2 tablets by mouth 3 times/day as needed-between meals & bedtime (gas).     Marland Kitchen amLODipine (NORVASC) 5 MG tablet Take 2.5 mg by mouth daily.    Marland Kitchen amLODipine (NORVASC) 5 MG tablet TAKE 1/2 TABLET BY MOUTH DAILY 45 tablet 5  .  budesonide-formoterol (SYMBICORT) 160-4.5 MCG/ACT inhaler Inhale 2 puffs into the lungs 2 (two) times daily. 1 Inhaler 12  . Calcium Carbonate (CALCIUM 600 PO) Take 1 tablet by mouth daily.     . Cholecalciferol (VITAMIN D3) 1000 UNITS CAPS Take 1 capsule by mouth daily.    . citalopram (CELEXA) 40 MG tablet TAKE 1 TABLET BY MOUTH EVERY MORNING 90 tablet 0  . citalopram (CELEXA) 40 MG tablet TAKE 1 TABLET BY MOUTH EVERY DAY 90 tablet 1  . donepezil (ARICEPT) 10 MG tablet TAKE 1 TABLET BY MOUTH EVERY NIGHT AT BEDTIME 90 tablet 1  . DUREZOL 0.05 % EMUL PLACE 1 DROP IN RIGHT EYE TWICE DAILY FOR 1 WEEK, THEN ONCE DAILY FOR 1 WEEK. START THE DAY OF SURGERY  2  . furosemide (LASIX) 40 MG tablet Take 2 tablets every morning by mouth. 60 tablet 6  . gabapentin (NEURONTIN) 100 MG capsule TAKE 1 CAPSULE(100 MG) BY MOUTH THREE TIMES DAILY 90 capsule 5  . gentamicin (GARAMYCIN) 0.3 % ophthalmic solution INJECT ONE DROP IN THE RIGHT EYE 4 TIMES DAILY  BEGINNING AFTER SURGERY  1  . isosorbide mononitrate (IMDUR) 30 MG 24 hr tablet Take 0.5 tablets (15 mg total) by mouth daily. 30 tablet 3  . LORazepam (ATIVAN) 1 MG tablet TAKE 1/2 TABLET TO 1 TABLET BY MOUTH EVERY NIGHT AT BEDTIME AS NEEDED 30 tablet 4  . losartan (COZAAR) 100 MG tablet TAKE 1 TABLET BY MOUTH EVERY DAY 90 tablet 0  . nitroGLYCERIN (NITROSTAT) 0.4 MG SL tablet Place 1 tablet (0.4 mg total) under the tongue as needed for chest pain. 25 tablet 0  . polyethylene glycol (MIRALAX / GLYCOLAX) packet Take 17 g by mouth daily as needed for moderate constipation.    . potassium chloride (K-DUR,KLOR-CON) 10 MEQ tablet TAKE 1 TABLET(10 MEQ) BY MOUTH TWICE DAILY 60 tablet 5  . pravastatin (PRAVACHOL) 40 MG tablet TAKE 1 TABLET BY MOUTH EVERY DAY 90 tablet 0  . TRAVATAN Z 0.004 % SOLN ophthalmic solution Place 1 drop into the left eye at bedtime.     . valACYclovir (VALTREX) 1000 MG tablet Take 1 tablet by mouth 3 (three) times daily. Start taking 3 days  before surgery  0   No current facility-administered medications for this visit.    Allergies: Allergies  Allergen Reactions  . Acetaminophen     Made her feel like her heart was pounding  . Codeine Nausea And Vomiting  . Dilaudid [Hydromorphone Hcl] Other (See Comments)    Extremely sensitive  . Erythromycin Other (See Comments)    Intense stomach pain  . Sulfonamide Derivatives     unknown    Social History: The patient  reports  that she quit smoking about 7 years ago. She has never used smokeless tobacco. She reports that she does not drink alcohol or use illicit drugs.   Family History: The patient's family history includes Addison's disease in her mother; Alcohol abuse in her father.   Review of Systems: Please see the history of present illness.   Otherwise, the review of systems is positive for none.   All other systems are reviewed and negative.   Physical Exam: VS:  BP 124/62 mmHg  Pulse 52  Ht  (1.6 m)  Wt 184 lb 3.2 oz (83.553 kg)  BMI 32.64 kg/m2 .  BMI Body mass index is 32.64 kg/(m^2).  Wt Readings from Last 3 Encounters:  08/10/15 184 lb 3.2 oz (83.553 kg)  04/20/15 180 lb 2 oz (81.704 kg)  01/20/15 172 lb (78.019 kg)    General: Pleasant. Elderly female who is alert and in no acute distress. Her weight is up.  HEENT: Normal. Neck: Supple, no JVD, carotid bruits, or masses noted.  Cardiac: Heart tones are distant.  No edema.  Respiratory:  Lungs with decreased breath sounds but normal work of breathing at rest. She has oxygen in place. GI: Soft and nontender.  MS: No deformity or atrophy. Gait and ROM intact. Skin: Warm and dry. Color is normal.  Neuro:  Strength and sensation are intact and no gross focal deficits noted.  Psych: Alert, appropriate and with normal affect.   LABORATORY DATA:  EKG:  EKG is ordered today. This demonstrates sinus brady with PACs. She has lateral ST/T wave changes.  Lab Results  Component Value Date   WBC 4.0  01/06/2015   HGB 10.5* 01/06/2015   HCT 31.9* 01/06/2015   PLT 171 01/06/2015   GLUCOSE 121* 04/21/2015   CHOL 160 04/21/2015   TRIG 131 04/21/2015   HDL 42 04/21/2015   LDLCALC 92 04/21/2015   ALT 11 04/21/2015   AST 15 04/21/2015   NA 139 04/21/2015   K 4.6 04/21/2015   CL 98 04/21/2015   CREATININE 1.51* 04/21/2015   BUN 25 04/21/2015   CO2 26 04/21/2015   TSH 3.086 12/06/2013   INR 1.11 05/07/2013   HGBA1C 5.8* 04/21/2015    BNP (last 3 results)  Recent Labs  01/05/15 1807  BNP 348.0*    ProBNP (last 3 results) No results for input(s): PROBNP in the last 8760 hours.   Other Studies Reviewed Today:  Echo Study Conclusions from 03/2014  - Left ventricle: The cavity size was normal. Wall thickness was increased in a pattern of mild LVH. Systolic function was vigorous. The estimated ejection fraction was in the range of 65% to 70%. Wall motion was normal; there were no regional wall motion abnormalities. Features are consistent with a pseudonormal left ventricular filling pattern, with concomitant abnormal relaxation and increased filling pressure (grade 2 diastolic dysfunction). - Aortic valve: Mildly calcified annulus. Trileaflet; mildly thickened leaflets. There was trivial regurgitation. Valve area (VTI): 1.83 cm^2. Valve area (Vmax): 1.74 cm^2. Valve area (Vmean): 2.02 cm^2. - Left atrium: The atrium was moderately dilated. - Right atrium: The atrium was mildly dilated. - Pericardium, extracardiac: There is a large left pleural effusion. There was no pericardial effusion. - Technically difficult study.   CAROTID DOPPLER IMPRESSION FROM 12/2014:  1. Moderate to large amount of atherosclerotic plaque, left greater than right, resulting in elevated peak systolic velocities within the bilateral internal carotid arteries correlated with the greater than 70% luminal narrowing on the left and  within the higher end of the 50-69% luminal  narrowing on the right. Further evaluation with CTA could be performed as clinically indicated. 2. Antegrade flow is demonstrated within the bilateral vertebral arteries. 3. Incidental note is made of a cardiac arrhythmia. Further evaluation with ECG monitoring could be performed as clinically indicated.   Electronically Signed  By: Simonne Come M.D.  On: 12/18/2014 17:01  Assessment/Plan:  1. Abnormal eye exam - noted branch retinal vein occlusion of left eye - will arrange CTA of the neck to further evaluate the carotids. Arranging for echocardiogram as well. Further disposition to follow. Stat BMET drawn.   2. CAD, native vessel, with symptoms of angina: Having more symptoms - would favor more of a medical management. Will try low dose Imdur. May need to consider Myoview but would hope to manage conservatively.   3. Chronic diastolic heart failure, New [place] Heart Association Class 3: Shortness of breath is a combination of lung disease and diastolic heart failure. The patient remains limited. She is now on home O2. Breathing seems to be getting worse. Not sure there will be much we can do.   4. Carotid stenosis: will get CTA of the neck to further define.   5. Hypertension with chronic kidney disease stage III: She remains on a combination of amlodipine and losartan. Her beta blocker had to be stopped because of marked bradycardia.  Current medicines are reviewed with the patient today.  The patient does not have concerns regarding medicines other than what has been noted above.  The following changes have been made:  See above.  Labs/ tests ordered today include:    Orders Placed This Encounter  Procedures  . CT ANGIO NECK W OR WO CONTRAST  . Basic metabolic panel  . CBC  . ECHOCARDIOGRAM COMPLETE     Disposition:   FU with me in one month on a day that Dr. Excell Seltzer is here in the office.    Patient is agreeable to this plan and will call if any problems develop in  the interim.   Signed: Rosalio Macadamia, RN, ANP-C 08/10/2015 9:11 AM  [facility] [address] [place], [place]  [address] Phone: [phone] Fax: [phone]        Addendum: Labs obtained. Creatinine is 1.67. Not able to do CTA. Will repeat carotid duplex. Further disposition to follow.   Rosalio Macadamia, RN, ANP-C [facility] [address] [place], [place]  [address] [phone]

## 2015-08-10 NOTE — Addendum Note (Signed)
Addended by: Jarvis Newcomer on: 08/10/2015 11:54 AM   Modules accepted: Orders

## 2015-08-10 NOTE — Addendum Note (Signed)
Addended by: Rosalio Macadamia on: 08/10/2015 12:02 PM   Modules accepted: Kipp Brood

## 2015-08-11 NOTE — Progress Notes (Signed)
I have had Dr. Excell Seltzer review my note and the echo  "Yeah I had read your note and I totally agree with you. I guess the only other thing I can think of is to screen for AF with a 30 day monitor. If she has PAF we would anticoagulate her. Otherwise I think your plan is good. She is just going down hill slowly - has been declining for the last several years. Appreciate you seeing her"  I have talked with Kennyth Arnold - she will talk with her mom and they will discuss it further. She is worried that she will not wear as directed. She will let me know.   Rosalio Macadamia, RN, ANP-C [facility] [address] [place], [place]  [address] [phone]

## 2015-08-12 ENCOUNTER — Other Ambulatory Visit: Payer: Self-pay | Admitting: *Deleted

## 2015-08-12 DIAGNOSIS — I5031 Acute diastolic (congestive) heart failure: Secondary | ICD-10-CM

## 2015-08-12 DIAGNOSIS — R001 Bradycardia, unspecified: Secondary | ICD-10-CM

## 2015-08-13 ENCOUNTER — Telehealth: Payer: Self-pay | Admitting: Family Medicine

## 2015-08-13 NOTE — Telephone Encounter (Signed)
Granddaughter stated that she did not want to cancel the appt next week till after speaking with you on the phone first.

## 2015-08-13 NOTE — Telephone Encounter (Signed)
I do not have a good answer. I will be able to give a brief phone call to the patient-5-10 minutes maximum. I cannot do a full office visit over the phone. Is the family still following up next week?

## 2015-08-13 NOTE — Telephone Encounter (Signed)
Pt's granddaughter is requesting a phone call from Dr. Lorin Picket. Granddaughter Misty Stanley) showed up today for an appt with Dr. Lorin Picket. The appt is scheduled for next Thursday at 10 am. Granddaughter took time off work today and stated she is unable to take off again. Please advise.

## 2015-08-15 ENCOUNTER — Other Ambulatory Visit: Payer: Self-pay | Admitting: Family Medicine

## 2015-08-17 ENCOUNTER — Ambulatory Visit (INDEPENDENT_AMBULATORY_CARE_PROVIDER_SITE_OTHER): Payer: Medicare Other

## 2015-08-17 ENCOUNTER — Ambulatory Visit: Payer: Medicare Other | Admitting: Family Medicine

## 2015-08-17 ENCOUNTER — Other Ambulatory Visit: Payer: Self-pay | Admitting: Nurse Practitioner

## 2015-08-17 DIAGNOSIS — H34832 Tributary (branch) retinal vein occlusion, left eye, with macular edema: Secondary | ICD-10-CM | POA: Diagnosis not present

## 2015-08-17 DIAGNOSIS — R001 Bradycardia, unspecified: Secondary | ICD-10-CM

## 2015-08-17 DIAGNOSIS — I4891 Unspecified atrial fibrillation: Secondary | ICD-10-CM

## 2015-08-20 ENCOUNTER — Ambulatory Visit (INDEPENDENT_AMBULATORY_CARE_PROVIDER_SITE_OTHER): Payer: Medicare Other | Admitting: Family Medicine

## 2015-08-20 ENCOUNTER — Other Ambulatory Visit: Payer: Self-pay | Admitting: Family Medicine

## 2015-08-20 VITALS — Ht 63.0 in

## 2015-08-20 DIAGNOSIS — F0391 Unspecified dementia with behavioral disturbance: Secondary | ICD-10-CM | POA: Diagnosis not present

## 2015-08-20 DIAGNOSIS — I6523 Occlusion and stenosis of bilateral carotid arteries: Secondary | ICD-10-CM

## 2015-08-20 MED ORDER — SERTRALINE HCL 100 MG PO TABS: 150.0000 mg | ORAL_TABLET | Freq: Every day | ORAL | Status: DC

## 2015-08-20 NOTE — Progress Notes (Signed)
   Subjective:    Patient ID: [name], female    DOB: February 10, 1936, 80 y.o.   MRN: [id number]  HPI Patient's daughter is in today to discuss patient's mental status. Patient is not currently with daughter. 15-20 minutes spent with the daughter discussing neuropsychiatric issues. The patient is aggressive at times irritable at times combative. Patient also has Alzheimer's. Because of these issues patient becoming difficult to manage. Family is trying to get a to come in and sit with the patient for 3-4 hours daily to help out. Family is also trying to manage her by keeping her in entertained as much is possible.  Review of Systems     Objective:   Physical Exam        Assessment & Plan:  Alzheimer's with behavioral issues-nonmedication behavioral approaches were discussed. Is also apparent that the patient will need some medications to help out as well  At this point I'm very hesitant to use any type of antipsychotic because of increased risk of stroke and premature death I would recommend instead that we stopped Celexa and try Zoloft 100 mg tablet 1-1/2 daily. I would like to see this patient in follow-up within 2 weeks time. The daughter was spoken with and will make sure that this happens. In addition to this I would like for the patient to bring all of her medications with her for review

## 2015-08-21 ENCOUNTER — Other Ambulatory Visit: Payer: Self-pay | Admitting: Family Medicine

## 2015-08-31 ENCOUNTER — Other Ambulatory Visit: Payer: Self-pay | Admitting: Family Medicine

## 2015-09-07 ENCOUNTER — Encounter: Payer: Self-pay | Admitting: Family Medicine

## 2015-09-07 ENCOUNTER — Ambulatory Visit (INDEPENDENT_AMBULATORY_CARE_PROVIDER_SITE_OTHER): Payer: Medicare Other | Admitting: Family Medicine

## 2015-09-07 VITALS — BP 136/80 | Ht 63.0 in | Wt 182.0 lb

## 2015-09-07 DIAGNOSIS — M810 Age-related osteoporosis without current pathological fracture: Secondary | ICD-10-CM | POA: Diagnosis not present

## 2015-09-07 DIAGNOSIS — J441 Chronic obstructive pulmonary disease with (acute) exacerbation: Secondary | ICD-10-CM

## 2015-09-07 DIAGNOSIS — G250 Essential tremor: Secondary | ICD-10-CM | POA: Diagnosis not present

## 2015-09-07 DIAGNOSIS — I10 Essential (primary) hypertension: Secondary | ICD-10-CM

## 2015-09-07 DIAGNOSIS — D692 Other nonthrombocytopenic purpura: Secondary | ICD-10-CM | POA: Diagnosis not present

## 2015-09-07 DIAGNOSIS — F0391 Unspecified dementia with behavioral disturbance: Secondary | ICD-10-CM | POA: Diagnosis not present

## 2015-09-07 DIAGNOSIS — Z780 Asymptomatic menopausal state: Secondary | ICD-10-CM | POA: Diagnosis not present

## 2015-09-07 DIAGNOSIS — I6523 Occlusion and stenosis of bilateral carotid arteries: Secondary | ICD-10-CM | POA: Diagnosis not present

## 2015-09-07 MED ORDER — PREDNISONE 20 MG PO TABS: ORAL_TABLET | ORAL | 0 refills | Status: DC

## 2015-09-07 MED ORDER — ALBUTEROL SULFATE HFA 108 (90 BASE) MCG/ACT IN AERS: 2.0000 | INHALATION_SPRAY | Freq: Three times a day (TID) | RESPIRATORY_TRACT | 3 refills | Status: DC

## 2015-09-07 NOTE — Progress Notes (Signed)
   Subjective:    Patient ID: [name], female    DOB: 1935-11-13, 80 y.o.   MRN: [id number]  HPIpt arrives with daughter Chryl Heck.    Follow up on alzheimer's.  Takes her medicine on a regular basis memory fair moods are doing a little bit better with the medication although not great Pt states she is short winded today. 02 with oxygen 94%  Hands shaky. Started several months ago.  To some degree has difficult time feeding herself. Needs refill on albuterol inhaler. Has been out for awhile.  Needs a refill. Review of Systems Relates some coughing some wheezing some shortness breath tremors in the hand denies headache denies fevers chills sweats denies nausea vomiting diarrhea    Objective:   Physical Exam Lungs are clear hearts regular pulse normal moderate benign essential tremor patient can walk but mild ataxia she was encouraged walk with a cane       Assessment & Plan:  Benign essential tremor-no medication indicated currently Alzheimer's stable on current medicine Patient does have mood disorder but I believe she is doing his best that she can with her current medications will not add further at this time Mild COPD flareup prednisone taper use albuterol, no antibiotic indicated Recheck patient in a proximally 4 months Bone density more than likely will take patient off Fosamax Senile purpura recommend her not to take ibuprofen with the aspirin

## 2015-09-17 ENCOUNTER — Other Ambulatory Visit: Payer: Self-pay | Admitting: Family Medicine

## 2015-09-24 DIAGNOSIS — H34832 Tributary (branch) retinal vein occlusion, left eye, with macular edema: Secondary | ICD-10-CM | POA: Diagnosis not present

## 2015-09-28 ENCOUNTER — Other Ambulatory Visit: Payer: Self-pay | Admitting: Family Medicine

## 2015-09-29 ENCOUNTER — Other Ambulatory Visit: Payer: Self-pay | Admitting: Family Medicine

## 2015-09-30 ENCOUNTER — Other Ambulatory Visit: Payer: Self-pay | Admitting: Family Medicine

## 2015-09-30 ENCOUNTER — Ambulatory Visit (HOSPITAL_COMMUNITY)
Admission: RE | Admit: 2015-09-30 | Discharge: 2015-09-30 | Disposition: A | Discharge status: Home / Self Care | Payer: Medicare Other | Source: Ambulatory Visit | Attending: Family Medicine | Admitting: Family Medicine

## 2015-09-30 DIAGNOSIS — Z780 Asymptomatic menopausal state: Secondary | ICD-10-CM | POA: Diagnosis not present

## 2015-09-30 DIAGNOSIS — M85852 Other specified disorders of bone density and structure, left thigh: Secondary | ICD-10-CM | POA: Diagnosis not present

## 2015-09-30 NOTE — Telephone Encounter (Signed)
Six mo worth each

## 2015-10-07 ENCOUNTER — Telehealth: Payer: Self-pay | Admitting: *Deleted

## 2015-10-07 NOTE — Telephone Encounter (Signed)
DPR for daughter Rinaldo Cloud who has been notified of monitor results for pt by phone with verbal understanding.

## 2015-10-12 ENCOUNTER — Telehealth: Payer: Self-pay | Admitting: Family Medicine

## 2015-10-12 ENCOUNTER — Other Ambulatory Visit: Payer: Self-pay | Admitting: Family Medicine

## 2015-10-12 MED ORDER — ALBUTEROL SULFATE (2.5 MG/3ML) 0.083% IN NEBU: 2.5000 mg | INHALATION_SOLUTION | RESPIRATORY_TRACT | 2 refills | Status: DC | PRN

## 2015-10-12 MED ORDER — ALBUTEROL SULFATE (2.5 MG/3ML) 0.083% IN NEBU: 2.5000 mg | INHALATION_SOLUTION | RESPIRATORY_TRACT | 2 refills | Status: AC | PRN

## 2015-10-12 NOTE — Telephone Encounter (Signed)
Prescription sent electronically to pharmacy. Patient notified. 

## 2015-10-12 NOTE — Telephone Encounter (Signed)
Pt is needing a refill on her albuterol (PROVENTIL) (2.5 MG/3ML) 0.083% nebulizer solution    [facility]

## 2015-10-15 ENCOUNTER — Ambulatory Visit (INDEPENDENT_AMBULATORY_CARE_PROVIDER_SITE_OTHER): Payer: Medicare Other | Admitting: Family Medicine

## 2015-10-15 VITALS — BP 118/74 | Temp 98.2°F | Wt 181.0 lb

## 2015-10-15 DIAGNOSIS — J45909 Unspecified asthma, uncomplicated: Secondary | ICD-10-CM | POA: Diagnosis not present

## 2015-10-15 DIAGNOSIS — I6523 Occlusion and stenosis of bilateral carotid arteries: Secondary | ICD-10-CM | POA: Diagnosis not present

## 2015-10-15 DIAGNOSIS — F0391 Unspecified dementia with behavioral disturbance: Secondary | ICD-10-CM | POA: Diagnosis not present

## 2015-10-15 DIAGNOSIS — J441 Chronic obstructive pulmonary disease with (acute) exacerbation: Secondary | ICD-10-CM

## 2015-10-15 DIAGNOSIS — R0602 Shortness of breath: Secondary | ICD-10-CM | POA: Diagnosis not present

## 2015-10-15 DIAGNOSIS — J449 Chronic obstructive pulmonary disease, unspecified: Secondary | ICD-10-CM

## 2015-10-15 MED ORDER — CEFTRIAXONE SODIUM 1 G IJ SOLR
500.0000 mg | Freq: Once | INTRAMUSCULAR | Status: AC
  Administered 2015-10-15: 500 mg via INTRAMUSCULAR

## 2015-10-15 MED ORDER — PREDNISONE 20 MG PO TABS: ORAL_TABLET | ORAL | 0 refills | Status: DC

## 2015-10-15 MED ORDER — AMOXICILLIN-POT CLAVULANATE 875-125 MG PO TABS: 1.0000 | ORAL_TABLET | Freq: Two times a day (BID) | ORAL | 0 refills | Status: DC

## 2015-10-15 MED ORDER — METHYLPREDNISOLONE ACETATE 40 MG/ML IJ SUSP
40.0000 mg | Freq: Once | INTRAMUSCULAR | Status: AC
  Administered 2015-10-15: 40 mg via INTRAMUSCULAR

## 2015-10-15 NOTE — Progress Notes (Signed)
   Subjective:    Patient ID: [name], female    DOB: 1935-09-24, 80 y.o.   MRN: [id number]  Cough  This is a new problem. Episode onset: 3 days. Associated symptoms include wheezing. Associated symptoms comments: Cough .  02 after walking to room was 72 with 2 liters of oxygen. After sitting about a few minutes came up to 90.   Patient has known COPD. Last several days she's had progressive symptoms next  Increased cough.  Increase shortness of breath with exertion.  Starting to use the nebulizer every 6 hours or so.  Cough is productive.  Last night had some challenges on and on through the night. Good appetite today.  Patient fairly adamant about not wanting to go to the hospital.  Review of Systems  Respiratory: Positive for cough and wheezing.   No high fevers cough generally nonproductive no rash no chills no urinary symptoms     Objective:   Physical Exam Alert oxygen present slight tachypnea at baseline. No active wheezing except with cough. Intermittent cough no crackles heart regular in rhythm ankles trace edema       Assessment & Plan:  Impression moderately severe COPD with hypoxia with exertion. O2 does get back into the low 90s with rest. Productive cough. On the edge of needing to go to the emergency room but feel reasonable to hold off for now. Steroids antibiotics maintain oxygen close observation from family warning signs discussed WSL

## 2015-10-16 ENCOUNTER — Other Ambulatory Visit: Payer: Self-pay

## 2015-10-21 ENCOUNTER — Encounter: Payer: Self-pay | Admitting: Family Medicine

## 2015-10-21 ENCOUNTER — Ambulatory Visit (INDEPENDENT_AMBULATORY_CARE_PROVIDER_SITE_OTHER): Payer: Medicare Other | Admitting: Family Medicine

## 2015-10-21 VITALS — BP 122/78 | Ht 63.0 in | Wt 178.6 lb

## 2015-10-21 DIAGNOSIS — J441 Chronic obstructive pulmonary disease with (acute) exacerbation: Secondary | ICD-10-CM

## 2015-10-21 DIAGNOSIS — I6523 Occlusion and stenosis of bilateral carotid arteries: Secondary | ICD-10-CM

## 2015-10-21 DIAGNOSIS — I10 Essential (primary) hypertension: Secondary | ICD-10-CM

## 2015-10-21 NOTE — Progress Notes (Signed)
   Subjective:    Patient ID: [name], female    DOB: [birth year], 80 y.o.   MRN: [id number]  Hypertension  This is a chronic problem. The current episode started more than 1 year ago. Risk factors for coronary artery disease include post-menopausal state. Treatments tried: norvasc, imdur, cozaar. There are no compliance problems.    Patient recently treated for COPD. Actually doing fairly well with this. She is tolerating the antibiotics she got a shot of steroids. Uses Symbicort and albuterol  Alzheimer's them behavior is been doing well. Stable   Review of Systems    see above no fevers no difficulty breathing in the past few days no chest pain Objective:   Physical Exam Lungs clear heart regular pulse normal extremities no edema skin warm dry slight tremor noted       Assessment & Plan:  The patient has a follow-up in October we'll do a flu shot at that time and recheck chronic health issues  Recent COPD flareup doing much better. Continue Symbicort. Use albuterol when necessary oxygen saturation looks good  Blood pressure good control currently

## 2015-10-29 DIAGNOSIS — H34832 Tributary (branch) retinal vein occlusion, left eye, with macular edema: Secondary | ICD-10-CM | POA: Diagnosis not present

## 2015-11-06 ENCOUNTER — Other Ambulatory Visit: Payer: Self-pay | Admitting: Family Medicine

## 2015-12-02 ENCOUNTER — Other Ambulatory Visit: Payer: Self-pay | Admitting: Family Medicine

## 2015-12-03 ENCOUNTER — Ambulatory Visit: Payer: Medicare Other | Admitting: Family Medicine

## 2015-12-03 ENCOUNTER — Encounter (HOSPITAL_COMMUNITY): Payer: Self-pay

## 2015-12-03 ENCOUNTER — Emergency Department (HOSPITAL_COMMUNITY): Payer: Medicare Other

## 2015-12-03 ENCOUNTER — Observation Stay (HOSPITAL_COMMUNITY)
Admission: EM | Admit: 2015-12-03 | Discharge: 2015-12-04 | Disposition: A | Discharge status: Home / Self Care | Payer: Medicare Other | Attending: Internal Medicine | Admitting: Internal Medicine

## 2015-12-03 ENCOUNTER — Telehealth: Payer: Self-pay | Admitting: Family Medicine

## 2015-12-03 DIAGNOSIS — I5032 Chronic diastolic (congestive) heart failure: Secondary | ICD-10-CM | POA: Diagnosis present

## 2015-12-03 DIAGNOSIS — J449 Chronic obstructive pulmonary disease, unspecified: Secondary | ICD-10-CM | POA: Diagnosis not present

## 2015-12-03 DIAGNOSIS — M25551 Pain in right hip: Secondary | ICD-10-CM | POA: Diagnosis not present

## 2015-12-03 DIAGNOSIS — E785 Hyperlipidemia, unspecified: Secondary | ICD-10-CM | POA: Diagnosis present

## 2015-12-03 DIAGNOSIS — Z79899 Other long term (current) drug therapy: Secondary | ICD-10-CM | POA: Insufficient documentation

## 2015-12-03 DIAGNOSIS — S2241XA Multiple fractures of ribs, right side, initial encounter for closed fracture: Secondary | ICD-10-CM | POA: Diagnosis not present

## 2015-12-03 DIAGNOSIS — J9610 Chronic respiratory failure, unspecified whether with hypoxia or hypercapnia: Secondary | ICD-10-CM | POA: Diagnosis present

## 2015-12-03 DIAGNOSIS — J9611 Chronic respiratory failure with hypoxia: Secondary | ICD-10-CM

## 2015-12-03 DIAGNOSIS — I2510 Atherosclerotic heart disease of native coronary artery without angina pectoris: Secondary | ICD-10-CM | POA: Insufficient documentation

## 2015-12-03 DIAGNOSIS — Z7982 Long term (current) use of aspirin: Secondary | ICD-10-CM | POA: Insufficient documentation

## 2015-12-03 DIAGNOSIS — S0990XA Unspecified injury of head, initial encounter: Secondary | ICD-10-CM | POA: Diagnosis not present

## 2015-12-03 DIAGNOSIS — Y92002 Bathroom of unspecified non-institutional (private) residence single-family (private) house as the place of occurrence of the external cause: Secondary | ICD-10-CM | POA: Diagnosis not present

## 2015-12-03 DIAGNOSIS — J45909 Unspecified asthma, uncomplicated: Secondary | ICD-10-CM | POA: Diagnosis not present

## 2015-12-03 DIAGNOSIS — N183 Chronic kidney disease, stage 3 unspecified: Secondary | ICD-10-CM | POA: Diagnosis present

## 2015-12-03 DIAGNOSIS — W19XXXA Unspecified fall, initial encounter: Secondary | ICD-10-CM | POA: Diagnosis not present

## 2015-12-03 DIAGNOSIS — R109 Unspecified abdominal pain: Secondary | ICD-10-CM

## 2015-12-03 DIAGNOSIS — S3991XA Unspecified injury of abdomen, initial encounter: Secondary | ICD-10-CM | POA: Diagnosis not present

## 2015-12-03 DIAGNOSIS — S2239XA Fracture of one rib, unspecified side, initial encounter for closed fracture: Secondary | ICD-10-CM | POA: Diagnosis present

## 2015-12-03 DIAGNOSIS — M6281 Muscle weakness (generalized): Secondary | ICD-10-CM

## 2015-12-03 DIAGNOSIS — Z23 Encounter for immunization: Secondary | ICD-10-CM | POA: Insufficient documentation

## 2015-12-03 DIAGNOSIS — Y939 Activity, unspecified: Secondary | ICD-10-CM | POA: Insufficient documentation

## 2015-12-03 DIAGNOSIS — I130 Hypertensive heart and chronic kidney disease with heart failure and stage 1 through stage 4 chronic kidney disease, or unspecified chronic kidney disease: Secondary | ICD-10-CM | POA: Diagnosis not present

## 2015-12-03 DIAGNOSIS — Z87891 Personal history of nicotine dependence: Secondary | ICD-10-CM | POA: Diagnosis not present

## 2015-12-03 DIAGNOSIS — F0390 Unspecified dementia without behavioral disturbance: Secondary | ICD-10-CM | POA: Diagnosis not present

## 2015-12-03 DIAGNOSIS — J90 Pleural effusion, not elsewhere classified: Secondary | ICD-10-CM | POA: Diagnosis not present

## 2015-12-03 DIAGNOSIS — S2249XA Multiple fractures of ribs, unspecified side, initial encounter for closed fracture: Secondary | ICD-10-CM | POA: Diagnosis present

## 2015-12-03 DIAGNOSIS — S199XXA Unspecified injury of neck, initial encounter: Secondary | ICD-10-CM | POA: Diagnosis not present

## 2015-12-03 DIAGNOSIS — S79911A Unspecified injury of right hip, initial encounter: Secondary | ICD-10-CM | POA: Diagnosis not present

## 2015-12-03 DIAGNOSIS — S299XXA Unspecified injury of thorax, initial encounter: Secondary | ICD-10-CM | POA: Diagnosis present

## 2015-12-03 DIAGNOSIS — R079 Chest pain, unspecified: Secondary | ICD-10-CM

## 2015-12-03 DIAGNOSIS — Y999 Unspecified external cause status: Secondary | ICD-10-CM | POA: Insufficient documentation

## 2015-12-03 DIAGNOSIS — I10 Essential (primary) hypertension: Secondary | ICD-10-CM | POA: Diagnosis present

## 2015-12-03 HISTORY — DX: Dependence on supplemental oxygen: Z99.81

## 2015-12-03 LAB — CBC WITH DIFFERENTIAL/PLATELET
BASOS ABS: 0 10*3/uL (ref 0.0–0.1)
BASOS PCT: 0 %
EOS ABS: 0.1 10*3/uL (ref 0.0–0.7)
EOS PCT: 2 %
HCT: 33.4 % — ABNORMAL LOW (ref 36.0–46.0)
Hemoglobin: 10.4 g/dL — ABNORMAL LOW (ref 12.0–15.0)
LYMPHS PCT: 7 %
Lymphs Abs: 0.5 10*3/uL — ABNORMAL LOW (ref 0.7–4.0)
MCH: 27.3 pg (ref 26.0–34.0)
MCHC: 31.1 g/dL (ref 30.0–36.0)
MCV: 87.7 fL (ref 78.0–100.0)
Monocytes Absolute: 0.5 10*3/uL (ref 0.1–1.0)
Monocytes Relative: 6 %
Neutro Abs: 6.1 10*3/uL (ref 1.7–7.7)
Neutrophils Relative %: 85 %
PLATELETS: 131 10*3/uL — AB (ref 150–400)
RBC: 3.81 MIL/uL — AB (ref 3.87–5.11)
RDW: 14.6 % (ref 11.5–15.5)
WBC: 7.2 10*3/uL (ref 4.0–10.5)

## 2015-12-03 LAB — BASIC METABOLIC PANEL
ANION GAP: 7 (ref 5–15)
BUN: 31 mg/dL — AB (ref 6–20)
CO2: 29 mmol/L (ref 22–32)
Calcium: 8.6 mg/dL — ABNORMAL LOW (ref 8.9–10.3)
Chloride: 102 mmol/L (ref 101–111)
Creatinine, Ser: 1.53 mg/dL — ABNORMAL HIGH (ref 0.44–1.00)
GFR calc Af Amer: 36 mL/min — ABNORMAL LOW (ref >60)
GFR, EST NON AFRICAN AMERICAN: 31 mL/min — AB (ref >60)
Glucose, Bld: 148 mg/dL — ABNORMAL HIGH (ref 65–99)
POTASSIUM: 4.2 mmol/L (ref 3.5–5.1)
SODIUM: 138 mmol/L (ref 135–145)

## 2015-12-03 LAB — URINALYSIS, ROUTINE W REFLEX MICROSCOPIC
Bilirubin Urine: NEGATIVE
Glucose, UA: NEGATIVE mg/dL
Hgb urine dipstick: NEGATIVE
Ketones, ur: NEGATIVE mg/dL
LEUKOCYTES UA: NEGATIVE
NITRITE: NEGATIVE
PROTEIN: NEGATIVE mg/dL
Specific Gravity, Urine: 1.025 (ref 1.005–1.030)
pH: 6 (ref 5.0–8.0)

## 2015-12-03 MED ORDER — ISOSORBIDE MONONITRATE ER 30 MG PO TB24
15.0000 mg | ORAL_TABLET | Freq: Every day | ORAL | Status: DC
  Administered 2015-12-03 – 2015-12-04 (×2): 15 mg via ORAL
  Filled 2015-12-03 (×2): qty 1

## 2015-12-03 MED ORDER — GABAPENTIN 100 MG PO CAPS
100.0000 mg | ORAL_CAPSULE | Freq: Three times a day (TID) | ORAL | Status: DC
  Administered 2015-12-03 – 2015-12-04 (×3): 100 mg via ORAL
  Filled 2015-12-03 (×3): qty 1

## 2015-12-03 MED ORDER — ACETAMINOPHEN 325 MG PO TABS: 650.0000 mg | ORAL_TABLET | Freq: Four times a day (QID) | ORAL | Status: DC | PRN

## 2015-12-03 MED ORDER — MORPHINE SULFATE (PF) 2 MG/ML IV SOLN
2.0000 mg | Freq: Once | INTRAVENOUS | Status: AC
  Administered 2015-12-03: 2 mg via INTRAVENOUS
  Filled 2015-12-03: qty 1

## 2015-12-03 MED ORDER — HYDROCODONE-ACETAMINOPHEN 5-325 MG PO TABS
1.0000 | ORAL_TABLET | Freq: Once | ORAL | Status: AC
  Administered 2015-12-03: 1 via ORAL
  Filled 2015-12-03: qty 1

## 2015-12-03 MED ORDER — LORAZEPAM 0.5 MG PO TABS
0.2500 mg | ORAL_TABLET | Freq: Every evening | ORAL | Status: DC | PRN
  Administered 2015-12-03: 0.25 mg via ORAL
  Filled 2015-12-03: qty 1

## 2015-12-03 MED ORDER — ASPIRIN 325 MG PO TABS
325.0000 mg | ORAL_TABLET | Freq: Every day | ORAL | Status: DC
  Administered 2015-12-03 – 2015-12-04 (×2): 325 mg via ORAL
  Filled 2015-12-03 (×2): qty 1

## 2015-12-03 MED ORDER — FUROSEMIDE 80 MG PO TABS
80.0000 mg | ORAL_TABLET | Freq: Every day | ORAL | Status: DC
  Administered 2015-12-03 – 2015-12-04 (×2): 80 mg via ORAL
  Filled 2015-12-03 (×2): qty 1

## 2015-12-03 MED ORDER — MOMETASONE FURO-FORMOTEROL FUM 200-5 MCG/ACT IN AERO
2.0000 | INHALATION_SPRAY | Freq: Two times a day (BID) | RESPIRATORY_TRACT | Status: DC
  Administered 2015-12-03 – 2015-12-04 (×2): 2 via RESPIRATORY_TRACT
  Filled 2015-12-03: qty 8.8

## 2015-12-03 MED ORDER — ENOXAPARIN SODIUM 40 MG/0.4ML ~~LOC~~ SOLN
40.0000 mg | SUBCUTANEOUS | Status: DC
Start: 2015-12-03 — End: 2015-12-04
  Administered 2015-12-03: 40 mg via SUBCUTANEOUS
  Filled 2015-12-03: qty 0.4

## 2015-12-03 MED ORDER — FAMOTIDINE 20 MG PO TABS
20.0000 mg | ORAL_TABLET | Freq: Every day | ORAL | Status: DC
  Administered 2015-12-03 – 2015-12-04 (×2): 20 mg via ORAL
  Filled 2015-12-03 (×2): qty 1

## 2015-12-03 MED ORDER — PRAVASTATIN SODIUM 40 MG PO TABS
40.0000 mg | ORAL_TABLET | Freq: Every day | ORAL | Status: DC
  Administered 2015-12-03 – 2015-12-04 (×2): 40 mg via ORAL
  Filled 2015-12-03 (×2): qty 1

## 2015-12-03 MED ORDER — SERTRALINE HCL 50 MG PO TABS
150.0000 mg | ORAL_TABLET | Freq: Every day | ORAL | Status: DC
  Administered 2015-12-03 – 2015-12-04 (×2): 150 mg via ORAL
  Filled 2015-12-03 (×2): qty 3

## 2015-12-03 MED ORDER — POLYETHYLENE GLYCOL 3350 17 G PO PACK: 17.0000 g | PACK | Freq: Every day | ORAL | Status: DC | PRN

## 2015-12-03 MED ORDER — PANTOPRAZOLE SODIUM 40 MG PO TBEC
40.0000 mg | DELAYED_RELEASE_TABLET | Freq: Every day | ORAL | Status: DC
  Administered 2015-12-03 – 2015-12-04 (×2): 40 mg via ORAL
  Filled 2015-12-03 (×2): qty 1

## 2015-12-03 MED ORDER — LATANOPROST 0.005 % OP SOLN
1.0000 [drp] | Freq: Every day | OPHTHALMIC | Status: DC
  Administered 2015-12-03: 1 [drp] via OPHTHALMIC
  Filled 2015-12-03: qty 2.5

## 2015-12-03 MED ORDER — LATANOPROST 0.005 % OP SOLN
OPHTHALMIC | Status: AC
  Filled 2015-12-03: qty 2.5

## 2015-12-03 MED ORDER — POTASSIUM CHLORIDE CRYS ER 10 MEQ PO TBCR
10.0000 meq | EXTENDED_RELEASE_TABLET | Freq: Two times a day (BID) | ORAL | Status: DC
  Administered 2015-12-03 – 2015-12-04 (×2): 10 meq via ORAL
  Filled 2015-12-03 (×2): qty 1

## 2015-12-03 MED ORDER — ONDANSETRON HCL 4 MG/2ML IJ SOLN: 4.0000 mg | Freq: Four times a day (QID) | INTRAMUSCULAR | Status: DC | PRN

## 2015-12-03 MED ORDER — ACETAMINOPHEN 325 MG PO TABS
650.0000 mg | ORAL_TABLET | Freq: Once | ORAL | Status: AC
  Administered 2015-12-03: 650 mg via ORAL
  Filled 2015-12-03: qty 2

## 2015-12-03 MED ORDER — ACETAMINOPHEN 650 MG RE SUPP: 650.0000 mg | Freq: Four times a day (QID) | RECTAL | Status: DC | PRN

## 2015-12-03 MED ORDER — AMLODIPINE BESYLATE 5 MG PO TABS
2.5000 mg | ORAL_TABLET | Freq: Every day | ORAL | Status: DC
  Administered 2015-12-03 – 2015-12-04 (×2): 2.5 mg via ORAL
  Filled 2015-12-03 (×2): qty 1

## 2015-12-03 MED ORDER — HYDROCODONE-ACETAMINOPHEN 5-325 MG PO TABS
1.0000 | ORAL_TABLET | ORAL | Status: DC | PRN
  Administered 2015-12-03 – 2015-12-04 (×5): 2 via ORAL
  Filled 2015-12-03 (×5): qty 2

## 2015-12-03 MED ORDER — ALBUTEROL SULFATE (2.5 MG/3ML) 0.083% IN NEBU: 2.5000 mg | INHALATION_SOLUTION | RESPIRATORY_TRACT | Status: DC | PRN

## 2015-12-03 MED ORDER — DONEPEZIL HCL 5 MG PO TABS
10.0000 mg | ORAL_TABLET | Freq: Every day | ORAL | Status: DC
  Administered 2015-12-03: 10 mg via ORAL
  Filled 2015-12-03: qty 2

## 2015-12-03 MED ORDER — LORATADINE 10 MG PO TABS
10.0000 mg | ORAL_TABLET | Freq: Every day | ORAL | Status: DC
  Administered 2015-12-04: 10 mg via ORAL
  Filled 2015-12-03 (×2): qty 1

## 2015-12-03 MED ORDER — ONDANSETRON HCL 4 MG PO TABS: 4.0000 mg | ORAL_TABLET | Freq: Four times a day (QID) | ORAL | Status: DC | PRN

## 2015-12-03 MED ORDER — LOSARTAN POTASSIUM 50 MG PO TABS
100.0000 mg | ORAL_TABLET | Freq: Every day | ORAL | Status: DC
  Administered 2015-12-03 – 2015-12-04 (×2): 100 mg via ORAL
  Filled 2015-12-03 (×2): qty 2

## 2015-12-03 NOTE — Telephone Encounter (Signed)
Please complete physician verification form and return to me so I can have the family complete their form.

## 2015-12-03 NOTE — ED Triage Notes (Signed)
Pt reports she fell in the bathroom this morning and c/o pain to r ribs and r hip.  Pt says she doesn't know why she fell but says she didn't black out and doesn't think she hit her head.  Pt has bruising to r arm.

## 2015-12-03 NOTE — ED Provider Notes (Signed)
[facility] DEPT Provider Note   CSN: [id number] Arrival date & time: 12/03/15  1324     History   Chief Complaint Chief Complaint  Patient presents with  . Fall    HPI [name] is a 80 y.o. female.  The history is provided by the patient and a relative. The history is limited by the condition of the patient (Hx dementia).  Fall    Pt was seen at 0920. Per pt and her family: Pt states she fell in the bathroom this morning. Denies syncope, states she "just fell" to the floor onto her right side. Pt was unable to stand up on her own after falling. Pt was able to stand up from the floor and walk with assistance after the fall. Pt c/o right ribs and hip "pain." Pt states she "doesn't think" she hit her head, but family at bedside states "the bathroom is small" and "she probably did." Pt has significant hx of dementia.   Past Medical History:  Diagnosis Date  . Acute on chronic diastolic heart failure (HCC) 04/11/2014  . Asthma   . Bradycardia   . CAD (coronary artery disease) 08/2009   s/p PCI of the LAD  . Carotid artery stenosis   . Chronic headache   . COPD (chronic obstructive pulmonary disease) (HCC)   . Dementia    patient denies this  . Hyperlipidemia   . Hypertension   . Impaired fasting glucose   . Myocardial infarction    Non-st segment elevated  . On home O2   . Osteoporosis 2009  . Pneumonia   . Seasonal allergies   . Ulnar neuropathy     Patient Active Problem List   Diagnosis Date Noted  . Senile purpura (HCC) 09/07/2015  . Benign essential tremor 09/07/2015  . Chronic diastolic congestive heart failure (HCC) 04/20/2015  . Renal insufficiency 04/25/2014  . Acute on chronic diastolic heart failure (HCC) 04/11/2014  . Acute on chronic respiratory failure (HCC) 04/11/2014  . CKD (chronic kidney disease) stage 3, GFR 30-59 ml/min 04/11/2014  . Prediabetes 12/03/2013  . Hypertrophic obstructive cardiomyopathy (HCC) 11/09/2013  . Hypoxia  08/28/2013  . Respiratory failure with hypoxia (HCC) 05/07/2013  . Acute diastolic CHF (congestive heart failure) (HCC) 05/07/2013  . COPD exacerbation (HCC) 02/15/2013  . Hyperlipidemia 11/09/2010  . Acute diastolic heart failure (HCC) 10/15/2010  . CAD, NATIVE VESSEL 09/18/2009  . BRADYCARDIA 08/19/2009  . Dementia 07/19/2007  . Essential hypertension 07/19/2007  . CAROTID ARTERY STENOSIS 07/19/2007  . ASTHMA 07/19/2007  . COPD with asthma (HCC) 07/19/2007  . HEADACHE, CHRONIC, HX OF 07/19/2007    Past Surgical History:  Procedure Laterality Date  . BLADDER SURGERY  1991  . CARDIAC CATHETERIZATION  11/2010   negative  . CATARACT EXTRACTION  Nov 2016  . LEFT HEART CATHETERIZATION WITH CORONARY ANGIOGRAM N/A 11/05/2012   Procedure: LEFT HEART CATHETERIZATION WITH CORONARY ANGIOGRAM;  Surgeon: Micheline Chapman, MD;  Location: [facility] CATH LAB;  Service: Cardiovascular;  Laterality: N/A;  . PARTIAL HYSTERECTOMY  1984  . Radial artery catheter     Bladder    OB History    Gravida Para Term Preterm AB Living             3   SAB TAB Ectopic Multiple Live Births                   Home Medications    Prior to Admission medications   Medication Sig Start Date  End Date Taking? Authorizing Provider  Acetaminophen (TYLENOL PO) Take by mouth.    Historical Provider, MD  albuterol (PROVENTIL HFA;VENTOLIN HFA) 108 (90 Base) MCG/ACT inhaler Inhale 2 puffs into the lungs 3 (three) times daily. 09/07/15   Babs SciaraScott A Luking, MD  albuterol (PROVENTIL) (2.5 MG/3ML) 0.083% nebulizer solution Take 3 mLs (2.5 mg total) by nebulization every 4 (four) hours as needed for wheezing or shortness of breath. 10/12/15   Babs SciaraScott A Luking, MD  albuterol (PROVENTIL) (2.5 MG/3ML) 0.083% nebulizer solution USE 1 VIAL(3 ML) IN NEBULIZER EVERY 4 HOURS AS NEEDED FOR WHEEZING OR SHORTNESS OF BREATH 10/12/15   Babs SciaraScott A Luking, MD  alendronate (FOSAMAX) 70 MG tablet TAKE 1 TABLET BY MOUTH 1 TIME WEEKLY AS DIRECTED 11/06/15    Babs SciaraScott A Luking, MD  amLODipine (NORVASC) 5 MG tablet TAKE 1/2 TABLET BY MOUTH DAILY 07/09/15   Babs SciaraScott A Luking, MD  amoxicillin-clavulanate (AUGMENTIN) 875-125 MG tablet Take 1 tablet by mouth 2 (two) times daily. 10/15/15   Merlyn AlbertWilliam S Luking, MD  aspirin 325 MG tablet Take 325 mg by mouth daily.    Historical Provider, MD  budesonide-formoterol (SYMBICORT) 160-4.5 MCG/ACT inhaler Inhale 2 puffs into the lungs 2 (two) times daily. 08/28/13   Babs SciaraScott A Luking, MD  Calcium Carbonate (CALCIUM 600 PO) Take 1 tablet by mouth daily.     Historical Provider, MD  cetirizine (ZYRTEC) 10 MG tablet Take 10 mg by mouth daily.    Historical Provider, MD  Cholecalciferol (VITAMIN D3) 1000 UNITS CAPS Take 1 capsule by mouth daily.    Historical Provider, MD  donepezil (ARICEPT) 10 MG tablet TAKE 1 TABLET BY MOUTH EVERY NIGHT AT BEDTIME 09/17/15   Scott A Luking, MD  DUREZOL 0.05 % EMUL PLACE 1 DROP IN RIGHT EYE TWICE DAILY FOR 1 WEEK, THEN ONCE DAILY FOR 1 WEEK. START THE DAY OF SURGERY 12/09/14   Historical Provider, MD  furosemide (LASIX) 40 MG tablet Take 2 tablets every morning by mouth. 01/06/15   Henderson CloudEstela Y Hernandez Acosta, MD  gabapentin (NEURONTIN) 100 MG capsule TAKE 1 CAPSULE(100 MG) BY MOUTH THREE TIMES DAILY 09/28/15   Babs SciaraScott A Luking, MD  gentamicin (GARAMYCIN) 0.3 % ophthalmic solution INJECT ONE DROP IN THE RIGHT EYE 4 TIMES DAILY  BEGINNING AFTER SURGERY 12/09/14   Historical Provider, MD  isosorbide mononitrate (IMDUR) 30 MG 24 hr tablet Take 0.5 tablets (15 mg total) by mouth daily. 08/10/15   Rosalio MacadamiaLori C Gerhardt, NP  LORazepam (ATIVAN) 1 MG tablet TAKE 1/2 TABLET TO 1 TABLET BY MOUTH EVERY NIGHT AT BEDTIME AS NEEDED 09/30/15   Merlyn AlbertWilliam S Luking, MD  losartan (COZAAR) 100 MG tablet TAKE 1 TABLET BY MOUTH EVERY DAY 09/01/15   Babs SciaraScott A Luking, MD  nitroGLYCERIN (NITROSTAT) 0.4 MG SL tablet Place 1 tablet (0.4 mg total) under the tongue as needed for chest pain. 12/18/14   Babs SciaraScott A Luking, MD  omeprazole (PRILOSEC) 20 MG  capsule Take 20 mg by mouth daily.    Historical Provider, MD  OVER THE COUNTER MEDICATION Vitamin C    Historical Provider, MD  OVER THE COUNTER MEDICATION Gas relief    Historical Provider, MD  polyethylene glycol (MIRALAX / GLYCOLAX) packet Take 17 g by mouth daily as needed for moderate constipation. 04/16/14   Elliot Cousinenise Fisher, MD  potassium chloride (K-DUR,KLOR-CON) 10 MEQ tablet TAKE 1 TABLET(10 MEQ) BY MOUTH TWICE DAILY 12/02/15   Babs SciaraScott A Luking, MD  pravastatin (PRAVACHOL) 40 MG tablet TAKE 1 TABLET BY MOUTH  EVERY DAY 11/06/15   Babs Sciara, MD  predniSONE (DELTASONE) 20 MG tablet Take 3 tabs for 3 days, then 2 tabs for 3 days, then one tab for 2 days 10/15/15   Merlyn Albert, MD  sertraline (ZOLOFT) 100 MG tablet TAKE ONE AND ONE HALF(1 1/2) TABLET BY MOUTH DAILY 08/21/15   Babs Sciara, MD  TRAVATAN Z 0.004 % SOLN ophthalmic solution Place 1 drop into the left eye at bedtime.  10/08/14   Historical Provider, MD    Family History Family History  Problem Relation Age of Onset  . Addison's disease Mother   . Alcohol abuse Father     Social History Social History  Substance Use Topics  . Smoking status: Former Smoker    Packs/day: 1.00    Years: 35.00    Quit date: 02/15/2008  . Smokeless tobacco: Never Used  . Alcohol use No     Allergies   Codeine; Dilaudid [hydromorphone hcl]; Erythromycin; and Sulfonamide derivatives   Review of Systems Review of Systems  Unable to perform ROS: Dementia     Physical Exam Updated Vital Signs BP 193/68 (BP Location: Left Arm)   Pulse 69   Temp 98.1 F (36.7 C) (Oral)   Resp (!) 28   Ht 5\' 3"  (1.6 m)   Wt 175 lb (79.4 kg)   SpO2 90%   BMI 31.00 kg/m    10:27 Orthostatic Vital Signs JM  Orthostatic Lying   BP- Lying: 163/89  Pulse- Lying: 68      Orthostatic Sitting  BP- Sitting: 151/82  Pulse- Sitting: 65      Orthostatic Standing at 0 minutes  BP- Standing at 0 minutes: 185/58  Pulse- Standing at 0 minutes: 63       Physical Exam 0925: Physical examination:  Nursing notes reviewed; Vital signs and O2 SAT reviewed;  Constitutional: Well developed, Well nourished, Well hydrated, In no acute distress; Head:  Normocephalic, atraumatic; Eyes: EOMI, PERRL, No scleral icterus; ENMT: Mouth and pharynx normal, Mucous membranes moist; Neck: Supple, Full range of motion, No lymphadenopathy; Cardiovascular: Regular rate and rhythm, No gallop; Respiratory: Breath sounds clear & equal bilaterally, No wheezes.  Speaking full sentences with ease, Normal respiratory effort/excursion; Chest: +mild TTP right upper-lateral ribs. No deformity. No ecchymosis or abrasions. Movement normal; Abdomen: Soft, Nontender, Nondistended, Normal bowel sounds; Genitourinary: No CVA tenderness; Spine:  No midline CS, TS, LS tenderness.;; Extremities: Pulses normal, Pelvis stable. NT right hip/knee/ankle/foot. NT right shoulder/elbow/wrist/hand. +scattered ecchymosis in various stages to right forearm. No deformity. No edema, No calf edema or asymmetry.; Neuro: Awake, alert, mildly confused per hx dementia. Major CN grossly intact.  No facial droop. Speech clear. No gross focal motor or sensory deficits in extremities.; Skin: Color normal, Warm, Dry.   ED Treatments / Results  Labs (all labs ordered are listed, but only abnormal results are displayed)   EKG  EKG Interpretation None       Radiology   Procedures Procedures (including critical care time)  Medications Ordered in ED Medications  acetaminophen (TYLENOL) tablet 650 mg (650 mg Oral Given 12/03/15 0937)     Initial Impression / Assessment and Plan / ED Course  I have reviewed the triage vital signs and the nursing notes.  Pertinent labs & imaging results that were available during my care of the patient were reviewed by me and considered in my medical decision making (see chart for details).  MDM Reviewed: previous chart, nursing note and vitals Reviewed  previous: labs Interpretation: x-ray, CT scan and labs   Results for orders placed or performed during the hospital encounter of 12/03/15  Basic metabolic panel  Result Value Ref Range   Sodium 138 135 - 145 mmol/L   Potassium 4.2 3.5 - 5.1 mmol/L   Chloride 102 101 - 111 mmol/L   CO2 29 22 - 32 mmol/L   Glucose, Bld 148 (H) 65 - 99 mg/dL   BUN 31 (H) 6 - 20 mg/dL   Creatinine, Ser 4.09 (H) 0.44 - 1.00 mg/dL   Calcium 8.6 (L) 8.9 - 10.3 mg/dL   GFR calc non Af Amer 31 (L) >60 mL/min   GFR calc Af Amer 36 (L) >60 mL/min   Anion gap 7 5 - 15  CBC with Differential  Result Value Ref Range   WBC 7.2 4.0 - 10.5 K/uL   RBC 3.81 (L) 3.87 - 5.11 MIL/uL   Hemoglobin 10.4 (L) 12.0 - 15.0 g/dL   HCT 81.1 (L) 91.4 - 78.2 %   MCV 87.7 78.0 - 100.0 fL   MCH 27.3 26.0 - 34.0 pg   MCHC 31.1 30.0 - 36.0 g/dL   RDW 95.6 21.3 - 08.6 %   Platelets 131 (L) 150 - 400 K/uL   Neutrophils Relative % 85 %   Neutro Abs 6.1 1.7 - 7.7 K/uL   Lymphocytes Relative 7 %   Lymphs Abs 0.5 (L) 0.7 - 4.0 K/uL   Monocytes Relative 6 %   Monocytes Absolute 0.5 0.1 - 1.0 K/uL   Eosinophils Relative 2 %   Eosinophils Absolute 0.1 0.0 - 0.7 K/uL   Basophils Relative 0 %   Basophils Absolute 0.0 0.0 - 0.1 K/uL   Ct Abdomen Pelvis Wo Contrast Result Date: 12/03/2015 CLINICAL DATA:  Fall in the bathroom, right rib and right hip pain EXAM: CT CHEST, ABDOMEN AND PELVIS WITHOUT CONTRAST TECHNIQUE: Multidetector CT imaging of the chest, abdomen and pelvis was performed following the standard protocol without IV contrast. COMPARISON:  None. FINDINGS: CT CHEST FINDINGS Cardiovascular: Mild cardiomegaly.  No pericardial effusion. Three vessel coronary atherosclerosis. Atherosclerotic calcifications of the aortic arch. Mediastinum/Nodes: No suspicious mediastinal lymphadenopathy. Visualized thyroid is grossly unremarkable. Lungs/Pleura: No suspicious pulmonary nodules. Mild centrilobular emphysematous changes, upper lobe  predominant. Mild scarring in the medial right middle lobe. Mild patchy opacity in the posterior left lower lobe and right lung base, likely atelectasis. Small bilateral pleural effusions, left greater than right. No pneumothorax. Musculoskeletal: Nondisplaced right anterior lateral 3rd rib fracture (series 2/ image 22). Nondisplaced right lateral 5th, 6th, and 7th rib fractures (series 2/ images 33, 37, and 44). Mild superior endplate compression fracture deformity at T5, age indeterminate. No retropulsion. Moderate superior endplate compression fracture deformity at T11, age indeterminate. No retropulsion. Mild degenerative changes of the lower thoracic spine. CT ABDOMEN PELVIS FINDINGS Motion degraded images. Hepatobiliary: Unenhanced liver is unremarkable. Gallbladder is unremarkable. No intrahepatic extrahepatic ductal dilatation. Pancreas: Within normal limits. Spleen: Within normal limits. Adrenals/Urinary Tract: Adrenal glands are within normal limits. Right kidney is within normal limits. Left kidney is notable for cortical scarring/ atrophy. No renal, ureteral, or bladder calculi.  No hydronephrosis. Bladder is within normal limits. Stomach/Bowel: Stomach is notable for a small hiatal hernia. No evidence of bowel obstruction. Extensive sigmoid diverticulosis, without evidence of diverticulitis. Vascular/Lymphatic: Atherosclerotic calcifications of the abdominal aorta and branch vessels. No evidence of abdominal aortic aneurysm. No suspicious abdominopelvic lymphadenopathy. Reproductive: Status post hysterectomy. Left ovary is within normal limits.  No right  adnexal mass. Other: No abdominopelvic ascites. No hemoperitoneum or free air. Musculoskeletal: Visualized osseous structures are within normal limits. No fracture is seen.  Specifically, the right hip appears intact. IMPRESSION: Nondisplaced right 3rd, 5th, 6th, and 7th rib fractures. Small bilateral pleural effusions, left greater than right. No  pneumothorax. No evidence of traumatic injury to the abdomen/ pelvis. No evidence of right hip fracture. Electronically Signed   By: Charline Bills M.D.   On: 12/03/2015 12:24   Dg Ribs Unilateral W/chest Right Result Date: 12/03/2015 CLINICAL DATA:  Pain following fall.  Shortness of breath. EXAM: RIGHT RIBS AND CHEST - 3+ VIEW COMPARISON:  January 05, 2015 FINDINGS: Frontal chest as well as oblique and cone-down lower rib images were obtained. There is no edema or consolidation. Heart is slightly enlarged with pulmonary vascularity within normal limits. There is atherosclerotic calcification in the aorta. No adenopathy. There are displaced fractures of the anterior right fifth, sixth, and seventh ribs. No pneumothorax evident. No pleural effusion. IMPRESSION: Displaced fractures of the anterior right fifth, sixth, and seventh ribs. No pneumothorax or pleural effusion. No edema or consolidation. Stable cardiac prominence. There is aortic atherosclerosis. Electronically Signed   By: Bretta Bang III M.D.   On: 12/03/2015 10:03   Ct Head Wo Contrast Result Date: 12/03/2015 CLINICAL DATA:  80 year old female status post fall in bathroom this morning. Initial encounter. EXAM: CT HEAD WITHOUT CONTRAST CT CERVICAL SPINE WITHOUT CONTRAST TECHNIQUE: Multidetector CT imaging of the head and cervical spine was performed following the standard protocol without intravenous contrast. Multiplanar CT image reconstructions of the cervical spine were also generated. COMPARISON:  Head CT without contrast 11/24/2009. FINDINGS: CT HEAD FINDINGS Brain: Patchy chronic white matter hypodensity, most pronounced in the left periatrial region, is mildly progressed since 2011. No midline shift, ventriculomegaly, mass effect, evidence of mass lesion, intracranial hemorrhage or evidence of cortically based acute infarction. Other gray-white matter differentiation is within normal limits throughout the brain. Vascular:  Calcified atherosclerosis at the skull base. Skull: Calvarium intact. Sinuses/Orbits: Stable, chronic left maxillary sinusitis. Other Visualized paranasal sinuses and mastoids are stable and well pneumatized. Other: No acute orbit or scalp soft tissue findings identified. CT CERVICAL SPINE FINDINGS Alignment: Mild motion artifact. Cervicothoracic junction alignment is within normal limits. Bilateral posterior element alignment is within normal limits. Relatively preserved cervical lordosis. Skull base and vertebrae: Visualized skull base is intact. No atlanto-occipital dissociation. No acute cervical spine fracture identified. Soft tissues and spinal canal: Negative noncontrast neck soft tissues aside from calcified carotid atherosclerosis and a retropharyngeal course of both carotids. Disc levels: Mild to moderate left upper cervical facet hypertrophy. Lower cervical chronic disc and endplate degeneration maximal at C5-C6. Mild if any associated cervical spinal stenosis. Upper chest: Grossly intact visualized upper thoracic levels. Negative lung apices. Other: None. IMPRESSION: 1. No acute intracranial abnormality. Progressed chronic cerebral white matter changes since 2011. 2. No acute fracture or listhesis identified in the cervical spine. Ligamentous injury is not excluded. Electronically Signed   By: Odessa Fleming M.D.   On: 12/03/2015 11:06   Ct Chest Wo Contrast Result Date: 12/03/2015 CLINICAL DATA:  Fall in the bathroom, right rib and right hip pain EXAM: CT CHEST, ABDOMEN AND PELVIS WITHOUT CONTRAST TECHNIQUE: Multidetector CT imaging of the chest, abdomen and pelvis was performed following the standard protocol without IV contrast. COMPARISON:  None. FINDINGS: CT CHEST FINDINGS Cardiovascular: Mild cardiomegaly.  No pericardial effusion. Three vessel coronary atherosclerosis. Atherosclerotic calcifications of the aortic arch. Mediastinum/Nodes: No  suspicious mediastinal lymphadenopathy. Visualized thyroid  is grossly unremarkable. Lungs/Pleura: No suspicious pulmonary nodules. Mild centrilobular emphysematous changes, upper lobe predominant. Mild scarring in the medial right middle lobe. Mild patchy opacity in the posterior left lower lobe and right lung base, likely atelectasis. Small bilateral pleural effusions, left greater than right. No pneumothorax. Musculoskeletal: Nondisplaced right anterior lateral 3rd rib fracture (series 2/ image 22). Nondisplaced right lateral 5th, 6th, and 7th rib fractures (series 2/ images 33, 37, and 44). Mild superior endplate compression fracture deformity at T5, age indeterminate. No retropulsion. Moderate superior endplate compression fracture deformity at T11, age indeterminate. No retropulsion. Mild degenerative changes of the lower thoracic spine. CT ABDOMEN PELVIS FINDINGS Motion degraded images. Hepatobiliary: Unenhanced liver is unremarkable. Gallbladder is unremarkable. No intrahepatic extrahepatic ductal dilatation. Pancreas: Within normal limits. Spleen: Within normal limits. Adrenals/Urinary Tract: Adrenal glands are within normal limits. Right kidney is within normal limits. Left kidney is notable for cortical scarring/ atrophy. No renal, ureteral, or bladder calculi.  No hydronephrosis. Bladder is within normal limits. Stomach/Bowel: Stomach is notable for a small hiatal hernia. No evidence of bowel obstruction. Extensive sigmoid diverticulosis, without evidence of diverticulitis. Vascular/Lymphatic: Atherosclerotic calcifications of the abdominal aorta and branch vessels. No evidence of abdominal aortic aneurysm. No suspicious abdominopelvic lymphadenopathy. Reproductive: Status post hysterectomy. Left ovary is within normal limits.  No right adnexal mass. Other: No abdominopelvic ascites. No hemoperitoneum or free air. Musculoskeletal: Visualized osseous structures are within normal limits. No fracture is seen.  Specifically, the right hip appears intact. IMPRESSION:  Nondisplaced right 3rd, 5th, 6th, and 7th rib fractures. Small bilateral pleural effusions, left greater than right. No pneumothorax. No evidence of traumatic injury to the abdomen/ pelvis. No evidence of right hip fracture. Electronically Signed   By: Charline Bills M.D.   On: 12/03/2015 12:24   Ct Cervical Spine Wo Contrast Result Date: 12/03/2015 CLINICAL DATA:  80 year old female status post fall in bathroom this morning. Initial encounter. EXAM: CT HEAD WITHOUT CONTRAST CT CERVICAL SPINE WITHOUT CONTRAST TECHNIQUE: Multidetector CT imaging of the head and cervical spine was performed following the standard protocol without intravenous contrast. Multiplanar CT image reconstructions of the cervical spine were also generated. COMPARISON:  Head CT without contrast 11/24/2009. FINDINGS: CT HEAD FINDINGS Brain: Patchy chronic white matter hypodensity, most pronounced in the left periatrial region, is mildly progressed since 2011. No midline shift, ventriculomegaly, mass effect, evidence of mass lesion, intracranial hemorrhage or evidence of cortically based acute infarction. Other gray-white matter differentiation is within normal limits throughout the brain. Vascular: Calcified atherosclerosis at the skull base. Skull: Calvarium intact. Sinuses/Orbits: Stable, chronic left maxillary sinusitis. Other Visualized paranasal sinuses and mastoids are stable and well pneumatized. Other: No acute orbit or scalp soft tissue findings identified. CT CERVICAL SPINE FINDINGS Alignment: Mild motion artifact. Cervicothoracic junction alignment is within normal limits. Bilateral posterior element alignment is within normal limits. Relatively preserved cervical lordosis. Skull base and vertebrae: Visualized skull base is intact. No atlanto-occipital dissociation. No acute cervical spine fracture identified. Soft tissues and spinal canal: Negative noncontrast neck soft tissues aside from calcified carotid atherosclerosis and a  retropharyngeal course of both carotids. Disc levels: Mild to moderate left upper cervical facet hypertrophy. Lower cervical chronic disc and endplate degeneration maximal at C5-C6. Mild if any associated cervical spinal stenosis. Upper chest: Grossly intact visualized upper thoracic levels. Negative lung apices. Other: None. IMPRESSION: 1. No acute intracranial abnormality. Progressed chronic cerebral white matter changes since 2011. 2. No acute fracture or listhesis  identified in the cervical spine. Ligamentous injury is not excluded. Electronically Signed   By: Odessa Fleming M.D.   On: 12/03/2015 11:06   Dg Hip Unilat With Pelvis 2-3 Views Right Result Date: 12/03/2015 CLINICAL DATA:  Pain following fall EXAM: DG HIP (WITH OR WITHOUT PELVIS) 2-3V RIGHT COMPARISON:  None. FINDINGS: Frontal pelvis as well as standing frontal and lateral right hip images were obtained. There is no fracture or dislocation. Joint spaces appear normal. No erosive change. IMPRESSION: No fracture or dislocation.  No evident arthropathy. Electronically Signed   By: Bretta Bang III M.D.   On: 12/03/2015 10:07   Results for RAGUEL, KOSLOSKI (MRN [id number]) as of 12/03/2015 13:49  Ref. Range 01/06/2015 05:23 04/21/2015 08:21 08/10/2015 09:03 12/03/2015 10:20  BUN Latest Ref Range: 6 - 20 mg/dL 26 (H) 25 27 (H) 31 (H)  Creatinine Latest Ref Range: 0.44 - 1.00 mg/dL 4.09 (H) 8.11 (H) 9.14 (H) 1.53 (H)    1250:  CT/XR as above. Dx and testing d/w pt and family.  Questions answered.  Verb understanding, agreeable to admit. T/C to [facility]. Kerry Hough, case discussed, including:  HPI, pertinent PM/SHx, VS/PE, dx testing, ED course and treatment:  Agreeable to admit, requests he will come to the ED for evaluation.      Final Clinical Impressions(s) / ED Diagnoses   Final diagnoses:  None    New Prescriptions New Prescriptions   No medications on file     Samuel Jester, DO 12/06/15 1710

## 2015-12-03 NOTE — H&P (Signed)
History and Physical    EVAH BUTRICK OTR:[id number] DOB: 29-Jul-1935 DOA: 12/03/2015  PCP: Lilyan Punt, MD  Patient coming from: home  Chief Complaint: fall  HPI: [name] is a 80 y.o. female with medical history significant of chronic respiratory failure on 3-4 L of oxygen, chronic diastolic heart failure, COPD . patient was in her usual state of health this morning when she went to the bathroom and suffered a fall. She is unable to leave the circumstances of the fall, but denies loss of consciousness. Family reports that they noted water on the floor the bathroom and feels that she may have slept . she complains of pain from her right shoulder down to her right hip. Prior to her fall, she did not have any dizziness, chest pain, shortness of breath. She's not been having any fever, nausea, vomiting. Bowel movements are normal. She's not had any dysuria.  ED Course: She is evaluated in the emergency room where she was noted to have multiple right-sided rib fractures. She complained of difficulty breathing, likely due to splinting. Family notes that she was unable to even turn in bed due to pain and worsening shortness of breath. She is referred for observation.  Review of Systems: As per HPI otherwise 10 point review of systems negative.    Past Medical History:  Diagnosis Date  . Acute on chronic diastolic heart failure (HCC) 04/11/2014  . Asthma   . Bradycardia   . CAD (coronary artery disease) 08/2009   s/p PCI of the LAD  . Carotid artery stenosis   . Chronic headache   . COPD (chronic obstructive pulmonary disease) (HCC)   . Dementia    patient denies this  . Hyperlipidemia   . Hypertension   . Impaired fasting glucose   . Myocardial infarction    Non-st segment elevated  . On home O2   . Osteoporosis 2009  . Pneumonia   . Seasonal allergies   . Ulnar neuropathy     Past Surgical History:  Procedure Laterality Date  . BLADDER SURGERY  1991  . CARDIAC  CATHETERIZATION  11/2010   negative  . CATARACT EXTRACTION  Nov 2016  . LEFT HEART CATHETERIZATION WITH CORONARY ANGIOGRAM N/A 11/05/2012   Procedure: LEFT HEART CATHETERIZATION WITH CORONARY ANGIOGRAM;  Surgeon: Micheline Chapman, MD;  Location: [facility] CATH LAB;  Service: Cardiovascular;  Laterality: N/A;  . PARTIAL HYSTERECTOMY  1984  . Radial artery catheter     Bladder     reports that she quit smoking about 7 years ago. She has a 35.00 pack-year smoking history. She has never used smokeless tobacco. She reports that she does not drink alcohol or use drugs.  Allergies  Allergen Reactions  . Codeine Nausea And Vomiting  . Dilaudid [Hydromorphone Hcl] Other (See Comments)    Extremely sensitive  . Erythromycin Other (See Comments)    Intense stomach pain  . Sulfonamide Derivatives     unknown    Family History  Problem Relation Age of Onset  . Addison's disease Mother   . Alcohol abuse Father     Prior to Admission medications   Medication Sig Start Date End Date Taking? Authorizing Provider  acetaminophen (TYLENOL) 325 MG tablet Take 650 mg by mouth every 6 (six) hours as needed for moderate pain.   Yes Historical Provider, MD  albuterol (PROVENTIL HFA;VENTOLIN HFA) 108 (90 Base) MCG/ACT inhaler Inhale 2 puffs into the lungs 3 (three) times daily. 09/07/15  Yes Scott A  Gerda DissLuking, MD  albuterol (PROVENTIL) (2.5 MG/3ML) 0.083% nebulizer solution Take 3 mLs (2.5 mg total) by nebulization every 4 (four) hours as needed for wheezing or shortness of breath. 10/12/15  Yes Babs SciaraScott A Luking, MD  alendronate (FOSAMAX) 70 MG tablet TAKE 1 TABLET BY MOUTH 1 TIME WEEKLY AS DIRECTED 11/06/15  Yes Babs SciaraScott A Luking, MD  amLODipine (NORVASC) 5 MG tablet TAKE 1/2 TABLET BY MOUTH DAILY 07/09/15  Yes Babs SciaraScott A Luking, MD  Ascorbic Acid (VITAMIN C PO) Take 1 tablet by mouth daily.   Yes Historical Provider, MD  aspirin 325 MG tablet Take 325 mg by mouth daily.   Yes Historical Provider, MD  budesonide-formoterol  (SYMBICORT) 160-4.5 MCG/ACT inhaler Inhale 2 puffs into the lungs 2 (two) times daily. 08/28/13  Yes Babs SciaraScott A Luking, MD  Calcium Carbonate (CALCIUM 600 PO) Take 1 tablet by mouth daily.    Yes Historical Provider, MD  cetirizine (ZYRTEC) 10 MG tablet Take 10 mg by mouth daily.   Yes Historical Provider, MD  Cholecalciferol (VITAMIN D3) 1000 UNITS CAPS Take 1 capsule by mouth daily.   Yes Historical Provider, MD  donepezil (ARICEPT) 10 MG tablet TAKE 1 TABLET BY MOUTH EVERY NIGHT AT BEDTIME 09/17/15  Yes Babs SciaraScott A Luking, MD  furosemide (LASIX) 40 MG tablet Take 2 tablets every morning by mouth. 01/06/15  Yes Estela Isaiah BlakesY Hernandez Acosta, MD  gabapentin (NEURONTIN) 100 MG capsule TAKE 1 CAPSULE(100 MG) BY MOUTH THREE TIMES DAILY 09/28/15  Yes Babs SciaraScott A Luking, MD  isosorbide mononitrate (IMDUR) 30 MG 24 hr tablet Take 0.5 tablets (15 mg total) by mouth daily. 08/10/15  Yes Rosalio MacadamiaLori C Gerhardt, NP  LORazepam (ATIVAN) 1 MG tablet TAKE 1/2 TABLET TO 1 TABLET BY MOUTH EVERY NIGHT AT BEDTIME AS NEEDED 09/30/15  Yes Merlyn AlbertWilliam S Luking, MD  losartan (COZAAR) 100 MG tablet TAKE 1 TABLET BY MOUTH EVERY DAY 09/01/15  Yes Babs SciaraScott A Luking, MD  nitroGLYCERIN (NITROSTAT) 0.4 MG SL tablet Place 1 tablet (0.4 mg total) under the tongue as needed for chest pain. 12/18/14  Yes Babs SciaraScott A Luking, MD  omeprazole (PRILOSEC) 20 MG capsule Take 20 mg by mouth daily.   Yes Historical Provider, MD  OVER THE COUNTER MEDICATION Gas relief   Yes Historical Provider, MD  polyethylene glycol (MIRALAX / GLYCOLAX) packet Take 17 g by mouth daily as needed for moderate constipation. 04/16/14  Yes Elliot Cousinenise Fisher, MD  potassium chloride (K-DUR,KLOR-CON) 10 MEQ tablet TAKE 1 TABLET(10 MEQ) BY MOUTH TWICE DAILY 12/02/15  Yes Babs SciaraScott A Luking, MD  pravastatin (PRAVACHOL) 40 MG tablet TAKE 1 TABLET BY MOUTH EVERY DAY 11/06/15  Yes Babs SciaraScott A Luking, MD  ranitidine (ZANTAC) 150 MG tablet Take 150 mg by mouth daily as needed for heartburn.   Yes Historical Provider, MD    sertraline (ZOLOFT) 100 MG tablet TAKE ONE AND ONE HALF(1 1/2) TABLET BY MOUTH DAILY 08/21/15  Yes Babs SciaraScott A Luking, MD  TRAVATAN Z 0.004 % SOLN ophthalmic solution Place 1 drop into the left eye at bedtime.  10/08/14  Yes Historical Provider, MD    Physical Exam: Vitals:   12/03/15 1430 12/03/15 1500 12/03/15 1525 12/03/15 1530  BP: 197/71 (!) 188/54 (!) 188/54 187/65  Pulse: 69 64 69 67  Resp:   20   Temp:      TempSrc:      SpO2: 95% 97% 98% 95%  Weight:      Height:          Constitutional: NAD,  calm, comfortable Vitals:   12/03/15 1430 12/03/15 1500 12/03/15 1525 12/03/15 1530  BP: 197/71 (!) 188/54 (!) 188/54 187/65  Pulse: 69 64 69 67  Resp:   20   Temp:      TempSrc:      SpO2: 95% 97% 98% 95%  Weight:      Height:       Eyes: PERRL, lids and conjunctivae normal ENMT: Mucous membranes are moist. Posterior pharynx clear of any exudate or lesions.Normal dentition.  Neck: normal, supple, no masses, no thyromegaly Respiratory: clear to auscultation bilaterally, no wheezing, no crackles. Normal respiratory effort. No accessory muscle use.  Cardiovascular: Regular rate and rhythm, no murmurs / rubs / gallops. No extremity edema. 2+ pedal pulses. No carotid bruits.  Abdomen: no tenderness, no masses palpated. No hepatosplenomegaly. Bowel sounds positive.  Musculoskeletal: no clubbing / cyanosis. No joint deformity upper and lower extremities. Good ROM, no contractures. Normal muscle tone. Tenderness to palpation over the right chest Skin: no rashes, lesions, ulcers. No induration Neurologic: CN 2-12 grossly intact. Sensation intact, DTR normal. Strength 5/5 in all 4.  Psychiatric: Normal judgment and insight. Alert and oriented x 3. Normal mood.    Labs on Admission: I have personally reviewed following labs and imaging studies  CBC:  Recent Labs Lab 12/03/15 1020  WBC 7.2  NEUTROABS 6.1  HGB 10.4*  HCT 33.4*  MCV 87.7  PLT 131*   Basic Metabolic  Panel:  Recent Labs Lab 12/03/15 1020  NA 138  K 4.2  CL 102  CO2 29  GLUCOSE 148*  BUN 31*  CREATININE 1.53*  CALCIUM 8.6*   GFR: Estimated Creatinine Clearance: 30.2 mL/min (by C-G formula based on SCr of 1.53 mg/dL (H)). Liver Function Tests: No results for input(s): AST, ALT, ALKPHOS, BILITOT, PROT, ALBUMIN in the last 168 hours. No results for input(s): LIPASE, AMYLASE in the last 168 hours. No results for input(s): AMMONIA in the last 168 hours. Coagulation Profile: No results for input(s): INR, PROTIME in the last 168 hours. Cardiac Enzymes: No results for input(s): CKTOTAL, CKMB, CKMBINDEX, TROPONINI in the last 168 hours. BNP (last 3 results) No results for input(s): PROBNP in the last 8760 hours. HbA1C: No results for input(s): HGBA1C in the last 72 hours. CBG: No results for input(s): GLUCAP in the last 168 hours. Lipid Profile: No results for input(s): CHOL, HDL, LDLCALC, TRIG, CHOLHDL, LDLDIRECT in the last 72 hours. Thyroid Function Tests: No results for input(s): TSH, T4TOTAL, FREET4, T3FREE, THYROIDAB in the last 72 hours. Anemia Panel: No results for input(s): VITAMINB12, FOLATE, FERRITIN, TIBC, IRON, RETICCTPCT in the last 72 hours. Urine analysis:    Component Value Date/Time   COLORURINE YELLOW 12/03/2015 1010   APPEARANCEUR CLEAR 12/03/2015 1010   LABSPEC 1.025 12/03/2015 1010   PHURINE 6.0 12/03/2015 1010   GLUCOSEU NEGATIVE 12/03/2015 1010   HGBUR NEGATIVE 12/03/2015 1010   BILIRUBINUR NEGATIVE 12/03/2015 1010   KETONESUR NEGATIVE 12/03/2015 1010   PROTEINUR NEGATIVE 12/03/2015 1010   UROBILINOGEN 0.2 04/13/2014 1200   NITRITE NEGATIVE 12/03/2015 1010   LEUKOCYTESUR NEGATIVE 12/03/2015 1010   Sepsis Labs: !!!!!!!!!!!!!!!!!!!!!!!!!!!!!!!!!!!!!!!!!!!! @LABRCNTIP (procalcitonin:4,lacticidven:4) )No results found for this or any previous visit (from the past 240 hour(s)).   Radiological Exams on Admission: Ct Abdomen Pelvis Wo  Contrast  Result Date: 12/03/2015 CLINICAL DATA:  Fall in the bathroom, right rib and right hip pain EXAM: CT CHEST, ABDOMEN AND PELVIS WITHOUT CONTRAST TECHNIQUE: Multidetector CT imaging of the chest, abdomen and pelvis was  performed following the standard protocol without IV contrast. COMPARISON:  None. FINDINGS: CT CHEST FINDINGS Cardiovascular: Mild cardiomegaly.  No pericardial effusion. Three vessel coronary atherosclerosis. Atherosclerotic calcifications of the aortic arch. Mediastinum/Nodes: No suspicious mediastinal lymphadenopathy. Visualized thyroid is grossly unremarkable. Lungs/Pleura: No suspicious pulmonary nodules. Mild centrilobular emphysematous changes, upper lobe predominant. Mild scarring in the medial right middle lobe. Mild patchy opacity in the posterior left lower lobe and right lung base, likely atelectasis. Small bilateral pleural effusions, left greater than right. No pneumothorax. Musculoskeletal: Nondisplaced right anterior lateral 3rd rib fracture (series 2/ image 22). Nondisplaced right lateral 5th, 6th, and 7th rib fractures (series 2/ images 33, 37, and 44). Mild superior endplate compression fracture deformity at T5, age indeterminate. No retropulsion. Moderate superior endplate compression fracture deformity at T11, age indeterminate. No retropulsion. Mild degenerative changes of the lower thoracic spine. CT ABDOMEN PELVIS FINDINGS Motion degraded images. Hepatobiliary: Unenhanced liver is unremarkable. Gallbladder is unremarkable. No intrahepatic extrahepatic ductal dilatation. Pancreas: Within normal limits. Spleen: Within normal limits. Adrenals/Urinary Tract: Adrenal glands are within normal limits. Right kidney is within normal limits. Left kidney is notable for cortical scarring/ atrophy. No renal, ureteral, or bladder calculi.  No hydronephrosis. Bladder is within normal limits. Stomach/Bowel: Stomach is notable for a small hiatal hernia. No evidence of bowel  obstruction. Extensive sigmoid diverticulosis, without evidence of diverticulitis. Vascular/Lymphatic: Atherosclerotic calcifications of the abdominal aorta and branch vessels. No evidence of abdominal aortic aneurysm. No suspicious abdominopelvic lymphadenopathy. Reproductive: Status post hysterectomy. Left ovary is within normal limits.  No right adnexal mass. Other: No abdominopelvic ascites. No hemoperitoneum or free air. Musculoskeletal: Visualized osseous structures are within normal limits. No fracture is seen.  Specifically, the right hip appears intact. IMPRESSION: Nondisplaced right 3rd, 5th, 6th, and 7th rib fractures. Small bilateral pleural effusions, left greater than right. No pneumothorax. No evidence of traumatic injury to the abdomen/ pelvis. No evidence of right hip fracture. Electronically Signed   By: Charline Bills M.D.   On: 12/03/2015 12:24   Dg Ribs Unilateral W/chest Right  Result Date: 12/03/2015 CLINICAL DATA:  Pain following fall.  Shortness of breath. EXAM: RIGHT RIBS AND CHEST - 3+ VIEW COMPARISON:  January 05, 2015 FINDINGS: Frontal chest as well as oblique and cone-down lower rib images were obtained. There is no edema or consolidation. Heart is slightly enlarged with pulmonary vascularity within normal limits. There is atherosclerotic calcification in the aorta. No adenopathy. There are displaced fractures of the anterior right fifth, sixth, and seventh ribs. No pneumothorax evident. No pleural effusion. IMPRESSION: Displaced fractures of the anterior right fifth, sixth, and seventh ribs. No pneumothorax or pleural effusion. No edema or consolidation. Stable cardiac prominence. There is aortic atherosclerosis. Electronically Signed   By: Bretta Bang III M.D.   On: 12/03/2015 10:03   Ct Head Wo Contrast  Result Date: 12/03/2015 CLINICAL DATA:  80 year old female status post fall in bathroom this morning. Initial encounter. EXAM: CT HEAD WITHOUT CONTRAST CT  CERVICAL SPINE WITHOUT CONTRAST TECHNIQUE: Multidetector CT imaging of the head and cervical spine was performed following the standard protocol without intravenous contrast. Multiplanar CT image reconstructions of the cervical spine were also generated. COMPARISON:  Head CT without contrast 11/24/2009. FINDINGS: CT HEAD FINDINGS Brain: Patchy chronic white matter hypodensity, most pronounced in the left periatrial region, is mildly progressed since 2011. No midline shift, ventriculomegaly, mass effect, evidence of mass lesion, intracranial hemorrhage or evidence of cortically based acute infarction. Other gray-white matter differentiation is within normal  limits throughout the brain. Vascular: Calcified atherosclerosis at the skull base. Skull: Calvarium intact. Sinuses/Orbits: Stable, chronic left maxillary sinusitis. Other Visualized paranasal sinuses and mastoids are stable and well pneumatized. Other: No acute orbit or scalp soft tissue findings identified. CT CERVICAL SPINE FINDINGS Alignment: Mild motion artifact. Cervicothoracic junction alignment is within normal limits. Bilateral posterior element alignment is within normal limits. Relatively preserved cervical lordosis. Skull base and vertebrae: Visualized skull base is intact. No atlanto-occipital dissociation. No acute cervical spine fracture identified. Soft tissues and spinal canal: Negative noncontrast neck soft tissues aside from calcified carotid atherosclerosis and a retropharyngeal course of both carotids. Disc levels: Mild to moderate left upper cervical facet hypertrophy. Lower cervical chronic disc and endplate degeneration maximal at C5-C6. Mild if any associated cervical spinal stenosis. Upper chest: Grossly intact visualized upper thoracic levels. Negative lung apices. Other: None. IMPRESSION: 1. No acute intracranial abnormality. Progressed chronic cerebral white matter changes since 2011. 2. No acute fracture or listhesis identified in  the cervical spine. Ligamentous injury is not excluded. Electronically Signed   By: Odessa Fleming M.D.   On: 12/03/2015 11:06   Ct Chest Wo Contrast  Result Date: 12/03/2015 CLINICAL DATA:  Fall in the bathroom, right rib and right hip pain EXAM: CT CHEST, ABDOMEN AND PELVIS WITHOUT CONTRAST TECHNIQUE: Multidetector CT imaging of the chest, abdomen and pelvis was performed following the standard protocol without IV contrast. COMPARISON:  None. FINDINGS: CT CHEST FINDINGS Cardiovascular: Mild cardiomegaly.  No pericardial effusion. Three vessel coronary atherosclerosis. Atherosclerotic calcifications of the aortic arch. Mediastinum/Nodes: No suspicious mediastinal lymphadenopathy. Visualized thyroid is grossly unremarkable. Lungs/Pleura: No suspicious pulmonary nodules. Mild centrilobular emphysematous changes, upper lobe predominant. Mild scarring in the medial right middle lobe. Mild patchy opacity in the posterior left lower lobe and right lung base, likely atelectasis. Small bilateral pleural effusions, left greater than right. No pneumothorax. Musculoskeletal: Nondisplaced right anterior lateral 3rd rib fracture (series 2/ image 22). Nondisplaced right lateral 5th, 6th, and 7th rib fractures (series 2/ images 33, 37, and 44). Mild superior endplate compression fracture deformity at T5, age indeterminate. No retropulsion. Moderate superior endplate compression fracture deformity at T11, age indeterminate. No retropulsion. Mild degenerative changes of the lower thoracic spine. CT ABDOMEN PELVIS FINDINGS Motion degraded images. Hepatobiliary: Unenhanced liver is unremarkable. Gallbladder is unremarkable. No intrahepatic extrahepatic ductal dilatation. Pancreas: Within normal limits. Spleen: Within normal limits. Adrenals/Urinary Tract: Adrenal glands are within normal limits. Right kidney is within normal limits. Left kidney is notable for cortical scarring/ atrophy. No renal, ureteral, or bladder calculi.  No  hydronephrosis. Bladder is within normal limits. Stomach/Bowel: Stomach is notable for a small hiatal hernia. No evidence of bowel obstruction. Extensive sigmoid diverticulosis, without evidence of diverticulitis. Vascular/Lymphatic: Atherosclerotic calcifications of the abdominal aorta and branch vessels. No evidence of abdominal aortic aneurysm. No suspicious abdominopelvic lymphadenopathy. Reproductive: Status post hysterectomy. Left ovary is within normal limits.  No right adnexal mass. Other: No abdominopelvic ascites. No hemoperitoneum or free air. Musculoskeletal: Visualized osseous structures are within normal limits. No fracture is seen.  Specifically, the right hip appears intact. IMPRESSION: Nondisplaced right 3rd, 5th, 6th, and 7th rib fractures. Small bilateral pleural effusions, left greater than right. No pneumothorax. No evidence of traumatic injury to the abdomen/ pelvis. No evidence of right hip fracture. Electronically Signed   By: Charline Bills M.D.   On: 12/03/2015 12:24   Ct Cervical Spine Wo Contrast  Result Date: 12/03/2015 CLINICAL DATA:  80 year old female status post fall in  bathroom this morning. Initial encounter. EXAM: CT HEAD WITHOUT CONTRAST CT CERVICAL SPINE WITHOUT CONTRAST TECHNIQUE: Multidetector CT imaging of the head and cervical spine was performed following the standard protocol without intravenous contrast. Multiplanar CT image reconstructions of the cervical spine were also generated. COMPARISON:  Head CT without contrast 11/24/2009. FINDINGS: CT HEAD FINDINGS Brain: Patchy chronic white matter hypodensity, most pronounced in the left periatrial region, is mildly progressed since 2011. No midline shift, ventriculomegaly, mass effect, evidence of mass lesion, intracranial hemorrhage or evidence of cortically based acute infarction. Other gray-white matter differentiation is within normal limits throughout the brain. Vascular: Calcified atherosclerosis at the skull  base. Skull: Calvarium intact. Sinuses/Orbits: Stable, chronic left maxillary sinusitis. Other Visualized paranasal sinuses and mastoids are stable and well pneumatized. Other: No acute orbit or scalp soft tissue findings identified. CT CERVICAL SPINE FINDINGS Alignment: Mild motion artifact. Cervicothoracic junction alignment is within normal limits. Bilateral posterior element alignment is within normal limits. Relatively preserved cervical lordosis. Skull base and vertebrae: Visualized skull base is intact. No atlanto-occipital dissociation. No acute cervical spine fracture identified. Soft tissues and spinal canal: Negative noncontrast neck soft tissues aside from calcified carotid atherosclerosis and a retropharyngeal course of both carotids. Disc levels: Mild to moderate left upper cervical facet hypertrophy. Lower cervical chronic disc and endplate degeneration maximal at C5-C6. Mild if any associated cervical spinal stenosis. Upper chest: Grossly intact visualized upper thoracic levels. Negative lung apices. Other: None. IMPRESSION: 1. No acute intracranial abnormality. Progressed chronic cerebral white matter changes since 2011. 2. No acute fracture or listhesis identified in the cervical spine. Ligamentous injury is not excluded. Electronically Signed   By: Odessa Fleming M.D.   On: 12/03/2015 11:06   Dg Hip Unilat With Pelvis 2-3 Views Right  Result Date: 12/03/2015 CLINICAL DATA:  Pain following fall EXAM: DG HIP (WITH OR WITHOUT PELVIS) 2-3V RIGHT COMPARISON:  None. FINDINGS: Frontal pelvis as well as standing frontal and lateral right hip images were obtained. There is no fracture or dislocation. Joint spaces appear normal. No erosive change. IMPRESSION: No fracture or dislocation.  No evident arthropathy. Electronically Signed   By: Bretta Bang III M.D.   On: 12/03/2015 10:07   Assessment/Plan Active Problems:   Dementia   Essential hypertension   Hyperlipidemia   CKD (chronic kidney  disease) stage 3, GFR 30-59 ml/min   Chronic diastolic congestive heart failure (HCC)   Multiple fractures of ribs, right side, initial encounter for closed fracture   Rib fractures   Chronic respiratory failure (HCC)   COPD (chronic obstructive pulmonary disease) (HCC)   1. Multiple rib fractures. Related to fall. Management will be supportive at this point. Fractures are nondisplaced on CT scan. Will provide pain management. Physical therapy consultation. Due to her baseline respiratory compromise, patient and family are very concerned about her tachypnea due to pain. This should hopefully improve as pain is better controlled.  2. Chronic diastolic congestive heart failure. Continue Lasix. Respiratory status appears to be at baseline. No evidence of pedal edema. 3. COPD . no evidence of wheezing. Continue bronchodilators. 4. Chronic respiratory failure. Patient is in her baseline oxygen requirement. We'll continue current treatments. 5. CKD stage 3. Creatinine is currently at baseline. Continue to follow. 6. Hypertension. Likely elevated due to pain. Continue home regimen for now.  7. Dementia. Continue Aricept.   DVT prophylaxis: lovenox Code Status: full code Family Communication: discussed with daughters at the bedside Disposition Plan: likely discharge home tomorrow after PT eval Consults  called:  Admission status: observation, Link Snuffer MD [facility] Pager [phone]  If 7PM-7AM, please contact night-coverage [email or web address] Password [email or web address]  12/03/2015, 4:35 PM

## 2015-12-04 ENCOUNTER — Other Ambulatory Visit: Payer: Self-pay | Admitting: Family Medicine

## 2015-12-04 DIAGNOSIS — S2241XA Multiple fractures of ribs, right side, initial encounter for closed fracture: Secondary | ICD-10-CM | POA: Diagnosis not present

## 2015-12-04 DIAGNOSIS — N183 Chronic kidney disease, stage 3 (moderate): Secondary | ICD-10-CM | POA: Diagnosis not present

## 2015-12-04 DIAGNOSIS — J9611 Chronic respiratory failure with hypoxia: Secondary | ICD-10-CM | POA: Diagnosis not present

## 2015-12-04 DIAGNOSIS — I5032 Chronic diastolic (congestive) heart failure: Secondary | ICD-10-CM | POA: Diagnosis not present

## 2015-12-04 LAB — BASIC METABOLIC PANEL
Anion gap: 7 (ref 5–15)
BUN: 25 mg/dL — AB (ref 6–20)
CALCIUM: 8.2 mg/dL — AB (ref 8.9–10.3)
CHLORIDE: 101 mmol/L (ref 101–111)
CO2: 31 mmol/L (ref 22–32)
CREATININE: 1.38 mg/dL — AB (ref 0.44–1.00)
GFR calc Af Amer: 41 mL/min — ABNORMAL LOW (ref >60)
GFR calc non Af Amer: 36 mL/min — ABNORMAL LOW (ref >60)
Glucose, Bld: 116 mg/dL — ABNORMAL HIGH (ref 65–99)
Potassium: 4 mmol/L (ref 3.5–5.1)
SODIUM: 139 mmol/L (ref 135–145)

## 2015-12-04 LAB — CBC
HEMATOCRIT: 30.5 % — AB (ref 36.0–46.0)
HEMOGLOBIN: 9.7 g/dL — AB (ref 12.0–15.0)
MCH: 28.1 pg (ref 26.0–34.0)
MCHC: 31.8 g/dL (ref 30.0–36.0)
MCV: 88.4 fL (ref 78.0–100.0)
Platelets: 122 10*3/uL — ABNORMAL LOW (ref 150–400)
RBC: 3.45 MIL/uL — ABNORMAL LOW (ref 3.87–5.11)
RDW: 14.7 % (ref 11.5–15.5)
WBC: 6 10*3/uL (ref 4.0–10.5)

## 2015-12-04 MED ORDER — INFLUENZA VAC SPLIT QUAD 0.5 ML IM SUSY
0.5000 mL | PREFILLED_SYRINGE | INTRAMUSCULAR | Status: AC
  Administered 2015-12-04: 0.5 mL via INTRAMUSCULAR
  Filled 2015-12-04: qty 0.5

## 2015-12-04 MED ORDER — HYDROCODONE-ACETAMINOPHEN 5-325 MG PO TABS: 1.0000 | ORAL_TABLET | ORAL | 0 refills | Status: DC | PRN

## 2015-12-04 NOTE — Care Management Obs Status (Signed)
MEDICARE OBSERVATION STATUS NOTIFICATION   Patient Details  Name: [name] MRN: [id number] Date of Birth: December 16, 1935   Medicare Observation Status Notification Given:  Yes    Tayleigh Wetherell, [name], RN 12/04/2015, 1:45 PM

## 2015-12-04 NOTE — Evaluation (Signed)
Physical Therapy Evaluation Patient Details Name: [name] MRN: [id number] DOB: 02/02/1936 Today's Date: 12/04/2015   History of Present Illness   80 y.o. female with medical history significant of chronic respiratory failure on 3-4 L of oxygen, chronic diastolic heart failure, COPD . patient was in her usual state of health this morning when she went to the bathroom and suffered a fall. She is unable to leave the circumstances of the fall, but denies loss of consciousness. Family reports that they noted water on the floor the bathroom and feels that she may have slept . she complains of pain from her right shoulder down to her right hip. Prior to her fall, she did not have any dizziness, chest pain, shortness of breath. She's not been having any fever, nausea, vomiting. Bowel movements are normal. She's not had any dysuria..  She is evaluated in the emergency room where she was noted to have multiple right-sided rib fractures (R 3, 5, 6, 7th ribs). She complained of difficulty breathing, likely due to splinting  Clinical Impression  Pt received in bed, 2 dtr's present, and pt is agreeable to PT evaluation.  Pt expressed being independent with ambulation and ADL's prior to admission, however she does not drive, but does get out into the community to run errands with her husband driving.  During today's PT evaluation she ambulated 53ft around the end of the bed, however due to pain and increased respiratory rate she took a seated rest break.  W/c was obtained, and she was wheeled to the stairwell where she negotiated 4 step with HR and Min guard with both dtr's present for stair training education.  One of the dtr's will be at home with them for 1 week.  Pt is recommended to d/c home with HHPT, w/c, as well as a BSC.      Follow Up Recommendations Home health PT    Equipment Recommendations  3in1 (PT);Wheelchair (measurements PT)    Recommendations for Other Services       Precautions /  Restrictions Precautions Precautions: Fall Precaution Comments: Just this one fall in the past 6 months.  Restrictions Weight Bearing Restrictions: No      Mobility  Bed Mobility Overal bed mobility: Needs Assistance Bed Mobility: Rolling;Sidelying to Sit Rolling: Supervision (to the left) Sidelying to sit: Min guard          Transfers Overall transfer level: Needs assistance Equipment used: Rolling walker (2 wheeled) Transfers: Sit to/from [employer] Sit to Stand: Min guard Stand pivot transfers: Min guard       General transfer comment: After pt ambulated to the chair, assisted pt with gown change, and then had pt transfer chair<>w/c & wheeled pt down the hallway to the stairwell.   Ambulation/Gait Ambulation/Gait assistance: Min guard Ambulation Distance (Feet): 10 Feet Assistive device: Rolling walker (2 wheeled) Gait Pattern/deviations: Step-through pattern   Gait velocity interpretation: <1.8 ft/sec, indicative of risk for recurrent falls General Gait Details: decreased cadence due to increased pain, and difficulty with breathing due to pain.   Stairs Stairs: Yes Stairs assistance: Min guard Stair Management: One rail Left;Step to pattern;Forwards Number of Stairs: 4    Wheelchair Mobility    Modified Rankin (Stroke Patients Only)       Balance Overall balance assessment: Needs assistance Sitting-balance support: Bilateral upper extremity supported;Feet supported Sitting balance-Leahy Scale: Good     Standing balance support: Bilateral upper extremity supported Standing balance-Leahy Scale: Fair  Pertinent Vitals/Pain Pain Assessment: 0-10 Pain Score: 5  Pain Location: R trunk Pain Descriptors / Indicators: Constant;Sharp Pain Intervention(s): Limited activity within patient's tolerance;Repositioned    Home Living   Living Arrangements: Spouse/significant other (husband is going  through radiation right now.  He has 8 more tx's.  [place]   Type of Home: House Home Access: Stairs to enter   [employer] CorporationEntrance Stairs-Number of Steps: 5 in the front, and 10 in the back with HR on both.  pt states she normally goes in/out the front.  Home Layout: One level;Laundry or work area in [employer]ement Home Equipment: Environmental consultantWalker - 2 wheels;Cane - single point;Other (comment) (4L of home O2 all the time. )      Prior Function     Gait / Transfers Assistance Needed: pt was independent  ADL's / Homemaking Assistance Needed: not driving, husband drives.  Pt does all household activities.  Pt was able to get out to grocery store, and running errands.          Hand Dominance   Dominant Hand: Right    Extremity/Trunk Assessment   Upper Extremity Assessment: RUE deficits/detail;Generalized weakness   RUE: Unable to fully assess due to pain       Lower Extremity Assessment: Generalized weakness         Communication   Communication: No difficulties  Cognition Arousal/Alertness: Awake/alert Behavior During Therapy: WFL for tasks assessed/performed Overall Cognitive Status: Within Functional Limits for tasks assessed                      General Comments      Exercises     Assessment/Plan    PT Assessment Patient needs continued PT services  PT Problem List Decreased strength;Decreased activity tolerance;Decreased mobility;Decreased balance;Decreased safety awareness;Decreased knowledge of precautions;Cardiopulmonary status limiting activity          PT Treatment Interventions Gait training;Stair training;DME instruction;Functional mobility training;Therapeutic activities;Therapeutic exercise;Balance training;Patient/family education    PT Goals (Current goals can be found in the Care Plan section)  Acute Rehab PT Goals Patient Stated Goal: Pt wants to go home.  PT Goal Formulation: With patient Time For Goal  Achievement: 12/11/15 Potential to Achieve Goals: Fair    Frequency     Barriers to discharge Inaccessible home environment Dtr's present, and state they will be staying at the house with them for the next week.     Co-evaluation               End of Session Equipment Utilized During Treatment: Gait belt;Oxygen Activity Tolerance: Patient limited by pain Patient left: in chair;with call bell/phone within reach;with family/visitor present Nurse Communication: Mobility status (Tamika, RN notified of pt's mobility status. )    Functional Assessment Tool Used: [employer] AM-PAC "6-clicks"  Functional Limitation: Mobility: Walking and moving around Mobility: Walking and Moving Around Current Status [phone](G8978): At least 40 percent but less than 60 percent impaired, limited or restricted Mobility: Walking and Moving Around Goal Status [phone](G8979): At least 20 percent but less than 40 percent impaired, limited or restricted    Time: [id number] PT Time Calculation (min) (ACUTE ONLY): 49 min   Charges:   PT Evaluation $PT Eval Moderate Complexity: 1 Procedure PT Treatments $Gait Training: 23-37 mins $Therapeutic Activity: 8-22 mins   PT G Codes:   PT G-Codes **NOT FOR INPATIENT CLASS** Functional Assessment Tool Used: [employer] AM-PAC "6-clicks"  Functional Limitation:  Mobility: Walking and moving around Mobility: Walking and Moving Around Current Status [phone]): At least 40 percent but less than 60 percent impaired, limited or restricted Mobility: Walking and Moving Around Goal Status [phone]): At least 20 percent but less than 40 percent impaired, limited or restricted    Beth Dawan Farney, PT, DPT X: [phone]

## 2015-12-04 NOTE — Care Management Note (Signed)
Case Management Note  Patient Details  Name: [name] MRN: [id number] Date of Birth: [birth year]  Subjective/Objective: Patient adm from home with fall and multiple rib fractures, severe pain. She lives with her husband, who is undergoing chemo. She has a cane and walker at home if needed. She has home oxygen PTA. She would benefit from home health. Offered choice of home health agencies.                    Action/Plan: [name] of [facility] notified and will obtain orders from chart. Patient and family aware [facility] has 48 hours to initiate services.    Expected Discharge Date:       12/04/2015           Expected Discharge Plan:  Home w Home Health Services  In-House Referral:  NA  Discharge planning Services  CM Consult  Post Acute Care Choice:  Home Health Choice offered to:  Patient  DME Arranged:    DME Agency:     HH Arranged:  RN, PT, Nurse's Aide HH Agency:  [facility]  Status of Service:  In process, will continue to follow  If discussed at Long Length of Stay Meetings, dates discussed:    Additional Comments:  [name], [name], RN 12/04/2015, 1:38 PM

## 2015-12-04 NOTE — Progress Notes (Signed)
Patient discharged with instructions, prescription, and care notes.  Verbalized understanding via teach back.  IV was removed and the site was WNL. Patient voiced no further complaints or concerns at the time of discharge.  Appointments scheduled per instructions.  Patient left the floor via w/c family  And staff in stable condition.  Spoke with Costella Hatcher with [facility] stated she would have the ordered equipment sent to the home.

## 2015-12-04 NOTE — Discharge Summary (Signed)
Physician Discharge Summary  [name] DTO:[id number] DOB: [birth year] DOA: 12/03/2015  PCP: Lilyan Punt, MD  Admit date: 12/03/2015 Discharge date: 12/04/2015  Admitted From: home Disposition: home  Recommendations for Outpatient Follow-up:  1. Follow up with PCP in 1-2 weeks   Home Health: RN, PT, aide Equipment/Devices: bedside commode, wheelchair  Discharge Condition:stable CODE STATUS: full code Diet recommendation: Heart Healthy   Brief/Interim Summary: [name] is a 80 y.o. female with medical history significant of chronic respiratory failure on 3-4 L of oxygen, chronic diastolic heart failure, COPD . patient was in her usual state of health this morning when she went to the bathroom and suffered a fall. She is unable to leave the circumstances of the fall, but denies loss of consciousness. Family reports that they noted water on the floor the bathroom and feels that she may have slept . she complains of pain from her right shoulder down to her right hip. Prior to her fall, she did not have any dizziness, chest pain, shortness of breath. She's not been having any fever, nausea, vomiting. Bowel movements are normal. She's not had any dysuria.  Patient was monitored in the hospital overnight. She reported improvement of her pain with hydrocodone. She was seen by physical therapy and felt that she would benefit from a wheelchair and bedside commode. She has been set up with home health. Remainder of her chronic medical issues have remained stable. She is otherwise stable for discharge  Discharge Diagnoses:  Active Problems:   Dementia   Essential hypertension   Hyperlipidemia   CKD (chronic kidney disease) stage 3, GFR 30-59 ml/min   Chronic diastolic congestive heart failure (HCC)   Multiple fractures of ribs, right side, initial encounter for closed fracture   Rib fractures   Chronic respiratory failure (HCC)   COPD (chronic obstructive pulmonary disease)  (HCC)    Discharge Instructions  Discharge Instructions    Diet - low sodium heart healthy    Complete by:  As directed    Increase activity slowly    Complete by:  As directed        Medication List    TAKE these medications   acetaminophen 325 MG tablet Commonly known as:  TYLENOL Take 650 mg by mouth every 6 (six) hours as needed for moderate pain.   albuterol 108 (90 Base) MCG/ACT inhaler Commonly known as:  PROVENTIL HFA;VENTOLIN HFA Inhale 2 puffs into the lungs 3 (three) times daily.   albuterol (2.5 MG/3ML) 0.083% nebulizer solution Commonly known as:  PROVENTIL Take 3 mLs (2.5 mg total) by nebulization every 4 (four) hours as needed for wheezing or shortness of breath.   alendronate 70 MG tablet Commonly known as:  FOSAMAX TAKE 1 TABLET BY MOUTH 1 TIME WEEKLY AS DIRECTED   amLODipine 5 MG tablet Commonly known as:  NORVASC TAKE 1/2 TABLET BY MOUTH DAILY   aspirin 325 MG tablet Take 325 mg by mouth daily.   budesonide-formoterol 160-4.5 MCG/ACT inhaler Commonly known as:  SYMBICORT Inhale 2 puffs into the lungs 2 (two) times daily.   CALCIUM 600 PO Take 1 tablet by mouth daily.   cetirizine 10 MG tablet Commonly known as:  ZYRTEC Take 10 mg by mouth daily.   donepezil 10 MG tablet Commonly known as:  ARICEPT TAKE 1 TABLET BY MOUTH EVERY NIGHT AT BEDTIME   furosemide 40 MG tablet Commonly known as:  LASIX Take 2 tablets every morning by mouth.   gabapentin 100 MG capsule  Commonly known as:  NEURONTIN TAKE 1 CAPSULE(100 MG) BY MOUTH THREE TIMES DAILY   HYDROcodone-acetaminophen 5-325 MG tablet Commonly known as:  NORCO/VICODIN Take 1-2 tablets by mouth every 4 (four) hours as needed for moderate pain.   isosorbide mononitrate 30 MG 24 hr tablet Commonly known as:  IMDUR Take 0.5 tablets (15 mg total) by mouth daily.   LORazepam 1 MG tablet Commonly known as:  ATIVAN TAKE 1/2 TABLET TO 1 TABLET BY MOUTH EVERY NIGHT AT BEDTIME AS NEEDED    losartan 100 MG tablet Commonly known as:  COZAAR TAKE 1 TABLET BY MOUTH EVERY DAY   nitroGLYCERIN 0.4 MG SL tablet Commonly known as:  NITROSTAT Place 1 tablet (0.4 mg total) under the tongue as needed for chest pain.   omeprazole 20 MG capsule Commonly known as:  PRILOSEC Take 20 mg by mouth daily.   OVER THE COUNTER MEDICATION Gas relief   polyethylene glycol packet Commonly known as:  MIRALAX / GLYCOLAX Take 17 g by mouth daily as needed for moderate constipation.   potassium chloride 10 MEQ tablet Commonly known as:  K-DUR,KLOR-CON TAKE 1 TABLET(10 MEQ) BY MOUTH TWICE DAILY   pravastatin 40 MG tablet Commonly known as:  PRAVACHOL TAKE 1 TABLET BY MOUTH EVERY DAY   ranitidine 150 MG tablet Commonly known as:  ZANTAC Take 150 mg by mouth daily as needed for heartburn.   sertraline 100 MG tablet Commonly known as:  ZOLOFT TAKE ONE AND ONE HALF(1 1/2) TABLET BY MOUTH DAILY   TRAVATAN Z 0.004 % Soln ophthalmic solution Generic drug:  Travoprost (BAK Free) Place 1 drop into the left eye at bedtime.   VITAMIN C PO Take 1 tablet by mouth daily.   Vitamin D3 1000 units Caps Take 1 capsule by mouth daily.       Allergies  Allergen Reactions  . Codeine Nausea And Vomiting  . Dilaudid [Hydromorphone Hcl] Other (See Comments)    Extremely sensitive  . Erythromycin Other (See Comments)    Intense stomach pain  . Sulfonamide Derivatives     unknown    Consultations:     Procedures/Studies: Ct Abdomen Pelvis Wo Contrast  Result Date: 12/03/2015 CLINICAL DATA:  Fall in the bathroom, right rib and right hip pain EXAM: CT CHEST, ABDOMEN AND PELVIS WITHOUT CONTRAST TECHNIQUE: Multidetector CT imaging of the chest, abdomen and pelvis was performed following the standard protocol without IV contrast. COMPARISON:  None. FINDINGS: CT CHEST FINDINGS Cardiovascular: Mild cardiomegaly.  No pericardial effusion. Three vessel coronary atherosclerosis. Atherosclerotic  calcifications of the aortic arch. Mediastinum/Nodes: No suspicious mediastinal lymphadenopathy. Visualized thyroid is grossly unremarkable. Lungs/Pleura: No suspicious pulmonary nodules. Mild centrilobular emphysematous changes, upper lobe predominant. Mild scarring in the medial right middle lobe. Mild patchy opacity in the posterior left lower lobe and right lung base, likely atelectasis. Small bilateral pleural effusions, left greater than right. No pneumothorax. Musculoskeletal: Nondisplaced right anterior lateral 3rd rib fracture (series 2/ image 22). Nondisplaced right lateral 5th, 6th, and 7th rib fractures (series 2/ images 33, 37, and 44). Mild superior endplate compression fracture deformity at T5, age indeterminate. No retropulsion. Moderate superior endplate compression fracture deformity at T11, age indeterminate. No retropulsion. Mild degenerative changes of the lower thoracic spine. CT ABDOMEN PELVIS FINDINGS Motion degraded images. Hepatobiliary: Unenhanced liver is unremarkable. Gallbladder is unremarkable. No intrahepatic extrahepatic ductal dilatation. Pancreas: Within normal limits. Spleen: Within normal limits. Adrenals/Urinary Tract: Adrenal glands are within normal limits. Right kidney is within normal limits. Left kidney  is notable for cortical scarring/ atrophy. No renal, ureteral, or bladder calculi.  No hydronephrosis. Bladder is within normal limits. Stomach/Bowel: Stomach is notable for a small hiatal hernia. No evidence of bowel obstruction. Extensive sigmoid diverticulosis, without evidence of diverticulitis. Vascular/Lymphatic: Atherosclerotic calcifications of the abdominal aorta and branch vessels. No evidence of abdominal aortic aneurysm. No suspicious abdominopelvic lymphadenopathy. Reproductive: Status post hysterectomy. Left ovary is within normal limits.  No right adnexal mass. Other: No abdominopelvic ascites. No hemoperitoneum or free air. Musculoskeletal: Visualized osseous  structures are within normal limits. No fracture is seen.  Specifically, the right hip appears intact. IMPRESSION: Nondisplaced right 3rd, 5th, 6th, and 7th rib fractures. Small bilateral pleural effusions, left greater than right. No pneumothorax. No evidence of traumatic injury to the abdomen/ pelvis. No evidence of right hip fracture. Electronically Signed   By: Charline Bills M.D.   On: 12/03/2015 12:24   Dg Ribs Unilateral W/chest Right  Result Date: 12/03/2015 CLINICAL DATA:  Pain following fall.  Shortness of breath. EXAM: RIGHT RIBS AND CHEST - 3+ VIEW COMPARISON:  January 05, 2015 FINDINGS: Frontal chest as well as oblique and cone-down lower rib images were obtained. There is no edema or consolidation. Heart is slightly enlarged with pulmonary vascularity within normal limits. There is atherosclerotic calcification in the aorta. No adenopathy. There are displaced fractures of the anterior right fifth, sixth, and seventh ribs. No pneumothorax evident. No pleural effusion. IMPRESSION: Displaced fractures of the anterior right fifth, sixth, and seventh ribs. No pneumothorax or pleural effusion. No edema or consolidation. Stable cardiac prominence. There is aortic atherosclerosis. Electronically Signed   By: Bretta Bang III M.D.   On: 12/03/2015 10:03   Ct Head Wo Contrast  Result Date: 12/03/2015 CLINICAL DATA:  80 year old female status post fall in bathroom this morning. Initial encounter. EXAM: CT HEAD WITHOUT CONTRAST CT CERVICAL SPINE WITHOUT CONTRAST TECHNIQUE: Multidetector CT imaging of the head and cervical spine was performed following the standard protocol without intravenous contrast. Multiplanar CT image reconstructions of the cervical spine were also generated. COMPARISON:  Head CT without contrast 11/24/2009. FINDINGS: CT HEAD FINDINGS Brain: Patchy chronic white matter hypodensity, most pronounced in the left periatrial region, is mildly progressed since 2011. No midline  shift, ventriculomegaly, mass effect, evidence of mass lesion, intracranial hemorrhage or evidence of cortically based acute infarction. Other gray-white matter differentiation is within normal limits throughout the brain. Vascular: Calcified atherosclerosis at the skull base. Skull: Calvarium intact. Sinuses/Orbits: Stable, chronic left maxillary sinusitis. Other Visualized paranasal sinuses and mastoids are stable and well pneumatized. Other: No acute orbit or scalp soft tissue findings identified. CT CERVICAL SPINE FINDINGS Alignment: Mild motion artifact. Cervicothoracic junction alignment is within normal limits. Bilateral posterior element alignment is within normal limits. Relatively preserved cervical lordosis. Skull base and vertebrae: Visualized skull base is intact. No atlanto-occipital dissociation. No acute cervical spine fracture identified. Soft tissues and spinal canal: Negative noncontrast neck soft tissues aside from calcified carotid atherosclerosis and a retropharyngeal course of both carotids. Disc levels: Mild to moderate left upper cervical facet hypertrophy. Lower cervical chronic disc and endplate degeneration maximal at C5-C6. Mild if any associated cervical spinal stenosis. Upper chest: Grossly intact visualized upper thoracic levels. Negative lung apices. Other: None. IMPRESSION: 1. No acute intracranial abnormality. Progressed chronic cerebral white matter changes since 2011. 2. No acute fracture or listhesis identified in the cervical spine. Ligamentous injury is not excluded. Electronically Signed   By: Odessa Fleming M.D.   On: 12/03/2015  11:06   Ct Chest Wo Contrast  Result Date: 12/03/2015 CLINICAL DATA:  Fall in the bathroom, right rib and right hip pain EXAM: CT CHEST, ABDOMEN AND PELVIS WITHOUT CONTRAST TECHNIQUE: Multidetector CT imaging of the chest, abdomen and pelvis was performed following the standard protocol without IV contrast. COMPARISON:  None. FINDINGS: CT CHEST  FINDINGS Cardiovascular: Mild cardiomegaly.  No pericardial effusion. Three vessel coronary atherosclerosis. Atherosclerotic calcifications of the aortic arch. Mediastinum/Nodes: No suspicious mediastinal lymphadenopathy. Visualized thyroid is grossly unremarkable. Lungs/Pleura: No suspicious pulmonary nodules. Mild centrilobular emphysematous changes, upper lobe predominant. Mild scarring in the medial right middle lobe. Mild patchy opacity in the posterior left lower lobe and right lung base, likely atelectasis. Small bilateral pleural effusions, left greater than right. No pneumothorax. Musculoskeletal: Nondisplaced right anterior lateral 3rd rib fracture (series 2/ image 22). Nondisplaced right lateral 5th, 6th, and 7th rib fractures (series 2/ images 33, 37, and 44). Mild superior endplate compression fracture deformity at T5, age indeterminate. No retropulsion. Moderate superior endplate compression fracture deformity at T11, age indeterminate. No retropulsion. Mild degenerative changes of the lower thoracic spine. CT ABDOMEN PELVIS FINDINGS Motion degraded images. Hepatobiliary: Unenhanced liver is unremarkable. Gallbladder is unremarkable. No intrahepatic extrahepatic ductal dilatation. Pancreas: Within normal limits. Spleen: Within normal limits. Adrenals/Urinary Tract: Adrenal glands are within normal limits. Right kidney is within normal limits. Left kidney is notable for cortical scarring/ atrophy. No renal, ureteral, or bladder calculi.  No hydronephrosis. Bladder is within normal limits. Stomach/Bowel: Stomach is notable for a small hiatal hernia. No evidence of bowel obstruction. Extensive sigmoid diverticulosis, without evidence of diverticulitis. Vascular/Lymphatic: Atherosclerotic calcifications of the abdominal aorta and branch vessels. No evidence of abdominal aortic aneurysm. No suspicious abdominopelvic lymphadenopathy. Reproductive: Status post hysterectomy. Left ovary is within normal limits.   No right adnexal mass. Other: No abdominopelvic ascites. No hemoperitoneum or free air. Musculoskeletal: Visualized osseous structures are within normal limits. No fracture is seen.  Specifically, the right hip appears intact. IMPRESSION: Nondisplaced right 3rd, 5th, 6th, and 7th rib fractures. Small bilateral pleural effusions, left greater than right. No pneumothorax. No evidence of traumatic injury to the abdomen/ pelvis. No evidence of right hip fracture. Electronically Signed   By: Charline Bills M.D.   On: 12/03/2015 12:24   Ct Cervical Spine Wo Contrast  Result Date: 12/03/2015 CLINICAL DATA:  80 year old female status post fall in bathroom this morning. Initial encounter. EXAM: CT HEAD WITHOUT CONTRAST CT CERVICAL SPINE WITHOUT CONTRAST TECHNIQUE: Multidetector CT imaging of the head and cervical spine was performed following the standard protocol without intravenous contrast. Multiplanar CT image reconstructions of the cervical spine were also generated. COMPARISON:  Head CT without contrast 11/24/2009. FINDINGS: CT HEAD FINDINGS Brain: Patchy chronic white matter hypodensity, most pronounced in the left periatrial region, is mildly progressed since 2011. No midline shift, ventriculomegaly, mass effect, evidence of mass lesion, intracranial hemorrhage or evidence of cortically based acute infarction. Other gray-white matter differentiation is within normal limits throughout the brain. Vascular: Calcified atherosclerosis at the skull base. Skull: Calvarium intact. Sinuses/Orbits: Stable, chronic left maxillary sinusitis. Other Visualized paranasal sinuses and mastoids are stable and well pneumatized. Other: No acute orbit or scalp soft tissue findings identified. CT CERVICAL SPINE FINDINGS Alignment: Mild motion artifact. Cervicothoracic junction alignment is within normal limits. Bilateral posterior element alignment is within normal limits. Relatively preserved cervical lordosis. Skull base and  vertebrae: Visualized skull base is intact. No atlanto-occipital dissociation. No acute cervical spine fracture identified. Soft tissues and spinal  canal: Negative noncontrast neck soft tissues aside from calcified carotid atherosclerosis and a retropharyngeal course of both carotids. Disc levels: Mild to moderate left upper cervical facet hypertrophy. Lower cervical chronic disc and endplate degeneration maximal at C5-C6. Mild if any associated cervical spinal stenosis. Upper chest: Grossly intact visualized upper thoracic levels. Negative lung apices. Other: None. IMPRESSION: 1. No acute intracranial abnormality. Progressed chronic cerebral white matter changes since 2011. 2. No acute fracture or listhesis identified in the cervical spine. Ligamentous injury is not excluded. Electronically Signed   By: Odessa Fleming M.D.   On: 12/03/2015 11:06   Dg Hip Unilat With Pelvis 2-3 Views Right  Result Date: 12/03/2015 CLINICAL DATA:  Pain following fall EXAM: DG HIP (WITH OR WITHOUT PELVIS) 2-3V RIGHT COMPARISON:  None. FINDINGS: Frontal pelvis as well as standing frontal and lateral right hip images were obtained. There is no fracture or dislocation. Joint spaces appear normal. No erosive change. IMPRESSION: No fracture or dislocation.  No evident arthropathy. Electronically Signed   By: Bretta Bang III M.D.   On: 12/03/2015 10:07       Subjective: Pain is better with pain medications. No shortness of breath while resting. Becomes short of breath with exertion  Discharge Exam: Vitals:   12/04/15 0550 12/04/15 1350  BP: (!) 157/58 (!) 186/69  Pulse: 61 70  Resp: 18 17  Temp: 97.9 F (36.6 C) 98.6 F (37 C)   Vitals:   12/03/15 2346 12/04/15 0550 12/04/15 0729 12/04/15 1350  BP: (!) 147/53 (!) 157/58  (!) 186/69  Pulse: (!) 57 61  70  Resp:  18  17  Temp:  97.9 F (36.6 C)  98.6 F (37 C)  TempSrc:  Oral  Oral  SpO2:  97% 97% 92%  Weight:      Height:        General: Pt is alert,  awake, not in acute distress Cardiovascular: RRR, S1/S2 +, no rubs, no gallops Respiratory: CTA bilaterally, no wheezing, no rhonchi Abdominal: Soft, NT, ND, bowel sounds + Extremities: no edema, no cyanosis    The results of significant diagnostics from this hospitalization (including imaging, microbiology, ancillary and laboratory) are listed below for reference.     Microbiology: No results found for this or any previous visit (from the past 240 hour(s)).   Labs: BNP (last 3 results)  Recent Labs  01/05/15 1807  BNP 348.0*   Basic Metabolic Panel:  Recent Labs Lab 12/03/15 1020 12/04/15 0616  NA 138 139  K 4.2 4.0  CL 102 101  CO2 29 31  GLUCOSE 148* 116*  BUN 31* 25*  CREATININE 1.53* 1.38*  CALCIUM 8.6* 8.2*   Liver Function Tests: No results for input(s): AST, ALT, ALKPHOS, BILITOT, PROT, ALBUMIN in the last 168 hours. No results for input(s): LIPASE, AMYLASE in the last 168 hours. No results for input(s): AMMONIA in the last 168 hours. CBC:  Recent Labs Lab 12/03/15 1020 12/04/15 0616  WBC 7.2 6.0  NEUTROABS 6.1  --   HGB 10.4* 9.7*  HCT 33.4* 30.5*  MCV 87.7 88.4  PLT 131* 122*   Cardiac Enzymes: No results for input(s): CKTOTAL, CKMB, CKMBINDEX, TROPONINI in the last 168 hours. BNP: Invalid input(s): POCBNP CBG: No results for input(s): GLUCAP in the last 168 hours. D-Dimer No results for input(s): DDIMER in the last 72 hours. Hgb A1c No results for input(s): HGBA1C in the last 72 hours. Lipid Profile No results for input(s): CHOL, HDL, LDLCALC, TRIG, CHOLHDL,  LDLDIRECT in the last 72 hours. Thyroid function studies No results for input(s): TSH, T4TOTAL, T3FREE, THYROIDAB in the last 72 hours.  Invalid input(s): FREET3 Anemia work up No results for input(s): VITAMINB12, FOLATE, FERRITIN, TIBC, IRON, RETICCTPCT in the last 72 hours. Urinalysis    Component Value Date/Time   COLORURINE YELLOW 12/03/2015 1010   APPEARANCEUR CLEAR  12/03/2015 1010   LABSPEC 1.025 12/03/2015 1010   PHURINE 6.0 12/03/2015 1010   GLUCOSEU NEGATIVE 12/03/2015 1010   HGBUR NEGATIVE 12/03/2015 1010   BILIRUBINUR NEGATIVE 12/03/2015 1010   KETONESUR NEGATIVE 12/03/2015 1010   PROTEINUR NEGATIVE 12/03/2015 1010   UROBILINOGEN 0.2 04/13/2014 1200   NITRITE NEGATIVE 12/03/2015 1010   LEUKOCYTESUR NEGATIVE 12/03/2015 1010   Sepsis Labs Invalid input(s): PROCALCITONIN,  WBC,  LACTICIDVEN Microbiology No results found for this or any previous visit (from the past 240 hour(s)).   Time coordinating discharge: Over 30 minutes  SIGNED:   Erick BlinksMEMON,Aeryn Medici, MD  [facility] 12/04/2015, 2:14 PM Pager   If 7PM-7AM, please contact night-coverage [email or web address] Password [email or web address]

## 2015-12-04 NOTE — Telephone Encounter (Signed)
Form was completed please forward

## 2015-12-04 NOTE — Telephone Encounter (Signed)
This was faxed successfully.

## 2015-12-06 DIAGNOSIS — I5032 Chronic diastolic (congestive) heart failure: Secondary | ICD-10-CM | POA: Diagnosis not present

## 2015-12-06 DIAGNOSIS — N183 Chronic kidney disease, stage 3 (moderate): Secondary | ICD-10-CM | POA: Diagnosis not present

## 2015-12-06 DIAGNOSIS — S2241XD Multiple fractures of ribs, right side, subsequent encounter for fracture with routine healing: Secondary | ICD-10-CM | POA: Diagnosis not present

## 2015-12-06 DIAGNOSIS — G309 Alzheimer's disease, unspecified: Secondary | ICD-10-CM | POA: Diagnosis not present

## 2015-12-06 DIAGNOSIS — Z9981 Dependence on supplemental oxygen: Secondary | ICD-10-CM | POA: Diagnosis not present

## 2015-12-06 DIAGNOSIS — J449 Chronic obstructive pulmonary disease, unspecified: Secondary | ICD-10-CM | POA: Diagnosis not present

## 2015-12-06 DIAGNOSIS — J9610 Chronic respiratory failure, unspecified whether with hypoxia or hypercapnia: Secondary | ICD-10-CM | POA: Diagnosis not present

## 2015-12-06 DIAGNOSIS — I130 Hypertensive heart and chronic kidney disease with heart failure and stage 1 through stage 4 chronic kidney disease, or unspecified chronic kidney disease: Secondary | ICD-10-CM | POA: Diagnosis not present

## 2015-12-06 DIAGNOSIS — F0280 Dementia in other diseases classified elsewhere without behavioral disturbance: Secondary | ICD-10-CM | POA: Diagnosis not present

## 2015-12-06 LAB — URINE CULTURE

## 2015-12-07 ENCOUNTER — Ambulatory Visit: Payer: Medicare Other | Admitting: Family Medicine

## 2015-12-07 ENCOUNTER — Other Ambulatory Visit: Payer: Self-pay

## 2015-12-07 MED ORDER — AMOXICILLIN 500 MG PO CAPS: 500.0000 mg | ORAL_CAPSULE | Freq: Three times a day (TID) | ORAL | 0 refills | Status: DC

## 2015-12-08 ENCOUNTER — Ambulatory Visit (INDEPENDENT_AMBULATORY_CARE_PROVIDER_SITE_OTHER): Payer: Medicare Other | Admitting: Family Medicine

## 2015-12-08 ENCOUNTER — Encounter: Payer: Self-pay | Admitting: Family Medicine

## 2015-12-08 VITALS — BP 132/70 | Ht 63.0 in | Wt 179.5 lb

## 2015-12-08 DIAGNOSIS — D692 Other nonthrombocytopenic purpura: Secondary | ICD-10-CM | POA: Diagnosis not present

## 2015-12-08 DIAGNOSIS — S2241XA Multiple fractures of ribs, right side, initial encounter for closed fracture: Secondary | ICD-10-CM

## 2015-12-08 DIAGNOSIS — N3000 Acute cystitis without hematuria: Secondary | ICD-10-CM | POA: Diagnosis not present

## 2015-12-08 DIAGNOSIS — N183 Chronic kidney disease, stage 3 (moderate): Secondary | ICD-10-CM | POA: Diagnosis not present

## 2015-12-08 DIAGNOSIS — I10 Essential (primary) hypertension: Secondary | ICD-10-CM | POA: Diagnosis not present

## 2015-12-08 DIAGNOSIS — J449 Chronic obstructive pulmonary disease, unspecified: Secondary | ICD-10-CM | POA: Diagnosis not present

## 2015-12-08 DIAGNOSIS — J9610 Chronic respiratory failure, unspecified whether with hypoxia or hypercapnia: Secondary | ICD-10-CM | POA: Diagnosis not present

## 2015-12-08 DIAGNOSIS — I130 Hypertensive heart and chronic kidney disease with heart failure and stage 1 through stage 4 chronic kidney disease, or unspecified chronic kidney disease: Secondary | ICD-10-CM | POA: Diagnosis not present

## 2015-12-08 DIAGNOSIS — I5032 Chronic diastolic (congestive) heart failure: Secondary | ICD-10-CM | POA: Diagnosis not present

## 2015-12-08 DIAGNOSIS — S2241XD Multiple fractures of ribs, right side, subsequent encounter for fracture with routine healing: Secondary | ICD-10-CM | POA: Diagnosis not present

## 2015-12-08 MED ORDER — ONDANSETRON HCL 8 MG PO TABS: 8.0000 mg | ORAL_TABLET | Freq: Three times a day (TID) | ORAL | 6 refills | Status: DC | PRN

## 2015-12-08 NOTE — Progress Notes (Signed)
   Subjective:    Patient ID: [name], female    DOB: February 27, 1935, 80 y.o.   MRN: [id number]  HPI Patient is here today for a hospital follow up visit. Patient was treated at [facility] on 12/03/15 for a fall and multiple rib fractures. Patient is still having significant pain on her right side. Patient is having some labored breathing but she believes it is due to the pain she is experiencing from the broken ribs.  The patient thinks she just lost her balance fell and broke her ribs she denies loss of consciousness. Denied hit her head. Having a lot of pain and discomfort when she moves about has a hard time taking a deep breath. Gets winded easily. Not moving is much. Denies any swelling in the legs. No leg pain. No bleeding issues. No hemoptysis. Review of Systems     see above chest pain musculoskeletal pain with deep breath patient denies vomiting fever chills Objective:   Physical Exam  Lungs are clear no crackles heart is regular pulse normal blood pressure good extremities no edema skin warm dry  Senile purpura noted on the arms    Assessment & Plan:  Transitional care  Physical therapy is out to work with the patient continue this  Stop gabapentin-this will lessen the risk of drowsiness during the night hopefully reduce risk of falls. We will also reduce lorazepam to reduce her risk of falls.  Reduce lorazepam to 0.5 nightly we will see how the sleep is doing on follow-up  COPD stable O2 sats ration looks good  Rib fractures pain medication when necessary no more than 3 in a day follow-up in approximate 3-4 weeks

## 2015-12-09 DIAGNOSIS — I130 Hypertensive heart and chronic kidney disease with heart failure and stage 1 through stage 4 chronic kidney disease, or unspecified chronic kidney disease: Secondary | ICD-10-CM | POA: Diagnosis not present

## 2015-12-09 DIAGNOSIS — J449 Chronic obstructive pulmonary disease, unspecified: Secondary | ICD-10-CM | POA: Diagnosis not present

## 2015-12-09 DIAGNOSIS — N183 Chronic kidney disease, stage 3 (moderate): Secondary | ICD-10-CM | POA: Diagnosis not present

## 2015-12-09 DIAGNOSIS — J9610 Chronic respiratory failure, unspecified whether with hypoxia or hypercapnia: Secondary | ICD-10-CM | POA: Diagnosis not present

## 2015-12-09 DIAGNOSIS — S2241XD Multiple fractures of ribs, right side, subsequent encounter for fracture with routine healing: Secondary | ICD-10-CM | POA: Diagnosis not present

## 2015-12-09 DIAGNOSIS — I5032 Chronic diastolic (congestive) heart failure: Secondary | ICD-10-CM | POA: Diagnosis not present

## 2015-12-11 DIAGNOSIS — I130 Hypertensive heart and chronic kidney disease with heart failure and stage 1 through stage 4 chronic kidney disease, or unspecified chronic kidney disease: Secondary | ICD-10-CM | POA: Diagnosis not present

## 2015-12-11 DIAGNOSIS — N183 Chronic kidney disease, stage 3 (moderate): Secondary | ICD-10-CM | POA: Diagnosis not present

## 2015-12-11 DIAGNOSIS — I5032 Chronic diastolic (congestive) heart failure: Secondary | ICD-10-CM | POA: Diagnosis not present

## 2015-12-11 DIAGNOSIS — S2241XD Multiple fractures of ribs, right side, subsequent encounter for fracture with routine healing: Secondary | ICD-10-CM | POA: Diagnosis not present

## 2015-12-11 DIAGNOSIS — J449 Chronic obstructive pulmonary disease, unspecified: Secondary | ICD-10-CM | POA: Diagnosis not present

## 2015-12-11 DIAGNOSIS — J9610 Chronic respiratory failure, unspecified whether with hypoxia or hypercapnia: Secondary | ICD-10-CM | POA: Diagnosis not present

## 2015-12-14 ENCOUNTER — Ambulatory Visit: Payer: Medicare Other | Admitting: Family Medicine

## 2015-12-14 DIAGNOSIS — J9610 Chronic respiratory failure, unspecified whether with hypoxia or hypercapnia: Secondary | ICD-10-CM | POA: Diagnosis not present

## 2015-12-14 DIAGNOSIS — S2241XD Multiple fractures of ribs, right side, subsequent encounter for fracture with routine healing: Secondary | ICD-10-CM | POA: Diagnosis not present

## 2015-12-14 DIAGNOSIS — I130 Hypertensive heart and chronic kidney disease with heart failure and stage 1 through stage 4 chronic kidney disease, or unspecified chronic kidney disease: Secondary | ICD-10-CM | POA: Diagnosis not present

## 2015-12-14 DIAGNOSIS — I5032 Chronic diastolic (congestive) heart failure: Secondary | ICD-10-CM | POA: Diagnosis not present

## 2015-12-14 DIAGNOSIS — N183 Chronic kidney disease, stage 3 (moderate): Secondary | ICD-10-CM | POA: Diagnosis not present

## 2015-12-14 DIAGNOSIS — J449 Chronic obstructive pulmonary disease, unspecified: Secondary | ICD-10-CM | POA: Diagnosis not present

## 2015-12-15 DIAGNOSIS — I5032 Chronic diastolic (congestive) heart failure: Secondary | ICD-10-CM | POA: Diagnosis not present

## 2015-12-15 DIAGNOSIS — I130 Hypertensive heart and chronic kidney disease with heart failure and stage 1 through stage 4 chronic kidney disease, or unspecified chronic kidney disease: Secondary | ICD-10-CM | POA: Diagnosis not present

## 2015-12-15 DIAGNOSIS — J449 Chronic obstructive pulmonary disease, unspecified: Secondary | ICD-10-CM | POA: Diagnosis not present

## 2015-12-15 DIAGNOSIS — N183 Chronic kidney disease, stage 3 (moderate): Secondary | ICD-10-CM | POA: Diagnosis not present

## 2015-12-15 DIAGNOSIS — J9610 Chronic respiratory failure, unspecified whether with hypoxia or hypercapnia: Secondary | ICD-10-CM | POA: Diagnosis not present

## 2015-12-15 DIAGNOSIS — S2241XD Multiple fractures of ribs, right side, subsequent encounter for fracture with routine healing: Secondary | ICD-10-CM | POA: Diagnosis not present

## 2015-12-16 ENCOUNTER — Encounter (HOSPITAL_COMMUNITY): Payer: Self-pay

## 2015-12-16 ENCOUNTER — Emergency Department (HOSPITAL_COMMUNITY): Payer: Medicare Other

## 2015-12-16 ENCOUNTER — Emergency Department (HOSPITAL_COMMUNITY)
Admission: EM | Admit: 2015-12-16 | Discharge: 2015-12-16 | Disposition: A | Discharge status: Home / Self Care | Payer: Medicare Other | Attending: Emergency Medicine | Admitting: Emergency Medicine

## 2015-12-16 DIAGNOSIS — I252 Old myocardial infarction: Secondary | ICD-10-CM | POA: Insufficient documentation

## 2015-12-16 DIAGNOSIS — Y929 Unspecified place or not applicable: Secondary | ICD-10-CM | POA: Insufficient documentation

## 2015-12-16 DIAGNOSIS — N183 Chronic kidney disease, stage 3 (moderate): Secondary | ICD-10-CM | POA: Insufficient documentation

## 2015-12-16 DIAGNOSIS — Z87891 Personal history of nicotine dependence: Secondary | ICD-10-CM | POA: Diagnosis not present

## 2015-12-16 DIAGNOSIS — J9610 Chronic respiratory failure, unspecified whether with hypoxia or hypercapnia: Secondary | ICD-10-CM | POA: Diagnosis not present

## 2015-12-16 DIAGNOSIS — M25551 Pain in right hip: Secondary | ICD-10-CM | POA: Diagnosis not present

## 2015-12-16 DIAGNOSIS — I2510 Atherosclerotic heart disease of native coronary artery without angina pectoris: Secondary | ICD-10-CM | POA: Diagnosis not present

## 2015-12-16 DIAGNOSIS — Z7982 Long term (current) use of aspirin: Secondary | ICD-10-CM | POA: Insufficient documentation

## 2015-12-16 DIAGNOSIS — I5033 Acute on chronic diastolic (congestive) heart failure: Secondary | ICD-10-CM | POA: Diagnosis not present

## 2015-12-16 DIAGNOSIS — S7001XA Contusion of right hip, initial encounter: Secondary | ICD-10-CM | POA: Diagnosis not present

## 2015-12-16 DIAGNOSIS — S2241XD Multiple fractures of ribs, right side, subsequent encounter for fracture with routine healing: Secondary | ICD-10-CM | POA: Diagnosis not present

## 2015-12-16 DIAGNOSIS — S79911A Unspecified injury of right hip, initial encounter: Secondary | ICD-10-CM | POA: Diagnosis not present

## 2015-12-16 DIAGNOSIS — Z79899 Other long term (current) drug therapy: Secondary | ICD-10-CM | POA: Insufficient documentation

## 2015-12-16 DIAGNOSIS — Y999 Unspecified external cause status: Secondary | ICD-10-CM | POA: Insufficient documentation

## 2015-12-16 DIAGNOSIS — Y939 Activity, unspecified: Secondary | ICD-10-CM | POA: Insufficient documentation

## 2015-12-16 DIAGNOSIS — S7000XA Contusion of unspecified hip, initial encounter: Secondary | ICD-10-CM

## 2015-12-16 DIAGNOSIS — J45909 Unspecified asthma, uncomplicated: Secondary | ICD-10-CM | POA: Insufficient documentation

## 2015-12-16 DIAGNOSIS — I130 Hypertensive heart and chronic kidney disease with heart failure and stage 1 through stage 4 chronic kidney disease, or unspecified chronic kidney disease: Secondary | ICD-10-CM | POA: Insufficient documentation

## 2015-12-16 DIAGNOSIS — W010XXA Fall on same level from slipping, tripping and stumbling without subsequent striking against object, initial encounter: Secondary | ICD-10-CM | POA: Insufficient documentation

## 2015-12-16 DIAGNOSIS — J449 Chronic obstructive pulmonary disease, unspecified: Secondary | ICD-10-CM | POA: Insufficient documentation

## 2015-12-16 DIAGNOSIS — I5032 Chronic diastolic (congestive) heart failure: Secondary | ICD-10-CM | POA: Diagnosis not present

## 2015-12-16 NOTE — ED Triage Notes (Signed)
Reports of slipping in water today and fell. Complains of right hip pain. Denies LOC. Denies neck/back pain.

## 2015-12-16 NOTE — ED Provider Notes (Signed)
[facility] DEPT Provider Note   CSN: [id number] Arrival date & time: 12/16/15  1558     History   Chief Complaint Chief Complaint  Patient presents with  . Fall  . Hip Pain    HPI [name] is a 80 y.o. female.  Pt slipped on some water today and landed on her right hip.  Pt has osteoporosis, so her daughter wanted to make sure she did not break it.  She has a hx of ambulatory dysfunction and is supposed to walk with a walker.  She was not using it when she fell.  Pt said that she has been able to walk on her leg.  Pt denies any other new injury.      Past Medical History:  Diagnosis Date  . Acute on chronic diastolic heart failure (HCC) 04/11/2014  . Asthma   . Bradycardia   . CAD (coronary artery disease) 08/2009   s/p PCI of the LAD  . Carotid artery stenosis   . Chronic headache   . COPD (chronic obstructive pulmonary disease) (HCC)   . Dementia    patient denies this  . Hyperlipidemia   . Hypertension   . Impaired fasting glucose   . Myocardial infarction    Non-st segment elevated  . On home O2   . Osteoporosis 2009  . Pneumonia   . Seasonal allergies   . Ulnar neuropathy     Patient Active Problem List   Diagnosis Date Noted  . Multiple fractures of ribs, right side, initial encounter for closed fracture 12/03/2015  . Rib fractures 12/03/2015  . Chronic respiratory failure (HCC) 12/03/2015  . COPD (chronic obstructive pulmonary disease) (HCC) 12/03/2015  . Senile purpura (HCC) 09/07/2015  . Benign essential tremor 09/07/2015  . Chronic diastolic congestive heart failure (HCC) 04/20/2015  . Renal insufficiency 04/25/2014  . Acute on chronic diastolic heart failure (HCC) 04/11/2014  . Acute on chronic respiratory failure (HCC) 04/11/2014  . CKD (chronic kidney disease) stage 3, GFR 30-59 ml/min 04/11/2014  . Prediabetes 12/03/2013  . Hypertrophic obstructive cardiomyopathy (HCC) 11/09/2013  . Hypoxia 08/28/2013  . Respiratory failure  with hypoxia (HCC) 05/07/2013  . Acute diastolic CHF (congestive heart failure) (HCC) 05/07/2013  . COPD exacerbation (HCC) 02/15/2013  . Hyperlipidemia 11/09/2010  . Acute diastolic heart failure (HCC) 10/15/2010  . CAD, NATIVE VESSEL 09/18/2009  . BRADYCARDIA 08/19/2009  . Dementia 07/19/2007  . Essential hypertension 07/19/2007  . CAROTID ARTERY STENOSIS 07/19/2007  . ASTHMA 07/19/2007  . COPD with asthma (HCC) 07/19/2007  . HEADACHE, CHRONIC, HX OF 07/19/2007    Past Surgical History:  Procedure Laterality Date  . BLADDER SURGERY  1991  . CARDIAC CATHETERIZATION  11/2010   negative  . CATARACT EXTRACTION  Nov 2016  . LEFT HEART CATHETERIZATION WITH CORONARY ANGIOGRAM N/A 11/05/2012   Procedure: LEFT HEART CATHETERIZATION WITH CORONARY ANGIOGRAM;  Surgeon: Micheline Chapman, MD;  Location: [facility] CATH LAB;  Service: Cardiovascular;  Laterality: N/A;  . PARTIAL HYSTERECTOMY  1984  . Radial artery catheter     Bladder    OB History    Gravida Para Term Preterm AB Living             3   SAB TAB Ectopic Multiple Live Births                   Home Medications    Prior to Admission medications   Medication Sig Start Date End Date Taking? Authorizing Provider  acetaminophen (TYLENOL) 325 MG tablet Take 650 mg by mouth every 6 (six) hours as needed for moderate pain.    Historical Provider, MD  albuterol (PROVENTIL HFA;VENTOLIN HFA) 108 (90 Base) MCG/ACT inhaler Inhale 2 puffs into the lungs 3 (three) times daily. 09/07/15   Babs Sciara, MD  albuterol (PROVENTIL) (2.5 MG/3ML) 0.083% nebulizer solution Take 3 mLs (2.5 mg total) by nebulization every 4 (four) hours as needed for wheezing or shortness of breath. 10/12/15   Babs Sciara, MD  alendronate (FOSAMAX) 70 MG tablet TAKE 1 TABLET BY MOUTH 1 TIME WEEKLY AS DIRECTED 11/06/15   Babs Sciara, MD  amLODipine (NORVASC) 5 MG tablet TAKE 1/2 TABLET BY MOUTH DAILY 07/09/15   Babs Sciara, MD  amoxicillin (AMOXIL) 500 MG capsule  Take 1 capsule (500 mg total) by mouth 3 (three) times daily. X 7 days 12/07/15   Babs Sciara, MD  Ascorbic Acid (VITAMIN C PO) Take 1 tablet by mouth daily.    Historical Provider, MD  aspirin 325 MG tablet Take 325 mg by mouth daily.    Historical Provider, MD  budesonide-formoterol (SYMBICORT) 160-4.5 MCG/ACT inhaler Inhale 2 puffs into the lungs 2 (two) times daily. 08/28/13   Babs Sciara, MD  Calcium Carbonate (CALCIUM 600 PO) Take 1 tablet by mouth daily.     Historical Provider, MD  cetirizine (ZYRTEC) 10 MG tablet Take 10 mg by mouth daily.    Historical Provider, MD  Cholecalciferol (VITAMIN D3) 1000 UNITS CAPS Take 1 capsule by mouth daily.    Historical Provider, MD  donepezil (ARICEPT) 10 MG tablet TAKE 1 TABLET BY MOUTH EVERY NIGHT AT BEDTIME 09/17/15   Babs Sciara, MD  furosemide (LASIX) 40 MG tablet Take 2 tablets every morning by mouth. 01/06/15   Henderson Cloud, MD  HYDROcodone-acetaminophen (NORCO/VICODIN) 5-325 MG tablet Take 1-2 tablets by mouth every 4 (four) hours as needed for moderate pain. 12/04/15   Erick Blinks, MD  isosorbide mononitrate (IMDUR) 30 MG 24 hr tablet Take 0.5 tablets (15 mg total) by mouth daily. 08/10/15   Rosalio Macadamia, NP  LORazepam (ATIVAN) 1 MG tablet TAKE 1/2 TABLET TO 1 TABLET BY MOUTH EVERY NIGHT AT BEDTIME AS NEEDED 09/30/15   Merlyn Albert, MD  losartan (COZAAR) 100 MG tablet TAKE 1 TABLET BY MOUTH EVERY DAY 12/04/15   Babs Sciara, MD  nitroGLYCERIN (NITROSTAT) 0.4 MG SL tablet Place 1 tablet (0.4 mg total) under the tongue as needed for chest pain. 12/18/14   Babs Sciara, MD  omeprazole (PRILOSEC) 20 MG capsule Take 20 mg by mouth daily.    Historical Provider, MD  ondansetron (ZOFRAN) 8 MG tablet Take 1 tablet (8 mg total) by mouth every 8 (eight) hours as needed for nausea. 12/08/15   Babs Sciara, MD  OVER THE COUNTER MEDICATION Gas relief    Historical Provider, MD  polyethylene glycol (MIRALAX / GLYCOLAX) packet  Take 17 g by mouth daily as needed for moderate constipation. 04/16/14   Elliot Cousin, MD  potassium chloride (K-DUR,KLOR-CON) 10 MEQ tablet TAKE 1 TABLET(10 MEQ) BY MOUTH TWICE DAILY 12/02/15   Babs Sciara, MD  pravastatin (PRAVACHOL) 40 MG tablet TAKE 1 TABLET BY MOUTH EVERY DAY 11/06/15   Babs Sciara, MD  ranitidine (ZANTAC) 150 MG tablet Take 150 mg by mouth daily as needed for heartburn.    Historical Provider, MD  sertraline (ZOLOFT) 100 MG tablet TAKE ONE AND ONE  HALF(1 1/2) TABLET BY MOUTH DAILY 08/21/15   Babs Sciara, MD  TRAVATAN Z 0.004 % SOLN ophthalmic solution Place 1 drop into the left eye at bedtime.  10/08/14   Historical Provider, MD    Family History Family History  Problem Relation Age of Onset  . Addison's disease Mother   . Alcohol abuse Father     Social History Social History  Substance Use Topics  . Smoking status: Former Smoker    Packs/day: 1.00    Years: 35.00    Quit date: 02/15/2008  . Smokeless tobacco: Never Used  . Alcohol use No     Allergies   Codeine; Dilaudid [hydromorphone hcl]; Erythromycin; and Sulfonamide derivatives   Review of Systems Review of Systems  Musculoskeletal:       Right hip pain  All other systems reviewed and are negative.    Physical Exam Updated Vital Signs BP 141/57 (BP Location: Left Arm)   Pulse 65   Temp 98.3 F (36.8 C) (Oral)   Resp 18   Ht 5\' 3"  (1.6 m)   Wt 179 lb (81.2 kg)   SpO2 92%   BMI 31.71 kg/m   Physical Exam  Constitutional: She is oriented to person, place, and time. She appears well-developed and well-nourished.  HENT:  Head: Normocephalic and atraumatic.  Right Ear: External ear normal.  Left Ear: External ear normal.  Nose: Nose normal.  Mouth/Throat: Oropharynx is clear and moist.  Eyes: Conjunctivae and EOM are normal. Pupils are equal, round, and reactive to light.  Neck: Normal range of motion. Neck supple.  Cardiovascular: Normal rate, regular rhythm, normal heart sounds  and intact distal pulses.   Pulmonary/Chest: Effort normal and breath sounds normal.  Abdominal: Soft. Bowel sounds are normal.  Musculoskeletal: Normal range of motion.       Legs: Neurological: She is alert and oriented to person, place, and time.  Skin: Skin is warm.  Psychiatric: She has a normal mood and affect. Her behavior is normal. Judgment and thought content normal.  Nursing note and vitals reviewed.    ED Treatments / Results  Labs (all labs ordered are listed, but only abnormal results are displayed) Labs Reviewed - No data to display  EKG  EKG Interpretation None       Radiology Dg Hip Unilat  With Pelvis 2-3 Views Right  Result Date: 12/16/2015 CLINICAL DATA:  Patient's slipped and fell today and now complains of right hip pain. EXAM: DG HIP (WITH OR WITHOUT PELVIS) 2-3V RIGHT COMPARISON:  Pelvis and right hip of December 03, 2015 FINDINGS: The bones are subjectively osteopenic. The pelvis exhibits no acute fracture or lytic or blastic lesion. AP and lateral views of the right hip reveal preservation of the joint space. The articular surfaces of the femoral head and acetabulum remains smoothly rounded. The femoral neck, intertrochanteric, and subtrochanteric regions are normal. The soft tissues of the pelvis exhibit no acute abnormalities. IMPRESSION: There is no acute or significant chronic bony abnormality of the right hip. Electronically Signed   By: David  [place] M.D.   On: 12/16/2015 17:07    Procedures Procedures (including critical care time)  Medications Ordered in ED Medications - No data to display   Initial Impression / Assessment and Plan / ED Course  I have reviewed the triage vital signs and the nursing notes.  Pertinent labs & imaging results that were available during my care of the patient were reviewed by me and considered in my medical  decision making (see chart for details).  Clinical Course    X-ray is negative.  Pt is able to ambulate.   She is ok for d/c.  Final Clinical Impressions(s) / ED Diagnoses   Final diagnoses:  Contusion of hip, initial encounter    New Prescriptions Discharge Medication List as of 12/16/2015  6:04 PM       Jacalyn LefevreJulie Taiyana Kissler, MD 12/16/15 2040

## 2015-12-17 DIAGNOSIS — I130 Hypertensive heart and chronic kidney disease with heart failure and stage 1 through stage 4 chronic kidney disease, or unspecified chronic kidney disease: Secondary | ICD-10-CM | POA: Diagnosis not present

## 2015-12-17 DIAGNOSIS — S2241XD Multiple fractures of ribs, right side, subsequent encounter for fracture with routine healing: Secondary | ICD-10-CM | POA: Diagnosis not present

## 2015-12-17 DIAGNOSIS — N183 Chronic kidney disease, stage 3 (moderate): Secondary | ICD-10-CM | POA: Diagnosis not present

## 2015-12-17 DIAGNOSIS — J9610 Chronic respiratory failure, unspecified whether with hypoxia or hypercapnia: Secondary | ICD-10-CM | POA: Diagnosis not present

## 2015-12-17 DIAGNOSIS — I5032 Chronic diastolic (congestive) heart failure: Secondary | ICD-10-CM | POA: Diagnosis not present

## 2015-12-17 DIAGNOSIS — H34832 Tributary (branch) retinal vein occlusion, left eye, with macular edema: Secondary | ICD-10-CM | POA: Diagnosis not present

## 2015-12-17 DIAGNOSIS — J449 Chronic obstructive pulmonary disease, unspecified: Secondary | ICD-10-CM | POA: Diagnosis not present

## 2015-12-18 DIAGNOSIS — S2241XD Multiple fractures of ribs, right side, subsequent encounter for fracture with routine healing: Secondary | ICD-10-CM | POA: Diagnosis not present

## 2015-12-18 DIAGNOSIS — I5032 Chronic diastolic (congestive) heart failure: Secondary | ICD-10-CM | POA: Diagnosis not present

## 2015-12-18 DIAGNOSIS — N183 Chronic kidney disease, stage 3 (moderate): Secondary | ICD-10-CM | POA: Diagnosis not present

## 2015-12-18 DIAGNOSIS — I130 Hypertensive heart and chronic kidney disease with heart failure and stage 1 through stage 4 chronic kidney disease, or unspecified chronic kidney disease: Secondary | ICD-10-CM | POA: Diagnosis not present

## 2015-12-18 DIAGNOSIS — J449 Chronic obstructive pulmonary disease, unspecified: Secondary | ICD-10-CM | POA: Diagnosis not present

## 2015-12-18 DIAGNOSIS — J9610 Chronic respiratory failure, unspecified whether with hypoxia or hypercapnia: Secondary | ICD-10-CM | POA: Diagnosis not present

## 2015-12-21 DIAGNOSIS — J9610 Chronic respiratory failure, unspecified whether with hypoxia or hypercapnia: Secondary | ICD-10-CM | POA: Diagnosis not present

## 2015-12-21 DIAGNOSIS — I5032 Chronic diastolic (congestive) heart failure: Secondary | ICD-10-CM | POA: Diagnosis not present

## 2015-12-21 DIAGNOSIS — S2241XD Multiple fractures of ribs, right side, subsequent encounter for fracture with routine healing: Secondary | ICD-10-CM | POA: Diagnosis not present

## 2015-12-21 DIAGNOSIS — I130 Hypertensive heart and chronic kidney disease with heart failure and stage 1 through stage 4 chronic kidney disease, or unspecified chronic kidney disease: Secondary | ICD-10-CM | POA: Diagnosis not present

## 2015-12-21 DIAGNOSIS — N183 Chronic kidney disease, stage 3 (moderate): Secondary | ICD-10-CM | POA: Diagnosis not present

## 2015-12-21 DIAGNOSIS — J449 Chronic obstructive pulmonary disease, unspecified: Secondary | ICD-10-CM | POA: Diagnosis not present

## 2015-12-22 DIAGNOSIS — J9610 Chronic respiratory failure, unspecified whether with hypoxia or hypercapnia: Secondary | ICD-10-CM | POA: Diagnosis not present

## 2015-12-22 DIAGNOSIS — J449 Chronic obstructive pulmonary disease, unspecified: Secondary | ICD-10-CM | POA: Diagnosis not present

## 2015-12-22 DIAGNOSIS — I130 Hypertensive heart and chronic kidney disease with heart failure and stage 1 through stage 4 chronic kidney disease, or unspecified chronic kidney disease: Secondary | ICD-10-CM | POA: Diagnosis not present

## 2015-12-22 DIAGNOSIS — I5032 Chronic diastolic (congestive) heart failure: Secondary | ICD-10-CM | POA: Diagnosis not present

## 2015-12-22 DIAGNOSIS — N183 Chronic kidney disease, stage 3 (moderate): Secondary | ICD-10-CM | POA: Diagnosis not present

## 2015-12-22 DIAGNOSIS — S2241XD Multiple fractures of ribs, right side, subsequent encounter for fracture with routine healing: Secondary | ICD-10-CM | POA: Diagnosis not present

## 2015-12-23 DIAGNOSIS — I5032 Chronic diastolic (congestive) heart failure: Secondary | ICD-10-CM | POA: Diagnosis not present

## 2015-12-23 DIAGNOSIS — J449 Chronic obstructive pulmonary disease, unspecified: Secondary | ICD-10-CM | POA: Diagnosis not present

## 2015-12-23 DIAGNOSIS — I130 Hypertensive heart and chronic kidney disease with heart failure and stage 1 through stage 4 chronic kidney disease, or unspecified chronic kidney disease: Secondary | ICD-10-CM | POA: Diagnosis not present

## 2015-12-23 DIAGNOSIS — S2241XD Multiple fractures of ribs, right side, subsequent encounter for fracture with routine healing: Secondary | ICD-10-CM | POA: Diagnosis not present

## 2015-12-23 DIAGNOSIS — N183 Chronic kidney disease, stage 3 (moderate): Secondary | ICD-10-CM | POA: Diagnosis not present

## 2015-12-23 DIAGNOSIS — J9610 Chronic respiratory failure, unspecified whether with hypoxia or hypercapnia: Secondary | ICD-10-CM | POA: Diagnosis not present

## 2015-12-24 DIAGNOSIS — I130 Hypertensive heart and chronic kidney disease with heart failure and stage 1 through stage 4 chronic kidney disease, or unspecified chronic kidney disease: Secondary | ICD-10-CM | POA: Diagnosis not present

## 2015-12-24 DIAGNOSIS — J9610 Chronic respiratory failure, unspecified whether with hypoxia or hypercapnia: Secondary | ICD-10-CM | POA: Diagnosis not present

## 2015-12-24 DIAGNOSIS — J449 Chronic obstructive pulmonary disease, unspecified: Secondary | ICD-10-CM | POA: Diagnosis not present

## 2015-12-24 DIAGNOSIS — S2241XD Multiple fractures of ribs, right side, subsequent encounter for fracture with routine healing: Secondary | ICD-10-CM | POA: Diagnosis not present

## 2015-12-24 DIAGNOSIS — I5032 Chronic diastolic (congestive) heart failure: Secondary | ICD-10-CM | POA: Diagnosis not present

## 2015-12-24 DIAGNOSIS — N183 Chronic kidney disease, stage 3 (moderate): Secondary | ICD-10-CM | POA: Diagnosis not present

## 2015-12-25 DIAGNOSIS — N183 Chronic kidney disease, stage 3 (moderate): Secondary | ICD-10-CM | POA: Diagnosis not present

## 2015-12-25 DIAGNOSIS — S2241XD Multiple fractures of ribs, right side, subsequent encounter for fracture with routine healing: Secondary | ICD-10-CM | POA: Diagnosis not present

## 2015-12-25 DIAGNOSIS — I5032 Chronic diastolic (congestive) heart failure: Secondary | ICD-10-CM | POA: Diagnosis not present

## 2015-12-25 DIAGNOSIS — J449 Chronic obstructive pulmonary disease, unspecified: Secondary | ICD-10-CM | POA: Diagnosis not present

## 2015-12-25 DIAGNOSIS — I130 Hypertensive heart and chronic kidney disease with heart failure and stage 1 through stage 4 chronic kidney disease, or unspecified chronic kidney disease: Secondary | ICD-10-CM | POA: Diagnosis not present

## 2015-12-25 DIAGNOSIS — J9610 Chronic respiratory failure, unspecified whether with hypoxia or hypercapnia: Secondary | ICD-10-CM | POA: Diagnosis not present

## 2015-12-28 ENCOUNTER — Telehealth: Payer: Self-pay | Admitting: Family Medicine

## 2015-12-28 DIAGNOSIS — J449 Chronic obstructive pulmonary disease, unspecified: Secondary | ICD-10-CM | POA: Diagnosis not present

## 2015-12-28 DIAGNOSIS — N183 Chronic kidney disease, stage 3 (moderate): Secondary | ICD-10-CM | POA: Diagnosis not present

## 2015-12-28 DIAGNOSIS — J9610 Chronic respiratory failure, unspecified whether with hypoxia or hypercapnia: Secondary | ICD-10-CM | POA: Diagnosis not present

## 2015-12-28 DIAGNOSIS — I5032 Chronic diastolic (congestive) heart failure: Secondary | ICD-10-CM | POA: Diagnosis not present

## 2015-12-28 DIAGNOSIS — I130 Hypertensive heart and chronic kidney disease with heart failure and stage 1 through stage 4 chronic kidney disease, or unspecified chronic kidney disease: Secondary | ICD-10-CM | POA: Diagnosis not present

## 2015-12-28 DIAGNOSIS — S2241XD Multiple fractures of ribs, right side, subsequent encounter for fracture with routine healing: Secondary | ICD-10-CM | POA: Diagnosis not present

## 2015-12-28 NOTE — Telephone Encounter (Signed)
Pt daughter called to advise doctor that pt has had some outbursts and behavioral issues She would like to speak to Dr. Lorin Picket regarding this issue. She also wanted to let him know that Arline Asp (pt other daughter) will be bringing In the patient for her appointment on 12/29/15.

## 2015-12-29 ENCOUNTER — Encounter: Payer: Self-pay | Admitting: Family Medicine

## 2015-12-29 ENCOUNTER — Ambulatory Visit (INDEPENDENT_AMBULATORY_CARE_PROVIDER_SITE_OTHER): Payer: Medicare Other | Admitting: Family Medicine

## 2015-12-29 ENCOUNTER — Telehealth: Payer: Self-pay | Admitting: Family Medicine

## 2015-12-29 VITALS — BP 110/80 | Ht 63.0 in | Wt 173.5 lb

## 2015-12-29 DIAGNOSIS — N183 Chronic kidney disease, stage 3 (moderate): Secondary | ICD-10-CM | POA: Diagnosis not present

## 2015-12-29 DIAGNOSIS — I5032 Chronic diastolic (congestive) heart failure: Secondary | ICD-10-CM | POA: Diagnosis not present

## 2015-12-29 DIAGNOSIS — J9610 Chronic respiratory failure, unspecified whether with hypoxia or hypercapnia: Secondary | ICD-10-CM | POA: Diagnosis not present

## 2015-12-29 DIAGNOSIS — D692 Other nonthrombocytopenic purpura: Secondary | ICD-10-CM

## 2015-12-29 DIAGNOSIS — F0390 Unspecified dementia without behavioral disturbance: Secondary | ICD-10-CM

## 2015-12-29 DIAGNOSIS — D509 Iron deficiency anemia, unspecified: Secondary | ICD-10-CM

## 2015-12-29 DIAGNOSIS — J449 Chronic obstructive pulmonary disease, unspecified: Secondary | ICD-10-CM

## 2015-12-29 DIAGNOSIS — I6523 Occlusion and stenosis of bilateral carotid arteries: Secondary | ICD-10-CM

## 2015-12-29 DIAGNOSIS — S2241XD Multiple fractures of ribs, right side, subsequent encounter for fracture with routine healing: Secondary | ICD-10-CM | POA: Diagnosis not present

## 2015-12-29 DIAGNOSIS — I10 Essential (primary) hypertension: Secondary | ICD-10-CM

## 2015-12-29 DIAGNOSIS — I130 Hypertensive heart and chronic kidney disease with heart failure and stage 1 through stage 4 chronic kidney disease, or unspecified chronic kidney disease: Secondary | ICD-10-CM | POA: Diagnosis not present

## 2015-12-29 MED ORDER — BUSPIRONE HCL 7.5 MG PO TABS: 7.5000 mg | ORAL_TABLET | Freq: Three times a day (TID) | ORAL | 3 refills | Status: DC

## 2015-12-29 MED ORDER — POTASSIUM CHLORIDE CRYS ER 10 MEQ PO TBCR: EXTENDED_RELEASE_TABLET | ORAL | 3 refills | Status: DC

## 2015-12-29 NOTE — Telephone Encounter (Signed)
/  I did speak with [name]

## 2015-12-29 NOTE — Telephone Encounter (Signed)
Pt's daughter called in

## 2015-12-29 NOTE — Patient Instructions (Signed)
Due to platelets being lower on recent lab in the ER please do the following: Reduce the aspirin to 81 mg one a day Do labs

## 2015-12-29 NOTE — Progress Notes (Signed)
   Subjective:    Patient ID: [name], female    DOB: 21-Mar-1935, 80 y.o.   MRN: [id number]  HPI Patient is here today for a follow up visit from a fall she had a few weeks ago. Patient states that her rib pain has improved a lot. Patient has no new concerns at this time.   Please see telephone note on this patient from this morning. Family has some concerns but can not mention it in the room with the patient without making her upset.  The patient relates some soreness in the ribs. But it is doing much better. She states typically she uses a cane to get around but for some reason she stepped off a lead just on the garage and it caused her to fall Patient needs potassium for 90 day supply.  ER record reviewed with the patient CBC shows significant drop in hemoglobin patient denies bleeding issues Review of Systems I had a long discussion with the daughter as well as granddaughter via phone while the patient was there and they both relate that the patient gets agitated quickly angry quickly demanding in somewhat difficult to live with. Apparently not depressed patient denies being depressed not suicidal. She does admit that she gets on edge a lot it's angry quickly.    Objective:   Physical Exam Lungs are clear hearts regular pulse normal BP good extremities no edema 25 minutes spent talking with the patient her daughter who is in the room in the granddaughter on the phone with the presence of the patient  Senile purpura noted on the arms    Assessment & Plan:  The agitation is not an easy issue it comes along with the dementia Start BuSpar 7.5 mg initially one twice daily one she tolerates this over the next 2 weeks we will bump it up to 3 times a day Because a diuretic we'll check metabolic 7 Concerning anemia issue going on Senile purpura on the arms We will do some additional lab work. Reduce aspirin from 325 mg to 81 mg Follow-up in 2 months

## 2015-12-30 DIAGNOSIS — I5032 Chronic diastolic (congestive) heart failure: Secondary | ICD-10-CM | POA: Diagnosis not present

## 2015-12-30 DIAGNOSIS — J449 Chronic obstructive pulmonary disease, unspecified: Secondary | ICD-10-CM | POA: Diagnosis not present

## 2015-12-30 DIAGNOSIS — J9610 Chronic respiratory failure, unspecified whether with hypoxia or hypercapnia: Secondary | ICD-10-CM | POA: Diagnosis not present

## 2015-12-30 DIAGNOSIS — I130 Hypertensive heart and chronic kidney disease with heart failure and stage 1 through stage 4 chronic kidney disease, or unspecified chronic kidney disease: Secondary | ICD-10-CM | POA: Diagnosis not present

## 2015-12-30 DIAGNOSIS — S2241XD Multiple fractures of ribs, right side, subsequent encounter for fracture with routine healing: Secondary | ICD-10-CM | POA: Diagnosis not present

## 2015-12-30 DIAGNOSIS — N183 Chronic kidney disease, stage 3 (moderate): Secondary | ICD-10-CM | POA: Diagnosis not present

## 2015-12-30 LAB — BASIC METABOLIC PANEL
BUN/Creatinine Ratio: 17 (ref 12–28)
BUN: 35 mg/dL — ABNORMAL HIGH (ref 8–27)
CALCIUM: 8.8 mg/dL (ref 8.7–10.3)
CHLORIDE: 96 mmol/L (ref 96–106)
CO2: 25 mmol/L (ref 18–29)
Creatinine, Ser: 2.05 mg/dL — ABNORMAL HIGH (ref 0.57–1.00)
GFR calc Af Amer: 26 mL/min/{1.73_m2} — ABNORMAL LOW (ref >59)
GFR, EST NON AFRICAN AMERICAN: 23 mL/min/{1.73_m2} — AB (ref >59)
GLUCOSE: 96 mg/dL (ref 65–99)
POTASSIUM: 5.1 mmol/L (ref 3.5–5.2)
SODIUM: 142 mmol/L (ref 134–144)

## 2015-12-30 LAB — CBC WITH DIFFERENTIAL/PLATELET
BASOS ABS: 0.1 10*3/uL (ref 0.0–0.2)
BASOS: 1 %
EOS (ABSOLUTE): 0.1 10*3/uL (ref 0.0–0.4)
Eos: 2 %
Hematocrit: 34.1 % (ref 34.0–46.6)
Hemoglobin: 10.8 g/dL — ABNORMAL LOW (ref 11.1–15.9)
IMMATURE GRANS (ABS): 0 10*3/uL (ref 0.0–0.1)
IMMATURE GRANULOCYTES: 0 %
LYMPHS: 12 %
Lymphocytes Absolute: 0.8 10*3/uL (ref 0.7–3.1)
MCH: 26.7 pg (ref 26.6–33.0)
MCHC: 31.7 g/dL (ref 31.5–35.7)
MCV: 84 fL (ref 79–97)
MONOS ABS: 0.6 10*3/uL (ref 0.1–0.9)
Monocytes: 9 %
NEUTROS PCT: 76 %
Neutrophils Absolute: 4.8 10*3/uL (ref 1.4–7.0)
PLATELETS: 198 10*3/uL (ref 150–379)
RBC: 4.05 x10E6/uL (ref 3.77–5.28)
RDW: 15.4 % (ref 12.3–15.4)
WBC: 6.4 10*3/uL (ref 3.4–10.8)

## 2015-12-30 LAB — IRON AND TIBC
IRON SATURATION: 14 % — AB (ref 15–55)
Iron: 57 ug/dL (ref 27–139)
TIBC: 397 ug/dL (ref 250–450)
UIBC: 340 ug/dL (ref 118–369)

## 2015-12-30 LAB — FERRITIN: Ferritin: 68 ng/mL (ref 15–150)

## 2015-12-30 MED ORDER — FUROSEMIDE 20 MG PO TABS: 20.0000 mg | ORAL_TABLET | Freq: Every day | ORAL | 3 refills | Status: DC

## 2015-12-30 NOTE — Addendum Note (Signed)
Addended by: Theodora Blow R on: 12/30/2015 10:51 AM   Modules accepted: Orders

## 2015-12-31 DIAGNOSIS — I130 Hypertensive heart and chronic kidney disease with heart failure and stage 1 through stage 4 chronic kidney disease, or unspecified chronic kidney disease: Secondary | ICD-10-CM | POA: Diagnosis not present

## 2015-12-31 DIAGNOSIS — N183 Chronic kidney disease, stage 3 (moderate): Secondary | ICD-10-CM | POA: Diagnosis not present

## 2015-12-31 DIAGNOSIS — S2241XD Multiple fractures of ribs, right side, subsequent encounter for fracture with routine healing: Secondary | ICD-10-CM | POA: Diagnosis not present

## 2015-12-31 DIAGNOSIS — J449 Chronic obstructive pulmonary disease, unspecified: Secondary | ICD-10-CM | POA: Diagnosis not present

## 2015-12-31 DIAGNOSIS — I5032 Chronic diastolic (congestive) heart failure: Secondary | ICD-10-CM | POA: Diagnosis not present

## 2015-12-31 DIAGNOSIS — J9610 Chronic respiratory failure, unspecified whether with hypoxia or hypercapnia: Secondary | ICD-10-CM | POA: Diagnosis not present

## 2016-01-01 DIAGNOSIS — S2241XD Multiple fractures of ribs, right side, subsequent encounter for fracture with routine healing: Secondary | ICD-10-CM | POA: Diagnosis not present

## 2016-01-01 DIAGNOSIS — I130 Hypertensive heart and chronic kidney disease with heart failure and stage 1 through stage 4 chronic kidney disease, or unspecified chronic kidney disease: Secondary | ICD-10-CM | POA: Diagnosis not present

## 2016-01-01 DIAGNOSIS — N183 Chronic kidney disease, stage 3 (moderate): Secondary | ICD-10-CM | POA: Diagnosis not present

## 2016-01-01 DIAGNOSIS — J9610 Chronic respiratory failure, unspecified whether with hypoxia or hypercapnia: Secondary | ICD-10-CM | POA: Diagnosis not present

## 2016-01-01 DIAGNOSIS — I5032 Chronic diastolic (congestive) heart failure: Secondary | ICD-10-CM | POA: Diagnosis not present

## 2016-01-01 DIAGNOSIS — J449 Chronic obstructive pulmonary disease, unspecified: Secondary | ICD-10-CM | POA: Diagnosis not present

## 2016-01-04 ENCOUNTER — Encounter: Payer: Self-pay | Admitting: Family Medicine

## 2016-01-04 ENCOUNTER — Ambulatory Visit (INDEPENDENT_AMBULATORY_CARE_PROVIDER_SITE_OTHER): Payer: Medicare Other | Admitting: Family Medicine

## 2016-01-04 VITALS — BP 122/80 | Temp 97.6°F | Ht 63.0 in | Wt 174.8 lb

## 2016-01-04 DIAGNOSIS — J449 Chronic obstructive pulmonary disease, unspecified: Secondary | ICD-10-CM | POA: Diagnosis not present

## 2016-01-04 DIAGNOSIS — B348 Other viral infections of unspecified site: Secondary | ICD-10-CM

## 2016-01-04 DIAGNOSIS — J019 Acute sinusitis, unspecified: Secondary | ICD-10-CM

## 2016-01-04 DIAGNOSIS — S2241XD Multiple fractures of ribs, right side, subsequent encounter for fracture with routine healing: Secondary | ICD-10-CM | POA: Diagnosis not present

## 2016-01-04 DIAGNOSIS — N183 Chronic kidney disease, stage 3 (moderate): Secondary | ICD-10-CM | POA: Diagnosis not present

## 2016-01-04 DIAGNOSIS — J441 Chronic obstructive pulmonary disease with (acute) exacerbation: Secondary | ICD-10-CM | POA: Diagnosis not present

## 2016-01-04 DIAGNOSIS — I130 Hypertensive heart and chronic kidney disease with heart failure and stage 1 through stage 4 chronic kidney disease, or unspecified chronic kidney disease: Secondary | ICD-10-CM | POA: Diagnosis not present

## 2016-01-04 DIAGNOSIS — I5032 Chronic diastolic (congestive) heart failure: Secondary | ICD-10-CM | POA: Diagnosis not present

## 2016-01-04 DIAGNOSIS — I10 Essential (primary) hypertension: Secondary | ICD-10-CM | POA: Diagnosis not present

## 2016-01-04 DIAGNOSIS — J9610 Chronic respiratory failure, unspecified whether with hypoxia or hypercapnia: Secondary | ICD-10-CM | POA: Diagnosis not present

## 2016-01-04 DIAGNOSIS — I6523 Occlusion and stenosis of bilateral carotid arteries: Secondary | ICD-10-CM

## 2016-01-04 DIAGNOSIS — B338 Other specified viral diseases: Secondary | ICD-10-CM

## 2016-01-04 MED ORDER — CEFPROZIL 250 MG PO TABS: 250.0000 mg | ORAL_TABLET | Freq: Two times a day (BID) | ORAL | 0 refills | Status: DC

## 2016-01-04 MED ORDER — PREDNISONE 20 MG PO TABS: ORAL_TABLET | ORAL | 0 refills | Status: DC

## 2016-01-04 NOTE — Progress Notes (Signed)
   Subjective:    Patient ID: [name], female    DOB: 23-Dec-1935, 80 y.o.   MRN: [id number]  Cough  This is a new problem. The current episode started in the past 7 days. Associated symptoms include a fever, headaches, nasal congestion, a sore throat and wheezing. She has tried OTC cough suppressant (tylenol) for the symptoms.  The patient relates 2-3 day history head congestion drainage coughing in addition to this some wheezing    Review of Systems  Constitutional: Positive for fever.  HENT: Positive for sore throat.   Respiratory: Positive for cough and wheezing.   Neurological: Positive for headaches.       Objective:   Physical Exam Bilateral wheezes not severe HEENT benign no crackles in the lungs  O2 sat 96%     Assessment & Plan:  Viral syndrome Parainfluenza Acute rhinosinusitis Antibiotic prescribed warning signs discussed Prelone taper for COPD exacerbation

## 2016-01-05 LAB — BASIC METABOLIC PANEL
BUN/Creatinine Ratio: 11 — ABNORMAL LOW (ref 12–28)
BUN: 18 mg/dL (ref 8–27)
CALCIUM: 9.3 mg/dL (ref 8.7–10.3)
CHLORIDE: 101 mmol/L (ref 96–106)
CO2: 25 mmol/L (ref 18–29)
Creatinine, Ser: 1.61 mg/dL — ABNORMAL HIGH (ref 0.57–1.00)
GFR calc non Af Amer: 30 mL/min/{1.73_m2} — ABNORMAL LOW (ref >59)
GFR, EST AFRICAN AMERICAN: 35 mL/min/{1.73_m2} — AB (ref >59)
GLUCOSE: 97 mg/dL (ref 65–99)
Potassium: 5.6 mmol/L — ABNORMAL HIGH (ref 3.5–5.2)
Sodium: 143 mmol/L (ref 134–144)

## 2016-01-06 ENCOUNTER — Ambulatory Visit: Payer: Medicare Other | Admitting: Family Medicine

## 2016-01-06 ENCOUNTER — Telehealth: Payer: Self-pay | Admitting: Family Medicine

## 2016-01-06 MED ORDER — LOSARTAN POTASSIUM 50 MG PO TABS: 50.0000 mg | ORAL_TABLET | Freq: Every day | ORAL | 0 refills | Status: DC

## 2016-01-06 NOTE — Telephone Encounter (Signed)
Spoke with patient's mother and informed her per Dr.Scott Luking- Stop potassium for now. Reduce the losartan 50 mg. Recheck the met 7 in 2-3 weeks. Monitor blood pressure periodically and send [name] some readings in a few weeks.  Patient's granddaughter verbalized understanding and potassium was removed from patient's medication list per Dr.Scott Luking.

## 2016-01-06 NOTE — Telephone Encounter (Signed)
We called about patient's meds  [name] need to know if we decrease potassium from BID to once daily due to change in her Lasix dose   Please advise

## 2016-01-06 NOTE — Telephone Encounter (Signed)
Stop potassium for now. Remove the medication from her med list. Reduce the losartan 50 mg. Recheck the met 7 in 2-3 weeks. If possible have Stacy monitor blood pressure periodically and send [name] some readings in a few weeks

## 2016-01-06 NOTE — Telephone Encounter (Signed)
Left message return call 01/06/16 

## 2016-01-06 NOTE — Addendum Note (Signed)
Addended by: Jeralene Peters on: 01/06/2016 08:38 AM   Modules accepted: Orders

## 2016-01-06 NOTE — Telephone Encounter (Signed)
Spoke with patient's granddaughter and informed her that Dr.Scott wanted to decreased the Losartan Potassium from 100-50 MG. Patient's granddaughter also stated that a while back Dr.Scott Luking had decreased patient's Lasix dosage but her potassium dosage stayed the same. Patient would like to know if we need to increase potassium as well the Losartan. Patient's granddaughter also wanted to let Dr.Scott know that patient is doing well on Buspar. Since being on Buspar patient's level of agitation has decreased.

## 2016-01-06 NOTE — Addendum Note (Signed)
Addended by: Jeralene Peters on: 01/06/2016 08:41 AM   Modules accepted: Orders

## 2016-01-13 ENCOUNTER — Telehealth: Payer: Self-pay | Admitting: Family Medicine

## 2016-01-13 ENCOUNTER — Other Ambulatory Visit: Payer: Self-pay | Admitting: *Deleted

## 2016-01-13 ENCOUNTER — Other Ambulatory Visit: Payer: Self-pay

## 2016-01-13 ENCOUNTER — Encounter (HOSPITAL_COMMUNITY): Payer: Self-pay | Admitting: Emergency Medicine

## 2016-01-13 ENCOUNTER — Ambulatory Visit (HOSPITAL_COMMUNITY)
Admission: RE | Admit: 2016-01-13 | Discharge: 2016-01-13 | Disposition: A | Discharge status: Home / Self Care | Payer: Medicare Other | Source: Ambulatory Visit | Attending: Family Medicine | Admitting: Family Medicine

## 2016-01-13 ENCOUNTER — Ambulatory Visit (INDEPENDENT_AMBULATORY_CARE_PROVIDER_SITE_OTHER): Payer: Medicare Other | Admitting: Family Medicine

## 2016-01-13 ENCOUNTER — Encounter: Payer: Self-pay | Admitting: Family Medicine

## 2016-01-13 ENCOUNTER — Inpatient Hospital Stay (HOSPITAL_COMMUNITY)
Admission: EM | Admit: 2016-01-13 | Discharge: 2016-01-15 | DRG: 292 | Disposition: A | Discharge status: Home / Self Care | Payer: Medicare Other | Attending: Family Medicine | Admitting: Family Medicine

## 2016-01-13 VITALS — BP 118/72 | Temp 98.5°F | Ht 63.0 in | Wt 174.2 lb

## 2016-01-13 DIAGNOSIS — I248 Other forms of acute ischemic heart disease: Secondary | ICD-10-CM | POA: Diagnosis present

## 2016-01-13 DIAGNOSIS — Z9981 Dependence on supplemental oxygen: Secondary | ICD-10-CM | POA: Diagnosis not present

## 2016-01-13 DIAGNOSIS — R7303 Prediabetes: Secondary | ICD-10-CM | POA: Diagnosis present

## 2016-01-13 DIAGNOSIS — Z885 Allergy status to narcotic agent status: Secondary | ICD-10-CM

## 2016-01-13 DIAGNOSIS — E7439 Other disorders of intestinal carbohydrate absorption: Secondary | ICD-10-CM | POA: Diagnosis present

## 2016-01-13 DIAGNOSIS — R0609 Other forms of dyspnea: Secondary | ICD-10-CM

## 2016-01-13 DIAGNOSIS — R0989 Other specified symptoms and signs involving the circulatory and respiratory systems: Secondary | ICD-10-CM | POA: Diagnosis present

## 2016-01-13 DIAGNOSIS — I130 Hypertensive heart and chronic kidney disease with heart failure and stage 1 through stage 4 chronic kidney disease, or unspecified chronic kidney disease: Secondary | ICD-10-CM | POA: Diagnosis not present

## 2016-01-13 DIAGNOSIS — Z79899 Other long term (current) drug therapy: Secondary | ICD-10-CM

## 2016-01-13 DIAGNOSIS — I10 Essential (primary) hypertension: Secondary | ICD-10-CM | POA: Diagnosis present

## 2016-01-13 DIAGNOSIS — I5032 Chronic diastolic (congestive) heart failure: Secondary | ICD-10-CM | POA: Diagnosis not present

## 2016-01-13 DIAGNOSIS — S2241XD Multiple fractures of ribs, right side, subsequent encounter for fracture with routine healing: Secondary | ICD-10-CM | POA: Diagnosis not present

## 2016-01-13 DIAGNOSIS — F0390 Unspecified dementia without behavioral disturbance: Secondary | ICD-10-CM | POA: Diagnosis present

## 2016-01-13 DIAGNOSIS — R778 Other specified abnormalities of plasma proteins: Secondary | ICD-10-CM | POA: Diagnosis present

## 2016-01-13 DIAGNOSIS — R079 Chest pain, unspecified: Secondary | ICD-10-CM | POA: Diagnosis not present

## 2016-01-13 DIAGNOSIS — I472 Ventricular tachycardia: Secondary | ICD-10-CM | POA: Diagnosis not present

## 2016-01-13 DIAGNOSIS — I6523 Occlusion and stenosis of bilateral carotid arteries: Secondary | ICD-10-CM | POA: Diagnosis not present

## 2016-01-13 DIAGNOSIS — Z7983 Long term (current) use of bisphosphonates: Secondary | ICD-10-CM

## 2016-01-13 DIAGNOSIS — I509 Heart failure, unspecified: Secondary | ICD-10-CM

## 2016-01-13 DIAGNOSIS — M810 Age-related osteoporosis without current pathological fracture: Secondary | ICD-10-CM | POA: Diagnosis present

## 2016-01-13 DIAGNOSIS — Z66 Do not resuscitate: Secondary | ICD-10-CM | POA: Diagnosis present

## 2016-01-13 DIAGNOSIS — Y636 Underdosing and nonadministration of necessary drug, medicament or biological substance: Secondary | ICD-10-CM | POA: Diagnosis present

## 2016-01-13 DIAGNOSIS — I110 Hypertensive heart disease with heart failure: Principal | ICD-10-CM | POA: Diagnosis present

## 2016-01-13 DIAGNOSIS — J441 Chronic obstructive pulmonary disease with (acute) exacerbation: Secondary | ICD-10-CM | POA: Diagnosis present

## 2016-01-13 DIAGNOSIS — Z881 Allergy status to other antibiotic agents status: Secondary | ICD-10-CM

## 2016-01-13 DIAGNOSIS — I5033 Acute on chronic diastolic (congestive) heart failure: Secondary | ICD-10-CM | POA: Diagnosis not present

## 2016-01-13 DIAGNOSIS — Y92019 Unspecified place in single-family (private) house as the place of occurrence of the external cause: Secondary | ICD-10-CM

## 2016-01-13 DIAGNOSIS — I422 Other hypertrophic cardiomyopathy: Secondary | ICD-10-CM | POA: Diagnosis present

## 2016-01-13 DIAGNOSIS — Z79891 Long term (current) use of opiate analgesic: Secondary | ICD-10-CM

## 2016-01-13 DIAGNOSIS — Z7951 Long term (current) use of inhaled steroids: Secondary | ICD-10-CM

## 2016-01-13 DIAGNOSIS — N183 Chronic kidney disease, stage 3 (moderate): Secondary | ICD-10-CM | POA: Diagnosis not present

## 2016-01-13 DIAGNOSIS — J449 Chronic obstructive pulmonary disease, unspecified: Secondary | ICD-10-CM | POA: Diagnosis not present

## 2016-01-13 DIAGNOSIS — R0600 Dyspnea, unspecified: Secondary | ICD-10-CM

## 2016-01-13 DIAGNOSIS — Z882 Allergy status to sulfonamides status: Secondary | ICD-10-CM

## 2016-01-13 DIAGNOSIS — R7989 Other specified abnormal findings of blood chemistry: Secondary | ICD-10-CM

## 2016-01-13 DIAGNOSIS — Z9861 Coronary angioplasty status: Secondary | ICD-10-CM

## 2016-01-13 DIAGNOSIS — I2510 Atherosclerotic heart disease of native coronary artery without angina pectoris: Secondary | ICD-10-CM | POA: Diagnosis present

## 2016-01-13 DIAGNOSIS — J302 Other seasonal allergic rhinitis: Secondary | ICD-10-CM | POA: Diagnosis present

## 2016-01-13 DIAGNOSIS — I252 Old myocardial infarction: Secondary | ICD-10-CM

## 2016-01-13 DIAGNOSIS — J9610 Chronic respiratory failure, unspecified whether with hypoxia or hypercapnia: Secondary | ICD-10-CM | POA: Diagnosis not present

## 2016-01-13 DIAGNOSIS — R05 Cough: Secondary | ICD-10-CM | POA: Diagnosis not present

## 2016-01-13 DIAGNOSIS — T501X6A Underdosing of loop [high-ceiling] diuretics, initial encounter: Secondary | ICD-10-CM | POA: Diagnosis present

## 2016-01-13 DIAGNOSIS — Z87891 Personal history of nicotine dependence: Secondary | ICD-10-CM

## 2016-01-13 DIAGNOSIS — E785 Hyperlipidemia, unspecified: Secondary | ICD-10-CM | POA: Diagnosis present

## 2016-01-13 LAB — MAGNESIUM: Magnesium: 1.8 mg/dL (ref 1.7–2.4)

## 2016-01-13 LAB — CBC WITH DIFFERENTIAL/PLATELET
Basophils Absolute: 0 10*3/uL (ref 0.0–0.1)
Basophils Relative: 0 %
EOS PCT: 3 %
Eosinophils Absolute: 0.2 10*3/uL (ref 0.0–0.7)
HEMATOCRIT: 31.3 % — AB (ref 36.0–46.0)
Hemoglobin: 10 g/dL — ABNORMAL LOW (ref 12.0–15.0)
LYMPHS ABS: 0.5 10*3/uL — AB (ref 0.7–4.0)
LYMPHS PCT: 9 %
MCH: 27.6 pg (ref 26.0–34.0)
MCHC: 31.9 g/dL (ref 30.0–36.0)
MCV: 86.5 fL (ref 78.0–100.0)
MONO ABS: 0.5 10*3/uL (ref 0.1–1.0)
Monocytes Relative: 8 %
Neutro Abs: 4.6 10*3/uL (ref 1.7–7.7)
Neutrophils Relative %: 80 %
PLATELETS: 170 10*3/uL (ref 150–400)
RBC: 3.62 MIL/uL — AB (ref 3.87–5.11)
RDW: 14.2 % (ref 11.5–15.5)
WBC: 5.7 10*3/uL (ref 4.0–10.5)

## 2016-01-13 LAB — BASIC METABOLIC PANEL
Anion gap: 7 (ref 5–15)
BUN: 18 mg/dL (ref 6–20)
CALCIUM: 7.8 mg/dL — AB (ref 8.9–10.3)
CO2: 27 mmol/L (ref 22–32)
CREATININE: 1.18 mg/dL — AB (ref 0.44–1.00)
Chloride: 104 mmol/L (ref 101–111)
GFR calc Af Amer: 50 mL/min — ABNORMAL LOW (ref >60)
GFR calc non Af Amer: 43 mL/min — ABNORMAL LOW (ref >60)
GLUCOSE: 90 mg/dL (ref 65–99)
Potassium: 3.5 mmol/L (ref 3.5–5.1)
Sodium: 138 mmol/L (ref 135–145)

## 2016-01-13 LAB — PHOSPHORUS: Phosphorus: 2.5 mg/dL (ref 2.5–4.6)

## 2016-01-13 LAB — TROPONIN I
TROPONIN I: 0.05 ng/mL — AB (ref <0.03)
Troponin I: 0.05 ng/mL (ref <0.03)

## 2016-01-13 LAB — BRAIN NATRIURETIC PEPTIDE: B Natriuretic Peptide: 622 pg/mL — ABNORMAL HIGH (ref 0.0–100.0)

## 2016-01-13 MED ORDER — ORAL CARE MOUTH RINSE
15.0000 mL | Freq: Two times a day (BID) | OROMUCOSAL | Status: DC
  Administered 2016-01-13: 15 mL via OROMUCOSAL

## 2016-01-13 MED ORDER — VITAMIN D 1000 UNITS PO TABS
1000.0000 [IU] | ORAL_TABLET | Freq: Every day | ORAL | Status: DC
  Administered 2016-01-14 – 2016-01-15 (×2): 1000 [IU] via ORAL
  Filled 2016-01-13 (×2): qty 1

## 2016-01-13 MED ORDER — PANTOPRAZOLE SODIUM 40 MG PO TBEC
40.0000 mg | DELAYED_RELEASE_TABLET | Freq: Every day | ORAL | Status: DC
  Administered 2016-01-14 – 2016-01-15 (×2): 40 mg via ORAL
  Filled 2016-01-13 (×2): qty 1

## 2016-01-13 MED ORDER — AMLODIPINE BESYLATE 5 MG PO TABS
2.5000 mg | ORAL_TABLET | Freq: Every day | ORAL | Status: DC
  Administered 2016-01-14 – 2016-01-15 (×2): 2.5 mg via ORAL
  Filled 2016-01-13 (×2): qty 1

## 2016-01-13 MED ORDER — LORATADINE 10 MG PO TABS
10.0000 mg | ORAL_TABLET | Freq: Every day | ORAL | Status: DC
  Administered 2016-01-14 – 2016-01-15 (×2): 10 mg via ORAL
  Filled 2016-01-13 (×2): qty 1

## 2016-01-13 MED ORDER — ORAL CARE MOUTH RINSE
15.0000 mL | Freq: Two times a day (BID) | OROMUCOSAL | Status: DC
  Administered 2016-01-13 – 2016-01-15 (×4): 15 mL via OROMUCOSAL

## 2016-01-13 MED ORDER — LORAZEPAM 1 MG PO TABS
1.0000 mg | ORAL_TABLET | Freq: Two times a day (BID) | ORAL | Status: DC | PRN
  Administered 2016-01-14 (×2): 1 mg via ORAL
  Filled 2016-01-13 (×2): qty 1

## 2016-01-13 MED ORDER — MAGNESIUM SULFATE 2 GM/50ML IV SOLN
2.0000 g | Freq: Once | INTRAVENOUS | Status: AC
  Administered 2016-01-14: 2 g via INTRAVENOUS
  Filled 2016-01-13: qty 50

## 2016-01-13 MED ORDER — HYDROCODONE-ACETAMINOPHEN 5-325 MG PO TABS
1.0000 | ORAL_TABLET | ORAL | Status: DC | PRN
  Administered 2016-01-14 (×2): 1 via ORAL
  Filled 2016-01-13 (×2): qty 1

## 2016-01-13 MED ORDER — IPRATROPIUM BROMIDE 0.02 % IN SOLN
1.0000 mg | Freq: Once | RESPIRATORY_TRACT | Status: AC
  Administered 2016-01-13: 1 mg via RESPIRATORY_TRACT
  Filled 2016-01-13: qty 5

## 2016-01-13 MED ORDER — FUROSEMIDE 10 MG/ML IJ SOLN
40.0000 mg | Freq: Once | INTRAMUSCULAR | Status: AC
  Administered 2016-01-13: 40 mg via INTRAVENOUS
  Filled 2016-01-13: qty 4

## 2016-01-13 MED ORDER — FUROSEMIDE 10 MG/ML IJ SOLN: 40.0000 mg | Freq: Two times a day (BID) | INTRAMUSCULAR | Status: DC

## 2016-01-13 MED ORDER — ALBUTEROL SULFATE (2.5 MG/3ML) 0.083% IN NEBU: 2.5000 mg | INHALATION_SOLUTION | RESPIRATORY_TRACT | Status: DC | PRN

## 2016-01-13 MED ORDER — ONDANSETRON HCL 4 MG PO TABS: 4.0000 mg | ORAL_TABLET | Freq: Four times a day (QID) | ORAL | Status: DC | PRN

## 2016-01-13 MED ORDER — SIMETHICONE 80 MG PO CHEW: 80.0000 mg | CHEWABLE_TABLET | Freq: Four times a day (QID) | ORAL | Status: DC | PRN

## 2016-01-13 MED ORDER — ALBUTEROL (5 MG/ML) CONTINUOUS INHALATION SOLN
10.0000 mg/h | INHALATION_SOLUTION | Freq: Once | RESPIRATORY_TRACT | Status: AC
  Administered 2016-01-13: 10 mg/h via RESPIRATORY_TRACT
  Filled 2016-01-13: qty 20

## 2016-01-13 MED ORDER — ONDANSETRON HCL 4 MG PO TABS: 8.0000 mg | ORAL_TABLET | Freq: Three times a day (TID) | ORAL | Status: DC | PRN

## 2016-01-13 MED ORDER — POLYETHYLENE GLYCOL 3350 17 G PO PACK: 17.0000 g | PACK | Freq: Every day | ORAL | Status: DC | PRN

## 2016-01-13 MED ORDER — FAMOTIDINE 20 MG PO TABS
20.0000 mg | ORAL_TABLET | Freq: Two times a day (BID) | ORAL | Status: DC
  Administered 2016-01-13 – 2016-01-15 (×4): 20 mg via ORAL
  Filled 2016-01-13 (×4): qty 1

## 2016-01-13 MED ORDER — NITROGLYCERIN 0.4 MG SL SUBL: 0.4000 mg | SUBLINGUAL_TABLET | SUBLINGUAL | Status: DC | PRN

## 2016-01-13 MED ORDER — LOSARTAN POTASSIUM 50 MG PO TABS
50.0000 mg | ORAL_TABLET | Freq: Every day | ORAL | Status: DC
  Administered 2016-01-14: 50 mg via ORAL
  Filled 2016-01-13: qty 1

## 2016-01-13 MED ORDER — SERTRALINE HCL 50 MG PO TABS
150.0000 mg | ORAL_TABLET | Freq: Every day | ORAL | Status: DC
  Administered 2016-01-14 – 2016-01-15 (×2): 150 mg via ORAL
  Filled 2016-01-13 (×2): qty 3

## 2016-01-13 MED ORDER — ONDANSETRON HCL 4 MG/2ML IJ SOLN: 4.0000 mg | Freq: Four times a day (QID) | INTRAMUSCULAR | Status: DC | PRN

## 2016-01-13 MED ORDER — ENOXAPARIN SODIUM 40 MG/0.4ML ~~LOC~~ SOLN
40.0000 mg | SUBCUTANEOUS | Status: DC
  Administered 2016-01-14 – 2016-01-15 (×2): 40 mg via SUBCUTANEOUS
  Filled 2016-01-13 (×2): qty 0.4

## 2016-01-13 MED ORDER — CEFUROXIME AXETIL 250 MG PO TABS
500.0000 mg | ORAL_TABLET | Freq: Two times a day (BID) | ORAL | Status: DC
  Administered 2016-01-13 – 2016-01-15 (×4): 500 mg via ORAL
  Filled 2016-01-13 (×4): qty 2

## 2016-01-13 MED ORDER — IPRATROPIUM-ALBUTEROL 0.5-2.5 (3) MG/3ML IN SOLN
3.0000 mL | Freq: Four times a day (QID) | RESPIRATORY_TRACT | Status: DC
  Filled 2016-01-13: qty 3

## 2016-01-13 MED ORDER — PRAVASTATIN SODIUM 40 MG PO TABS
40.0000 mg | ORAL_TABLET | Freq: Every day | ORAL | Status: DC
  Administered 2016-01-14 – 2016-01-15 (×2): 40 mg via ORAL
  Filled 2016-01-13 (×2): qty 1

## 2016-01-13 MED ORDER — LATANOPROST 0.005 % OP SOLN
1.0000 [drp] | Freq: Every day | OPHTHALMIC | Status: DC
  Administered 2016-01-14: 1 [drp] via OPHTHALMIC
  Filled 2016-01-13: qty 2.5

## 2016-01-13 MED ORDER — METHYLPREDNISOLONE SODIUM SUCC 40 MG IJ SOLR
40.0000 mg | Freq: Four times a day (QID) | INTRAMUSCULAR | Status: DC
  Administered 2016-01-13 – 2016-01-15 (×7): 40 mg via INTRAVENOUS
  Filled 2016-01-13 (×7): qty 1

## 2016-01-13 MED ORDER — METHYLPREDNISOLONE SODIUM SUCC 125 MG IJ SOLR
125.0000 mg | Freq: Once | INTRAMUSCULAR | Status: AC
  Administered 2016-01-13: 125 mg via INTRAVENOUS
  Filled 2016-01-13: qty 2

## 2016-01-13 MED ORDER — ISOSORBIDE MONONITRATE ER 30 MG PO TB24
15.0000 mg | ORAL_TABLET | Freq: Every day | ORAL | Status: DC
  Administered 2016-01-14 – 2016-01-15 (×2): 15 mg via ORAL
  Filled 2016-01-13 (×2): qty 1

## 2016-01-13 MED ORDER — DONEPEZIL HCL 5 MG PO TABS
10.0000 mg | ORAL_TABLET | Freq: Every day | ORAL | Status: DC
  Administered 2016-01-13 – 2016-01-14 (×2): 10 mg via ORAL
  Filled 2016-01-13 (×2): qty 2

## 2016-01-13 NOTE — ED Notes (Signed)
CRITICAL VALUE ALERT  Critical value received:  Troponin 0.05  Date of notification:  01/13/16  Time of notification:  1746  Critical value read back:Yes.    Nurse who received alert:  c Wanisha Shiroma rn  Responding MD:  Dr. Clarene Duke  Time MD responded:  [phone]

## 2016-01-13 NOTE — ED Triage Notes (Signed)
Pt reports increased dyspnea over last several days. Pt reports dyspnea intermittently since October.

## 2016-01-13 NOTE — Progress Notes (Signed)
   Subjective:    Patient ID: [name], female    DOB: September 04, 1935, 80 y.o.   MRN: [id number]  HPI Patient arrives with c/o wheezing and sob. Patient's o2 was 80% after walking to room and came up to 94% after rest This patient was recently seen for what was felt to be a parainfluenza virus both her and her husband had body aches low-grade fevers coughing wheezing This patient has a history of COPD is oxygen dependent. She is noticed over the past 5 days progressive shortness of breath have any use her nebulizer more frequently and in addition to this finds himself feeling short winded both with minimal activity in the house as well as at nighttime when she tries to lay down. She has had echo in the past which showed good ejection fraction she had a chest x-ray earlier today which shows a lot of pulmonary vascular congestion as well as enlarged cardiac silhouette she denies any high fever chills.  Review of Systems No vomiting diarrhea denies chest pain or discomfort relates a lot of fatigue tiredness poor appetite    Objective:   Physical Exam  Lungs bilateral expiratory wheezes patient to Is not using accessory muscles neck no masses heart rate controlled extremities no edema skin warm dry O2 sat 91% Family unable to manage her current symptoms    Assessment & Plan:  Progressive COPD exacerbation Recent parainfluenza viral illness Possible pulmonary vascular congestion may well need BNP possibly need uretic's. I consider this failed outpatient management. Will refer to the ER for further evaluation and possible admission ER doctor spoke with

## 2016-01-13 NOTE — Telephone Encounter (Signed)
Kennyth Arnold is requesting Dr. Lorin Picket to call her back at some point to discuss patient's code status.

## 2016-01-13 NOTE — Telephone Encounter (Signed)
I discussed the situation in detail with family member.

## 2016-01-13 NOTE — ED Notes (Signed)
Assist pt to bathroom via wheelchair  

## 2016-01-13 NOTE — H&P (Signed)
History and Physical    [name] RUE:[id number] DOB: Aug 19, 1935 DOA: 01/13/2016  PCP: Lilyan Punt, MD   Patient coming from: PCP's office.  Chief Complaint: Shortness of breath.  HPI: [name] is a 80 y.o. female with medical history significant of chronic diastolic heart failure, asthma/COPD, bradycardia, CAD, carotid artery stenosis, chronic headache, dementia, hyperlipidemia, hypertension glucose intolerance, osteoporosis, seasonal allergies, pneumonia, ulnar neuropathy who is referred by her PCP's due to shortness of breath office to follow up for recent case of Parainfluenza virus and was noticed to have an O2 saturation of 80% after going from the waiting area to the exam room at the clinic while on 4 LPM of nasal canula oxygen. Her O2 saturation increased to 94% after rest.  Apparently the patient's furosemide was decreased recently by her PCP due to worsening renal function. She normally takes a total of 80 mg daily divided into 2 doses and is currently taking only 40 mg a day. She also mentions that she has not been very compliant with fluid and salt intake. She frequently adds extra salt to her meals. For the past 3-4 days, she has been having progressively worse shortness of breath associated with occasional productive cough with yellowish sputum and fatigue. She denies fever, chills, chest pain, lightheadedness, nausea, emesis, pitting edema of the lower extremities, but states that she has had trouble sleeping due to orthopnea. She denies abdominal pain, diarrhea, constipation, melena or hematochezia. She denies GU symptoms.    ED Course: She was given furosemide 40 and Solu-Medrol 125 mg IVP. She received a continuous 10 mg albuterol nebulization and ipratropium 1 mg via neb as well.   BNP was 622 pg/mL, troponin 0.05 ng/mL, WBC 5.7 hemoglobin 10 g/dL. BUN 18, creatinine 1.18 and glucose 90 mg/dL. Her chest radiograph shows cardiomegaly and pulmonary vascular  congestion.   Review of Systems: As per HPI otherwise 10 point review of systems negative.    Past Medical History:  Diagnosis Date  . Acute on chronic diastolic heart failure (HCC) 04/11/2014  . Asthma   . Bradycardia   . CAD (coronary artery disease) 08/2009   s/p PCI of the LAD  . Carotid artery stenosis   . Chronic headache   . COPD (chronic obstructive pulmonary disease) (HCC)   . Dementia    patient denies this  . Hyperlipidemia   . Hypertension   . Impaired fasting glucose   . Myocardial infarction    Non-st segment elevated  . On home O2   . Osteoporosis 2009  . Pneumonia   . Seasonal allergies   . Ulnar neuropathy     Past Surgical History:  Procedure Laterality Date  . BLADDER SURGERY  1991  . CARDIAC CATHETERIZATION  11/2010   negative  . CATARACT EXTRACTION  Nov 2016  . LEFT HEART CATHETERIZATION WITH CORONARY ANGIOGRAM N/A 11/05/2012   Procedure: LEFT HEART CATHETERIZATION WITH CORONARY ANGIOGRAM;  Surgeon: Micheline Chapman, MD;  Location: [facility] CATH LAB;  Service: Cardiovascular;  Laterality: N/A;  . PARTIAL HYSTERECTOMY  1984  . Radial artery catheter     Bladder     reports that she quit smoking about 7 years ago. She has a 35.00 pack-year smoking history. She has never used smokeless tobacco. She reports that she does not drink alcohol or use drugs.  Allergies  Allergen Reactions  . Codeine Nausea And Vomiting  . Dilaudid [Hydromorphone Hcl] Other (See Comments)    Extremely sensitive  . Erythromycin Other (See  Comments)    Intense stomach pain  . Sulfonamide Derivatives     unknown    Family History  Problem Relation Age of Onset  . Addison's disease Mother   . Alcohol abuse Father     Prior to Admission medications   Medication Sig Start Date End Date Taking? Authorizing Provider  albuterol (PROVENTIL HFA;VENTOLIN HFA) 108 (90 Base) MCG/ACT inhaler Inhale 2 puffs into the lungs 3 (three) times daily. 09/07/15  Yes Babs Sciara, MD    albuterol (PROVENTIL) (2.5 MG/3ML) 0.083% nebulizer solution Take 3 mLs (2.5 mg total) by nebulization every 4 (four) hours as needed for wheezing or shortness of breath. 10/12/15  Yes Babs Sciara, MD  amLODipine (NORVASC) 5 MG tablet TAKE 1/2 TABLET BY MOUTH DAILY 07/09/15  Yes Babs Sciara, MD  Ascorbic Acid (VITAMIN C PO) Take 1 tablet by mouth daily.   Yes Historical Provider, MD  budesonide-formoterol (SYMBICORT) 160-4.5 MCG/ACT inhaler Inhale 2 puffs into the lungs 2 (two) times daily. 08/28/13  Yes Babs Sciara, MD  Calcium Carbonate (CALCIUM 600 PO) Take 1 tablet by mouth daily.    Yes Historical Provider, MD  cefPROZIL (CEFZIL) 250 MG tablet Take 1 tablet (250 mg total) by mouth 2 (two) times daily. 01/04/16  Yes Babs Sciara, MD  cetirizine (ZYRTEC) 10 MG tablet Take 10 mg by mouth daily.   Yes Historical Provider, MD  Cholecalciferol (VITAMIN D3) 1000 UNITS CAPS Take 1 capsule by mouth daily.   Yes Historical Provider, MD  donepezil (ARICEPT) 10 MG tablet TAKE 1 TABLET BY MOUTH EVERY NIGHT AT BEDTIME 09/17/15  Yes Babs Sciara, MD  furosemide (LASIX) 20 MG tablet Take 1 tablet (20 mg total) by mouth daily. 12/30/15  Yes Babs Sciara, MD  HYDROcodone-acetaminophen (NORCO/VICODIN) 5-325 MG tablet Take 1-2 tablets by mouth every 4 (four) hours as needed for moderate pain. 12/04/15  Yes Erick Blinks, MD  isosorbide mononitrate (IMDUR) 30 MG 24 hr tablet Take 0.5 tablets (15 mg total) by mouth daily. 08/10/15  Yes Rosalio Macadamia, NP  LORazepam (ATIVAN) 1 MG tablet TAKE 1/2 TABLET TO 1 TABLET BY MOUTH EVERY NIGHT AT BEDTIME AS NEEDED 09/30/15  Yes Merlyn Albert, MD  losartan (COZAAR) 50 MG tablet Take 1 tablet (50 mg total) by mouth daily. 01/06/16  Yes Babs Sciara, MD  nitroGLYCERIN (NITROSTAT) 0.4 MG SL tablet Place 1 tablet (0.4 mg total) under the tongue as needed for chest pain. 12/18/14  Yes Babs Sciara, MD  omeprazole (PRILOSEC) 20 MG capsule Take 20 mg by mouth daily.    Yes Historical Provider, MD  ondansetron (ZOFRAN) 8 MG tablet Take 1 tablet (8 mg total) by mouth every 8 (eight) hours as needed for nausea. 12/08/15  Yes Babs Sciara, MD  polyethylene glycol (MIRALAX / GLYCOLAX) packet Take 17 g by mouth daily as needed for moderate constipation. 04/16/14  Yes Elliot Cousin, MD  pravastatin (PRAVACHOL) 40 MG tablet TAKE 1 TABLET BY MOUTH EVERY DAY 11/06/15  Yes Babs Sciara, MD  ranitidine (ZANTAC) 150 MG tablet Take 150 mg by mouth daily as needed for heartburn.   Yes Historical Provider, MD  sertraline (ZOLOFT) 100 MG tablet TAKE ONE AND ONE HALF(1 1/2) TABLET BY MOUTH DAILY 08/21/15  Yes Babs Sciara, MD  Simethicone (GAS RELIEF PO) Take 1 tablet by mouth daily as needed (gas relief).   Yes Historical Provider, MD  TRAVATAN Z 0.004 % SOLN ophthalmic solution  Place 1 drop into the left eye at bedtime.  10/08/14  Yes Historical Provider, MD  alendronate (FOSAMAX) 70 MG tablet TAKE 1 TABLET BY MOUTH 1 TIME WEEKLY AS DIRECTED 11/06/15   Babs Sciara, MD  predniSONE (DELTASONE) 20 MG tablet 3qd for 2d then 2qd for 2d then 1qd for 2d Patient not taking: Reported on 01/13/2016 01/04/16   Babs Sciara, MD    Physical Exam:  Constitutional: NAD, calm, comfortable Vitals:   01/13/16 1536 01/13/16 1537 01/13/16 1643 01/13/16 1832  BP: 197/77   (!) 174/137  Pulse: 80   101  Resp: 20   16  Temp: 98.4 F (36.9 C)     TempSrc: Oral     SpO2: 99%  98% 95%  Weight:  78.9 kg (174 lb)    Height:  5\' 3"  (1.6 m)     Eyes: PERRL, lids and conjunctivae normal ENMT: Mucous membranes are moist. Posterior pharynx clear of any exudate or lesions.Upper dentures and partially absent lower dentition Neck: normal, supple, no masses, no thyromegaly Respiratory: Bibasilar rales and mild wheezing. Normal respiratory effort. No accessory muscle use.  Cardiovascular: Regular rate and rhythm, no murmurs / rubs / gallops. No extremity edema. 2+ pedal pulses. No carotid bruits.    Abdomen: Soft, no tenderness, no masses palpated. No hepatosplenomegaly. Bowel sounds positive.  Musculoskeletal: No clubbing / cyanosis. Good ROM, no contractures. Normal muscle tone.  Skin: no rashes, lesions, ulcers. No induration Neurologic: CN 2-12 grossly intact. Sensation intact, DTR normal. Strength 5/5 in all 4.  Psychiatric: Normal judgment and insight. Alert and oriented x 3. Normal mood.    Labs on Admission: I have personally reviewed following labs and imaging studies  CBC:  Recent Labs Lab 01/13/16 1638  WBC 5.7  NEUTROABS 4.6  HGB 10.0*  HCT 31.3*  MCV 86.5  PLT 170   Basic Metabolic Panel:  Recent Labs Lab 01/13/16 1638 01/13/16 1647  NA 138  --   K 3.5  --   CL 104  --   CO2 27  --   GLUCOSE 90  --   BUN 18  --   CREATININE 1.18*  --   CALCIUM 7.8*  --   MG  --  1.8   GFR: Estimated Creatinine Clearance: 39.1 mL/min (by C-G formula based on SCr of 1.18 mg/dL (H)). Liver Function Tests: No results for input(s): AST, ALT, ALKPHOS, BILITOT, PROT, ALBUMIN in the last 168 hours. No results for input(s): LIPASE, AMYLASE in the last 168 hours. No results for input(s): AMMONIA in the last 168 hours. Coagulation Profile: No results for input(s): INR, PROTIME in the last 168 hours. Cardiac Enzymes:  Recent Labs Lab 01/13/16 1638  TROPONINI 0.05*   BNP (last 3 results) No results for input(s): PROBNP in the last 8760 hours. HbA1C: No results for input(s): HGBA1C in the last 72 hours. CBG: No results for input(s): GLUCAP in the last 168 hours. Lipid Profile: No results for input(s): CHOL, HDL, LDLCALC, TRIG, CHOLHDL, LDLDIRECT in the last 72 hours. Thyroid Function Tests: No results for input(s): TSH, T4TOTAL, FREET4, T3FREE, THYROIDAB in the last 72 hours. Anemia Panel: No results for input(s): VITAMINB12, FOLATE, FERRITIN, TIBC, IRON, RETICCTPCT in the last 72 hours. Urine analysis:    Component Value Date/Time   COLORURINE YELLOW  12/03/2015 1010   APPEARANCEUR CLEAR 12/03/2015 1010   LABSPEC 1.025 12/03/2015 1010   PHURINE 6.0 12/03/2015 1010   GLUCOSEU NEGATIVE 12/03/2015 1010   HGBUR NEGATIVE  12/03/2015 1010   BILIRUBINUR NEGATIVE 12/03/2015 1010   KETONESUR NEGATIVE 12/03/2015 1010   PROTEINUR NEGATIVE 12/03/2015 1010   UROBILINOGEN 0.2 04/13/2014 1200   NITRITE NEGATIVE 12/03/2015 1010   LEUKOCYTESUR NEGATIVE 12/03/2015 1010   Radiological Exams on Admission: Dg Chest 2 View  Result Date: 01/13/2016 CLINICAL DATA:  Chest congestion, soreness from coughing, LEFT side chest pain posteriorly extending to under LEFT breast, recent fall on 12/03/2015 breaking RIGHT ribs, COPD, asthma, hypertension, former smoker EXAM: CHEST  2 VIEW COMPARISON:  12/03/2015 FINDINGS: Enlargement of cardiac silhouette with pulmonary vascular congestion. Atherosclerotic calcification aorta. Peribronchial thickening without definite pulmonary infiltrate, pleural effusion or pneumothorax. Extrapleural density at the lower lateral RIGHT chest suspect related to recent RIGHT rib fractures. Diffuse osseous demineralization with chronic compression fractures at T5 and T11 unchanged. IMPRESSION: Enlargement of cardiac silhouette with pulmonary vascular congestion. Extrapleural density at the lower lateral RIGHT chest likely related to recent RIGHT rib fractures. No definite acute infiltrate. Electronically Signed   By: Ulyses SouthwardMark  Boles M.D.   On: 01/13/2016 11:48  Echocardiogram 08/10/2015  ------------------------------------------------------------------- LV EF: 65% -   70%  ------------------------------------------------------------------- Indications:      SOB (R06.00).  ------------------------------------------------------------------- History:   PMH:  Hyperlipidemia.  Coronary artery disease.  Chronic obstructive pulmonary disease.  Risk factors:   Hypertension.  ------------------------------------------------------------------- Study Conclusions  - Left ventricle: LVEF is vigorous with near cavity obliteration.   The cavity size was normal. Wall thickness was increased in a   pattern of moderate to severe LVH. Systolic function was   vigorous. The estimated ejection fraction was in the range of 65%   to 70%. The study is not technically sufficient to allow   evaluation of LV diastolic function. Doppler parameters are   consistent with high ventricular filling pressure. - Aortic valve: AV is thickened, calcified with minimally   restricted motion. There was mild regurgitation.  EKG: Independently reviewed. Vent. rate 78 BPM PR interval * ms QRS duration 96 ms QT/QTc 395/450 ms P-R-T axes 87 53 256 Sinus rhythm Probable LVH with secondary repol abnrm T wave abnormality Inferior leads Baseline wander  Assessment/Plan Principal Problem:   Acute on chronic diastolic congestive heart failure (HCC) Admit to telemetry/ulceration. Continue supplemental oxygen. Switch furosemide from oral to IVP 40 mg twice a day. Monitor electrolytes and renal function. Continue start him 50 mg by mouth daily. Continue isosorbide mononitrate 15 mg by mouth daily. Daily weights. Monitor intake and output.  Active Problems:   COPD exacerbation (HCC) Continue supplemental oxygen. Continue albuterol as needed be a nebulizer. duo nebs every 6 hours. Continue Solu-Medrol.    Elevated troponin There are no complaints of chest pain or EKG changes. Continue cardiac monitoring and trend troponin level.    Dementia Continue Aricept 10 mg by mouth daily.    Essential hypertension Continue losartan 50 mg by mouth daily. Continue amlodipine 5 mg by mouth daily. Blood pressure monitoring.    CAD (coronary artery disease) Denies chest pain and has normal troponin. Continue pravastatin and isosorbide mononitrate.     Hyperlipidemia Continue pravastatin 40 mg by mouth daily. Interval monitoring a lipid panel and LFTs as outpatient.      Prediabetes Carbohydrate modified diet. CBG monitor and before meals. Regular insulin sliding scale as needed.     DVT prophylaxis: Lovenox SQ. Code Status: DNR/DNI, per . Family Communication: Her daughter Elita Quickam was by bedside. Disposition Plan: Admit for IV diuresis Consults called:  Admission status: Observation/telemetry.   Bobette Moavid Manuel Antonina Deziel  MD [facility] Pager [phone].  If 7PM-7AM, please contact night-coverage [email or web address] Password [email or web address]  01/13/2016, 7:22 PM

## 2016-01-13 NOTE — ED Triage Notes (Signed)
Pt reports was seen at pcp office today and sent over for chest xray. Pt family reports due to chest xray results pt sent over for evaluation.

## 2016-01-13 NOTE — ED Provider Notes (Signed)
[facility]PT Provider Note   CSN: [id number] Arrival date & time: 01/13/16  1523     History   Chief Complaint Chief Complaint  Patient presents with  . Shortness of Breath    HPI [name] is a 80 y.o. female.  The history is provided by the patient, medical records and a relative. The history is limited by the condition of the patient (hx dementia).  Shortness of Breath   Pt was seen at 1615.  Per pt and her family:  c/o gradual onset and worsening of persistent cough, wheezing and SOB for the past 4 to 5 days, worse since yesterday. Pt has been wearing her home O2 without improvement. SOB worsens on exertion. Pt was evaluated by her PMD PTA, was hypoxic (despite wearing O2 N/C) in office; and was sent to the ED for evaluation. Denies CP/palpitations, no back pain, no abd pain, no N/V/D, no fevers, no rash.    Past Medical History:  Diagnosis Date  . Acute on chronic diastolic heart failure (HCC) 04/11/2014  . Asthma   . Bradycardia   . CAD (coronary artery disease) 08/2009   s/p PCI of the LAD  . Carotid artery stenosis   . Chronic headache   . COPD (chronic obstructive pulmonary disease) (HCC)   . Dementia    patient denies this  . Hyperlipidemia   . Hypertension   . Impaired fasting glucose   . Myocardial infarction    Non-st segment elevated  . On home O2   . Osteoporosis 2009  . Pneumonia   . Seasonal allergies   . Ulnar neuropathy     Patient Active Problem List   Diagnosis Date Noted  . Multiple fractures of ribs, right side, initial encounter for closed fracture 12/03/2015  . Rib fractures 12/03/2015  . Chronic respiratory failure (HCC) 12/03/2015  . COPD (chronic obstructive pulmonary disease) (HCC) 12/03/2015  . Senile purpura (HCC) 09/07/2015  . Benign essential tremor 09/07/2015  . Chronic diastolic congestive heart failure (HCC) 04/20/2015  . Renal insufficiency 04/25/2014  . Acute on chronic diastolic heart failure (HCC)  40/98/119102/26/2016  . Acute on chronic respiratory failure (HCC) 04/11/2014  . CKD (chronic kidney disease) stage 3, GFR 30-59 ml/min 04/11/2014  . Prediabetes 12/03/2013  . Hypertrophic obstructive cardiomyopathy (HCC) 11/09/2013  . Hypoxia 08/28/2013  . Respiratory failure with hypoxia (HCC) 05/07/2013  . Acute diastolic CHF (congestive heart failure) (HCC) 05/07/2013  . COPD exacerbation (HCC) 02/15/2013  . Hyperlipidemia 11/09/2010  . Acute diastolic heart failure (HCC) 10/15/2010  . CAD, NATIVE VESSEL 09/18/2009  . BRADYCARDIA 08/19/2009  . Dementia 07/19/2007  . Essential hypertension 07/19/2007  . CAROTID ARTERY STENOSIS 07/19/2007  . ASTHMA 07/19/2007  . COPD with asthma (HCC) 07/19/2007  . HEADACHE, CHRONIC, HX OF 07/19/2007    Past Surgical History:  Procedure Laterality Date  . BLADDER SURGERY  1991  . CARDIAC CATHETERIZATION  11/2010   negative  . CATARACT EXTRACTION  Nov 2016  . LEFT HEART CATHETERIZATION WITH CORONARY ANGIOGRAM N/A 11/05/2012   Procedure: LEFT HEART CATHETERIZATION WITH CORONARY ANGIOGRAM;  Surgeon: Micheline ChapmanMichael D Cooper, MD;  [facility];  Service: Cardiovascular;  Laterality: N/A;  . PARTIAL HYSTERECTOMY  1984  . Radial artery catheter     Bladder    OB History    Gravida Para Term Preterm AB Living             3   SAB TAB Ectopic Multiple Live Births  Home Medications    Prior to Admission medications   Medication Sig Start Date End Date Taking? Authorizing Provider  albuterol (PROVENTIL HFA;VENTOLIN HFA) 108 (90 Base) MCG/ACT inhaler Inhale 2 puffs into the lungs 3 (three) times daily. 09/07/15   Babs Sciara, MD  albuterol (PROVENTIL) (2.5 MG/3ML) 0.083% nebulizer solution Take 3 mLs (2.5 mg total) by nebulization every 4 (four) hours as needed for wheezing or shortness of breath. 10/12/15   Babs Sciara, MD  alendronate (FOSAMAX) 70 MG tablet TAKE 1 TABLET BY MOUTH 1 TIME WEEKLY AS DIRECTED 11/06/15   Babs Sciara,  MD  amLODipine (NORVASC) 5 MG tablet TAKE 1/2 TABLET BY MOUTH DAILY 07/09/15   Babs Sciara, MD  Ascorbic Acid (VITAMIN C PO) Take 1 tablet by mouth daily.    Historical Provider, MD  aspirin 325 MG tablet Take 325 mg by mouth daily.    Historical Provider, MD  budesonide-formoterol (SYMBICORT) 160-4.5 MCG/ACT inhaler Inhale 2 puffs into the lungs 2 (two) times daily. 08/28/13   Babs Sciara, MD  busPIRone (BUSPAR) 7.5 MG tablet Take 1 tablet (7.5 mg total) by mouth 3 (three) times daily. 12/29/15   Babs Sciara, MD  Calcium Carbonate (CALCIUM 600 PO) Take 1 tablet by mouth daily.     Historical Provider, MD  cefPROZIL (CEFZIL) 250 MG tablet Take 1 tablet (250 mg total) by mouth 2 (two) times daily. 01/04/16   Babs Sciara, MD  cetirizine (ZYRTEC) 10 MG tablet Take 10 mg by mouth daily.    Historical Provider, MD  Cholecalciferol (VITAMIN D3) 1000 UNITS CAPS Take 1 capsule by mouth daily.    Historical Provider, MD  donepezil (ARICEPT) 10 MG tablet TAKE 1 TABLET BY MOUTH EVERY NIGHT AT BEDTIME 09/17/15   Babs Sciara, MD  furosemide (LASIX) 20 MG tablet Take 1 tablet (20 mg total) by mouth daily. 12/30/15   Babs Sciara, MD  HYDROcodone-acetaminophen (NORCO/VICODIN) 5-325 MG tablet Take 1-2 tablets by mouth every 4 (four) hours as needed for moderate pain. 12/04/15   Erick Blinks, MD  isosorbide mononitrate (IMDUR) 30 MG 24 hr tablet Take 0.5 tablets (15 mg total) by mouth daily. 08/10/15   Rosalio Macadamia, NP  LORazepam (ATIVAN) 1 MG tablet TAKE 1/2 TABLET TO 1 TABLET BY MOUTH EVERY NIGHT AT BEDTIME AS NEEDED 09/30/15   Merlyn Albert, MD  losartan (COZAAR) 50 MG tablet Take 1 tablet (50 mg total) by mouth daily. 01/06/16   Babs Sciara, MD  nitroGLYCERIN (NITROSTAT) 0.4 MG SL tablet Place 1 tablet (0.4 mg total) under the tongue as needed for chest pain. 12/18/14   Babs Sciara, MD  omeprazole (PRILOSEC) 20 MG capsule Take 20 mg by mouth daily.    Historical Provider, MD  ondansetron  (ZOFRAN) 8 MG tablet Take 1 tablet (8 mg total) by mouth every 8 (eight) hours as needed for nausea. 12/08/15   Babs Sciara, MD  OVER THE COUNTER MEDICATION Gas relief    Historical Provider, MD  polyethylene glycol (MIRALAX / GLYCOLAX) packet Take 17 g by mouth daily as needed for moderate constipation. 04/16/14   Elliot Cousin, MD  pravastatin (PRAVACHOL) 40 MG tablet TAKE 1 TABLET BY MOUTH EVERY DAY 11/06/15   Babs Sciara, MD  predniSONE (DELTASONE) 20 MG tablet 3qd for 2d then 2qd for 2d then 1qd for 2d 01/04/16   Babs Sciara, MD  ranitidine (ZANTAC) 150 MG tablet Take 150 mg by  mouth daily as needed for heartburn.    Historical Provider, MD  sertraline (ZOLOFT) 100 MG tablet TAKE ONE AND ONE HALF(1 1/2) TABLET BY MOUTH DAILY 08/21/15   Babs Sciara, MD  TRAVATAN Z 0.004 % SOLN ophthalmic solution Place 1 drop into the left eye at bedtime.  10/08/14   Historical Provider, MD    Family History Family History  Problem Relation Age of Onset  . Addison's disease Mother   . Alcohol abuse Father     Social History Social History  Substance Use Topics  . Smoking status: Former Smoker    Packs/day: 1.00    Years: 35.00    Quit date: 02/15/2008  . Smokeless tobacco: Never Used  . Alcohol use No     Allergies   Codeine; Dilaudid [hydromorphone hcl]; Erythromycin; and Sulfonamide derivatives   Review of Systems Review of Systems  Respiratory: Positive for shortness of breath.      Physical Exam Updated Vital Signs BP 197/77 (BP Location: Right Arm)   Pulse 80   Temp 98.4 F (36.9 C) (Oral)   Resp 20   Ht 5\' 3"  (1.6 m)   Wt 174 lb (78.9 kg)   SpO2 98%   BMI 30.82 kg/m   Physical Exam 1620: Physical examination:  Nursing notes reviewed; Vital signs and O2 SAT reviewed;  Constitutional: Well developed, Well nourished, Well hydrated, In no acute distress; Head:  Normocephalic, atraumatic; Eyes: EOMI, PERRL, No scleral icterus; ENMT: Mouth and pharynx normal, Mucous  membranes moist; Neck: Supple, Full range of motion, No lymphadenopathy; Cardiovascular: Regular rate and rhythm, No gallop; Respiratory: Breath sounds clear & equal bilaterally, insp/exp wheezes bilat. No audible wheezing.  Speaking short sentences, mild tachypnea.;;Chest: Nontender, Movement normal; Abdomen: Soft, Nontender, Nondistended, Normal bowel sounds; Genitourinary: No CVA tenderness; Extremities: Pulses normal, No tenderness, No edema, No calf edema or asymmetry.; Neuro: Awake, alert, mildly confused per hx dementia. Major CN grossly intact.  Speech clear. No gross focal motor deficits in extremities.; Skin: Color normal, Warm, Dry.   ED Treatments / Results  Labs (all labs ordered are listed, but only abnormal results are displayed)   EKG  EKG Interpretation  Date/Time:  Wednesday January 13 2016 15:34:59 EST Ventricular Rate:  78 PR Interval:    QRS Duration: 96 QT Interval:  395 QTC Calculation: 450 R Axis:   53 Text Interpretation:  Sinus rhythm Probable LVH with secondary repol abnrm T wave abnormality Inferior leads Baseline wander When compared with ECG of 01/05/2015 T wave abnormality Inferior leads is now Present Confirmed by [facility]  MD, Nicholos Johns [phone]) on 01/13/2016 5:06:03 PM       Radiology   Procedures Procedures (including critical care time)  Medications Ordered in ED Medications  methylPREDNISolone sodium succinate (SOLU-MEDROL) 125 mg/2 mL injection 125 mg (125 mg Intravenous Given 01/13/16 1634)  albuterol (PROVENTIL,VENTOLIN) solution continuous neb (10 mg/hr Nebulization Given 01/13/16 1643)  ipratropium (ATROVENT) nebulizer solution 1 mg (1 mg Nebulization Given 01/13/16 1643)     Initial Impression / Assessment and Plan / ED Course  I have reviewed the triage vital signs and the nursing notes.  Pertinent labs & imaging results that were available during my care of the patient were reviewed by me and considered in my medical decision making  (see chart for details).  MDM Reviewed: previous chart, nursing note and vitals Reviewed previous: labs and ECG Interpretation: labs, ECG and x-ray Total time providing critical care: 30-74 minutes. This excludes time spent performing separately  reportable procedures and services. Consults: admitting MD   CRITICAL CARE Performed by: Laray Anger Total critical care time: 35 minutes Critical care time was exclusive of separately billable procedures and treating other patients. Critical care was necessary to treat or prevent imminent or life-threatening deterioration. Critical care was time spent personally by me on the following activities: development of treatment plan with patient and/or surrogate as well as nursing, discussions with consultants, evaluation of patient's response to treatment, examination of patient, obtaining history from patient or surrogate, ordering and performing treatments and interventions, ordering and review of laboratory studies, ordering and review of radiographic studies, pulse oximetry and re-evaluation of patient's condition.   Results for orders placed or performed during the hospital encounter of 01/13/16  Basic metabolic panel  Result Value Ref Range   Sodium 138 135 - 145 mmol/L   Potassium 3.5 3.5 - 5.1 mmol/L   Chloride 104 101 - 111 mmol/L   CO2 27 22 - 32 mmol/L   Glucose, Bld 90 65 - 99 mg/dL   BUN 18 6 - 20 mg/dL   Creatinine, Ser 1.61 (H) 0.44 - 1.00 mg/dL   Calcium 7.8 (L) 8.9 - 10.3 mg/dL   GFR calc non Af Amer 43 (L) >60 mL/min   GFR calc Af Amer 50 (L) >60 mL/min   Anion gap 7 5 - 15  Brain natriuretic peptide  Result Value Ref Range   B Natriuretic Peptide 622.0 (H) 0.0 - 100.0 pg/mL  Troponin I  Result Value Ref Range   Troponin I 0.05 (HH) <0.03 ng/mL  CBC with Differential  Result Value Ref Range   WBC 5.7 4.0 - 10.5 K/uL   RBC 3.62 (L) 3.87 - 5.11 MIL/uL   Hemoglobin 10.0 (L) 12.0 - 15.0 g/dL   HCT 09.6 (L) 04.5 -  46.0 %   MCV 86.5 78.0 - 100.0 fL   MCH 27.6 26.0 - 34.0 pg   MCHC 31.9 30.0 - 36.0 g/dL   RDW 40.9 81.1 - 91.4 %   Platelets 170 150 - 400 K/uL   Neutrophils Relative % 80 %   Neutro Abs 4.6 1.7 - 7.7 K/uL   Lymphocytes Relative 9 %   Lymphs Abs 0.5 (L) 0.7 - 4.0 K/uL   Monocytes Relative 8 %   Monocytes Absolute 0.5 0.1 - 1.0 K/uL   Eosinophils Relative 3 %   Eosinophils Absolute 0.2 0.0 - 0.7 K/uL   Basophils Relative 0 %   Basophils Absolute 0.0 0.0 - 0.1 K/uL  Magnesium  Result Value Ref Range   Magnesium 1.8 1.7 - 2.4 mg/dL   Dg Chest 2 View Result Date: 01/13/2016 CLINICAL DATA:  Chest congestion, soreness from coughing, LEFT side chest pain posteriorly extending to under LEFT breast, recent fall on 12/03/2015 breaking RIGHT ribs, COPD, asthma, hypertension, former smoker EXAM: CHEST  2 VIEW COMPARISON:  12/03/2015 FINDINGS: Enlargement of cardiac silhouette with pulmonary vascular congestion. Atherosclerotic calcification aorta. Peribronchial thickening without definite pulmonary infiltrate, pleural effusion or pneumothorax. Extrapleural density at the lower lateral RIGHT chest suspect related to recent RIGHT rib fractures. Diffuse osseous demineralization with chronic compression fractures at T5 and T11 unchanged. IMPRESSION: Enlargement of cardiac silhouette with pulmonary vascular congestion. Extrapleural density at the lower lateral RIGHT chest likely related to recent RIGHT rib fractures. No definite acute infiltrate. Electronically Signed   By: Ulyses Southward M.D.   On: 01/13/2016 11:48   Results for ELODY, KLEINSASSER (MRN [id number]) as of 01/13/2016 18:55  Ref. Range  06/07/2014 03:21 01/05/2015 18:07 01/13/2016 16:40  B Natriuretic Peptide Latest Ref Range: 0.0 - 100.0 pg/mL 582.0 (H) 348.0 (H) 622.0 (H)    1910:  Troponin elevated with new TWA on EKG; pt denies CP. BNP elevated compared to previous. Pt wheezing on arrival, hx CHF and COPD:  IV solumedrol, hour long neb and IV  lasix ordered. Dx and testing d/w pt and family.  Questions answered.  Verb understanding, agreeable to admit. T/C to [facility]. Robb Matar, case discussed, including:  HPI, pertinent PM/SHx, VS/PE, dx testing, ED course and treatment:  Agreeable to admit, requests to write temporary orders, obtain tele bed to team [facility].     Final Clinical Impressions(s) / ED Diagnoses   Final diagnoses:  None    New Prescriptions New Prescriptions   No medications on file      Samuel Jester, DO 01/16/16 [id number]

## 2016-01-14 DIAGNOSIS — J441 Chronic obstructive pulmonary disease with (acute) exacerbation: Secondary | ICD-10-CM | POA: Diagnosis not present

## 2016-01-14 DIAGNOSIS — Z66 Do not resuscitate: Secondary | ICD-10-CM | POA: Diagnosis present

## 2016-01-14 DIAGNOSIS — M810 Age-related osteoporosis without current pathological fracture: Secondary | ICD-10-CM | POA: Diagnosis present

## 2016-01-14 DIAGNOSIS — Z9981 Dependence on supplemental oxygen: Secondary | ICD-10-CM | POA: Diagnosis not present

## 2016-01-14 DIAGNOSIS — F0390 Unspecified dementia without behavioral disturbance: Secondary | ICD-10-CM | POA: Diagnosis not present

## 2016-01-14 DIAGNOSIS — R748 Abnormal levels of other serum enzymes: Secondary | ICD-10-CM | POA: Diagnosis not present

## 2016-01-14 DIAGNOSIS — Y92019 Unspecified place in single-family (private) house as the place of occurrence of the external cause: Secondary | ICD-10-CM | POA: Diagnosis not present

## 2016-01-14 DIAGNOSIS — J302 Other seasonal allergic rhinitis: Secondary | ICD-10-CM | POA: Diagnosis present

## 2016-01-14 DIAGNOSIS — I252 Old myocardial infarction: Secondary | ICD-10-CM | POA: Diagnosis not present

## 2016-01-14 DIAGNOSIS — I2510 Atherosclerotic heart disease of native coronary artery without angina pectoris: Secondary | ICD-10-CM | POA: Diagnosis present

## 2016-01-14 DIAGNOSIS — I25708 Atherosclerosis of coronary artery bypass graft(s), unspecified, with other forms of angina pectoris: Secondary | ICD-10-CM | POA: Diagnosis not present

## 2016-01-14 DIAGNOSIS — I422 Other hypertrophic cardiomyopathy: Secondary | ICD-10-CM | POA: Diagnosis present

## 2016-01-14 DIAGNOSIS — Z882 Allergy status to sulfonamides status: Secondary | ICD-10-CM | POA: Diagnosis not present

## 2016-01-14 DIAGNOSIS — I110 Hypertensive heart disease with heart failure: Secondary | ICD-10-CM | POA: Diagnosis present

## 2016-01-14 DIAGNOSIS — Y636 Underdosing and nonadministration of necessary drug, medicament or biological substance: Secondary | ICD-10-CM | POA: Diagnosis present

## 2016-01-14 DIAGNOSIS — Z881 Allergy status to other antibiotic agents status: Secondary | ICD-10-CM | POA: Diagnosis not present

## 2016-01-14 DIAGNOSIS — I5033 Acute on chronic diastolic (congestive) heart failure: Secondary | ICD-10-CM | POA: Diagnosis not present

## 2016-01-14 DIAGNOSIS — R0989 Other specified symptoms and signs involving the circulatory and respiratory systems: Secondary | ICD-10-CM | POA: Diagnosis present

## 2016-01-14 DIAGNOSIS — I248 Other forms of acute ischemic heart disease: Secondary | ICD-10-CM | POA: Diagnosis present

## 2016-01-14 DIAGNOSIS — Z9861 Coronary angioplasty status: Secondary | ICD-10-CM | POA: Diagnosis not present

## 2016-01-14 DIAGNOSIS — Z79899 Other long term (current) drug therapy: Secondary | ICD-10-CM | POA: Diagnosis not present

## 2016-01-14 DIAGNOSIS — E785 Hyperlipidemia, unspecified: Secondary | ICD-10-CM | POA: Diagnosis present

## 2016-01-14 DIAGNOSIS — E7439 Other disorders of intestinal carbohydrate absorption: Secondary | ICD-10-CM | POA: Diagnosis present

## 2016-01-14 DIAGNOSIS — T501X6A Underdosing of loop [high-ceiling] diuretics, initial encounter: Secondary | ICD-10-CM | POA: Diagnosis present

## 2016-01-14 DIAGNOSIS — Z87891 Personal history of nicotine dependence: Secondary | ICD-10-CM | POA: Diagnosis not present

## 2016-01-14 DIAGNOSIS — Z885 Allergy status to narcotic agent status: Secondary | ICD-10-CM | POA: Diagnosis not present

## 2016-01-14 DIAGNOSIS — R7303 Prediabetes: Secondary | ICD-10-CM | POA: Diagnosis present

## 2016-01-14 DIAGNOSIS — I472 Ventricular tachycardia: Secondary | ICD-10-CM | POA: Diagnosis present

## 2016-01-14 LAB — COMPREHENSIVE METABOLIC PANEL
ALT: 18 U/L (ref 14–54)
ANION GAP: 9 (ref 5–15)
AST: 20 U/L (ref 15–41)
Albumin: 3.9 g/dL (ref 3.5–5.0)
Alkaline Phosphatase: 56 U/L (ref 38–126)
BUN: 20 mg/dL (ref 6–20)
CHLORIDE: 101 mmol/L (ref 101–111)
CO2: 29 mmol/L (ref 22–32)
Calcium: 8.1 mg/dL — ABNORMAL LOW (ref 8.9–10.3)
Creatinine, Ser: 1.18 mg/dL — ABNORMAL HIGH (ref 0.44–1.00)
GFR, EST AFRICAN AMERICAN: 50 mL/min — AB (ref >60)
GFR, EST NON AFRICAN AMERICAN: 43 mL/min — AB (ref >60)
Glucose, Bld: 175 mg/dL — ABNORMAL HIGH (ref 65–99)
POTASSIUM: 5 mmol/L (ref 3.5–5.1)
SODIUM: 139 mmol/L (ref 135–145)
Total Bilirubin: 0.4 mg/dL (ref 0.3–1.2)
Total Protein: 6.9 g/dL (ref 6.5–8.1)

## 2016-01-14 LAB — CBC
HEMATOCRIT: 31.1 % — AB (ref 36.0–46.0)
HEMOGLOBIN: 10 g/dL — AB (ref 12.0–15.0)
MCH: 27.9 pg (ref 26.0–34.0)
MCHC: 32.2 g/dL (ref 30.0–36.0)
MCV: 86.6 fL (ref 78.0–100.0)
Platelets: 174 10*3/uL (ref 150–400)
RBC: 3.59 MIL/uL — AB (ref 3.87–5.11)
RDW: 14.1 % (ref 11.5–15.5)
WBC: 5.8 10*3/uL (ref 4.0–10.5)

## 2016-01-14 LAB — TROPONIN I: TROPONIN I: 0.05 ng/mL — AB (ref <0.03)

## 2016-01-14 LAB — MAGNESIUM: Magnesium: 2.4 mg/dL (ref 1.7–2.4)

## 2016-01-14 MED ORDER — IPRATROPIUM-ALBUTEROL 0.5-2.5 (3) MG/3ML IN SOLN: 3.0000 mL | Freq: Four times a day (QID) | RESPIRATORY_TRACT | Status: DC

## 2016-01-14 MED ORDER — IPRATROPIUM BROMIDE 0.02 % IN SOLN
0.5000 mg | Freq: Four times a day (QID) | RESPIRATORY_TRACT | Status: DC
  Administered 2016-01-14 – 2016-01-15 (×5): 0.5 mg via RESPIRATORY_TRACT
  Filled 2016-01-14 (×5): qty 2.5

## 2016-01-14 MED ORDER — LOSARTAN POTASSIUM 50 MG PO TABS
25.0000 mg | ORAL_TABLET | Freq: Every day | ORAL | Status: DC
  Administered 2016-01-15: 25 mg via ORAL
  Filled 2016-01-14: qty 1

## 2016-01-14 MED ORDER — METOPROLOL TARTRATE 25 MG PO TABS
25.0000 mg | ORAL_TABLET | Freq: Two times a day (BID) | ORAL | Status: DC
  Administered 2016-01-14 – 2016-01-15 (×3): 25 mg via ORAL
  Filled 2016-01-14 (×3): qty 1

## 2016-01-14 MED ORDER — LEVALBUTEROL HCL 0.63 MG/3ML IN NEBU: 0.6300 mg | INHALATION_SOLUTION | Freq: Four times a day (QID) | RESPIRATORY_TRACT | Status: DC | PRN

## 2016-01-14 MED ORDER — GUAIFENESIN-DM 100-10 MG/5ML PO SYRP
5.0000 mL | ORAL_SOLUTION | ORAL | Status: DC | PRN
  Administered 2016-01-14: 5 mL via ORAL
  Filled 2016-01-14 (×2): qty 5

## 2016-01-14 MED ORDER — LATANOPROST 0.005 % OP SOLN
OPHTHALMIC | Status: AC
  Filled 2016-01-14: qty 2.5

## 2016-01-14 MED ORDER — IPRATROPIUM-ALBUTEROL 0.5-2.5 (3) MG/3ML IN SOLN
3.0000 mL | Freq: Three times a day (TID) | RESPIRATORY_TRACT | Status: DC
  Administered 2016-01-14: 3 mL via RESPIRATORY_TRACT
  Filled 2016-01-14: qty 3

## 2016-01-14 MED ORDER — FUROSEMIDE 40 MG PO TABS
40.0000 mg | ORAL_TABLET | Freq: Every day | ORAL | Status: DC
  Administered 2016-01-15: 40 mg via ORAL
  Filled 2016-01-14: qty 1

## 2016-01-14 NOTE — Progress Notes (Signed)
Patient had a 13 beat run of v-tach, patient stable at the time and was on the phone. Dr. Sharl Ma notified, orders received.

## 2016-01-14 NOTE — Progress Notes (Signed)
[facility]  PROGRESS NOTE  [name] WUJ:[id number] DOB: Oct 11, 1935 DOA: 01/13/2016 PCP: Lilyan PuntScott Luking, MD   Brief HPI:    80 y.o. female with medical history significant of chronic diastolic heart failure, asthma/COPD, bradycardia, CAD, carotid artery stenosis, chronic headache, dementia, hyperlipidemia, hypertension glucose intolerance, osteoporosis, seasonal allergies, pneumonia, ulnar neuropathy who is referred by her PCP's due to shortness of breath office to follow up for recent case of Parainfluenza virus and was noticed to have an O2 saturation of 80% after going from the waiting area to the exam room at the clinic while on 4 LPM of nasal canula oxygen. Her O2 saturation increased to 94% after rest.   Subjective   This morning patient is breathing better.   Assessment/Plan:     1. Acute on chronic diastolic CHF- will change Lasix to by mouth today as patient has improved. We'll start Lasix 40 mg by mouth daily from tomorrow morning. 2. COPD exacerbation- continue ipratropium nebulizer every 6 hours, Xopenex nebulizers every 6 hours when necessary. Solu-Medrol 60 mg IV every 6 hours. 3. Nonsustained V. Tach- patient had an episode of 13 beat NSVT, will check serum magnesium. Change albuterol to Xopenex. Will continue to monitor on telemetry. 4. Mild elevation of troponin- no significant EKG changes, no chest pain. Troponin 0.05. We'll continue to monitor.     DVT prophylaxis: Lovenox  Code Status: DO NOT RESUSCITATE  Family Communication: No family at bedside   Disposition Plan: Home when medically stable   Consultants:  None  Procedures:  None  Continuous infusions  none    Antibiotics:   Anti-infectives    Start     Dose/Rate Route Frequency Ordered Stop   01/13/16 2245  cefUROXime (CEFTIN) tablet 500 mg     500 mg Oral 2 times daily 01/13/16 2237         Objective   Vitals:   01/13/16 2100 01/13/16 2307 01/14/16 0452 01/14/16 0738   BP: 164/59 (!) 166/50 (!) 166/67   Pulse: 89 90 88   Resp: 25 (!) 21 20   Temp:  98.5 F (36.9 C) 98.4 F (36.9 C)   TempSrc:  Oral Oral   SpO2: 95% 98% 99% 97%  Weight:   78.6 kg (173 lb 4.8 oz)   Height:        Intake/Output Summary (Last 24 hours) at 01/14/16 1441 Last data filed at 01/14/16 0600  Gross per 24 hour  Intake              285 ml  Output              350 ml  Net              -65 ml   Filed Weights   01/13/16 1537 01/14/16 0452  Weight: 78.9 kg (174 lb) 78.6 kg (173 lb 4.8 oz)     Physical Examination:  General exam: Appears calm and comfortable. Respiratory system: Scattered wheezing bilaterally. Cardiovascular system:  RRR. No  murmurs, rubs, gallops. No pedal edema. GI system: Abdomen is nondistended, soft and nontender. No organomegaly.  Central nervous system. No focal neurological deficits. 5 x 5 power in all extremities. Skin: No rashes, lesions or ulcers. Psychiatry: Alert, oriented x 3.Judgement and insight appear normal. Affect normal.    Data Reviewed: I have personally reviewed following labs and imaging studies  CBG: No results for input(s): GLUCAP in the last 168 hours.  CBC:  Recent Labs Lab 01/13/16 1638 01/14/16  0443  WBC 5.7 5.8  NEUTROABS 4.6  --   HGB 10.0* 10.0*  HCT 31.3* 31.1*  MCV 86.5 86.6  PLT 170 174    Basic Metabolic Panel:  Recent Labs Lab 01/13/16 1638 01/13/16 1647 01/13/16 1648 01/14/16 0443  NA 138  --   --  139  K 3.5  --   --  5.0  CL 104  --   --  101  CO2 27  --   --  29  GLUCOSE 90  --   --  175*  BUN 18  --   --  20  CREATININE 1.18*  --   --  1.18*  CALCIUM 7.8*  --   --  8.1*  MG  --  1.8  --  2.4  PHOS  --   --  2.5  --     No results found for this or any previous visit (from the past 240 hour(s)).   Liver Function Tests:  Recent Labs Lab 01/14/16 0443  AST 20  ALT 18  ALKPHOS 56  BILITOT 0.4  PROT 6.9  ALBUMIN 3.9   No results for input(s): LIPASE, AMYLASE in the  last 168 hours. No results for input(s): AMMONIA in the last 168 hours.  Cardiac Enzymes:  Recent Labs Lab 01/13/16 1638 01/13/16 2226 01/14/16 0443  TROPONINI 0.05* 0.05* 0.05*   BNP (last 3 results)  Recent Labs  01/13/16 1640  BNP 622.0*    ProBNP (last 3 results) No results for input(s): PROBNP in the last 8760 hours.    Studies: Dg Chest 2 View  Result Date: 01/13/2016 CLINICAL DATA:  Chest congestion, soreness from coughing, LEFT side chest pain posteriorly extending to under LEFT breast, recent fall on 12/03/2015 breaking RIGHT ribs, COPD, asthma, hypertension, former smoker EXAM: CHEST  2 VIEW COMPARISON:  12/03/2015 FINDINGS: Enlargement of cardiac silhouette with pulmonary vascular congestion. Atherosclerotic calcification aorta. Peribronchial thickening without definite pulmonary infiltrate, pleural effusion or pneumothorax. Extrapleural density at the lower lateral RIGHT chest suspect related to recent RIGHT rib fractures. Diffuse osseous demineralization with chronic compression fractures at T5 and T11 unchanged. IMPRESSION: Enlargement of cardiac silhouette with pulmonary vascular congestion. Extrapleural density at the lower lateral RIGHT chest likely related to recent RIGHT rib fractures. No definite acute infiltrate. Electronically Signed   By: Ulyses Southward M.D.   On: 01/13/2016 11:48    Scheduled Meds: . amLODipine  2.5 mg Oral Daily  . cefUROXime  500 mg Oral BID  . cholecalciferol  1,000 Units Oral Daily  . donepezil  10 mg Oral QHS  . enoxaparin (LOVENOX) injection  40 mg Subcutaneous Q24H  . famotidine  20 mg Oral BID  . ipratropium  0.5 mg Nebulization Q6H  . isosorbide mononitrate  15 mg Oral Daily  . latanoprost  1 drop Left Eye QHS  . loratadine  10 mg Oral Daily  . [START ON 01/15/2016] losartan  25 mg Oral Daily  . mouth rinse  15 mL Mouth Rinse BID  . methylPREDNISolone (SOLU-MEDROL) injection  40 mg Intravenous Q6H  . metoprolol tartrate  25  mg Oral BID  . pantoprazole  40 mg Oral Daily  . pravastatin  40 mg Oral Daily  . sertraline  150 mg Oral Daily      Time spent: 25 min  [facility]   [facility] Pager [phone]. If 7PM-7AM, please contact night-coverage at [email or web address], Office  [phone]  password [email or web address] 01/14/2016, 2:41 PM  LOS: 0 days

## 2016-01-14 NOTE — Care Management Note (Signed)
Case Management Note  Patient Details  Name: [name] MRN: [id number] Date of Birth: 1935-10-04  Subjective/Objective:      Patient adm from home with CHF. She lives with husband, has cane and walker. Has home O2 PTA. Believes she is active with [facility] RN for services but hasn't had first visit yet. She has a PCP, transportation, and inusrnce with prescription coverage.             Action/Plan: Anticipate DC home with resumption of HH services.   Expected Discharge Date:       01/15/2016           Expected Discharge Plan:  Home/Self Care  In-House Referral:  NA  Discharge planning Services  CM Consult  Post Acute Care Choice:    Choice offered to:     DME Arranged:    DME Agency:     HH Arranged:    HH Agency:     Status of Service:  In process, will continue to follow  If discussed at Long Length of Stay Meetings, dates discussed:    Additional Comments:  Leeland Lovelady, [name], RN 01/14/2016, 2:48 PM

## 2016-01-14 NOTE — Care Management Obs Status (Signed)
MEDICARE OBSERVATION STATUS NOTIFICATION   Patient Details  Name: [name] MRN: [id number] Date of Birth: [birth year]   Medicare Observation Status Notification Given:  Yes    Aiken Withem, [name], RN 01/14/2016, 2:51 PM

## 2016-01-15 DIAGNOSIS — J441 Chronic obstructive pulmonary disease with (acute) exacerbation: Secondary | ICD-10-CM

## 2016-01-15 DIAGNOSIS — I5033 Acute on chronic diastolic (congestive) heart failure: Secondary | ICD-10-CM

## 2016-01-15 DIAGNOSIS — I25708 Atherosclerosis of coronary artery bypass graft(s), unspecified, with other forms of angina pectoris: Secondary | ICD-10-CM

## 2016-01-15 DIAGNOSIS — R748 Abnormal levels of other serum enzymes: Secondary | ICD-10-CM

## 2016-01-15 DIAGNOSIS — I10 Essential (primary) hypertension: Secondary | ICD-10-CM

## 2016-01-15 DIAGNOSIS — F0390 Unspecified dementia without behavioral disturbance: Secondary | ICD-10-CM

## 2016-01-15 DIAGNOSIS — I472 Ventricular tachycardia: Secondary | ICD-10-CM

## 2016-01-15 DIAGNOSIS — E7800 Pure hypercholesterolemia, unspecified: Secondary | ICD-10-CM

## 2016-01-15 LAB — BASIC METABOLIC PANEL
Anion gap: 7 (ref 5–15)
BUN: 29 mg/dL — AB (ref 6–20)
CHLORIDE: 102 mmol/L (ref 101–111)
CO2: 28 mmol/L (ref 22–32)
CREATININE: 1.23 mg/dL — AB (ref 0.44–1.00)
Calcium: 8 mg/dL — ABNORMAL LOW (ref 8.9–10.3)
GFR calc Af Amer: 47 mL/min — ABNORMAL LOW (ref >60)
GFR calc non Af Amer: 41 mL/min — ABNORMAL LOW (ref >60)
Glucose, Bld: 165 mg/dL — ABNORMAL HIGH (ref 65–99)
Potassium: 4.4 mmol/L (ref 3.5–5.1)
Sodium: 137 mmol/L (ref 135–145)

## 2016-01-15 MED ORDER — IPRATROPIUM BROMIDE 0.02 % IN SOLN: 0.5000 mg | Freq: Four times a day (QID) | RESPIRATORY_TRACT | 12 refills | Status: DC | PRN

## 2016-01-15 MED ORDER — LOSARTAN POTASSIUM 25 MG PO TABS: 25.0000 mg | ORAL_TABLET | Freq: Every day | ORAL | 2 refills | Status: DC

## 2016-01-15 MED ORDER — AMLODIPINE BESYLATE 5 MG PO TABS: 5.0000 mg | ORAL_TABLET | Freq: Every day | ORAL | Status: DC

## 2016-01-15 MED ORDER — METOPROLOL TARTRATE 25 MG PO TABS: 25.0000 mg | ORAL_TABLET | Freq: Two times a day (BID) | ORAL | 2 refills | Status: DC

## 2016-01-15 MED ORDER — PREDNISONE 10 MG PO TABS: ORAL_TABLET | ORAL | 0 refills | Status: DC

## 2016-01-15 NOTE — Discharge Summary (Signed)
Physician Discharge Summary  Heather KayMargie K Cummings ZOX:[id number] DOB: 10-08-1935 DOA: 01/13/2016  PCP: Lilyan PuntScott Luking, MD  Admit date: 01/13/2016 Discharge date: 01/15/2016  Time spent: 25* minutes  Recommendations for Outpatient Follow-up:  1. Follow-up cardiology in 3 weeks 2. Follow-up PCP in 2 weeks   Discharge Diagnoses:  Principal Problem:   Acute on chronic diastolic congestive heart failure (HCC) Active Problems:   Dementia   Essential hypertension   CAD (coronary artery disease)   Hyperlipidemia   COPD exacerbation (HCC)   Prediabetes   Elevated troponin   Discharge Condition: Stable  Diet recommendation: Low-salt diet  Filed Weights   01/13/16 1537 01/14/16 0452 01/15/16 0541  Weight: 78.9 kg (174 lb) 78.6 kg (173 lb 4.8 oz) 78 kg (172 lb)    History of present illness:  80 y.o.femalewith medical history significant of chronic diastolic heart failure, asthma/COPD, bradycardia, CAD, carotid artery stenosis, chronic headache, dementia, hyperlipidemia, hypertension glucose intolerance, osteoporosis, seasonal allergies, pneumonia, ulnarneuropathy who is referred by her PCP's due to shortness of breath office to follow up for recent case of Parainfluenza virus and was noticed to have an O2 saturation of 80% after going from the waiting area to the exam room at the clinic while on 4 LPM of nasal canula oxygen. Her O2 saturation increased to 94% after rest.   Hospital Course:  1. Acute on chronic diastolic CHF- patient improved after giving IV Lasix, Lasix changed to 20 mg by mouth daily 2. COPD exacerbation-  improved with soluble Medrol, DuoNeb's. Will discharge on prednisone taper and DuoNeb's every 6 hours when necessary 3. Nonsustained V. Tach- patient had an episode of 13 beat NSVT, serum magnesium and potassium were within normal limits, cardiology was consulted and no further intervention recommended at this time. Patient has been started on metoprolol 25 mg twice a  day. She will follow-up with cardiology as outpatient in 3-4 weeks. 4. Mild elevation of troponin- no significant EKG changes, no chest pain. Troponin 0.05.  likely from demand ischemia from CHF 5. Hypertension-continue Cozaar, amlodipine, also started on metoprolol 25 mg twice a day. Dose of Cozaar has been reduced to 25 mg daily.   Procedures:  None  Consultations:  Cardiology  Discharge Exam: Vitals:   01/15/16 0541 01/15/16 0952  BP: (!) 172/60 (!) 162/62  Pulse: 71 86  Resp: 18   Temp: 97.8 F (36.6 C)     General: *Appears in no acute distress Cardiovascular: RRR, S1-S2 Respiratory: Scattered wheezing bilaterally  Discharge Instructions   Discharge Instructions    Diet - low sodium heart healthy    Complete by:  As directed    Increase activity slowly    Complete by:  As directed      Current Discharge Medication List    START taking these medications   Details  ipratropium (ATROVENT) 0.02 % nebulizer solution Take 2.5 mLs (0.5 mg total) by nebulization every 6 (six) hours as needed for wheezing or shortness of breath. Qty: 75 mL, Refills: 12    metoprolol tartrate (LOPRESSOR) 25 MG tablet Take 1 tablet (25 mg total) by mouth 2 (two) times daily. Qty: 60 tablet, Refills: 2      CONTINUE these medications which have CHANGED   Details  losartan (COZAAR) 25 MG tablet Take 1 tablet (25 mg total) by mouth daily. Qty: 30 tablet, Refills: 2    predniSONE (DELTASONE) 10 MG tablet Prednisone 40 mg po daily x 1 day then Prednisone 30 mg po daily x 1 day  then Prednisone 20 mg po daily x 1 day then Prednisone 10 mg daily x 1 day then stop... Qty: 10 tablet, Refills: 0      CONTINUE these medications which have NOT CHANGED   Details  albuterol (PROVENTIL HFA;VENTOLIN HFA) 108 (90 Base) MCG/ACT inhaler Inhale 2 puffs into the lungs 3 (three) times daily. Qty: 1 Inhaler, Refills: 3    albuterol (PROVENTIL) (2.5 MG/3ML) 0.083% nebulizer solution Take 3 mLs (2.5  mg total) by nebulization every 4 (four) hours as needed for wheezing or shortness of breath. Qty: 75 mL, Refills: 2    amLODipine (NORVASC) 5 MG tablet TAKE 1/2 TABLET BY MOUTH DAILY Qty: 45 tablet, Refills: 5    Ascorbic Acid (VITAMIN C PO) Take 1 tablet by mouth daily.    budesonide-formoterol (SYMBICORT) 160-4.5 MCG/ACT inhaler Inhale 2 puffs into the lungs 2 (two) times daily. Qty: 1 Inhaler, Refills: 12    Calcium Carbonate (CALCIUM 600 PO) Take 1 tablet by mouth daily.     cetirizine (ZYRTEC) 10 MG tablet Take 10 mg by mouth daily.    Cholecalciferol (VITAMIN D3) 1000 UNITS CAPS Take 1 capsule by mouth daily.    donepezil (ARICEPT) 10 MG tablet TAKE 1 TABLET BY MOUTH EVERY NIGHT AT BEDTIME Qty: 90 tablet, Refills: 0    furosemide (LASIX) 20 MG tablet Take 1 tablet (20 mg total) by mouth daily. Qty: 30 tablet, Refills: 3    HYDROcodone-acetaminophen (NORCO/VICODIN) 5-325 MG tablet Take 1-2 tablets by mouth every 4 (four) hours as needed for moderate pain. Qty: 30 tablet, Refills: 0    isosorbide mononitrate (IMDUR) 30 MG 24 hr tablet Take 0.5 tablets (15 mg total) by mouth daily. Qty: 30 tablet, Refills: 3    LORazepam (ATIVAN) 1 MG tablet TAKE 1/2 TABLET TO 1 TABLET BY MOUTH EVERY NIGHT AT BEDTIME AS NEEDED Qty: 30 tablet, Refills: 5    nitroGLYCERIN (NITROSTAT) 0.4 MG SL tablet Place 1 tablet (0.4 mg total) under the tongue as needed for chest pain. Qty: 25 tablet, Refills: 0    omeprazole (PRILOSEC) 20 MG capsule Take 20 mg by mouth daily.    ondansetron (ZOFRAN) 8 MG tablet Take 1 tablet (8 mg total) by mouth every 8 (eight) hours as needed for nausea. Qty: 20 tablet, Refills: 6    polyethylene glycol (MIRALAX / GLYCOLAX) packet Take 17 g by mouth daily as needed for moderate constipation.    pravastatin (PRAVACHOL) 40 MG tablet TAKE 1 TABLET BY MOUTH EVERY DAY Qty: 90 tablet, Refills: 0    ranitidine (ZANTAC) 150 MG tablet Take 150 mg by mouth daily as needed  for heartburn.    sertraline (ZOLOFT) 100 MG tablet TAKE ONE AND ONE HALF(1 1/2) TABLET BY MOUTH DAILY Qty: 225 tablet, Refills: 0    Simethicone (GAS RELIEF PO) Take 1 tablet by mouth daily as needed (gas relief).    TRAVATAN Z 0.004 % SOLN ophthalmic solution Place 1 drop into the left eye at bedtime.     alendronate (FOSAMAX) 70 MG tablet TAKE 1 TABLET BY MOUTH 1 TIME WEEKLY AS DIRECTED Qty: 12 tablet, Refills: 0      STOP taking these medications     cefPROZIL (CEFZIL) 250 MG tablet        Allergies  Allergen Reactions  . Codeine Nausea And Vomiting  . Dilaudid [Hydromorphone Hcl] Other (See Comments)    Extremely sensitive  . Erythromycin Other (See Comments)    Intense stomach pain  . Sulfonamide Derivatives  unknown      The results of significant diagnostics from this hospitalization (including imaging, microbiology, ancillary and laboratory) are listed below for reference.    Significant Diagnostic Studies: Dg Chest 2 View  Result Date: 01/13/2016 CLINICAL DATA:  Chest congestion, soreness from coughing, LEFT side chest pain posteriorly extending to under LEFT breast, recent fall on 12/03/2015 breaking RIGHT ribs, COPD, asthma, hypertension, former smoker EXAM: CHEST  2 VIEW COMPARISON:  12/03/2015 FINDINGS: Enlargement of cardiac silhouette with pulmonary vascular congestion. Atherosclerotic calcification aorta. Peribronchial thickening without definite pulmonary infiltrate, pleural effusion or pneumothorax. Extrapleural density at the lower lateral RIGHT chest suspect related to recent RIGHT rib fractures. Diffuse osseous demineralization with chronic compression fractures at T5 and T11 unchanged. IMPRESSION: Enlargement of cardiac silhouette with pulmonary vascular congestion. Extrapleural density at the lower lateral RIGHT chest likely related to recent RIGHT rib fractures. No definite acute infiltrate. Electronically Signed   By: Ulyses Southward M.D.   On:  01/13/2016 11:48   Dg Hip Unilat  With Pelvis 2-3 Views Right  Result Date: 12/16/2015 CLINICAL DATA:  Patient's slipped and fell today and now complains of right hip pain. EXAM: DG HIP (WITH OR WITHOUT PELVIS) 2-3V RIGHT COMPARISON:  Pelvis and right hip of December 03, 2015 FINDINGS: The bones are subjectively osteopenic. The pelvis exhibits no acute fracture or lytic or blastic lesion. AP and lateral views of the right hip reveal preservation of the joint space. The articular surfaces of the femoral head and acetabulum remains smoothly rounded. The femoral neck, intertrochanteric, and subtrochanteric regions are normal. The soft tissues of the pelvis exhibit no acute abnormalities. IMPRESSION: There is no acute or significant chronic bony abnormality of the right hip. Electronically Signed   By: David  [place] M.D.   On: 12/16/2015 17:07    Microbiology: No results found for this or any previous visit (from the past 240 hour(s)).   Labs: Basic Metabolic Panel:  Recent Labs Lab 01/13/16 1638 01/13/16 1647 01/13/16 1648 01/14/16 0443 01/15/16 0512  NA 138  --   --  139 137  K 3.5  --   --  5.0 4.4  CL 104  --   --  101 102  CO2 27  --   --  29 28  GLUCOSE 90  --   --  175* 165*  BUN 18  --   --  20 29*  CREATININE 1.18*  --   --  1.18* 1.23*  CALCIUM 7.8*  --   --  8.1* 8.0*  MG  --  1.8  --  2.4  --   PHOS  --   --  2.5  --   --    Liver Function Tests:  Recent Labs Lab 01/14/16 0443  AST 20  ALT 18  ALKPHOS 56  BILITOT 0.4  PROT 6.9  ALBUMIN 3.9   No results for input(s): LIPASE, AMYLASE in the last 168 hours. No results for input(s): AMMONIA in the last 168 hours. CBC:  Recent Labs Lab 01/13/16 1638 01/14/16 0443  WBC 5.7 5.8  NEUTROABS 4.6  --   HGB 10.0* 10.0*  HCT 31.3* 31.1*  MCV 86.5 86.6  PLT 170 174   Cardiac Enzymes:  Recent Labs Lab 01/13/16 1638 01/13/16 2226 01/14/16 0443  TROPONINI 0.05* 0.05* 0.05*   BNP: BNP (last 3  results)  Recent Labs  01/13/16 1640  BNP 622.0*      Signed:  Mauro Kaufmann S MD.  [facility] 01/15/2016, 2:29  PM   

## 2016-01-15 NOTE — Progress Notes (Signed)
Patient discharged home.  IV removed - WNL.  Reviewed DC instructions and medications. Instructed to follow up with PCP and cardio.  Patient able to verbalize knowledge of HF management at home.  Understanding verbalized of instructions.  Now questions at this time.  Assisted off unit in NAD.

## 2016-01-15 NOTE — Consult Note (Signed)
CARDIOLOGY CONSULT NOTE   Patient ID: LETA Cummings MRN: [id number] DOB/AGE: [birth year] 80 y.o.  Admit Date: 01/13/2016 Referring Physician: Gean Birchwood MD Primary Physician: Lilyan Punt, MD Consulting Cardiologist: Prentice Docker MD Primary Cardiologist: Tonny Bollman MD  Reason for Consultation: NSVT  Clinical Summary [name] is a 80 y.o.female with known history of chronic diastolic CHF, CAD s/p PCI of the LAD in 2011 hypertension, COPD and hypercholesterolemia. Multiple other medical issues.  She was admitted from PCP office after being seen on follow up for Flu, and was found to have hypoxia per oxygen saturation, while on O2 via [place]. PCP had made some adjustment in diuretic due to worsening renal function. She also complained of productive yellow sputum production.   She was found to be hypertensive on arrival to ER. 197/77, tachycardic, with O2 sat of 99%.Creatinine 1.18, Potassium 3.5. Troponin 0.05. Hgb 10.0/Hct 31.3. She was treated with IV steroids and albuterol neb tx and 40 mg IV lasix. EKG revealed NSR rate of 74 bpm. CXR demostrated pulmonary vascular congestion. No acute infiltrate.   She was admitted with A/C diastolic CHF, COPD exacerbation, hypertension. She was to be discharged today but had two episodes of NSVT this am. She has been placed on metoprolol 25 mg BID and continued on po lasix. . Review of home medications demonstrates that she is on po lasix 20 mg daily, but not on BB. She remains on IV solumedrol, and Atrovent neb solution.   She states that she often has rapid heart rhythm at home. She is gotten used to it over the years. She states usually when she sits down to rest her heart rate normalizes. She states this is nothing new for her. She has chronic shortness of breath, and is oxygen dependent, denies any chest pain dizziness or worsening shortness of breath with rapid heart rhythm.  She admits to not taking care of herself as well as she  should or eating properly.. She states that she has a husband who is recently been diagnosed with melanoma and is having radiation treatments at [facility]. She states she's been focusing on his health.   Allergies  Allergen Reactions  . Codeine Nausea And Vomiting  . Dilaudid [Hydromorphone Hcl] Other (See Comments)    Extremely sensitive  . Erythromycin Other (See Comments)    Intense stomach pain  . Sulfonamide Derivatives     unknown    Medications Scheduled Medications: . amLODipine  2.5 mg Oral Daily  . cefUROXime  500 mg Oral BID  . cholecalciferol  1,000 Units Oral Daily  . donepezil  10 mg Oral QHS  . enoxaparin (LOVENOX) injection  40 mg Subcutaneous Q24H  . famotidine  20 mg Oral BID  . furosemide  40 mg Oral Daily  . ipratropium  0.5 mg Nebulization Q6H  . isosorbide mononitrate  15 mg Oral Daily  . latanoprost  1 drop Left Eye QHS  . loratadine  10 mg Oral Daily  . losartan  25 mg Oral Daily  . mouth rinse  15 mL Mouth Rinse BID  . methylPREDNISolone (SOLU-MEDROL) injection  40 mg Intravenous Q6H  . metoprolol tartrate  25 mg Oral BID  . pantoprazole  40 mg Oral Daily  . pravastatin  40 mg Oral Daily  . sertraline  150 mg Oral Daily      PRN Medications:  guaiFENesin-dextromethorphan, HYDROcodone-acetaminophen, levalbuterol, LORazepam, nitroGLYCERIN, ondansetron **OR** ondansetron (ZOFRAN) IV, ondansetron, polyethylene glycol, simethicone   Past Medical History:  Diagnosis  Date  . Acute on chronic diastolic heart failure (HCC) 04/11/2014  . Asthma   . Bradycardia   . CAD (coronary artery disease) 08/2009   s/p PCI of the LAD  . Carotid artery stenosis   . Chronic headache   . COPD (chronic obstructive pulmonary disease) (HCC)   . Dementia    patient denies this  . Hyperlipidemia   . Hypertension   . Impaired fasting glucose   . Myocardial infarction    Non-st segment elevated  . On home O2   . Osteoporosis 2009  . Pneumonia   . Seasonal  allergies   . Ulnar neuropathy     Past Surgical History:  Procedure Laterality Date  . BLADDER SURGERY  1991  . CARDIAC CATHETERIZATION  11/2010   negative  . CATARACT EXTRACTION  Nov 2016  . LEFT HEART CATHETERIZATION WITH CORONARY ANGIOGRAM N/A 11/05/2012   Procedure: LEFT HEART CATHETERIZATION WITH CORONARY ANGIOGRAM;  Surgeon: Micheline Chapman, MD;  Location: [facility] CATH LAB;  Service: Cardiovascular;  Laterality: N/A;  . PARTIAL HYSTERECTOMY  1984  . Radial artery catheter     Bladder    Family History  Problem Relation Age of Onset  . Addison's disease Mother   . Alcohol abuse Father      Social History [name] reports that she quit smoking about 7 years ago. She has a 35.00 pack-year smoking history. She has never used smokeless tobacco. [name] reports that she does not drink alcohol.  Review of Systems Complete review of systems are found to be negative unless outlined in H&P above.  Physical Examination Blood pressure (!) 162/62, pulse 86, temperature 97.8 F (36.6 C), temperature source Oral, resp. rate 18, height 5\' 3"  (1.6 m), weight 172 lb (78 kg), SpO2 94 %.  Intake/Output Summary (Last 24 hours) at 01/15/16 1012 Last data filed at 01/15/16 0900  Gross per 24 hour  Intake              120 ml  Output              850 ml  Net             -730 ml    Telemetry: NSR with two episodes of NSVT .   GEN:No acute distress, anxious to return home. HEENT: Conjunctiva and lids normal, oropharynx clear with moist mucosa. Neck: Supple, no elevated JVP or carotid bruits, no thyromegaly. Lungs: Inspiratory wheezes with bibasilar crackles no coughing. She is on room air Cardiac: Regular rate and rhythm, no S3 or significant systolic murmur, no pericardial rub. Abdomen: Soft, obese, slight distention, nontender, no hepatomegaly, bowel sounds present, no guarding or rebound. Extremities: No pitting edema, distal pulses 2+. Skin: Warm and dry. Musculoskeletal: No  kyphosis. Neuropsychiatric: Alert and oriented x3, affect grossly appropriate.  Prior Cardiac Testing/Procedures 1.Cardiac Cath 11/05/2012 Coronary angiography: Coronary dominance: right Left mainstem: The left main is short. The vessel is widely patent and arises from the left cusp. It divides into the LAD and left circumflex. Left anterior descending (LAD): The LAD has minor irregularity. The vessel is patent to the left ventricular apex. The stented segment mid LAD is widely patent without significant in-stent restenosis. There is no obstructive disease throughout the distribution of the LAD. There are 3 patent diagonal branches without obstructive disease. Left circumflex (LCx): The left circumflex is a large, dominant vessel. There is diffuse luminal irregularity. There were 2 obtuse marginal branches, a left posterolateral branch, and the left  PDA. Right coronary artery (RCA): The RCA is nondominant. The vessel is moderately calcified. There is mild proximal 20-30% stenosis. Left ventriculography: Left ventricular systolic function is vigorous, with an estimated LVEF of 65-70%. There is marked hypertrophy of the left ventricular apex with obliteration of the distal left ventricular cavity.   Final Conclusions:   1. Continued patency of the stented segment in the LAD 2. Widely patent, dominant left circumflex 3. Probable apical hypertrophic cardiomyopathy  2. Echocardiogram 08/10/2015 Left ventricle: LVEF is vigorous with near cavity obliteration.   The cavity size was normal. Wall thickness was increased in a   pattern of moderate to severe LVH. Systolic function was   vigorous. The estimated ejection fraction was in the range of 65%   to 70%. The study is not technically sufficient to allow   evaluation of LV diastolic function. Doppler parameters are   consistent with high ventricular filling pressure. - Aortic valve: AV is thickened, calcified with minimally   restricted motion.  There was mild regurgitation.  Cardiac Monitor 08/17/2015 Sinus rhythm/sinus brady with PVC's and PAC's No atrial fibrillation No sustained arrhythmia or pathologic pauses  Signed   Electronically signed by Tonny Bollman, MD on 10/06/15 at 2347 EDT    Lab Results  Basic Metabolic Panel:  Recent Labs Lab 01/13/16 1638 01/13/16 1647 01/13/16 1648 01/14/16 0443 01/15/16 0512  NA 138  --   --  139 137  K 3.5  --   --  5.0 4.4  CL 104  --   --  101 102  CO2 27  --   --  29 28  GLUCOSE 90  --   --  175* 165*  BUN 18  --   --  20 29*  CREATININE 1.18*  --   --  1.18* 1.23*  CALCIUM 7.8*  --   --  8.1* 8.0*  MG  --  1.8  --  2.4  --   PHOS  --   --  2.5  --   --     Liver Function Tests:  Recent Labs Lab 01/14/16 0443  AST 20  ALT 18  ALKPHOS 56  BILITOT 0.4  PROT 6.9  ALBUMIN 3.9    CBC:  Recent Labs Lab 01/13/16 1638 01/14/16 0443  WBC 5.7 5.8  NEUTROABS 4.6  --   HGB 10.0* 10.0*  HCT 31.3* 31.1*  MCV 86.5 86.6  PLT 170 174    Cardiac Enzymes:  Recent Labs Lab 01/13/16 1638 01/13/16 2226 01/14/16 0443  TROPONINI 0.05* 0.05* 0.05*    Radiology: Dg Chest 2 View  Result Date: 01/13/2016 CLINICAL DATA:  Chest congestion, soreness from coughing, LEFT side chest pain posteriorly extending to under LEFT breast, recent fall on 12/03/2015 breaking RIGHT ribs, COPD, asthma, hypertension, former smoker EXAM: CHEST  2 VIEW COMPARISON:  12/03/2015 FINDINGS: Enlargement of cardiac silhouette with pulmonary vascular congestion. Atherosclerotic calcification aorta. Peribronchial thickening without definite pulmonary infiltrate, pleural effusion or pneumothorax. Extrapleural density at the lower lateral RIGHT chest suspect related to recent RIGHT rib fractures. Diffuse osseous demineralization with chronic compression fractures at T5 and T11 unchanged. IMPRESSION: Enlargement of cardiac silhouette with pulmonary vascular congestion. Extrapleural density at the lower  lateral RIGHT chest likely related to recent RIGHT rib fractures. No definite acute infiltrate. Electronically Signed   By: Ulyses Southward M.D.   On: 01/13/2016 11:48     ECG: Normal sinus rhythm heart rate of 74 bpm no acute ST-T wave changes  Impression and Recommendations  1. NSVT: Patient had 2 episodes of nonsustained SVT, heart rates up to 130 beats a minute. The patient states she normally feels her heart rate going up when she is up and walking or exerting herself and it usually returns to normal with rest. She is asymptomatic with this. She has been placed on metoprolol 25 mg twice a day for heart rate control. She is on inhaled steroids and has been receiving IV steroids during this admission.  She continues test inspiratory wheezing with her COPD. Metoprolol has recently been started, I would be careful concerning worsening shortness of breath or wheezing on beta blocker. She does not appear to be symptomatic with SVT. Echocardiogram was recently completed in June 2017 with no wall motion abnormalities or structural heart disease. The patient did have a cardiac monitor placed in July 2017, this revealed sinus rhythm sinus bradycardia with PVCs and PACs, no atrial fibrillation, no sustained arrhythmias or pathologic pauses.  She had been on a beta blocker in the past but this was discontinued on 08/13/2014 office note due to bradycardia. Watch heart rate closely in the setting with this history. She also has a history of hypertrophic cardiomyopathy per cardiac catheterization. Heart rate control is essential.  2. Acute on chronic diastolic heart failure, New York Heart Association class III: Patient does not tolerate decreased Lasix dose when this occurs as an outpatient. Was on 40 mg twice a day which was decreased to 20 mg daily due to renal insufficiency. Would return patient to 40 mg in the morning and 20 mg in the afternoon. She does have a history of difficulty with dietary compliance.  Creatinine 1.23 this a.m. she has had minimal urine output from IV Lasix.  3. COPD- Oxygen-dependent:: On chronic steroid and nebulizer treatments, and by mouth prednisone at home.. She has received IV steroids during hospitalization. This may have exacerbated NSVT. Being treated for exacerbation by Hospitalist service..  4. Coronary artery disease: She denies recurrent chest pain. Most recent cardiac catheterization revealed patent stent to LAD, with evidence of hypertrophic cardiomyopathy. She was placed on nitrates during outpatient cardiology office visit and has not had any recurrent symptoms of chest discomfort.  5. Hypertension: Continues on losartan 25 mg daily, amlodipine 2.5 mg daily. Blood pressure has remained elevated. We'll increase amlodipine to 5 mg daily. Would not increase ARB due to renal insufficiency. With institution of low-dose beta blocker blood pressure will need to be checked closely as outpatient.  6. Dementia: She is cared for by her daughter, who is a Architectural technologistCT technologist. Outpatient follow-up by PCP for need for home assistance may be necessary.  7. Anemia: This is chronic for her. Review of labs through June 2017 shows hemoglobin ranging between 10.8-9.7. It does not appear that she is on iron replacement at home. May be related to chronic kidney disease. PCP follow-up is recommended for ongoing management.  Signed: Bettey MareKathryn M. Lawrence NP AACC  01/15/2016, 10:12 AM Co-Sign MD  The patient was seen and examined, and I agree with the history, physical exam, assessment and plan as documented above. Patient having runs of asymptomatic NSVT. Denies exertional chest pain. K and Mg normal. Has been started on metoprolol. Monitor for bradycardia.  She is stable for discharge from my standpoint and can follow up with Dr. Excell Seltzerooper within the next 3-4 weeks.  Prentice DockerSuresh Candace Ramus, MD, [facility] CenterFACC  01/15/2016 11:39 AM

## 2016-01-17 ENCOUNTER — Telehealth: Payer: Self-pay | Admitting: Family Medicine

## 2016-01-17 NOTE — Telephone Encounter (Signed)
Patient just recently in the hospital with heart failure. Please call make sure that she is doing okay and understands her medicines. Documented accordingly. Patient needs to follow-up with [name] within the next 2 weeks. Either this week or next week. Please have patient schedule. May need to discuss with either her husband or her daughter.

## 2016-01-18 NOTE — Telephone Encounter (Signed)
Tried to call no answer (voicemail box not setup) 

## 2016-01-19 ENCOUNTER — Other Ambulatory Visit: Payer: Self-pay | Admitting: Family Medicine

## 2016-01-19 DIAGNOSIS — N183 Chronic kidney disease, stage 3 (moderate): Secondary | ICD-10-CM | POA: Diagnosis not present

## 2016-01-19 DIAGNOSIS — J9610 Chronic respiratory failure, unspecified whether with hypoxia or hypercapnia: Secondary | ICD-10-CM | POA: Diagnosis not present

## 2016-01-19 DIAGNOSIS — J449 Chronic obstructive pulmonary disease, unspecified: Secondary | ICD-10-CM | POA: Diagnosis not present

## 2016-01-19 DIAGNOSIS — I130 Hypertensive heart and chronic kidney disease with heart failure and stage 1 through stage 4 chronic kidney disease, or unspecified chronic kidney disease: Secondary | ICD-10-CM | POA: Diagnosis not present

## 2016-01-19 DIAGNOSIS — S2241XD Multiple fractures of ribs, right side, subsequent encounter for fracture with routine healing: Secondary | ICD-10-CM | POA: Diagnosis not present

## 2016-01-19 DIAGNOSIS — I5032 Chronic diastolic (congestive) heart failure: Secondary | ICD-10-CM | POA: Diagnosis not present

## 2016-01-19 NOTE — Telephone Encounter (Signed)
Duplicate

## 2016-01-19 NOTE — Telephone Encounter (Signed)
Patient has appointment with Dr Lorin Picket 01/28/16 at 11 am

## 2016-01-19 NOTE — Telephone Encounter (Signed)
ok 

## 2016-01-19 NOTE — Telephone Encounter (Signed)
Refilled this for 6 months

## 2016-01-21 DIAGNOSIS — H34832 Tributary (branch) retinal vein occlusion, left eye, with macular edema: Secondary | ICD-10-CM | POA: Diagnosis not present

## 2016-01-27 DIAGNOSIS — I5032 Chronic diastolic (congestive) heart failure: Secondary | ICD-10-CM | POA: Diagnosis not present

## 2016-01-27 DIAGNOSIS — N183 Chronic kidney disease, stage 3 (moderate): Secondary | ICD-10-CM | POA: Diagnosis not present

## 2016-01-27 DIAGNOSIS — I130 Hypertensive heart and chronic kidney disease with heart failure and stage 1 through stage 4 chronic kidney disease, or unspecified chronic kidney disease: Secondary | ICD-10-CM | POA: Diagnosis not present

## 2016-01-27 DIAGNOSIS — J449 Chronic obstructive pulmonary disease, unspecified: Secondary | ICD-10-CM | POA: Diagnosis not present

## 2016-01-27 DIAGNOSIS — S2241XD Multiple fractures of ribs, right side, subsequent encounter for fracture with routine healing: Secondary | ICD-10-CM | POA: Diagnosis not present

## 2016-01-27 DIAGNOSIS — J9610 Chronic respiratory failure, unspecified whether with hypoxia or hypercapnia: Secondary | ICD-10-CM | POA: Diagnosis not present

## 2016-01-28 ENCOUNTER — Other Ambulatory Visit: Payer: Self-pay | Admitting: Family Medicine

## 2016-01-28 ENCOUNTER — Other Ambulatory Visit: Payer: Self-pay

## 2016-01-28 ENCOUNTER — Ambulatory Visit (INDEPENDENT_AMBULATORY_CARE_PROVIDER_SITE_OTHER): Payer: Medicare Other | Admitting: Family Medicine

## 2016-01-28 VITALS — BP 122/74 | Temp 98.7°F | Wt 176.0 lb

## 2016-01-28 DIAGNOSIS — I10 Essential (primary) hypertension: Secondary | ICD-10-CM | POA: Diagnosis not present

## 2016-01-28 DIAGNOSIS — I5032 Chronic diastolic (congestive) heart failure: Secondary | ICD-10-CM | POA: Diagnosis not present

## 2016-01-28 DIAGNOSIS — IMO0002 Reserved for concepts with insufficient information to code with codable children: Secondary | ICD-10-CM

## 2016-01-28 DIAGNOSIS — N644 Mastodynia: Secondary | ICD-10-CM

## 2016-01-28 DIAGNOSIS — N6310 Unspecified lump in the right breast, unspecified quadrant: Secondary | ICD-10-CM

## 2016-01-28 DIAGNOSIS — Z1231 Encounter for screening mammogram for malignant neoplasm of breast: Secondary | ICD-10-CM

## 2016-01-28 DIAGNOSIS — R229 Localized swelling, mass and lump, unspecified: Secondary | ICD-10-CM

## 2016-01-28 DIAGNOSIS — R0609 Other forms of dyspnea: Secondary | ICD-10-CM

## 2016-01-28 NOTE — Progress Notes (Signed)
   Subjective:    Patient ID: [name], female    DOB: 12-09-1935, 80 y.o.   MRN: [id number]  HPIfollow up hospitalization. Still coughing up green mucus and wheezing but doing better.  This patient was in the hospital for multiple days with heart failure diastolic dysfunction also COPD exacerbation she was communicated with and instructed to follow-up. She brings all of her medicines in today for review. Her daughter is with her. We talked at length about how to best keep this under control by watching how he eats minimizing salt intake watching closely for swelling along with daily weights. Brought in bp readings and weight log.     Review of Systems Patient denies any chest pressure tightness shortness of breath this at when she pushes herself denies swelling in the legs vomiting diarrhea fever chills    Objective:   Physical Exam neck no masses lungs are clear no crackles heart is regular pulse normal extremities no edema skin warm dry Patient also relates right breast pain rest exam was normal no masses felt she was sent for diagnostic mammogram await the results We will go ahead and do lab work to make sure that her BMP is improved she does not appear to be in any type of failure currently    Assessment & Plan:  Post hospital follow-up All medications were reviewed Met 7 BNP ordered await the results Proper diet proper use of inhalers discussed No sign of CHF currently Follow-up with cardiology next week follow-up with [name] in one month

## 2016-01-29 LAB — BASIC METABOLIC PANEL
BUN / CREAT RATIO: 14 (ref 12–28)
BUN: 16 mg/dL (ref 8–27)
CHLORIDE: 102 mmol/L (ref 96–106)
CO2: 23 mmol/L (ref 18–29)
Calcium: 8.3 mg/dL — ABNORMAL LOW (ref 8.7–10.3)
Creatinine, Ser: 1.12 mg/dL — ABNORMAL HIGH (ref 0.57–1.00)
GFR calc non Af Amer: 47 mL/min/{1.73_m2} — ABNORMAL LOW (ref >59)
GFR, EST AFRICAN AMERICAN: 54 mL/min/{1.73_m2} — AB (ref >59)
Glucose: 104 mg/dL — ABNORMAL HIGH (ref 65–99)
Potassium: 4.6 mmol/L (ref 3.5–5.2)
Sodium: 145 mmol/L — ABNORMAL HIGH (ref 134–144)

## 2016-01-29 LAB — BRAIN NATRIURETIC PEPTIDE: BNP: 680.2 pg/mL — ABNORMAL HIGH (ref 0.0–100.0)

## 2016-02-01 NOTE — Progress Notes (Signed)
CARDIOLOGY OFFICE NOTE  Date:  02/02/2016    [name] Date of Birth: [birth year] Medical Record #[id number]  PCP:  Heather Punt, MD  Cardiologist:  [name]    Chief Complaint  Patient presents with  . Congestive Heart Failure  . Shortness of Breath    Post hospital visit - seen for Dr. Excell Cummings    History of Present Illness: [name] is a 80 y.o. female who presents today for a post hospital visit. Seen for Dr. Excell Cummings.   She has CAD and diastolic heart failure. She has coronary artery disease status post PCI of the LAD in 2011. She underwent cardiac catheterization in 2014 demonstrating patency of her stent site in the LAD. There was no other evidence of obstructive disease noted. Ventriculography was suggestive of apical hypertrophy. A cardiac MRI suggests a variant of hypertrophic cardiomyopathy as well. The patient has COPD and is now O2 dependent.  Her other issues include dementia, HTN, COPD, HLD and diastolic HF. She has been noted to be quite sedentary and has trouble adhering to salt restriction.   I last saw her back in June of 2017. Her symptoms were getting harder to discern and it was getting harder to take care of her.  Chronically short of breath. Non compliant with salt restriction.   Admitted last month with worsening SOB. Had had the flu. Treated for diastolic HF and COPD exacerbation. Noted to have had NSVT. No intervention recommended by cardiology. Troponin mildly elevated - attributed to demand ischemia. Metoprolol was started. Her dose of ARB was cut back.   Comes back today. Here alone today - husband in the lobby. Her grand daughter - [name] - works in [place] here. Very short of breath with coming in to the exam room - down to 69% despite being on 4 liters. Slowly up to 90% with deep breathing and sitting. She does not know any of her medicines. She has not had her medicines today. Does not really seem to be any better since her  admission. Weight is already going back up. She does not restrict her salt. She does know that she has finished her steroids. On low dose Lasix - much lower than what she has been on in the past. She says she is fine as long as "she just sits". [name] tells me that she has gotten more confused - she actually had a butcher knife out and was chasing her husband. She has been made a DNR but still using Bipap. Overall, pretty miserable in general.   Past Medical History:  Diagnosis Date  . Acute on chronic diastolic heart failure (HCC) 04/11/2014  . Asthma   . Bradycardia   . CAD (coronary artery disease) 08/2009   s/p PCI of the LAD  . Carotid artery stenosis   . Chronic headache   . COPD (chronic obstructive pulmonary disease) (HCC)   . Dementia    patient denies this  . Hyperlipidemia   . Hypertension   . Impaired fasting glucose   . Myocardial infarction    Non-st segment elevated  . On home O2   . Osteoporosis 2009  . Pneumonia   . Seasonal allergies   . Ulnar neuropathy     Past Surgical History:  Procedure Laterality Date  . BLADDER SURGERY  1991  . CARDIAC CATHETERIZATION  11/2010   negative  . CATARACT EXTRACTION  Nov 2016  . LEFT HEART CATHETERIZATION WITH CORONARY ANGIOGRAM N/A 11/05/2012  Procedure: LEFT HEART CATHETERIZATION WITH CORONARY ANGIOGRAM;  Surgeon: Heather Chapman, MD;  Location: [facility] C[facility] LAB;  Service: Cardiovascular;  Laterality: N/A;  . PARTIAL HYSTERECTOMY  1984  . Radial artery catheter     Bladder     Medications: Current Outpatient Prescriptions  Medication Sig Dispense Refill  . albuterol (PROVENTIL HFA;VENTOLIN HFA) 108 (90 Base) MCG/ACT inhaler Inhale 2 puffs into the lungs 3 (three) times daily. 1 Inhaler 3  . albuterol (PROVENTIL) (2.5 MG/3ML) 0.083% nebulizer solution Take 3 mLs (2.5 mg total) by nebulization every 4 (four) hours as needed for wheezing or shortness of breath. 75 mL 2  . alendronate (FOSAMAX) 70 MG tablet TAKE 1 TABLET BY  MOUTH 1 TIME WEEKLY AS DIRECTED 12 tablet 0  . amLODipine (NORVASC) 5 MG tablet TAKE 1/2 TABLET BY MOUTH DAILY 45 tablet 5  . Ascorbic Acid (VITAMIN C PO) Take 1 tablet by mouth daily.    Marland Kitchen aspirin EC 81 MG tablet Take 81 mg by mouth daily.    . budesonide-formoterol (SYMBICORT) 160-4.5 MCG/ACT inhaler Inhale 2 puffs into the lungs 2 (two) times daily. 1 Inhaler 12  . busPIRone (BUSPAR) 7.5 MG tablet Take 7.5 mg by mouth 3 (three) times daily.    . Calcium Carbonate (CALCIUM 600 PO) Take 1 tablet by mouth daily.     . cetirizine (ZYRTEC) 10 MG tablet Take 10 mg by mouth daily.    . Cholecalciferol (VITAMIN D3) 1000 UNITS CAPS Take 1 capsule by mouth daily.    Marland Kitchen donepezil (ARICEPT) 10 MG tablet TAKE 1 TABLET BY MOUTH EVERY NIGHT AT BEDTIME 90 tablet 0  . furosemide (LASIX) 20 MG tablet Take 3 tablets (60 mg total) by mouth daily. 30 tablet 3  . ipratropium (ATROVENT) 0.02 % nebulizer solution Take 2.5 mLs (0.5 mg total) by nebulization every 6 (six) hours as needed for wheezing or shortness of breath. 75 mL 12  . isosorbide mononitrate (IMDUR) 30 MG 24 hr tablet Take 0.5 tablets (15 mg total) by mouth daily. 30 tablet 3  . LORazepam (ATIVAN) 1 MG tablet TAKE 1/2 TABLET TO 1 TABLET BY MOUTH EVERY NIGHT AT BEDTIME AS NEEDED 30 tablet 5  . losartan (COZAAR) 25 MG tablet Take 1 tablet (25 mg total) by mouth daily. 30 tablet 2  . metoprolol tartrate (LOPRESSOR) 25 MG tablet Take 1 tablet (25 mg total) by mouth 2 (two) times daily. 60 tablet 2  . nitroGLYCERIN (NITROSTAT) 0.4 MG SL tablet Place 1 tablet (0.4 mg total) under the tongue as needed for chest pain. 25 tablet 0  . omeprazole (PRILOSEC) 20 MG capsule Take 20 mg by mouth daily.    . ondansetron (ZOFRAN) 8 MG tablet Take 1 tablet (8 mg total) by mouth every 8 (eight) hours as needed for nausea. 20 tablet 6  . polyethylene glycol (MIRALAX / GLYCOLAX) packet Take 17 g by mouth daily as needed for moderate constipation.    . pravastatin (PRAVACHOL)  40 MG tablet TAKE 1 TABLET BY MOUTH EVERY DAY 90 tablet 0  . ranitidine (ZANTAC) 150 MG tablet Take 150 mg by mouth daily as needed for heartburn.    . sertraline (ZOLOFT) 100 MG tablet TAKE 1& 1/2 TABLETS BY MOUTH DAILY 225 tablet 1  . Simethicone (GAS RELIEF PO) Take 1 tablet by mouth daily as needed (gas relief).    . TRAVATAN Z 0.004 % SOLN ophthalmic solution Place 1 drop into the left eye at bedtime.  No current facility-administered medications for this visit.     Allergies: Allergies  Allergen Reactions  . Codeine Nausea And Vomiting  . Dilaudid [Hydromorphone Hcl] Other (See Comments)    Extremely sensitive  . Erythromycin Other (See Comments)    Intense stomach pain  . Sulfonamide Derivatives     unknown    Social History: The patient  reports that she quit smoking about 7 years ago. She has a 35.00 pack-year smoking history. She has never used smokeless tobacco. She reports that she does not drink alcohol or use drugs.   Family History: The patient's family history includes Addison's disease in her mother; Alcohol abuse in her father.   Review of Systems: Please see the history of present illness.   Otherwise, the review of systems is positive for none.   All other systems are reviewed and negative.   Physical Exam: VS:  BP (!) 148/80   Pulse 63   Ht 5\' 2"  (1.575 m)   Wt 178 lb 12.8 oz (81.1 kg)   SpO2 93% Comment: 4 L of 02  BMI 32.70 kg/m  .  BMI Body mass index is 32.7 kg/m.  Wt Readings from Last 3 Encounters:  02/02/16 178 lb 12.8 oz (81.1 kg)  01/28/16 176 lb (79.8 kg)  01/15/16 172 lb (78 kg)    General: Pleasant. Chronically ill. Very short of breath upon coming into the exam room. She is alert. Oxygen level is down to 69% on 4 liters.  HEENT: Normal.  Neck: Supple, no JVD, carotid bruits, or masses noted.  Cardiac: Regular rate and rhythm. No murmurs, rubs, or gallops. No edema. She has oxygen at 4 liters in place.  Respiratory:  Lungs with  decreased breath sounds and with increased work of breathing.  GI: Soft and nontender.  MS: No deformity or atrophy. Gait and ROM intact.  Skin: Warm and dry. Color is normal.  Neuro:  Strength and sensation are intact and no gross focal deficits noted.  Psych: Alert, appropriate and with normal affect.   LABORATORY DATA:  EKG:  EKG is not ordered today.  Lab Results  Component Value Date   WBC 5.8 01/14/2016   HGB 10.0 (L) 01/14/2016   HCT 31.1 (L) 01/14/2016   PLT 174 01/14/2016   GLUCOSE 104 (H) 01/28/2016   CHOL 160 04/21/2015   TRIG 131 04/21/2015   HDL 42 04/21/2015   LDLCALC 92 04/21/2015   ALT 18 01/14/2016   AST 20 01/14/2016   NA 145 (H) 01/28/2016   K 4.6 01/28/2016   CL 102 01/28/2016   CREATININE 1.12 (H) 01/28/2016   BUN 16 01/28/2016   CO2 23 01/28/2016   TSH 3.086 12/06/2013   INR 1.11 05/07/2013   HGBA1C 5.8 (H) 04/21/2015    BNP (last 3 results)  Recent Labs  01/13/16 1640 01/28/16 1208  BNP 622.0* 680.2*    ProBNP (last 3 results) No results for input(s): PROBNP in the last 8760 hours.   Other Studies Reviewed Today:  Echo Study Conclusions from 03/2014  - Left ventricle: The cavity size was normal. Wall thickness was increased in a pattern of mild LVH. Systolic function was vigorous. The estimated ejection fraction was in the range of 65% to 70%. Wall motion was normal; there were no regional wall motion abnormalities. Features are consistent with a pseudonormal left ventricular filling pattern, with concomitant abnormal relaxation and increased filling pressure (grade 2 diastolic dysfunction). - Aortic valve: Mildly calcified annulus. Trileaflet; mildly thickened  leaflets. There was trivial regurgitation. Valve area (VTI): 1.83 cm^2. Valve area (Vmax): 1.74 cm^2. Valve area (Vmean): 2.02 cm^2. - Left atrium: The atrium was moderately dilated. - Right atrium: The atrium was mildly dilated. - Pericardium,  extracardiac: There is a large left pleural effusion. There was no pericardial effusion. - Technically difficult study.   CAROTID DOPPLER IMPRESSION FROM 12/2014:  1. Moderate to large amount of atherosclerotic plaque, left greater than right, resulting in elevated peak systolic velocities within the bilateral internal carotid arteries correlated with the greater than 70% luminal narrowing on the left and within the higher end of the 50-69% luminal narrowing on the right. Further evaluation with CTA could be performed as clinically indicated. 2. Antegrade flow is demonstrated within the bilateral vertebral arteries. 3. Incidental note is made of a cardiac arrhythmia. Further evaluation with ECG monitoring could be performed as clinically indicated.   Electronically Signed  By: Simonne Come M.D.  On: 12/18/2014 17:01  Assessment/Plan:  1. Acute on chronic diastolic HF - will increase her Lasix - try 60 mg for now. Labs today. Would not worry about worsening kidney function at this point.   2. COPD exacerbation - very hypoxic despite oxygen. Talked with [name] here - she has been made a DNR - made need to consider Hospice referral. Will give steroid dose pak - to take as directed. Continue oxygen.  3.  Prior abnormal eye exam - noted branch retinal vein occlusion of left eye   4 CAD, native vessel, with symptoms of angina: manage conservatively  5. Chronic diastolic heart failure, [place] Heart Association Class 3/4: Shortness of breath is a combination of lung disease and diastolic heart failure. The patient remains very limited. She is now on home O2 and requiring higher FiO2.  Do not feel there is much we can do. Consider Hospice going forward.   5. Hypertension with chronic kidney disease stage III: She remains on a combination of amlodipine and losartan.   6. Prior bradycardia - Her beta blocker had to be stopped in the past due to bradycardia - now back on for  NSVT - HR is acceptable today.  Current medicines are reviewed with the patient today.  The patient does not have concerns regarding medicines other than what has been noted above.  The following changes have been made:  See above.  Labs/ tests ordered today include:    Orders Placed This Encounter  Procedures  . Brain natriuretic peptide  . Basic metabolic panel  . CBC     Disposition:   Lasix is increase. Medrol dose pak given. Overall situation very tenuous. [name] is aware of the situation. May consider Hospice.   Patient is agreeable to this plan and will call if any problems develop in the interim.   Signed: Rosalio Macadamia, RN, ANP-C 02/02/2016 10:28 AM  [facility] [address] [place], [place]  [address] Phone: [phone] Fax: [phone]

## 2016-02-02 ENCOUNTER — Ambulatory Visit (INDEPENDENT_AMBULATORY_CARE_PROVIDER_SITE_OTHER): Payer: Medicare Other | Admitting: Nurse Practitioner

## 2016-02-02 ENCOUNTER — Telehealth: Payer: Self-pay | Admitting: Family Medicine

## 2016-02-02 ENCOUNTER — Telehealth: Payer: Self-pay | Admitting: *Deleted

## 2016-02-02 ENCOUNTER — Encounter: Payer: Self-pay | Admitting: Nurse Practitioner

## 2016-02-02 VITALS — BP 148/80 | HR 63 | Ht 62.0 in | Wt 178.8 lb

## 2016-02-02 DIAGNOSIS — J9610 Chronic respiratory failure, unspecified whether with hypoxia or hypercapnia: Secondary | ICD-10-CM | POA: Diagnosis not present

## 2016-02-02 DIAGNOSIS — S2241XD Multiple fractures of ribs, right side, subsequent encounter for fracture with routine healing: Secondary | ICD-10-CM | POA: Diagnosis not present

## 2016-02-02 DIAGNOSIS — I5032 Chronic diastolic (congestive) heart failure: Secondary | ICD-10-CM

## 2016-02-02 DIAGNOSIS — N183 Chronic kidney disease, stage 3 (moderate): Secondary | ICD-10-CM | POA: Diagnosis not present

## 2016-02-02 DIAGNOSIS — I130 Hypertensive heart and chronic kidney disease with heart failure and stage 1 through stage 4 chronic kidney disease, or unspecified chronic kidney disease: Secondary | ICD-10-CM | POA: Diagnosis not present

## 2016-02-02 DIAGNOSIS — I6523 Occlusion and stenosis of bilateral carotid arteries: Secondary | ICD-10-CM

## 2016-02-02 DIAGNOSIS — J449 Chronic obstructive pulmonary disease, unspecified: Secondary | ICD-10-CM | POA: Diagnosis not present

## 2016-02-02 LAB — CBC
HCT: 29.4 % — ABNORMAL LOW (ref 35.0–45.0)
Hemoglobin: 9.4 g/dL — ABNORMAL LOW (ref 11.7–15.5)
MCH: 27.2 pg (ref 27.0–33.0)
MCHC: 32 g/dL (ref 32.0–36.0)
MCV: 85 fL (ref 80.0–100.0)
MPV: 9.4 fL (ref 7.5–12.5)
Platelets: 181 10*3/uL (ref 140–400)
RBC: 3.46 MIL/uL — ABNORMAL LOW (ref 3.80–5.10)
RDW: 15 % (ref 11.0–15.0)
WBC: 6.6 10*3/uL (ref 3.8–10.8)

## 2016-02-02 LAB — BASIC METABOLIC PANEL
BUN: 22 mg/dL (ref 7–25)
CO2: 23 mmol/L (ref 20–31)
Calcium: 7.8 mg/dL — ABNORMAL LOW (ref 8.6–10.4)
Chloride: 109 mmol/L (ref 98–110)
Creat: 1.4 mg/dL — ABNORMAL HIGH (ref 0.60–0.93)
Glucose, Bld: 124 mg/dL — ABNORMAL HIGH (ref 65–99)
Potassium: 4.2 mmol/L (ref 3.5–5.3)
Sodium: 142 mmol/L (ref 135–146)

## 2016-02-02 LAB — BRAIN NATRIURETIC PEPTIDE: Brain Natriuretic Peptide: 706.9 pg/mL — ABNORMAL HIGH (ref <100)

## 2016-02-02 MED ORDER — FUROSEMIDE 20 MG PO TABS: 60.0000 mg | ORAL_TABLET | Freq: Every day | ORAL | 3 refills | Status: DC

## 2016-02-02 NOTE — Telephone Encounter (Signed)
[name] at [facility] notified.

## 2016-02-02 NOTE — Progress Notes (Signed)
Thanks [name]. Agree with your plans and recommendations. [name]

## 2016-02-02 NOTE — Telephone Encounter (Signed)
Please give verbal order to continue as requested

## 2016-02-02 NOTE — Telephone Encounter (Signed)
Nurse Carollee Herter from [facility] calling for viral for continued care of patient for the next nine weeks.[phone]

## 2016-02-02 NOTE — Patient Instructions (Addendum)
We will be checking the following labs today - BMET, CBC and BNP   Medication Instructions:    Continue with your current medicines. BUT  I am going to increase the Lasix to 60 mg a day  I am sending in a steroid dose pack - take as directed    Testing/Procedures To Be Arranged:  N/A  Follow-Up:   Misty Stanley will be in contact with me and give me an update.      Other Special Instructions:   N/A    If you need a refill on your cardiac medications before your next appointment, please call your pharmacy.   Call the [facility] office at [phone] if you have any questions, problems or concerns.

## 2016-02-02 NOTE — Telephone Encounter (Signed)
Sent in metdrol dose pack as directed.  Would not let [name] send in electronically.

## 2016-02-04 DIAGNOSIS — G309 Alzheimer's disease, unspecified: Secondary | ICD-10-CM | POA: Diagnosis not present

## 2016-02-04 DIAGNOSIS — Z9981 Dependence on supplemental oxygen: Secondary | ICD-10-CM | POA: Diagnosis not present

## 2016-02-04 DIAGNOSIS — N183 Chronic kidney disease, stage 3 (moderate): Secondary | ICD-10-CM | POA: Diagnosis not present

## 2016-02-04 DIAGNOSIS — I2510 Atherosclerotic heart disease of native coronary artery without angina pectoris: Secondary | ICD-10-CM | POA: Diagnosis not present

## 2016-02-04 DIAGNOSIS — J9610 Chronic respiratory failure, unspecified whether with hypoxia or hypercapnia: Secondary | ICD-10-CM | POA: Diagnosis not present

## 2016-02-04 DIAGNOSIS — I130 Hypertensive heart and chronic kidney disease with heart failure and stage 1 through stage 4 chronic kidney disease, or unspecified chronic kidney disease: Secondary | ICD-10-CM | POA: Diagnosis not present

## 2016-02-04 DIAGNOSIS — R7303 Prediabetes: Secondary | ICD-10-CM | POA: Diagnosis not present

## 2016-02-04 DIAGNOSIS — S2241XD Multiple fractures of ribs, right side, subsequent encounter for fracture with routine healing: Secondary | ICD-10-CM | POA: Diagnosis not present

## 2016-02-04 DIAGNOSIS — J441 Chronic obstructive pulmonary disease with (acute) exacerbation: Secondary | ICD-10-CM | POA: Diagnosis not present

## 2016-02-04 DIAGNOSIS — Z7951 Long term (current) use of inhaled steroids: Secondary | ICD-10-CM | POA: Diagnosis not present

## 2016-02-04 DIAGNOSIS — Z7982 Long term (current) use of aspirin: Secondary | ICD-10-CM | POA: Diagnosis not present

## 2016-02-04 DIAGNOSIS — I5033 Acute on chronic diastolic (congestive) heart failure: Secondary | ICD-10-CM | POA: Diagnosis not present

## 2016-02-04 DIAGNOSIS — F0280 Dementia in other diseases classified elsewhere without behavioral disturbance: Secondary | ICD-10-CM | POA: Diagnosis not present

## 2016-02-05 ENCOUNTER — Emergency Department (HOSPITAL_COMMUNITY): Payer: Medicare Other

## 2016-02-05 ENCOUNTER — Telehealth: Payer: Self-pay | Admitting: *Deleted

## 2016-02-05 ENCOUNTER — Encounter (HOSPITAL_COMMUNITY): Payer: Self-pay | Admitting: Emergency Medicine

## 2016-02-05 ENCOUNTER — Inpatient Hospital Stay (HOSPITAL_COMMUNITY)
Admission: EM | Admit: 2016-02-05 | Discharge: 2016-02-07 | DRG: 291 | Disposition: A | Discharge status: Home Health | Payer: Medicare Other | Attending: Internal Medicine | Admitting: Internal Medicine

## 2016-02-05 DIAGNOSIS — Z66 Do not resuscitate: Secondary | ICD-10-CM | POA: Diagnosis present

## 2016-02-05 DIAGNOSIS — N183 Chronic kidney disease, stage 3 unspecified: Secondary | ICD-10-CM | POA: Diagnosis present

## 2016-02-05 DIAGNOSIS — Z9981 Dependence on supplemental oxygen: Secondary | ICD-10-CM | POA: Diagnosis not present

## 2016-02-05 DIAGNOSIS — I5033 Acute on chronic diastolic (congestive) heart failure: Secondary | ICD-10-CM | POA: Diagnosis not present

## 2016-02-05 DIAGNOSIS — J90 Pleural effusion, not elsewhere classified: Secondary | ICD-10-CM

## 2016-02-05 DIAGNOSIS — Z9119 Patient's noncompliance with other medical treatment and regimen: Secondary | ICD-10-CM

## 2016-02-05 DIAGNOSIS — I252 Old myocardial infarction: Secondary | ICD-10-CM | POA: Diagnosis not present

## 2016-02-05 DIAGNOSIS — F0390 Unspecified dementia without behavioral disturbance: Secondary | ICD-10-CM | POA: Diagnosis present

## 2016-02-05 DIAGNOSIS — J449 Chronic obstructive pulmonary disease, unspecified: Secondary | ICD-10-CM | POA: Diagnosis not present

## 2016-02-05 DIAGNOSIS — I130 Hypertensive heart and chronic kidney disease with heart failure and stage 1 through stage 4 chronic kidney disease, or unspecified chronic kidney disease: Principal | ICD-10-CM | POA: Diagnosis present

## 2016-02-05 DIAGNOSIS — R0602 Shortness of breath: Secondary | ICD-10-CM | POA: Diagnosis not present

## 2016-02-05 DIAGNOSIS — Z885 Allergy status to narcotic agent status: Secondary | ICD-10-CM | POA: Diagnosis not present

## 2016-02-05 DIAGNOSIS — J9611 Chronic respiratory failure with hypoxia: Secondary | ICD-10-CM

## 2016-02-05 DIAGNOSIS — I10 Essential (primary) hypertension: Secondary | ICD-10-CM | POA: Diagnosis present

## 2016-02-05 DIAGNOSIS — Z87891 Personal history of nicotine dependence: Secondary | ICD-10-CM

## 2016-02-05 DIAGNOSIS — Z888 Allergy status to other drugs, medicaments and biological substances status: Secondary | ICD-10-CM

## 2016-02-05 DIAGNOSIS — N186 End stage renal disease: Secondary | ICD-10-CM | POA: Diagnosis not present

## 2016-02-05 DIAGNOSIS — Z881 Allergy status to other antibiotic agents status: Secondary | ICD-10-CM

## 2016-02-05 DIAGNOSIS — I509 Heart failure, unspecified: Secondary | ICD-10-CM | POA: Diagnosis not present

## 2016-02-05 DIAGNOSIS — E785 Hyperlipidemia, unspecified: Secondary | ICD-10-CM | POA: Diagnosis present

## 2016-02-05 DIAGNOSIS — I2510 Atherosclerotic heart disease of native coronary artery without angina pectoris: Secondary | ICD-10-CM | POA: Diagnosis present

## 2016-02-05 DIAGNOSIS — M810 Age-related osteoporosis without current pathological fracture: Secondary | ICD-10-CM | POA: Diagnosis present

## 2016-02-05 DIAGNOSIS — Z79899 Other long term (current) drug therapy: Secondary | ICD-10-CM

## 2016-02-05 DIAGNOSIS — I421 Obstructive hypertrophic cardiomyopathy: Secondary | ICD-10-CM | POA: Diagnosis present

## 2016-02-05 DIAGNOSIS — I1311 Hypertensive heart and chronic kidney disease without heart failure, with stage 5 chronic kidney disease, or end stage renal disease: Secondary | ICD-10-CM | POA: Diagnosis not present

## 2016-02-05 DIAGNOSIS — Z882 Allergy status to sulfonamides status: Secondary | ICD-10-CM | POA: Diagnosis not present

## 2016-02-05 DIAGNOSIS — J9610 Chronic respiratory failure, unspecified whether with hypoxia or hypercapnia: Secondary | ICD-10-CM | POA: Diagnosis present

## 2016-02-05 LAB — BASIC METABOLIC PANEL
Anion gap: 10 (ref 5–15)
BUN: 29 mg/dL — AB (ref 6–20)
CHLORIDE: 103 mmol/L (ref 101–111)
CO2: 26 mmol/L (ref 22–32)
CREATININE: 1.33 mg/dL — AB (ref 0.44–1.00)
Calcium: 8.9 mg/dL (ref 8.9–10.3)
GFR calc Af Amer: 43 mL/min — ABNORMAL LOW (ref >60)
GFR calc non Af Amer: 37 mL/min — ABNORMAL LOW (ref >60)
Glucose, Bld: 138 mg/dL — ABNORMAL HIGH (ref 65–99)
POTASSIUM: 3.4 mmol/L — AB (ref 3.5–5.1)
Sodium: 139 mmol/L (ref 135–145)

## 2016-02-05 LAB — CBC
HEMATOCRIT: 31.9 % — AB (ref 36.0–46.0)
Hemoglobin: 10.3 g/dL — ABNORMAL LOW (ref 12.0–15.0)
MCH: 27.6 pg (ref 26.0–34.0)
MCHC: 32.3 g/dL (ref 30.0–36.0)
MCV: 85.5 fL (ref 78.0–100.0)
PLATELETS: 207 10*3/uL (ref 150–400)
RBC: 3.73 MIL/uL — AB (ref 3.87–5.11)
RDW: 14.3 % (ref 11.5–15.5)
WBC: 6.9 10*3/uL (ref 4.0–10.5)

## 2016-02-05 LAB — I-STAT TROPONIN, ED: Troponin i, poc: 0.04 ng/mL (ref 0.00–0.08)

## 2016-02-05 LAB — TROPONIN I: TROPONIN I: 0.04 ng/mL — AB (ref <0.03)

## 2016-02-05 LAB — BRAIN NATRIURETIC PEPTIDE: B NATRIURETIC PEPTIDE 5: 1173 pg/mL — AB (ref 0.0–100.0)

## 2016-02-05 MED ORDER — ONDANSETRON HCL 4 MG/2ML IJ SOLN: 4.0000 mg | Freq: Four times a day (QID) | INTRAMUSCULAR | Status: DC | PRN

## 2016-02-05 MED ORDER — METHYLPREDNISOLONE 4 MG PO TBPK: 4.0000 mg | ORAL_TABLET | Freq: Four times a day (QID) | ORAL | Status: DC

## 2016-02-05 MED ORDER — FUROSEMIDE 10 MG/ML IJ SOLN
60.0000 mg | Freq: Two times a day (BID) | INTRAMUSCULAR | Status: DC
  Administered 2016-02-05 – 2016-02-07 (×4): 60 mg via INTRAVENOUS
  Filled 2016-02-05 (×4): qty 6

## 2016-02-05 MED ORDER — IPRATROPIUM-ALBUTEROL 0.5-2.5 (3) MG/3ML IN SOLN
3.0000 mL | Freq: Once | RESPIRATORY_TRACT | Status: AC
  Administered 2016-02-05: 3 mL via RESPIRATORY_TRACT
  Filled 2016-02-05: qty 3

## 2016-02-05 MED ORDER — IPRATROPIUM-ALBUTEROL 0.5-2.5 (3) MG/3ML IN SOLN
3.0000 mL | Freq: Four times a day (QID) | RESPIRATORY_TRACT | Status: DC
  Administered 2016-02-05 – 2016-02-07 (×6): 3 mL via RESPIRATORY_TRACT
  Filled 2016-02-05 (×6): qty 3

## 2016-02-05 MED ORDER — LORAZEPAM 0.5 MG PO TABS
0.2500 mg | ORAL_TABLET | Freq: Every evening | ORAL | Status: DC | PRN
  Administered 2016-02-05 – 2016-02-06 (×2): 0.5 mg via ORAL
  Filled 2016-02-05 (×2): qty 1

## 2016-02-05 MED ORDER — PRAVASTATIN SODIUM 40 MG PO TABS
40.0000 mg | ORAL_TABLET | Freq: Every day | ORAL | Status: DC
  Administered 2016-02-05 – 2016-02-07 (×3): 40 mg via ORAL
  Filled 2016-02-05 (×3): qty 1

## 2016-02-05 MED ORDER — ISOSORBIDE MONONITRATE ER 30 MG PO TB24
15.0000 mg | ORAL_TABLET | Freq: Every day | ORAL | Status: DC
  Administered 2016-02-05 – 2016-02-07 (×3): 15 mg via ORAL
  Filled 2016-02-05 (×3): qty 1

## 2016-02-05 MED ORDER — EYE WASH OPHTH SOLN: 1.0000 [drp] | OPHTHALMIC | Status: DC | PRN

## 2016-02-05 MED ORDER — PANTOPRAZOLE SODIUM 40 MG PO TBEC
40.0000 mg | DELAYED_RELEASE_TABLET | Freq: Every day | ORAL | Status: DC
  Administered 2016-02-05 – 2016-02-07 (×3): 40 mg via ORAL
  Filled 2016-02-05 (×3): qty 1

## 2016-02-05 MED ORDER — METHYLPREDNISOLONE 4 MG PO TABS
4.0000 mg | ORAL_TABLET | Freq: Every day | ORAL | Status: AC
  Administered 2016-02-06 – 2016-02-07 (×2): 4 mg via ORAL
  Filled 2016-02-05 (×2): qty 1

## 2016-02-05 MED ORDER — SODIUM CHLORIDE 0.9 % IV SOLN: 250.0000 mL | INTRAVENOUS | Status: DC | PRN

## 2016-02-05 MED ORDER — ASPIRIN EC 81 MG PO TBEC
81.0000 mg | DELAYED_RELEASE_TABLET | Freq: Every day | ORAL | Status: DC
  Administered 2016-02-05 – 2016-02-07 (×3): 81 mg via ORAL
  Filled 2016-02-05 (×3): qty 1

## 2016-02-05 MED ORDER — METOPROLOL TARTRATE 25 MG PO TABS
25.0000 mg | ORAL_TABLET | Freq: Two times a day (BID) | ORAL | Status: DC
  Administered 2016-02-05 – 2016-02-07 (×4): 25 mg via ORAL
  Filled 2016-02-05 (×5): qty 1

## 2016-02-05 MED ORDER — ACETAMINOPHEN 325 MG PO TABS: 650.0000 mg | ORAL_TABLET | ORAL | Status: DC | PRN

## 2016-02-05 MED ORDER — SODIUM CHLORIDE 0.9% FLUSH: 3.0000 mL | INTRAVENOUS | Status: DC | PRN

## 2016-02-05 MED ORDER — FUROSEMIDE 10 MG/ML IJ SOLN
80.0000 mg | Freq: Once | INTRAMUSCULAR | Status: AC
  Administered 2016-02-05: 80 mg via INTRAVENOUS
  Filled 2016-02-05: qty 8

## 2016-02-05 MED ORDER — ENOXAPARIN SODIUM 40 MG/0.4ML ~~LOC~~ SOLN
40.0000 mg | SUBCUTANEOUS | Status: DC
  Administered 2016-02-05 – 2016-02-06 (×2): 40 mg via SUBCUTANEOUS
  Filled 2016-02-05 (×2): qty 0.4

## 2016-02-05 MED ORDER — LOSARTAN POTASSIUM 50 MG PO TABS
25.0000 mg | ORAL_TABLET | Freq: Every day | ORAL | Status: DC
  Administered 2016-02-05 – 2016-02-07 (×3): 25 mg via ORAL
  Filled 2016-02-05 (×3): qty 1

## 2016-02-05 MED ORDER — MOMETASONE FURO-FORMOTEROL FUM 200-5 MCG/ACT IN AERO
2.0000 | INHALATION_SPRAY | Freq: Two times a day (BID) | RESPIRATORY_TRACT | Status: DC
  Administered 2016-02-05 – 2016-02-07 (×4): 2 via RESPIRATORY_TRACT
  Filled 2016-02-05: qty 8.8

## 2016-02-05 MED ORDER — SODIUM CHLORIDE 0.9% FLUSH
3.0000 mL | Freq: Two times a day (BID) | INTRAVENOUS | Status: DC
  Administered 2016-02-05 – 2016-02-07 (×4): 3 mL via INTRAVENOUS

## 2016-02-05 MED ORDER — POLYETHYLENE GLYCOL 3350 17 G PO PACK: 17.0000 g | PACK | Freq: Every day | ORAL | Status: DC | PRN

## 2016-02-05 MED ORDER — POTASSIUM CHLORIDE CRYS ER 20 MEQ PO TBCR
40.0000 meq | EXTENDED_RELEASE_TABLET | Freq: Once | ORAL | Status: AC
  Administered 2016-02-05: 40 meq via ORAL
  Filled 2016-02-05: qty 2

## 2016-02-05 MED ORDER — LATANOPROST 0.005 % OP SOLN
1.0000 [drp] | Freq: Every day | OPHTHALMIC | Status: DC
  Filled 2016-02-05: qty 2.5

## 2016-02-05 MED ORDER — BUSPIRONE HCL 5 MG PO TABS
7.5000 mg | ORAL_TABLET | Freq: Three times a day (TID) | ORAL | Status: DC
  Administered 2016-02-05 – 2016-02-07 (×6): 7.5 mg via ORAL
  Filled 2016-02-05 (×6): qty 2

## 2016-02-05 MED ORDER — AMLODIPINE BESYLATE 5 MG PO TABS
2.5000 mg | ORAL_TABLET | Freq: Every day | ORAL | Status: DC
  Administered 2016-02-05 – 2016-02-07 (×3): 2.5 mg via ORAL
  Filled 2016-02-05 (×3): qty 1

## 2016-02-05 MED ORDER — METHYLPREDNISOLONE 4 MG PO TBPK: 4.0000 mg | ORAL_TABLET | Freq: Three times a day (TID) | ORAL | Status: DC

## 2016-02-05 MED ORDER — LORATADINE 10 MG PO TABS
10.0000 mg | ORAL_TABLET | Freq: Every day | ORAL | Status: DC
  Administered 2016-02-05 – 2016-02-07 (×3): 10 mg via ORAL
  Filled 2016-02-05 (×3): qty 1

## 2016-02-05 MED ORDER — METHYLPREDNISOLONE 4 MG PO TBPK: 4.0000 mg | ORAL_TABLET | Freq: Every day | ORAL | Status: DC

## 2016-02-05 MED ORDER — DONEPEZIL HCL 5 MG PO TABS
10.0000 mg | ORAL_TABLET | Freq: Every day | ORAL | Status: DC
  Administered 2016-02-05 – 2016-02-06 (×2): 10 mg via ORAL
  Filled 2016-02-05 (×2): qty 2

## 2016-02-05 MED ORDER — SERTRALINE HCL 50 MG PO TABS
150.0000 mg | ORAL_TABLET | Freq: Every day | ORAL | Status: DC
  Administered 2016-02-05 – 2016-02-07 (×3): 150 mg via ORAL
  Filled 2016-02-05 (×3): qty 3

## 2016-02-05 MED ORDER — METHYLPREDNISOLONE 4 MG PO TABS
4.0000 mg | ORAL_TABLET | Freq: Every day | ORAL | Status: AC
  Administered 2016-02-05 – 2016-02-06 (×2): 4 mg via ORAL
  Filled 2016-02-05 (×2): qty 1

## 2016-02-05 NOTE — H&P (Signed)
History and Physical    [name]:[id number] DOB: 01-May-1935 DOA: 02/05/2016  PCP: Lilyan Punt, MD  Patient coming from: home  Chief Complaint: shortness of breath  HPI: [name] is a 80 y.o. female with medical history significant of CHF, COPD, chronic respiratory failure on 4-5 L of oxygen, chronic kidney disease stage III. Patient reports over the past several days she's had worsening shortness of breath. She had seen her cardiologist who had increased her Lasix dosing. She reports that her weight had trended down since increasing her Lasix, but her shortness of breath has progressively gotten worse. She denies any chest pain. Shortness of breath got worse overnight prompting ER visit this morning. No fever, cough, dysuria, vomiting or diarrhea. Both family and patient admits to noncompliance with fluid intake and salt intake. Review of chart indicates that there was some talks of initiating hospice, but the patient is unaware of this.   ED Course: Chest x-ray indicated enlarging left pleural effusion. She was treated with IV Lasix as well as nebulizer treatments with improvement of her breathing. She's been referred for admission.  Review of Systems: As per HPI otherwise 10 point review of systems negative.    Past Medical History:  Diagnosis Date  . Acute on chronic diastolic heart failure (HCC) 04/11/2014  . Asthma   . Bradycardia   . CAD (coronary artery disease) 08/2009   s/p PCI of the LAD  . Carotid artery stenosis   . Chronic headache   . COPD (chronic obstructive pulmonary disease) (HCC)   . Dementia    patient denies this  . Hyperlipidemia   . Hypertension   . Impaired fasting glucose   . Myocardial infarction    Non-st segment elevated  . On home O2   . Osteoporosis 2009  . Pneumonia   . Seasonal allergies   . Ulnar neuropathy     Past Surgical History:  Procedure Laterality Date  . BLADDER SURGERY  1991  . CARDIAC CATHETERIZATION  11/2010    negative  . CATARACT EXTRACTION  Nov 2016  . LEFT HEART CATHETERIZATION WITH CORONARY ANGIOGRAM N/A 11/05/2012   Procedure: LEFT HEART CATHETERIZATION WITH CORONARY ANGIOGRAM;  Surgeon: Micheline Chapman, MD;  Location: [facility] CATH LAB;  Service: Cardiovascular;  Laterality: N/A;  . PARTIAL HYSTERECTOMY  1984  . Radial artery catheter     Bladder     reports that she quit smoking about 7 years ago. She has a 35.00 pack-year smoking history. She has never used smokeless tobacco. She reports that she does not drink alcohol or use drugs.  Allergies  Allergen Reactions  . Codeine Nausea And Vomiting  . Dilaudid [Hydromorphone Hcl] Other (See Comments)    Extremely sensitive  . Erythromycin Other (See Comments)    Intense stomach pain  . Sulfonamide Derivatives     unknown    Family History  Problem Relation Age of Onset  . Addison's disease Mother   . Alcohol abuse Father     Prior to Admission medications   Medication Sig Start Date End Date Taking? Authorizing Provider  albuterol (PROVENTIL HFA;VENTOLIN HFA) 108 (90 Base) MCG/ACT inhaler Inhale 2 puffs into the lungs 3 (three) times daily. 09/07/15  Yes Babs Sciara, MD  albuterol (PROVENTIL) (2.5 MG/3ML) 0.083% nebulizer solution Take 3 mLs (2.5 mg total) by nebulization every 4 (four) hours as needed for wheezing or shortness of breath. 10/12/15  Yes Babs Sciara, MD  alendronate (FOSAMAX) 70 MG tablet  TAKE 1 TABLET BY MOUTH 1 TIME WEEKLY AS DIRECTED 11/06/15  Yes Babs Sciara, MD  amLODipine (NORVASC) 5 MG tablet TAKE 1/2 TABLET BY MOUTH DAILY 07/09/15  Yes Babs Sciara, MD  Ascorbic Acid (VITAMIN C PO) Take 1 tablet by mouth daily.   Yes Historical Provider, MD  aspirin EC 81 MG tablet Take 81 mg by mouth daily.   Yes Historical Provider, MD  budesonide-formoterol (SYMBICORT) 160-4.5 MCG/ACT inhaler Inhale 2 puffs into the lungs 2 (two) times daily. 08/28/13  Yes Babs Sciara, MD  busPIRone (BUSPAR) 7.5 MG tablet Take 7.5 mg  by mouth 3 (three) times daily.   Yes Historical Provider, MD  Calcium Carbonate (CALCIUM 600 PO) Take 1 tablet by mouth daily.    Yes Historical Provider, MD  cetirizine (ZYRTEC) 10 MG tablet Take 10 mg by mouth daily.   Yes Historical Provider, MD  Cholecalciferol (VITAMIN D3) 1000 UNITS CAPS Take 1 capsule by mouth daily.   Yes Historical Provider, MD  donepezil (ARICEPT) 10 MG tablet TAKE 1 TABLET BY MOUTH EVERY NIGHT AT BEDTIME 09/17/15  Yes Babs Sciara, MD  furosemide (LASIX) 20 MG tablet Take 3 tablets (60 mg total) by mouth daily. 02/02/16  Yes Rosalio Macadamia, NP  ipratropium (ATROVENT) 0.02 % nebulizer solution Take 2.5 mLs (0.5 mg total) by nebulization every 6 (six) hours as needed for wheezing or shortness of breath. 01/15/16  Yes Meredeth Ide, MD  isosorbide mononitrate (IMDUR) 30 MG 24 hr tablet Take 0.5 tablets (15 mg total) by mouth daily. 08/10/15  Yes Rosalio Macadamia, NP  LORazepam (ATIVAN) 1 MG tablet TAKE 1/2 TABLET TO 1 TABLET BY MOUTH EVERY NIGHT AT BEDTIME AS NEEDED 09/30/15  Yes Merlyn Albert, MD  losartan (COZAAR) 25 MG tablet Take 1 tablet (25 mg total) by mouth daily. 01/16/16  Yes Meredeth Ide, MD  methylPREDNISolone (MEDROL DOSEPAK) 4 MG TBPK tablet Take 4 mg by mouth. As directed 02/02/16  Yes Historical Provider, MD  metoprolol tartrate (LOPRESSOR) 25 MG tablet Take 1 tablet (25 mg total) by mouth 2 (two) times daily. 01/15/16  Yes Meredeth Ide, MD  nitroGLYCERIN (NITROSTAT) 0.4 MG SL tablet Place 1 tablet (0.4 mg total) under the tongue as needed for chest pain. 12/18/14  Yes Babs Sciara, MD  omeprazole (PRILOSEC) 20 MG capsule Take 20 mg by mouth daily.   Yes Historical Provider, MD  ondansetron (ZOFRAN) 8 MG tablet Take 1 tablet (8 mg total) by mouth every 8 (eight) hours as needed for nausea. 12/08/15  Yes Babs Sciara, MD  polyethylene glycol (MIRALAX / GLYCOLAX) packet Take 17 g by mouth daily as needed for moderate constipation. 04/16/14  Yes Elliot Cousin, MD    pravastatin (PRAVACHOL) 40 MG tablet TAKE 1 TABLET BY MOUTH EVERY DAY 11/06/15  Yes Babs Sciara, MD  ranitidine (ZANTAC) 150 MG tablet Take 150 mg by mouth daily as needed for heartburn.   Yes Historical Provider, MD  sertraline (ZOLOFT) 100 MG tablet TAKE 1& 1/2 TABLETS BY MOUTH DAILY 01/19/16  Yes Babs Sciara, MD  Simethicone (GAS RELIEF PO) Take 1 tablet by mouth daily as needed (gas relief).   Yes Historical Provider, MD  TRAVATAN Z 0.004 % SOLN ophthalmic solution Place 1 drop into the left eye at bedtime.  10/08/14  Yes Historical Provider, MD    Physical Exam: Vitals:   02/05/16 1215 02/05/16 1230 02/05/16 1300 02/05/16 1315  BP:  169/70  169/70   Pulse: 69 68 75 66  Resp: 23 24 18 24   Temp:   97.9 F (36.6 C)   TempSrc:   Oral   SpO2: 96% 98% 99% 99%  Weight:      Height:          Constitutional: NAD, calm, comfortable Vitals:   02/05/16 1215 02/05/16 1230 02/05/16 1300 02/05/16 1315  BP:  169/70 169/70   Pulse: 69 68 75 66  Resp: 23 24 18 24   Temp:   97.9 F (36.6 C)   TempSrc:   Oral   SpO2: 96% 98% 99% 99%  Weight:      Height:       Eyes: PERRL, lids and conjunctivae normal ENMT: Mucous membranes are moist. Posterior pharynx clear of any exudate or lesions.Normal dentition.  Neck: normal, supple, no masses, no thyromegaly Respiratory: diminished breath sounds at left base. Normal respiratory effort. No accessory muscle use.  Cardiovascular: Regular rate and rhythm, no murmurs / rubs / gallops. No extremity edema. 2+ pedal pulses. No carotid bruits.  Abdomen: no tenderness, no masses palpated. No hepatosplenomegaly. Bowel sounds positive.  Musculoskeletal: no clubbing / cyanosis. No joint deformity upper and lower extremities. Good ROM, no contractures. Normal muscle tone.  Skin: no rashes, lesions, ulcers. No induration Neurologic: CN 2-12 grossly intact. Sensation intact, DTR normal. Strength 5/5 in all 4.  Psychiatric: Normal judgment and insight. Alert  and oriented x 3. Normal mood.   Labs on Admission: I have personally reviewed following labs and imaging studies  CBC:  Recent Labs Lab 02/02/16 1036 02/05/16 0921  WBC 6.6 6.9  HGB 9.4* 10.3*  HCT 29.4* 31.9*  MCV 85.0 85.5  PLT 181 207   Basic Metabolic Panel:  Recent Labs Lab 02/02/16 1036 02/05/16 0921  NA 142 139  K 4.2 3.4*  CL 109 103  CO2 23 26  GLUCOSE 124* 138*  BUN 22 29*  CREATININE 1.40* 1.33*  CALCIUM 7.8* 8.9   GFR: Estimated Creatinine Clearance: 34.6 mL/min (by C-G formula based on SCr of 1.33 mg/dL (H)). Liver Function Tests: No results for input(s): AST, ALT, ALKPHOS, BILITOT, PROT, ALBUMIN in the last 168 hours. No results for input(s): LIPASE, AMYLASE in the last 168 hours. No results for input(s): AMMONIA in the last 168 hours. Coagulation Profile: No results for input(s): INR, PROTIME in the last 168 hours. Cardiac Enzymes:  Recent Labs Lab 02/05/16 0921  TROPONINI 0.04*   BNP (last 3 results) No results for input(s): PROBNP in the last 8760 hours. HbA1C: No results for input(s): HGBA1C in the last 72 hours. CBG: No results for input(s): GLUCAP in the last 168 hours. Lipid Profile: No results for input(s): CHOL, HDL, LDLCALC, TRIG, CHOLHDL, LDLDIRECT in the last 72 hours. Thyroid Function Tests: No results for input(s): TSH, T4TOTAL, FREET4, T3FREE, THYROIDAB in the last 72 hours. Anemia Panel: No results for input(s): VITAMINB12, FOLATE, FERRITIN, TIBC, IRON, RETICCTPCT in the last 72 hours. Urine analysis:    Component Value Date/Time   COLORURINE YELLOW 12/03/2015 1010   APPEARANCEUR CLEAR 12/03/2015 1010   LABSPEC 1.025 12/03/2015 1010   PHURINE 6.0 12/03/2015 1010   GLUCOSEU NEGATIVE 12/03/2015 1010   HGBUR NEGATIVE 12/03/2015 1010   BILIRUBINUR NEGATIVE 12/03/2015 1010   KETONESUR NEGATIVE 12/03/2015 1010   PROTEINUR NEGATIVE 12/03/2015 1010   UROBILINOGEN 0.2 04/13/2014 1200   NITRITE NEGATIVE 12/03/2015 1010    LEUKOCYTESUR NEGATIVE 12/03/2015 1010   Sepsis Labs: !!!!!!!!!!!!!!!!!!!!!!!!!!!!!!!!!!!!!!!!!!!! @LABRCNTIP (procalcitonin:4,lacticidven:4) )No results found  for this or any previous visit (from the past 240 hour(s)).   Radiological Exams on Admission: Dg Chest 2 View  Result Date: 02/05/2016 CLINICAL DATA:  Shortness of breath, worsening EXAM: CHEST  2 VIEW COMPARISON:  01/13/2016 FINDINGS: Moderate left pleural effusion which has increased compared with 01/13/2016. No right pleural effusion. No pneumothorax. No focal consolidation. Stable cardiomegaly. Mild bilateral interstitial thickening. Multiple healing right anterolateral rib fractures.  A IMPRESSION: 1. Moderate left pleural effusion which has increased in size compared with 01/13/2016. 2. Cardiomegaly with mild pulmonary vascular congestion. Electronically Signed   By: Elige Ko   On: 02/05/2016 10:39    EKG: Independently reviewed. Sinus rhythm, without acute changes  Assessment/Plan Active Problems:   Essential hypertension   Acute on chronic diastolic heart failure (HCC)   CKD (chronic kidney disease) stage 3, GFR 30-59 ml/min   Chronic respiratory failure (HCC)   COPD (chronic obstructive pulmonary disease) (HCC)   CHF (congestive heart failure) (HCC)   1. Acute on chronic diastolic congestive heart failure. Patient has responded to intravenous Lasix. Will continue IV diuretics. She is also on ARB and beta blocker.  2. COPD. She is currently completing a steroid taper. She does not have any evidence of wheezing at this time. Will continue bronchodilators.  3. Chronic respiratory failure. Chronically on 4-5 L of oxygen. Currently at her baseline. Continue to monitor  4. Hypertension. Currently appears stable. Continue outpatient regimen.  5. CKD stage III. Baseline creatinine of 1.1. Creatinine is currently stable. Continue monitoring the setting of diuresis.  DVT prophylaxis: lovenox Code Status: DNR Family  Communication: discussed with daughters at the bedside Disposition Plan: discharge home once improved Consults called:  Admission status: inpatient, tele   Neesa Knapik MD [facility] Pager [phone]  If 7PM-7AM, please contact night-coverage [email or web address] Password [email or web address]  02/05/2016, 4:18 PM

## 2016-02-05 NOTE — ED Triage Notes (Signed)
Pt reports sob x2 days with cp in right side of chest.  Pt was seen by cardiology on Tuesday and had blood work done and was told to increase 02 to 5L/min.  Pt had breathing tx at 0600 today.  Pt alert and oriented at this time.

## 2016-02-05 NOTE — ED Provider Notes (Signed)
[facility]-EMERGENCY DEPT Provider Note   CSN: [id number] Arrival date & time: 02/05/16  4098  By signing my name below, I, [name] and [name] , attest that this documentation has been prepared under the direction and in the presence of Benjiman Core, MD. Electronically Signed: Talbert Cummings and [name] , Scribe. 02/05/16. 9:24 AM.    History   Chief Complaint Chief Complaint  Patient presents with  . Shortness of Breath    HPI HPI Comments: [name] is a 80 y.o. female with h/o CHF and COPD who presents to the Emergency Department complaining of worsening shortness of breath that began this week. She has associated dry cough, dull left-sided chest pain.  Pt states her SOB is exacerbated by any exertion. She states she tried a breathing treatment PTA with no relief. Pt was seen by her cardiologist on 02/02/16 at which time her dose of Lasix was increased from 20 mg to 60 mg and she was placed on a prenisone taper. Per pt, she has lost approximately 5 lbs since this increase in dosage of Lasix. Family reports that they have since increased the pts O2 at home from 4L to 5L with no significant relief of symptoms. She denies fever.   The history is provided by the patient and a relative. No language interpreter was used.    Past Medical History:  Diagnosis Date  . Acute on chronic diastolic heart failure (HCC) 04/11/2014  . Asthma   . Bradycardia   . CAD (coronary artery disease) 08/2009   s/p PCI of the LAD  . Carotid artery stenosis   . Chronic headache   . COPD (chronic obstructive pulmonary disease) (HCC)   . Dementia    patient denies this  . Hyperlipidemia   . Hypertension   . Impaired fasting glucose   . Myocardial infarction    Non-st segment elevated  . On home O2   . Osteoporosis 2009  . Pneumonia   . Seasonal allergies   . Ulnar neuropathy     Patient Active Problem List   Diagnosis Date Noted  . Acute on chronic diastolic congestive heart  failure (HCC) 01/13/2016  . Elevated troponin 01/13/2016  . Multiple fractures of ribs, right side, initial encounter for closed fracture 12/03/2015  . Rib fractures 12/03/2015  . Chronic respiratory failure (HCC) 12/03/2015  . COPD (chronic obstructive pulmonary disease) (HCC) 12/03/2015  . Senile purpura (HCC) 09/07/2015  . Benign essential tremor 09/07/2015  . Chronic diastolic congestive heart failure (HCC) 04/20/2015  . Renal insufficiency 04/25/2014  . Acute on chronic diastolic heart failure (HCC) 04/11/2014  . Acute on chronic respiratory failure (HCC) 04/11/2014  . CKD (chronic kidney disease) stage 3, GFR 30-59 ml/min 04/11/2014  . Prediabetes 12/03/2013  . Hypertrophic obstructive cardiomyopathy (HCC) 11/09/2013  . Hypoxia 08/28/2013  . Respiratory failure with hypoxia (HCC) 05/07/2013  . Acute diastolic CHF (congestive heart failure) (HCC) 05/07/2013  . COPD exacerbation (HCC) 02/15/2013  . Hyperlipidemia 11/09/2010  . Acute diastolic heart failure (HCC) 10/15/2010  . CAD (coronary artery disease) 09/18/2009  . BRADYCARDIA 08/19/2009  . Dementia 07/19/2007  . Essential hypertension 07/19/2007  . CAROTID ARTERY STENOSIS 07/19/2007  . ASTHMA 07/19/2007  . COPD with asthma (HCC) 07/19/2007  . HEADACHE, CHRONIC, HX OF 07/19/2007    Past Surgical History:  Procedure Laterality Date  . BLADDER SURGERY  1991  . CARDIAC CATHETERIZATION  11/2010   negative  . CATARACT EXTRACTION  Nov 2016  . LEFT HEART  CATHETERIZATION WITH CORONARY ANGIOGRAM N/A 11/05/2012   Procedure: LEFT HEART CATHETERIZATION WITH CORONARY ANGIOGRAM;  Surgeon: Micheline Chapman, MD;  Location: [facility] CATH LAB;  Service: Cardiovascular;  Laterality: N/A;  . PARTIAL HYSTERECTOMY  1984  . Radial artery catheter     Bladder    OB History    Gravida Para Term Preterm AB Living             3   SAB TAB Ectopic Multiple Live Births                   Home Medications    Prior to Admission medications     Medication Sig Start Date End Date Taking? Authorizing Provider  albuterol (PROVENTIL HFA;VENTOLIN HFA) 108 (90 Base) MCG/ACT inhaler Inhale 2 puffs into the lungs 3 (three) times daily. 09/07/15  Yes Babs Sciara, MD  albuterol (PROVENTIL) (2.5 MG/3ML) 0.083% nebulizer solution Take 3 mLs (2.5 mg total) by nebulization every 4 (four) hours as needed for wheezing or shortness of breath. 10/12/15  Yes Babs Sciara, MD  alendronate (FOSAMAX) 70 MG tablet TAKE 1 TABLET BY MOUTH 1 TIME WEEKLY AS DIRECTED 11/06/15  Yes Babs Sciara, MD  amLODipine (NORVASC) 5 MG tablet TAKE 1/2 TABLET BY MOUTH DAILY 07/09/15  Yes Babs Sciara, MD  Ascorbic Acid (VITAMIN C PO) Take 1 tablet by mouth daily.   Yes Historical Provider, MD  aspirin EC 81 MG tablet Take 81 mg by mouth daily.   Yes Historical Provider, MD  budesonide-formoterol (SYMBICORT) 160-4.5 MCG/ACT inhaler Inhale 2 puffs into the lungs 2 (two) times daily. 08/28/13  Yes Babs Sciara, MD  busPIRone (BUSPAR) 7.5 MG tablet Take 7.5 mg by mouth 3 (three) times daily.   Yes Historical Provider, MD  Calcium Carbonate (CALCIUM 600 PO) Take 1 tablet by mouth daily.    Yes Historical Provider, MD  cetirizine (ZYRTEC) 10 MG tablet Take 10 mg by mouth daily.   Yes Historical Provider, MD  Cholecalciferol (VITAMIN D3) 1000 UNITS CAPS Take 1 capsule by mouth daily.   Yes Historical Provider, MD  donepezil (ARICEPT) 10 MG tablet TAKE 1 TABLET BY MOUTH EVERY NIGHT AT BEDTIME 09/17/15  Yes Babs Sciara, MD  furosemide (LASIX) 20 MG tablet Take 3 tablets (60 mg total) by mouth daily. 02/02/16  Yes Rosalio Macadamia, NP  ipratropium (ATROVENT) 0.02 % nebulizer solution Take 2.5 mLs (0.5 mg total) by nebulization every 6 (six) hours as needed for wheezing or shortness of breath. 01/15/16  Yes Meredeth Ide, MD  isosorbide mononitrate (IMDUR) 30 MG 24 hr tablet Take 0.5 tablets (15 mg total) by mouth daily. 08/10/15  Yes Rosalio Macadamia, NP  LORazepam (ATIVAN) 1 MG tablet  TAKE 1/2 TABLET TO 1 TABLET BY MOUTH EVERY NIGHT AT BEDTIME AS NEEDED 09/30/15  Yes Merlyn Albert, MD  losartan (COZAAR) 25 MG tablet Take 1 tablet (25 mg total) by mouth daily. 01/16/16  Yes Meredeth Ide, MD  methylPREDNISolone (MEDROL DOSEPAK) 4 MG TBPK tablet Take 4 mg by mouth. As directed 02/02/16  Yes Historical Provider, MD  metoprolol tartrate (LOPRESSOR) 25 MG tablet Take 1 tablet (25 mg total) by mouth 2 (two) times daily. 01/15/16  Yes Meredeth Ide, MD  nitroGLYCERIN (NITROSTAT) 0.4 MG SL tablet Place 1 tablet (0.4 mg total) under the tongue as needed for chest pain. 12/18/14  Yes Babs Sciara, MD  omeprazole (PRILOSEC) 20 MG capsule Take 20 mg  by mouth daily.   Yes Historical Provider, MD  ondansetron (ZOFRAN) 8 MG tablet Take 1 tablet (8 mg total) by mouth every 8 (eight) hours as needed for nausea. 12/08/15  Yes Babs Sciara, MD  polyethylene glycol (MIRALAX / GLYCOLAX) packet Take 17 g by mouth daily as needed for moderate constipation. 04/16/14  Yes Elliot Cousin, MD  pravastatin (PRAVACHOL) 40 MG tablet TAKE 1 TABLET BY MOUTH EVERY DAY 11/06/15  Yes Babs Sciara, MD  ranitidine (ZANTAC) 150 MG tablet Take 150 mg by mouth daily as needed for heartburn.   Yes Historical Provider, MD  sertraline (ZOLOFT) 100 MG tablet TAKE 1& 1/2 TABLETS BY MOUTH DAILY 01/19/16  Yes Babs Sciara, MD  Simethicone (GAS RELIEF PO) Take 1 tablet by mouth daily as needed (gas relief).   Yes Historical Provider, MD  TRAVATAN Z 0.004 % SOLN ophthalmic solution Place 1 drop into the left eye at bedtime.  10/08/14  Yes Historical Provider, MD    Family History Family History  Problem Relation Age of Onset  . Addison's disease Mother   . Alcohol abuse Father     Social History Social History  Substance Use Topics  . Smoking status: Former Smoker    Packs/day: 1.00    Years: 35.00    Quit date: 02/15/2008  . Smokeless tobacco: Never Used  . Alcohol use No     Allergies   Codeine; Dilaudid  [hydromorphone hcl]; Erythromycin; and Sulfonamide derivatives   Review of Systems Review of Systems  Constitutional: Negative for fever.  Respiratory: Positive for cough and shortness of breath.   Cardiovascular: Positive for chest pain.     Physical Exam Updated Vital Signs BP 157/63 (BP Location: Left Arm)   Pulse 68   Temp 97.9 F (36.6 C) (Oral)   Resp 22   Ht 5\' 3"  (1.6 m)   Wt 173 lb (78.5 kg)   SpO2 97%   BMI 30.65 kg/m   Physical Exam  Constitutional: She is oriented to person, place, and time. She appears well-developed.  HENT:  Head: Normocephalic and atraumatic.  Cardiovascular: Normal rate.   Pulmonary/Chest: Effort normal. She has wheezes.  Pursed lip breathing. Diffuse wheezing and prolong expirations.   Musculoskeletal: She exhibits edema.  Mild bilateral pitting edema.  Neurological: She is alert and oriented to person, place, and time.  Skin: Skin is warm and dry.  Psychiatric: She has a normal mood and affect.  Nursing note and vitals reviewed.    ED Treatments / Results   DIAGNOSTIC STUDIES: Oxygen Saturation is 90% on [place] 5L, low by my interpretation.    COORDINATION OF CARE: 9:14 AM Discussed treatment plan with pt at bedside and pt agreed to plan.    Labs (all labs ordered are listed, but only abnormal results are displayed) Labs Reviewed  BASIC METABOLIC PANEL - Abnormal; Notable for the following:       Result Value   Potassium 3.4 (*)    Glucose, Bld 138 (*)    BUN 29 (*)    Creatinine, Ser 1.33 (*)    GFR calc non Af Amer 37 (*)    GFR calc Af Amer 43 (*)    All other components within normal limits  CBC - Abnormal; Notable for the following:    RBC 3.73 (*)    Hemoglobin 10.3 (*)    HCT 31.9 (*)    All other components within normal limits  TROPONIN I - Abnormal; Notable for the  following:    Troponin I 0.04 (*)    All other components within normal limits  BRAIN NATRIURETIC PEPTIDE - Abnormal; Notable for the following:     B Natriuretic Peptide 1,173.0 (*)    All other components within normal limits  I-STAT TROPOININ, ED    EKG  EKG Interpretation None       Radiology Dg Chest 2 View  Result Date: 02/05/2016 CLINICAL DATA:  Shortness of breath, worsening EXAM: CHEST  2 VIEW COMPARISON:  01/13/2016 FINDINGS: Moderate left pleural effusion which has increased compared with 01/13/2016. No right pleural effusion. No pneumothorax. No focal consolidation. Stable cardiomegaly. Mild bilateral interstitial thickening. Multiple healing right anterolateral rib fractures.  A IMPRESSION: 1. Moderate left pleural effusion which has increased in size compared with 01/13/2016. 2. Cardiomegaly with mild pulmonary vascular congestion. Electronically Signed   By: Elige KoHetal  Patel   On: 02/05/2016 10:39    Procedures Procedures (including critical care time)  Medications Ordered in ED Medications  ipratropium-albuterol (DUONEB) 0.5-2.5 (3) MG/3ML nebulizer solution 3 mL (3 mLs Nebulization Given 02/05/16 0955)  furosemide (LASIX) injection 80 mg (80 mg Intravenous Given 02/05/16 1133)     Initial Impression / Assessment and Plan / ED Course  I have reviewed the triage vital signs and the nursing notes.  Pertinent labs & imaging results that were available during my care of the patient were reviewed by me and considered in my medical decision making (see chart for details).  Clinical Course     Patient with shortness of breath. Likely due to COPD and CHF in combination. Has worsening pleural effusion. Has been talk of hospice in the past. This is likely a good idea since she has been worsening despite good attempted medical control. Will admit to internal medicine. Discussed with Dr. Kerry HoughMemon.  Final Clinical Impressions(s) / ED Diagnoses   Final diagnoses:  Congestive heart failure, unspecified congestive heart failure chronicity, unspecified congestive heart failure type (HCC)  End stage COPD (HCC)  Pleural  effusion    New Prescriptions New Prescriptions   No medications on file   I personally performed the services described in this documentation, which was scribed in my presence. The recorded information has been reviewed and is accurate.        Benjiman CoreNathan Maleny Candy, MD 02/05/16 1150

## 2016-02-05 NOTE — Telephone Encounter (Signed)
S/w with grand daughter this am.  Pt could not breathe last night which seems to be an on going issue.  Pt's husband woke up on the middle of night to find pt blue and cold.  Gave pt 02 at 80 on 4L. Up to 6 L and two breathing treatments 02 went to 90. Grand daughter discussed with family members and agreed on Hospice consult.  S/w [name], NP, this am stated to put a referral in for hospice consult for DX: in stage COPD and Diastolic HF, was stated to have hospice doctor place all orders.  S/w Grand daughter again, pt wants to go to ER and does not want anyone in pt's house.  The original plan was not to go to hospital.  Will not place referral and pt's husband will drive pt to ER.

## 2016-02-05 NOTE — ED Notes (Signed)
Potty chair placed at bedside

## 2016-02-05 NOTE — ED Notes (Signed)
CRITICAL VALUE ALERT  Critical value received:  Troponin 0.04  Date of notification:  02/05/16  Time of notification:  1000  Critical value read back:Yes.    Nurse who received alert:  Fransico Him, RN  MD notified (1st page):  Dr. Rubin Payor  Time of first page:  1001  MD notified (2nd page):  Time of second page:  Responding MD:  Dr. Rubin Payor  Time MD responded:  1001

## 2016-02-05 NOTE — ED Notes (Signed)
Report given to [place], RN at this time for room 327.

## 2016-02-05 NOTE — ED Notes (Signed)
Attempted to call report, RN unavailable.

## 2016-02-06 LAB — BASIC METABOLIC PANEL
Anion gap: 11 (ref 5–15)
BUN: 30 mg/dL — AB (ref 6–20)
CALCIUM: 8.9 mg/dL (ref 8.9–10.3)
CO2: 29 mmol/L (ref 22–32)
CREATININE: 1.38 mg/dL — AB (ref 0.44–1.00)
Chloride: 99 mmol/L — ABNORMAL LOW (ref 101–111)
GFR calc non Af Amer: 36 mL/min — ABNORMAL LOW (ref >60)
GFR, EST AFRICAN AMERICAN: 41 mL/min — AB (ref >60)
Glucose, Bld: 127 mg/dL — ABNORMAL HIGH (ref 65–99)
Potassium: 3.5 mmol/L (ref 3.5–5.1)
SODIUM: 139 mmol/L (ref 135–145)

## 2016-02-06 MED ORDER — DIPHENHYDRAMINE HCL 25 MG PO CAPS
25.0000 mg | ORAL_CAPSULE | Freq: Once | ORAL | Status: AC
  Administered 2016-02-06: 25 mg via ORAL
  Filled 2016-02-06: qty 1

## 2016-02-06 MED ORDER — MENTHOL 3 MG MT LOZG
1.0000 | LOZENGE | OROMUCOSAL | Status: DC | PRN
  Administered 2016-02-06: 3 mg via ORAL
  Filled 2016-02-06: qty 9

## 2016-02-06 NOTE — Progress Notes (Signed)
During assessment, pt stated she did not sleep at all last night. She states that the Ativan she usually takes did not work. Notified on call. Awaiting response or orders.

## 2016-02-06 NOTE — Progress Notes (Signed)
PROGRESS NOTE    [name]  [id number] DOB: 13-Oct-1935 DOA: 02/05/2016 PCP: Lilyan PuntScott Luking, MD    Brief Narrative:  80 year old lady with history of CHF, COPD, chronic respiratory failure, chronic kidney disease stage III, came to the hospital with shortness of breath and found to have decompensated congestive heart failure. Started on IV Lasix with improvement.   Assessment & Plan:   Active Problems:   Essential hypertension   Acute on chronic diastolic heart failure (HCC)   CKD (chronic kidney disease) stage 3, GFR 30-59 ml/min   Chronic respiratory failure (HCC)   COPD (chronic obstructive pulmonary disease) (HCC)   CHF (congestive heart failure) (HCC)   1. Acute on chronic diastolic congestive heart failure. Patient has responded to intravenous Lasix. Will continue IV diuretics. She is also on ARB and beta blocker.  2. COPD. She is currently completing a steroid taper. She does not have any evidence of wheezing at this time. Will continue bronchodilators.  3. Chronic respiratory failure. Chronically on 4-5 L of oxygen. Currently at her baseline. Continue to monitor  4. Hypertension. Currently appears stable. Continue outpatient regimen.  5. CKD stage III. Baseline creatinine of 1.1. Creatinine is currently stable. Continue monitoring the setting of diuresis.   DVT prophylaxis: lovenox Code Status: DNR Family Communication: discussed with daughter at the bedside Disposition Plan: discharge home once improved   Consultants:      Procedures:   Antimicrobials:      Subjective: Shortness of breath improving.  Objective: Vitals:   02/06/16 0905 02/06/16 1422 02/06/16 1520 02/06/16 1540  BP:    (!) 145/55  Pulse:   68 67  Resp:   18   Temp:   98.6 F (37 C)   TempSrc:   Oral   SpO2: 94% 96% 95%   Weight:      Height:        Intake/Output Summary (Last 24 hours) at 02/06/16 1758 Last data filed at 02/06/16 1632  Gross per 24 hour    Intake              480 ml  Output              100 ml  Net              380 ml   Filed Weights   02/05/16 0904 02/05/16 1752 02/06/16 0711  Weight: 78.5 kg (173 lb) 76.7 kg (169 lb 1.6 oz) 74.6 kg (164 lb 6.4 oz)    Examination:  General exam: Appears calm and comfortable  Respiratory system: improving breath sounds at left base. Respiratory effort normal. Cardiovascular system: S1 & S2 heard, RRR. No JVD, murmurs, rubs, gallops or clicks. No pedal edema. Gastrointestinal system: Abdomen is nondistended, soft and nontender. No organomegaly or masses felt. Normal bowel sounds heard. Central nervous system: Alert and oriented. No focal neurological deficits. Extremities: Symmetric 5 x 5 power. Skin: No rashes, lesions or ulcers Psychiatry: Judgement and insight appear normal. Mood & affect appropriate.     Data Reviewed: I have personally reviewed following labs and imaging studies  CBC:  Recent Labs Lab 02/02/16 1036 02/05/16 0921  WBC 6.6 6.9  HGB 9.4* 10.3*  HCT 29.4* 31.9*  MCV 85.0 85.5  PLT 181 207   Basic Metabolic Panel:  Recent Labs Lab 02/02/16 1036 02/05/16 0921 02/06/16 0641  NA 142 139 139  K 4.2 3.4* 3.5  CL 109 103 99*  CO2 23 26 29   GLUCOSE 124* 138*  127*  BUN 22 29* 30*  CREATININE 1.40* 1.33* 1.38*  CALCIUM 7.8* 8.9 8.9   GFR: Estimated Creatinine Clearance: 32.5 mL/min (by C-G formula based on SCr of 1.38 mg/dL (H)). Liver Function Tests: No results for input(s): AST, ALT, ALKPHOS, BILITOT, PROT, ALBUMIN in the last 168 hours. No results for input(s): LIPASE, AMYLASE in the last 168 hours. No results for input(s): AMMONIA in the last 168 hours. Coagulation Profile: No results for input(s): INR, PROTIME in the last 168 hours. Cardiac Enzymes:  Recent Labs Lab 02/05/16 0921  TROPONINI 0.04*   BNP (last 3 results) No results for input(s): PROBNP in the last 8760 hours. HbA1C: No results for input(s): HGBA1C in the last 72  hours. CBG: No results for input(s): GLUCAP in the last 168 hours. Lipid Profile: No results for input(s): CHOL, HDL, LDLCALC, TRIG, CHOLHDL, LDLDIRECT in the last 72 hours. Thyroid Function Tests: No results for input(s): TSH, T4TOTAL, FREET4, T3FREE, THYROIDAB in the last 72 hours. Anemia Panel: No results for input(s): VITAMINB12, FOLATE, FERRITIN, TIBC, IRON, RETICCTPCT in the last 72 hours. Sepsis Labs: No results for input(s): PROCALCITON, LATICACIDVEN in the last 168 hours.  No results found for this or any previous visit (from the past 240 hour(s)).       Radiology Studies: Dg Chest 2 View  Result Date: 02/05/2016 CLINICAL DATA:  Shortness of breath, worsening EXAM: CHEST  2 VIEW COMPARISON:  01/13/2016 FINDINGS: Moderate left pleural effusion which has increased compared with 01/13/2016. No right pleural effusion. No pneumothorax. No focal consolidation. Stable cardiomegaly. Mild bilateral interstitial thickening. Multiple healing right anterolateral rib fractures.  A IMPRESSION: 1. Moderate left pleural effusion which has increased in size compared with 01/13/2016. 2. Cardiomegaly with mild pulmonary vascular congestion. Electronically Signed   By: Elige Ko   On: 02/05/2016 10:39        Scheduled Meds: . amLODipine  2.5 mg Oral Daily  . aspirin EC  81 mg Oral Daily  . busPIRone  7.5 mg Oral TID  . donepezil  10 mg Oral QHS  . enoxaparin (LOVENOX) injection  40 mg Subcutaneous Q24H  . furosemide  60 mg Intravenous BID  . ipratropium-albuterol  3 mL Nebulization Q6H  . isosorbide mononitrate  15 mg Oral Daily  . loratadine  10 mg Oral Daily  . losartan  25 mg Oral Daily  . methylPREDNISolone  4 mg Oral QAC breakfast  . methylPREDNISolone  4 mg Oral QHS  . metoprolol tartrate  25 mg Oral BID  . mometasone-formoterol  2 puff Inhalation BID  . pantoprazole  40 mg Oral Daily  . pravastatin  40 mg Oral Daily  . sertraline  150 mg Oral Daily  . sodium chloride  flush  3 mL Intravenous Q12H   Continuous Infusions:   LOS: 1 day    Time spent:    Kyndal Gloster, MD [facility] Pager [phone]  If 7PM-7AM, please contact night-coverage [email or web address] Password [facility] 02/06/2016, 5:58 PM

## 2016-02-07 LAB — BASIC METABOLIC PANEL
Anion gap: 10 (ref 5–15)
BUN: 32 mg/dL — AB (ref 6–20)
CALCIUM: 8.3 mg/dL — AB (ref 8.9–10.3)
CO2: 30 mmol/L (ref 22–32)
CREATININE: 1.43 mg/dL — AB (ref 0.44–1.00)
Chloride: 97 mmol/L — ABNORMAL LOW (ref 101–111)
GFR calc Af Amer: 40 mL/min — ABNORMAL LOW (ref >60)
GFR, EST NON AFRICAN AMERICAN: 34 mL/min — AB (ref >60)
Glucose, Bld: 138 mg/dL — ABNORMAL HIGH (ref 65–99)
Potassium: 3 mmol/L — ABNORMAL LOW (ref 3.5–5.1)
SODIUM: 137 mmol/L (ref 135–145)

## 2016-02-07 MED ORDER — FUROSEMIDE 80 MG PO TABS: 80.0000 mg | ORAL_TABLET | Freq: Every day | ORAL | 0 refills | Status: DC

## 2016-02-07 MED ORDER — POTASSIUM CHLORIDE CRYS ER 20 MEQ PO TBCR
40.0000 meq | EXTENDED_RELEASE_TABLET | ORAL | Status: AC
Start: 2016-02-07 — End: 2016-02-07
  Administered 2016-02-07 (×2): 40 meq via ORAL
  Filled 2016-02-07 (×2): qty 2

## 2016-02-07 NOTE — Discharge Summary (Signed)
Physician Discharge Summary  [name] [id number] DOB: Sep 20, 1935 DOA: 02/05/2016  PCP: Lilyan PuntScott Luking, MD  Admit date: 02/05/2016 Discharge date: 02/07/2016  Admitted From: home Disposition:  home  Recommendations for Outpatient Follow-up:  1. Follow up with PCP in 1-2 weeks 2. Please obtain BMP/CBC in one week 3. Repeat chest xray in 3-4 weeks to follow up pleural effusion 4. Follow up with cardiology in 1-2 weeks  [facility] Equipment/Devices:  Discharge Condition: stable CODE STATUS: DNR Diet recommendation: Heart Healthy  Brief/Interim Summary: 80 year old lady with history of CHF, COPD, chronic respiratory failure, chronic kidney disease stage III, came to the hospital with shortness of breath and found to have decompensated congestive heart failure. Started on IV Lasix with improvement.  Discharge Diagnoses:  Active Problems:   Essential hypertension   Acute on chronic diastolic heart failure (HCC)   CKD (chronic kidney disease) stage 3, GFR 30-59 ml/min   Chronic respiratory failure (HCC)   COPD (chronic obstructive pulmonary disease) (HCC)   CHF (congestive heart failure) (HCC)  1. Acute on chronic diastolic congestive heart failure. Patient has responded to intravenous Lasix. Shortness of breath has improved. Creatinine is mildly trending up. Will transition back to oral lasix. Discharge weight is 74kg. Patient admits to noncompliance with salt and fluid intake. She was educated on the importance of limiting fluid and salt. Home health has been arranged. She is also on ARB and beta blocker. She should follow up with cardiology in 1-2 weeks  2. COPD. She has completed a steroid taper. She does not have any evidence of wheezing at this time. Will continue bronchodilators.  3. Chronic respiratory failure. Chronically on 4-5 L of oxygen. Currently at she has been weaned down to 3L and is breathing comfortably. Continue to monitor  4. Hypertension.  Currently appears stable. Continued on outpatient regimen.  5. CKD stage III. Baseline creatinine of 1.1. Creatinine is mildly trending up with diuresis. Continue to follow as outpatient.   Discharge Instructions  Discharge Instructions    Diet - low sodium heart healthy    Complete by:  As directed    Increase activity slowly    Complete by:  As directed      Allergies as of 02/07/2016      Reactions   Codeine Nausea And Vomiting   Dilaudid [hydromorphone Hcl] Other (See Comments)   Extremely sensitive   Erythromycin Other (See Comments)   Intense stomach pain   Sulfonamide Derivatives    unknown      Medication List    STOP taking these medications   methylPREDNISolone 4 MG Tbpk tablet Commonly known as:  MEDROL DOSEPAK   TRAVATAN Z 0.004 % Soln ophthalmic solution Generic drug:  Travoprost (BAK Free)     TAKE these medications   albuterol 108 (90 Base) MCG/ACT inhaler Commonly known as:  PROVENTIL HFA;VENTOLIN HFA Inhale 2 puffs into the lungs 3 (three) times daily.   albuterol (2.5 MG/3ML) 0.083% nebulizer solution Commonly known as:  PROVENTIL Take 3 mLs (2.5 mg total) by nebulization every 4 (four) hours as needed for wheezing or shortness of breath.   alendronate 70 MG tablet Commonly known as:  FOSAMAX TAKE 1 TABLET BY MOUTH 1 TIME WEEKLY AS DIRECTED   amLODipine 5 MG tablet Commonly known as:  NORVASC TAKE 1/2 TABLET BY MOUTH DAILY   aspirin EC 81 MG tablet Take 81 mg by mouth daily.   budesonide-formoterol 160-4.5 MCG/ACT inhaler Commonly known as:  SYMBICORT Inhale 2 puffs  into the lungs 2 (two) times daily.   busPIRone 7.5 MG tablet Commonly known as:  BUSPAR Take 7.5 mg by mouth 3 (three) times daily.   CALCIUM 600 PO Take 1 tablet by mouth daily.   cetirizine 10 MG tablet Commonly known as:  ZYRTEC Take 10 mg by mouth daily.   donepezil 10 MG tablet Commonly known as:  ARICEPT TAKE 1 TABLET BY MOUTH EVERY NIGHT AT BEDTIME    furosemide 80 MG tablet Commonly known as:  LASIX Take 1 tablet (80 mg total) by mouth daily. What changed:  medication strength  how much to take   GAS RELIEF PO Take 1 tablet by mouth daily as needed (gas relief).   ipratropium 0.02 % nebulizer solution Commonly known as:  ATROVENT Take 2.5 mLs (0.5 mg total) by nebulization every 6 (six) hours as needed for wheezing or shortness of breath.   isosorbide mononitrate 30 MG 24 hr tablet Commonly known as:  IMDUR Take 0.5 tablets (15 mg total) by mouth daily.   LORazepam 1 MG tablet Commonly known as:  ATIVAN TAKE 1/2 TABLET TO 1 TABLET BY MOUTH EVERY NIGHT AT BEDTIME AS NEEDED   losartan 25 MG tablet Commonly known as:  COZAAR Take 1 tablet (25 mg total) by mouth daily.   metoprolol tartrate 25 MG tablet Commonly known as:  LOPRESSOR Take 1 tablet (25 mg total) by mouth 2 (two) times daily.   nitroGLYCERIN 0.4 MG SL tablet Commonly known as:  NITROSTAT Place 1 tablet (0.4 mg total) under the tongue as needed for chest pain.   omeprazole 20 MG capsule Commonly known as:  PRILOSEC Take 20 mg by mouth daily.   ondansetron 8 MG tablet Commonly known as:  ZOFRAN Take 1 tablet (8 mg total) by mouth every 8 (eight) hours as needed for nausea.   polyethylene glycol packet Commonly known as:  MIRALAX / GLYCOLAX Take 17 g by mouth daily as needed for moderate constipation.   pravastatin 40 MG tablet Commonly known as:  PRAVACHOL TAKE 1 TABLET BY MOUTH EVERY DAY   ranitidine 150 MG tablet Commonly known as:  ZANTAC Take 150 mg by mouth daily as needed for heartburn.   sertraline 100 MG tablet Commonly known as:  ZOLOFT TAKE 1& 1/2 TABLETS BY MOUTH DAILY   VITAMIN C PO Take 1 tablet by mouth daily.   Vitamin D3 1000 units Caps Take 1 capsule by mouth daily.       Allergies  Allergen Reactions  . Codeine Nausea And Vomiting  . Dilaudid [Hydromorphone Hcl] Other (See Comments)    Extremely sensitive  .  Erythromycin Other (See Comments)    Intense stomach pain  . Sulfonamide Derivatives     unknown    Consultations:     Procedures/Studies: Dg Chest 2 View  Result Date: 02/05/2016 CLINICAL DATA:  Shortness of breath, worsening EXAM: CHEST  2 VIEW COMPARISON:  01/13/2016 FINDINGS: Moderate left pleural effusion which has increased compared with 01/13/2016. No right pleural effusion. No pneumothorax. No focal consolidation. Stable cardiomegaly. Mild bilateral interstitial thickening. Multiple healing right anterolateral rib fractures.  A IMPRESSION: 1. Moderate left pleural effusion which has increased in size compared with 01/13/2016. 2. Cardiomegaly with mild pulmonary vascular congestion. Electronically Signed   By: Elige Ko   On: 02/05/2016 10:39   Dg Chest 2 View  Result Date: 01/13/2016 CLINICAL DATA:  Chest congestion, soreness from coughing, LEFT side chest pain posteriorly extending to under LEFT breast, recent fall  on 12/03/2015 breaking RIGHT ribs, COPD, asthma, hypertension, former smoker EXAM: CHEST  2 VIEW COMPARISON:  12/03/2015 FINDINGS: Enlargement of cardiac silhouette with pulmonary vascular congestion. Atherosclerotic calcification aorta. Peribronchial thickening without definite pulmonary infiltrate, pleural effusion or pneumothorax. Extrapleural density at the lower lateral RIGHT chest suspect related to recent RIGHT rib fractures. Diffuse osseous demineralization with chronic compression fractures at T5 and T11 unchanged. IMPRESSION: Enlargement of cardiac silhouette with pulmonary vascular congestion. Extrapleural density at the lower lateral RIGHT chest likely related to recent RIGHT rib fractures. No definite acute infiltrate. Electronically Signed   By: Ulyses Southward M.D.   On: 01/13/2016 11:48       Subjective: Feeling better. Shortness of breath improving  Discharge Exam: Vitals:   02/06/16 2100 02/07/16 0620  BP: (!) 152/58 (!) 145/66  Pulse: 84 71   Resp: 18 16  Temp: 98.6 F (37 C) 98.9 F (37.2 C)   Vitals:   02/06/16 2100 02/07/16 0353 02/07/16 0620 02/07/16 0726  BP: (!) 152/58  (!) 145/66   Pulse: 84  71   Resp: 18  16   Temp: 98.6 F (37 C)  98.9 F (37.2 C)   TempSrc: Oral  Oral   SpO2: 93%  92% 97%  Weight:  74.6 kg (164 lb 8 oz)    Height:        General: Pt is alert, awake, not in acute distress Cardiovascular: RRR, S1/S2 +, no rubs, no gallops Respiratory: CTA bilaterally, no wheezing, no rhonchi Abdominal: Soft, NT, ND, bowel sounds + Extremities: no edema, no cyanosis    The results of significant diagnostics from this hospitalization (including imaging, microbiology, ancillary and laboratory) are listed below for reference.     Microbiology: No results found for this or any previous visit (from the past 240 hour(s)).   Labs: BNP (last 3 results)  Recent Labs  01/28/16 1208 02/02/16 1036 02/05/16 0923  BNP 680.2* 706.9* 1,173.0*   Basic Metabolic Panel:  Recent Labs Lab 02/02/16 1036 02/05/16 0921 02/06/16 0641 02/07/16 0629  NA 142 139 139 137  K 4.2 3.4* 3.5 3.0*  CL 109 103 99* 97*  CO2 23 26 29 30   GLUCOSE 124* 138* 127* 138*  BUN 22 29* 30* 32*  CREATININE 1.40* 1.33* 1.38* 1.43*  CALCIUM 7.8* 8.9 8.9 8.3*   Liver Function Tests: No results for input(s): AST, ALT, ALKPHOS, BILITOT, PROT, ALBUMIN in the last 168 hours. No results for input(s): LIPASE, AMYLASE in the last 168 hours. No results for input(s): AMMONIA in the last 168 hours. CBC:  Recent Labs Lab 02/02/16 1036 02/05/16 0921  WBC 6.6 6.9  HGB 9.4* 10.3*  HCT 29.4* 31.9*  MCV 85.0 85.5  PLT 181 207   Cardiac Enzymes:  Recent Labs Lab 02/05/16 0921  TROPONINI 0.04*   BNP: Invalid input(s): POCBNP CBG: No results for input(s): GLUCAP in the last 168 hours. D-Dimer No results for input(s): DDIMER in the last 72 hours. Hgb A1c No results for input(s): HGBA1C in the last 72 hours. Lipid Profile No  results for input(s): CHOL, HDL, LDLCALC, TRIG, CHOLHDL, LDLDIRECT in the last 72 hours. Thyroid function studies No results for input(s): TSH, T4TOTAL, T3FREE, THYROIDAB in the last 72 hours.  Invalid input(s): FREET3 Anemia work up No results for input(s): VITAMINB12, FOLATE, FERRITIN, TIBC, IRON, RETICCTPCT in the last 72 hours. Urinalysis    Component Value Date/Time   COLORURINE YELLOW 12/03/2015 1010   APPEARANCEUR CLEAR 12/03/2015 1010   LABSPEC 1.025  12/03/2015 1010   PHURINE 6.0 12/03/2015 1010   GLUCOSEU NEGATIVE 12/03/2015 1010   HGBUR NEGATIVE 12/03/2015 1010   BILIRUBINUR NEGATIVE 12/03/2015 1010   KETONESUR NEGATIVE 12/03/2015 1010   PROTEINUR NEGATIVE 12/03/2015 1010   UROBILINOGEN 0.2 04/13/2014 1200   NITRITE NEGATIVE 12/03/2015 1010   LEUKOCYTESUR NEGATIVE 12/03/2015 1010   Sepsis Labs Invalid input(s): PROCALCITONIN,  WBC,  LACTICIDVEN Microbiology No results found for this or any previous visit (from the past 240 hour(s)).   Time coordinating discharge: Over 30 minutes  SIGNED:   Erick Blinks, MD  [facility] 02/07/2016, 12:24 PM Pager   If 7PM-7AM, please contact night-coverage [email or web address] Password [email or web address]

## 2016-02-07 NOTE — Progress Notes (Signed)
Pt discharged home with IV removed and site intact. Patient sent home with belongings and prescriptions. Faxed Home Health orders over to [facility] for [facility] RN care per patient request.

## 2016-02-09 ENCOUNTER — Other Ambulatory Visit: Payer: Self-pay | Admitting: *Deleted

## 2016-02-09 DIAGNOSIS — I5033 Acute on chronic diastolic (congestive) heart failure: Secondary | ICD-10-CM | POA: Diagnosis not present

## 2016-02-09 DIAGNOSIS — N644 Mastodynia: Secondary | ICD-10-CM

## 2016-02-09 DIAGNOSIS — G309 Alzheimer's disease, unspecified: Secondary | ICD-10-CM | POA: Diagnosis not present

## 2016-02-09 DIAGNOSIS — N183 Chronic kidney disease, stage 3 (moderate): Secondary | ICD-10-CM | POA: Diagnosis not present

## 2016-02-09 DIAGNOSIS — J441 Chronic obstructive pulmonary disease with (acute) exacerbation: Secondary | ICD-10-CM | POA: Diagnosis not present

## 2016-02-09 DIAGNOSIS — I2510 Atherosclerotic heart disease of native coronary artery without angina pectoris: Secondary | ICD-10-CM | POA: Diagnosis not present

## 2016-02-09 DIAGNOSIS — I130 Hypertensive heart and chronic kidney disease with heart failure and stage 1 through stage 4 chronic kidney disease, or unspecified chronic kidney disease: Secondary | ICD-10-CM | POA: Diagnosis not present

## 2016-02-09 NOTE — Progress Notes (Unsigned)
Us/

## 2016-02-11 DIAGNOSIS — Z9981 Dependence on supplemental oxygen: Secondary | ICD-10-CM | POA: Diagnosis not present

## 2016-02-11 DIAGNOSIS — J9610 Chronic respiratory failure, unspecified whether with hypoxia or hypercapnia: Secondary | ICD-10-CM | POA: Diagnosis not present

## 2016-02-11 DIAGNOSIS — I130 Hypertensive heart and chronic kidney disease with heart failure and stage 1 through stage 4 chronic kidney disease, or unspecified chronic kidney disease: Secondary | ICD-10-CM | POA: Diagnosis not present

## 2016-02-11 DIAGNOSIS — G309 Alzheimer's disease, unspecified: Secondary | ICD-10-CM | POA: Diagnosis not present

## 2016-02-11 DIAGNOSIS — J441 Chronic obstructive pulmonary disease with (acute) exacerbation: Secondary | ICD-10-CM | POA: Diagnosis not present

## 2016-02-11 DIAGNOSIS — I5033 Acute on chronic diastolic (congestive) heart failure: Secondary | ICD-10-CM | POA: Diagnosis not present

## 2016-02-11 DIAGNOSIS — I2510 Atherosclerotic heart disease of native coronary artery without angina pectoris: Secondary | ICD-10-CM | POA: Diagnosis not present

## 2016-02-11 DIAGNOSIS — N183 Chronic kidney disease, stage 3 (moderate): Secondary | ICD-10-CM | POA: Diagnosis not present

## 2016-02-11 DIAGNOSIS — F0280 Dementia in other diseases classified elsewhere without behavioral disturbance: Secondary | ICD-10-CM | POA: Diagnosis not present

## 2016-02-11 DIAGNOSIS — S2241XD Multiple fractures of ribs, right side, subsequent encounter for fracture with routine healing: Secondary | ICD-10-CM | POA: Diagnosis not present

## 2016-02-11 DIAGNOSIS — R7303 Prediabetes: Secondary | ICD-10-CM | POA: Diagnosis not present

## 2016-02-16 ENCOUNTER — Ambulatory Visit (HOSPITAL_COMMUNITY)
Admission: RE | Admit: 2016-02-16 | Discharge: 2016-02-16 | Disposition: A | Discharge status: Home / Self Care | Payer: Medicare Other | Source: Ambulatory Visit | Attending: Family Medicine | Admitting: Family Medicine

## 2016-02-16 ENCOUNTER — Encounter (HOSPITAL_COMMUNITY): Payer: Medicare Other

## 2016-02-16 DIAGNOSIS — I130 Hypertensive heart and chronic kidney disease with heart failure and stage 1 through stage 4 chronic kidney disease, or unspecified chronic kidney disease: Secondary | ICD-10-CM | POA: Diagnosis not present

## 2016-02-16 DIAGNOSIS — N183 Chronic kidney disease, stage 3 (moderate): Secondary | ICD-10-CM | POA: Diagnosis not present

## 2016-02-16 DIAGNOSIS — N644 Mastodynia: Secondary | ICD-10-CM | POA: Diagnosis not present

## 2016-02-16 DIAGNOSIS — G309 Alzheimer's disease, unspecified: Secondary | ICD-10-CM | POA: Diagnosis not present

## 2016-02-16 DIAGNOSIS — J441 Chronic obstructive pulmonary disease with (acute) exacerbation: Secondary | ICD-10-CM | POA: Diagnosis not present

## 2016-02-16 DIAGNOSIS — I2510 Atherosclerotic heart disease of native coronary artery without angina pectoris: Secondary | ICD-10-CM | POA: Diagnosis not present

## 2016-02-16 DIAGNOSIS — I5033 Acute on chronic diastolic (congestive) heart failure: Secondary | ICD-10-CM | POA: Diagnosis not present

## 2016-02-19 ENCOUNTER — Other Ambulatory Visit: Payer: Self-pay | Admitting: Family Medicine

## 2016-02-19 ENCOUNTER — Telehealth: Payer: Self-pay | Admitting: *Deleted

## 2016-02-19 ENCOUNTER — Encounter: Payer: Self-pay | Admitting: Nurse Practitioner

## 2016-02-19 ENCOUNTER — Ambulatory Visit (INDEPENDENT_AMBULATORY_CARE_PROVIDER_SITE_OTHER): Payer: Medicare Other | Admitting: Nurse Practitioner

## 2016-02-19 VITALS — BP 150/80 | HR 60 | Ht 63.0 in | Wt 172.4 lb

## 2016-02-19 DIAGNOSIS — R0602 Shortness of breath: Secondary | ICD-10-CM

## 2016-02-19 DIAGNOSIS — I5033 Acute on chronic diastolic (congestive) heart failure: Secondary | ICD-10-CM | POA: Diagnosis not present

## 2016-02-19 DIAGNOSIS — J441 Chronic obstructive pulmonary disease with (acute) exacerbation: Secondary | ICD-10-CM | POA: Diagnosis not present

## 2016-02-19 DIAGNOSIS — I5032 Chronic diastolic (congestive) heart failure: Secondary | ICD-10-CM

## 2016-02-19 DIAGNOSIS — I2510 Atherosclerotic heart disease of native coronary artery without angina pectoris: Secondary | ICD-10-CM | POA: Diagnosis not present

## 2016-02-19 DIAGNOSIS — I130 Hypertensive heart and chronic kidney disease with heart failure and stage 1 through stage 4 chronic kidney disease, or unspecified chronic kidney disease: Secondary | ICD-10-CM | POA: Diagnosis not present

## 2016-02-19 DIAGNOSIS — N186 End stage renal disease: Secondary | ICD-10-CM | POA: Diagnosis not present

## 2016-02-19 DIAGNOSIS — N183 Chronic kidney disease, stage 3 (moderate): Secondary | ICD-10-CM | POA: Diagnosis not present

## 2016-02-19 DIAGNOSIS — G309 Alzheimer's disease, unspecified: Secondary | ICD-10-CM | POA: Diagnosis not present

## 2016-02-19 LAB — BASIC METABOLIC PANEL
BUN/Creatinine Ratio: 13 (ref 12–28)
BUN: 17 mg/dL (ref 8–27)
CO2: 29 mmol/L (ref 18–29)
Calcium: 8.2 mg/dL — ABNORMAL LOW (ref 8.7–10.3)
Chloride: 98 mmol/L (ref 96–106)
Creatinine, Ser: 1.34 mg/dL — ABNORMAL HIGH (ref 0.57–1.00)
GFR calc Af Amer: 44 mL/min/{1.73_m2} — ABNORMAL LOW (ref >59)
GFR calc non Af Amer: 38 mL/min/{1.73_m2} — ABNORMAL LOW (ref >59)
Glucose: 157 mg/dL — ABNORMAL HIGH (ref 65–99)
Potassium: 3.4 mmol/L — ABNORMAL LOW (ref 3.5–5.2)
Sodium: 143 mmol/L (ref 134–144)

## 2016-02-19 LAB — CBC
Hematocrit: 31.2 % — ABNORMAL LOW (ref 34.0–46.6)
Hemoglobin: 10.4 g/dL — ABNORMAL LOW (ref 11.1–15.9)
MCH: 27.1 pg (ref 26.6–33.0)
MCHC: 33.3 g/dL (ref 31.5–35.7)
MCV: 81 fL (ref 79–97)
Platelets: 165 10*3/uL (ref 150–379)
RBC: 3.84 x10E6/uL (ref 3.77–5.28)
RDW: 14.8 % (ref 12.3–15.4)
WBC: 5.7 10*3/uL (ref 3.4–10.8)

## 2016-02-19 LAB — PRO B NATRIURETIC PEPTIDE: NT-Pro BNP: 7074 pg/mL — ABNORMAL HIGH (ref 0–738)

## 2016-02-19 MED ORDER — POTASSIUM CHLORIDE ER 10 MEQ PO TBCR: 10.0000 meq | EXTENDED_RELEASE_TABLET | Freq: Two times a day (BID) | ORAL | 3 refills | Status: DC

## 2016-02-19 NOTE — Patient Instructions (Addendum)
We will be checking the following labs today - STAT Pro BNP, CBC, BMET   Medication Instructions:    Continue with your current medicines. BUT  I am adding back potassium 10 meq to take twice a day    Testing/Procedures To Be Arranged:  N/A  Follow-Up:   Referral placed for palliative care    Other Special Instructions:   N/A    If you need a refill on your cardiac medications before your next appointment, please call your pharmacy.   Call the [facility] office at [phone] if you have any questions, problems or concerns.

## 2016-02-19 NOTE — Progress Notes (Signed)
CARDIOLOGY OFFICE NOTE  Date:  02/19/2016    [name] Date of Birth: 1935/08/30 Medical Record #[id number]  PCP:  Lilyan Punt, MD  Cardiologist:  Kirt Boys    Chief Complaint  Patient presents with  . Congestive Heart Failure    Post hospital visit - seen for Dr. Excell Seltzer    History of Present Illness: [name] is a 81 y.o. female who presents today for a follow up/post hospital visit. Seen for Dr. Excell Seltzer.   She has CAD and diastolic heart failure. She has coronary artery disease status post PCI of the LAD in 2011. She underwent cardiac catheterization in 2014 demonstrating patency of her stent site in the LAD. There was no other evidence of obstructive disease noted. Ventriculography was suggestive of apical hypertrophy. A cardiac MRI suggests a variant of hypertrophic cardiomyopathy as well. The patient has COPD and is now O2 dependent.  Her other issues include dementia, HTN, COPD, HLD and diastolic HF. She has been noted to be quite sedentary and has trouble adhering to salt restriction.   I last saw her back in June of 2017. Her symptoms were getting harder to discern and it was getting harder to take care of her.  Chronically short of breath. Non compliant with salt restriction.   Admitted back in November with worsening SOB. Had had the flu. Treated for diastolic HF and COPD exacerbation. Noted to have had NSVT. No intervention recommended by cardiology. Troponin mildly elevated - attributed to demand ischemia. Metoprolol was started. Her dose of ARB was cut back.   I then saw her back after that admission - very short of breath. Did not really seem any better. Looking more end stage to me. Was on less diuretics and off her steroids and seemed to be failing in general. Ended up getting readmitted for IV lasix. Was able to be weaned down to 3 liters. Not compliant with salt restriction. I had already discussed probable Hospice referral with her  granddaughter Kennyth Arnold that works here in [place].   Comes back today. Here with her daughter today. Remains short of breath. Weight already going back up. Last potassium 3.0. No recent labs. On 3 liters of oxygen. She says she is doing better than she was at her last visit here. Does not wish to go back to the hospital. Family frustrated and having a hard time caring for her - especially when oxygen levels drop and Daaiyah does not think appropriately. She does not wish to restrict her liquids. Using less salt but not going to stop. Sometimes takes her oxygen off and family sees her "struggling". Not on any potassium now - not clear why. No real swelling. Does get chest tightness with minimal activities as well as continued DOE.   Past Medical History:  Diagnosis Date  . Acute on chronic diastolic heart failure (HCC) 04/11/2014  . Asthma   . Bradycardia   . CAD (coronary artery disease) 08/2009   s/p PCI of the LAD  . Carotid artery stenosis   . Chronic headache   . COPD (chronic obstructive pulmonary disease) (HCC)   . Dementia    patient denies this  . Hyperlipidemia   . Hypertension   . Impaired fasting glucose   . Myocardial infarction    Non-st segment elevated  . On home O2   . Osteoporosis 2009  . Pneumonia   . Seasonal allergies   . Ulnar neuropathy     Past Surgical  History:  Procedure Laterality Date  . BLADDER SURGERY  1991  . CARDIAC CATHETERIZATION  11/2010   negative  . CATARACT EXTRACTION  Nov 2016  . LEFT HEART CATHETERIZATION WITH CORONARY ANGIOGRAM N/A 11/05/2012   Procedure: LEFT HEART CATHETERIZATION WITH CORONARY ANGIOGRAM;  Surgeon: Micheline Chapman, MD;  [facility];  Service: Cardiovascular;  Laterality: N/A;  . PARTIAL HYSTERECTOMY  1984  . Radial artery catheter     Bladder     Medications: Current Outpatient Prescriptions  Medication Sig Dispense Refill  . albuterol (PROVENTIL HFA;VENTOLIN HFA) 108 (90 Base) MCG/ACT inhaler Inhale 2 puffs into  the lungs 3 (three) times daily. 1 Inhaler 3  . albuterol (PROVENTIL) (2.5 MG/3ML) 0.083% nebulizer solution Take 3 mLs (2.5 mg total) by nebulization every 4 (four) hours as needed for wheezing or shortness of breath. 75 mL 2  . alendronate (FOSAMAX) 70 MG tablet TAKE 1 TABLET BY MOUTH 1 TIME WEEKLY AS DIRECTED 12 tablet 0  . amLODipine (NORVASC) 5 MG tablet TAKE 1/2 TABLET BY MOUTH DAILY 45 tablet 5  . Ascorbic Acid (VITAMIN C PO) Take 1 tablet by mouth daily.    Marland Kitchen aspirin EC 81 MG tablet Take 81 mg by mouth daily.    . budesonide-formoterol (SYMBICORT) 160-4.5 MCG/ACT inhaler Inhale 2 puffs into the lungs 2 (two) times daily. 1 Inhaler 12  . busPIRone (BUSPAR) 7.5 MG tablet Take 7.5 mg by mouth 3 (three) times daily.    . Calcium Carbonate (CALCIUM 600 PO) Take 1 tablet by mouth daily.     . cetirizine (ZYRTEC) 10 MG tablet Take 10 mg by mouth daily.    . Cholecalciferol (VITAMIN D3) 1000 UNITS CAPS Take 1 capsule by mouth daily.    Marland Kitchen donepezil (ARICEPT) 10 MG tablet TAKE 1 TABLET BY MOUTH EVERY NIGHT AT BEDTIME 90 tablet 0  . furosemide (LASIX) 80 MG tablet Take 1 tablet (80 mg total) by mouth daily. 30 tablet 0  . ipratropium (ATROVENT) 0.02 % nebulizer solution Take 2.5 mLs (0.5 mg total) by nebulization every 6 (six) hours as needed for wheezing or shortness of breath. 75 mL 12  . isosorbide mononitrate (IMDUR) 30 MG 24 hr tablet Take 0.5 tablets (15 mg total) by mouth daily. 30 tablet 3  . LORazepam (ATIVAN) 1 MG tablet TAKE 1/2 TABLET TO 1 TABLET BY MOUTH EVERY NIGHT AT BEDTIME AS NEEDED 30 tablet 5  . losartan (COZAAR) 25 MG tablet Take 1 tablet (25 mg total) by mouth daily. 30 tablet 2  . metoprolol tartrate (LOPRESSOR) 25 MG tablet Take 1 tablet (25 mg total) by mouth 2 (two) times daily. 60 tablet 2  . nitroGLYCERIN (NITROSTAT) 0.4 MG SL tablet Place 1 tablet (0.4 mg total) under the tongue as needed for chest pain. 25 tablet 0  . omeprazole (PRILOSEC) 20 MG capsule Take 20 mg by  mouth daily.    . ondansetron (ZOFRAN) 8 MG tablet Take 1 tablet (8 mg total) by mouth every 8 (eight) hours as needed for nausea. 20 tablet 6  . polyethylene glycol (MIRALAX / GLYCOLAX) packet Take 17 g by mouth daily as needed for moderate constipation.    . pravastatin (PRAVACHOL) 40 MG tablet TAKE 1 TABLET BY MOUTH EVERY DAY 90 tablet 0  . ranitidine (ZANTAC) 150 MG tablet Take 150 mg by mouth daily as needed for heartburn.    . sertraline (ZOLOFT) 100 MG tablet TAKE 1& 1/2 TABLETS BY MOUTH DAILY 225 tablet 1  .  Simethicone (GAS RELIEF PO) Take 1 tablet by mouth daily as needed (gas relief).    . potassium chloride (K-DUR) 10 MEQ tablet Take 1 tablet (10 mEq total) by mouth 2 (two) times daily. 90 tablet 3   No current facility-administered medications for this visit.     Allergies: Allergies  Allergen Reactions  . Codeine Nausea And Vomiting  . Dilaudid [Hydromorphone Hcl] Other (See Comments)    Extremely sensitive  . Erythromycin Other (See Comments)    Intense stomach pain  . Sulfonamide Derivatives     unknown    Social History: The patient  reports that she quit smoking about 8 years ago. She has a 35.00 pack-year smoking history. She has never used smokeless tobacco. She reports that she does not drink alcohol or use drugs.   Family History: The patient's family history includes Addison's disease in her mother; Alcohol abuse in her father.   Review of Systems: Please see the history of present illness.   Otherwise, the review of systems is positive for none.   All other systems are reviewed and negative.   Physical Exam: VS:  BP (!) 150/80   Pulse 60   Ht 5\' 3"  (1.6 m)   Wt 172 lb 6.4 oz (78.2 kg)   SpO2 91% Comment: 97 at rest  BMI 30.54 kg/m  .  BMI Body mass index is 30.54 kg/m.  Wt Readings from Last 3 Encounters:  02/19/16 172 lb 6.4 oz (78.2 kg)  02/07/16 164 lb 8 oz (74.6 kg)  02/02/16 178 lb 12.8 oz (81.1 kg)    General: Pleasant. She is chronically  ill appearing. She is alert and in no acute distress. Weight is going back up.  HEENT: Normal.  Neck: Supple, no JVD, carotid bruits, or masses noted.  Cardiac: Regular rate and rhythm. No murmurs, rubs, or gallops. No real edema.  Respiratory:  Lungs are clear to auscultation bilaterally with normal work of breathing.  GI: Soft and nontender.  MS: No deformity or atrophy. Gait and ROM intact.  Skin: Warm and dry. Color is normal.  Neuro:  Strength and sensation are intact and no gross focal deficits noted.  Psych: Alert, appropriate and with normal affect.   LABORATORY DATA:  EKG:  EKG is not ordered today.  Lab Results  Component Value Date   WBC 6.9 02/05/2016   HGB 10.3 (L) 02/05/2016   HCT 31.9 (L) 02/05/2016   PLT 207 02/05/2016   GLUCOSE 138 (H) 02/07/2016   CHOL 160 04/21/2015   TRIG 131 04/21/2015   HDL 42 04/21/2015   LDLCALC 92 04/21/2015   ALT 18 01/14/2016   AST 20 01/14/2016   NA 137 02/07/2016   K 3.0 (L) 02/07/2016   CL 97 (L) 02/07/2016   CREATININE 1.43 (H) 02/07/2016   BUN 32 (H) 02/07/2016   CO2 30 02/07/2016   TSH 3.086 12/06/2013   INR 1.11 05/07/2013   HGBA1C 5.8 (H) 04/21/2015    BNP (last 3 results)  Recent Labs  01/28/16 1208 02/02/16 1036 02/05/16 0923  BNP 680.2* 706.9* 1,173.0*    ProBNP (last 3 results) No results for input(s): PROBNP in the last 8760 hours.   Other Studies Reviewed Today:  Echo Study Conclusions from 03/2014  - Left ventricle: The cavity size was normal. Wall thickness was increased in a pattern of mild LVH. Systolic function was vigorous. The estimated ejection fraction was in the range of 65% to 70%. Wall motion was normal; there were  no regional wall motion abnormalities. Features are consistent with a pseudonormal left ventricular filling pattern, with concomitant abnormal relaxation and increased filling pressure (grade 2 diastolic dysfunction). - Aortic valve: Mildly calcified annulus.  Trileaflet; mildly thickened leaflets. There was trivial regurgitation. Valve area (VTI): 1.83 cm^2. Valve area (Vmax): 1.74 cm^2. Valve area (Vmean): 2.02 cm^2. - Left atrium: The atrium was moderately dilated. - Right atrium: The atrium was mildly dilated. - Pericardium, extracardiac: There is a large left pleural effusion. There was no pericardial effusion. - Technically difficult study.   CAROTID DOPPLER IMPRESSION FROM 12/2014: 1. Moderate to large amount of atherosclerotic plaque, left greater than right, resulting in elevated peak systolic velocities within the bilateral internal carotid arteries correlated with the greater than 70% luminal narrowing on the left and within the higher end of the 50-69% luminal narrowing on the right. Further evaluation with CTA could be performed as clinically indicated. 2. Antegrade flow is demonstrated within the bilateral vertebral arteries. 3. Incidental note is made of a cardiac arrhythmia. Further evaluation with ECG monitoring could be performed as clinically indicated.   Electronically Signed  By: Simonne Come M.D.  On: 12/18/2014 17:01  Assessment/Plan:  1. Acute on chronic diastolic HF - will not worry about worsening kidney function at this point. She is back on regular dose of Lasix. Not clear why she is not on potassium - restarting that today at her old dose of BID for now. Stat lab today. Focus now will be comfort care/quality - referral to Palliative care - she is agreeable.   2. Multiple COPD exacerbations -  she has already been made a DNR - she is agreeable to Palliative care.   3.  Prior abnormal eye exam - noted branch retinal vein occlusion of left eye   4 CAD, native vessel, with symptoms of angina: managed conservatively. I suspect this is worsened with intermittent hypoxia.   5. Chronic diastolic heart failure, New York Heart Association Class 4: Shortness of breath is a combination of  lung disease and diastolic heart failure. The patient remains very limited. She is now on home O2 and requiring higher FiO2.  Do not feel there is much we can do and her overall prognosis is poor. She wishes to try and remain out of the hospital but wants to continue with efforts at diuresis.   5. Hypertension with chronic kidney disease stage III: She remains on a combination of amlodipine and losartan. I have left her on her current regimen for now.   6. Prior bradycardia - Her beta blocker had to be stopped in the past due to bradycardia - now back on for NSVT - HR remains acceptable today.  Current medicines are reviewed with the patient today.  The patient does not have concerns regarding medicines other than what has been noted above.  The following changes have been made:  See above.  Labs/ tests ordered today include:    Orders Placed This Encounter  Procedures  . Basic metabolic panel  . CBC  . Pro b natriuretic peptide (BNP)  . Amb Referral to Palliative Care     Disposition:   Palliative care. Will see back as needed.    Patient is agreeable to this plan and will call if any problems develop in the interim.   Signed: Rosalio Macadamia, RN, ANP-C 02/19/2016 11:11 AM  [facility] [address] [place], [place]  [address] Phone: [phone] Fax: [phone]

## 2016-02-19 NOTE — Telephone Encounter (Signed)
Reita Cliche from [facility] @ [phone] called back stat labs.  HGB 10.4, Hemocrit 31.2, glucose 157, Cret 1.34, Potassium 3.4, Calcium 8.2, Pro BNP 7074

## 2016-02-19 NOTE — Telephone Encounter (Signed)
S/w Byrd Hesselbach with palliative care @ [phone] will take care of referral.  [name] granddaughters information per [facility] consult.

## 2016-02-22 ENCOUNTER — Other Ambulatory Visit: Payer: Self-pay | Admitting: *Deleted

## 2016-02-22 DIAGNOSIS — I130 Hypertensive heart and chronic kidney disease with heart failure and stage 1 through stage 4 chronic kidney disease, or unspecified chronic kidney disease: Secondary | ICD-10-CM | POA: Diagnosis not present

## 2016-02-22 DIAGNOSIS — I2510 Atherosclerotic heart disease of native coronary artery without angina pectoris: Secondary | ICD-10-CM | POA: Diagnosis not present

## 2016-02-22 DIAGNOSIS — J441 Chronic obstructive pulmonary disease with (acute) exacerbation: Secondary | ICD-10-CM | POA: Diagnosis not present

## 2016-02-22 DIAGNOSIS — I5033 Acute on chronic diastolic (congestive) heart failure: Secondary | ICD-10-CM | POA: Diagnosis not present

## 2016-02-22 DIAGNOSIS — G309 Alzheimer's disease, unspecified: Secondary | ICD-10-CM | POA: Diagnosis not present

## 2016-02-22 DIAGNOSIS — N183 Chronic kidney disease, stage 3 (moderate): Secondary | ICD-10-CM | POA: Diagnosis not present

## 2016-02-23 MED ORDER — FUROSEMIDE 80 MG PO TABS: 80.0000 mg | ORAL_TABLET | Freq: Two times a day (BID) | ORAL | 9 refills | Status: DC

## 2016-02-26 ENCOUNTER — Emergency Department (HOSPITAL_COMMUNITY): Payer: Medicare Other

## 2016-02-26 ENCOUNTER — Telehealth: Payer: Self-pay | Admitting: *Deleted

## 2016-02-26 ENCOUNTER — Telehealth: Payer: Self-pay | Admitting: Family Medicine

## 2016-02-26 ENCOUNTER — Emergency Department (HOSPITAL_COMMUNITY)
Admission: EM | Admit: 2016-02-26 | Discharge: 2016-02-26 | Disposition: A | Discharge status: Home / Self Care | Payer: Medicare Other | Attending: Emergency Medicine | Admitting: Emergency Medicine

## 2016-02-26 ENCOUNTER — Encounter (HOSPITAL_COMMUNITY): Payer: Self-pay | Admitting: Emergency Medicine

## 2016-02-26 ENCOUNTER — Other Ambulatory Visit: Payer: Self-pay | Admitting: *Deleted

## 2016-02-26 DIAGNOSIS — J449 Chronic obstructive pulmonary disease, unspecified: Secondary | ICD-10-CM | POA: Insufficient documentation

## 2016-02-26 DIAGNOSIS — I5021 Acute systolic (congestive) heart failure: Secondary | ICD-10-CM | POA: Diagnosis not present

## 2016-02-26 DIAGNOSIS — I2510 Atherosclerotic heart disease of native coronary artery without angina pectoris: Secondary | ICD-10-CM | POA: Insufficient documentation

## 2016-02-26 DIAGNOSIS — N183 Chronic kidney disease, stage 3 (moderate): Secondary | ICD-10-CM | POA: Insufficient documentation

## 2016-02-26 DIAGNOSIS — I252 Old myocardial infarction: Secondary | ICD-10-CM | POA: Insufficient documentation

## 2016-02-26 DIAGNOSIS — J45909 Unspecified asthma, uncomplicated: Secondary | ICD-10-CM | POA: Insufficient documentation

## 2016-02-26 DIAGNOSIS — Z79899 Other long term (current) drug therapy: Secondary | ICD-10-CM | POA: Insufficient documentation

## 2016-02-26 DIAGNOSIS — Z87891 Personal history of nicotine dependence: Secondary | ICD-10-CM | POA: Diagnosis not present

## 2016-02-26 DIAGNOSIS — I130 Hypertensive heart and chronic kidney disease with heart failure and stage 1 through stage 4 chronic kidney disease, or unspecified chronic kidney disease: Secondary | ICD-10-CM | POA: Insufficient documentation

## 2016-02-26 DIAGNOSIS — R0602 Shortness of breath: Secondary | ICD-10-CM | POA: Diagnosis not present

## 2016-02-26 DIAGNOSIS — I5020 Unspecified systolic (congestive) heart failure: Secondary | ICD-10-CM | POA: Diagnosis not present

## 2016-02-26 DIAGNOSIS — I110 Hypertensive heart disease with heart failure: Secondary | ICD-10-CM | POA: Diagnosis not present

## 2016-02-26 LAB — CBC WITH DIFFERENTIAL/PLATELET
BASOS ABS: 0 10*3/uL (ref 0.0–0.1)
Basophils Relative: 0 %
EOS PCT: 3 %
Eosinophils Absolute: 0.2 10*3/uL (ref 0.0–0.7)
HCT: 32.5 % — ABNORMAL LOW (ref 36.0–46.0)
HEMOGLOBIN: 10.5 g/dL — AB (ref 12.0–15.0)
LYMPHS ABS: 0.5 10*3/uL — AB (ref 0.7–4.0)
LYMPHS PCT: 7 %
MCH: 26.9 pg (ref 26.0–34.0)
MCHC: 32.3 g/dL (ref 30.0–36.0)
MCV: 83.1 fL (ref 78.0–100.0)
Monocytes Absolute: 0.6 10*3/uL (ref 0.1–1.0)
Monocytes Relative: 8 %
NEUTROS PCT: 82 %
Neutro Abs: 5.5 10*3/uL (ref 1.7–7.7)
PLATELETS: 173 10*3/uL (ref 150–400)
RBC: 3.91 MIL/uL (ref 3.87–5.11)
RDW: 14.9 % (ref 11.5–15.5)
WBC: 6.9 10*3/uL (ref 4.0–10.5)

## 2016-02-26 LAB — COMPREHENSIVE METABOLIC PANEL
ALBUMIN: 4.3 g/dL (ref 3.5–5.0)
ALK PHOS: 57 U/L (ref 38–126)
ALT: 18 U/L (ref 14–54)
ANION GAP: 10 (ref 5–15)
AST: 20 U/L (ref 15–41)
BILIRUBIN TOTAL: 0.5 mg/dL (ref 0.3–1.2)
BUN: 21 mg/dL — AB (ref 6–20)
CALCIUM: 8.3 mg/dL — AB (ref 8.9–10.3)
CO2: 32 mmol/L (ref 22–32)
Chloride: 97 mmol/L — ABNORMAL LOW (ref 101–111)
Creatinine, Ser: 1.26 mg/dL — ABNORMAL HIGH (ref 0.44–1.00)
GFR calc Af Amer: 46 mL/min — ABNORMAL LOW (ref >60)
GFR calc non Af Amer: 40 mL/min — ABNORMAL LOW (ref >60)
GLUCOSE: 108 mg/dL — AB (ref 65–99)
POTASSIUM: 3.3 mmol/L — AB (ref 3.5–5.1)
SODIUM: 139 mmol/L (ref 135–145)
Total Protein: 7.7 g/dL (ref 6.5–8.1)

## 2016-02-26 LAB — BRAIN NATRIURETIC PEPTIDE: B Natriuretic Peptide: 976 pg/mL — ABNORMAL HIGH (ref 0.0–100.0)

## 2016-02-26 LAB — TROPONIN I: Troponin I: 0.05 ng/mL (ref <0.03)

## 2016-02-26 MED ORDER — IPRATROPIUM BROMIDE 0.02 % IN SOLN
0.5000 mg | Freq: Once | RESPIRATORY_TRACT | Status: AC
  Administered 2016-02-26: 0.5 mg via RESPIRATORY_TRACT
  Filled 2016-02-26: qty 2.5

## 2016-02-26 MED ORDER — ALBUTEROL SULFATE (2.5 MG/3ML) 0.083% IN NEBU
5.0000 mg | INHALATION_SOLUTION | Freq: Once | RESPIRATORY_TRACT | Status: DC
  Filled 2016-02-26: qty 6

## 2016-02-26 MED ORDER — ALBUTEROL SULFATE (2.5 MG/3ML) 0.083% IN NEBU
5.0000 mg | INHALATION_SOLUTION | Freq: Once | RESPIRATORY_TRACT | Status: AC
  Administered 2016-02-26: 5 mg via RESPIRATORY_TRACT

## 2016-02-26 MED ORDER — FUROSEMIDE 10 MG/ML IJ SOLN
40.0000 mg | Freq: Once | INTRAMUSCULAR | Status: AC
  Administered 2016-02-26: 40 mg via INTRAVENOUS
  Filled 2016-02-26: qty 4

## 2016-02-26 MED ORDER — ALBUTEROL SULFATE HFA 108 (90 BASE) MCG/ACT IN AERS
2.0000 | INHALATION_SPRAY | RESPIRATORY_TRACT | Status: DC | PRN
  Filled 2016-02-26: qty 6.7

## 2016-02-26 NOTE — Telephone Encounter (Signed)
S/w pt's grand daughter and Kathe Becton NP, about pt's care.  Np is at pt's house with palliative care and stated pt sounds terrible needs to go to hospital [name] make the call.  Stated family wants to keep pt at home will call in hospice.  Nada Boozer, NP stated pt needs IV lasix to go to hospital, pt does not want to go.  Stated [name] is not here and wants to stay out of it contact PCP.  Synetta Fail stated will try PCP if not our office has to sign order.  Will send to Elliot Cousin, RMA since I am leaving if order comes through.

## 2016-02-26 NOTE — ED Provider Notes (Signed)
[facility]Y DEPT Provider Note   CSN: [id number] Arrival date & time: 02/26/16  1220     History   Chief Complaint Chief Complaint  Patient presents with  . Shortness of Breath    HPI [name] is a 81 y.o. female.  Patient complains of shortness of breath and wheezing. Patient has a history of congestive heart failure. She has recently increased her Lasix. She is getting hospice involved with her care. And she is a DO NOT RESUSCITATE   The history is provided by the patient.  Shortness of Breath  This is a recurrent problem. The problem occurs continuously.The current episode started yesterday. The problem has not changed since onset.Associated symptoms include wheezing. Pertinent negatives include no fever, no headaches, no ear pain, no cough, no chest pain, no abdominal pain and no rash. It is unknown what precipitated the problem.    Past Medical History:  Diagnosis Date  . Acute on chronic diastolic heart failure (HCC) 04/11/2014  . Asthma   . Bradycardia   . CAD (coronary artery disease) 08/2009   s/p PCI of the LAD  . Carotid artery stenosis   . Chronic headache   . COPD (chronic obstructive pulmonary disease) (HCC)   . Dementia    patient denies this  . Hyperlipidemia   . Hypertension   . Impaired fasting glucose   . Myocardial infarction    Non-st segment elevated  . On home O2   . Osteoporosis 2009  . Pneumonia   . Seasonal allergies   . Ulnar neuropathy     Patient Active Problem List   Diagnosis Date Noted  . CHF (congestive heart failure) (HCC) 02/05/2016  . Acute on chronic diastolic congestive heart failure (HCC) 01/13/2016  . Elevated troponin 01/13/2016  . Multiple fractures of ribs, right side, initial encounter for closed fracture 12/03/2015  . Rib fractures 12/03/2015  . Chronic respiratory failure (HCC) 12/03/2015  . COPD (chronic obstructive pulmonary disease) (HCC) 12/03/2015  . Senile purpura (HCC) 09/07/2015  . Benign  essential tremor 09/07/2015  . Chronic diastolic congestive heart failure (HCC) 04/20/2015  . Renal insufficiency 04/25/2014  . Acute on chronic diastolic heart failure (HCC) 04/11/2014  . Acute on chronic respiratory failure (HCC) 04/11/2014  . CKD (chronic kidney disease) stage 3, GFR 30-59 ml/min 04/11/2014  . Prediabetes 12/03/2013  . Hypertrophic obstructive cardiomyopathy (HCC) 11/09/2013  . Hypoxia 08/28/2013  . Respiratory failure with hypoxia (HCC) 05/07/2013  . Acute diastolic CHF (congestive heart failure) (HCC) 05/07/2013  . COPD exacerbation (HCC) 02/15/2013  . Hyperlipidemia 11/09/2010  . Acute diastolic heart failure (HCC) 10/15/2010  . CAD (coronary artery disease) 09/18/2009  . BRADYCARDIA 08/19/2009  . Dementia 07/19/2007  . Essential hypertension 07/19/2007  . CAROTID ARTERY STENOSIS 07/19/2007  . ASTHMA 07/19/2007  . COPD with asthma (HCC) 07/19/2007  . HEADACHE, CHRONIC, HX OF 07/19/2007    Past Surgical History:  Procedure Laterality Date  . BLADDER SURGERY  1991  . CARDIAC CATHETERIZATION  11/2010   negative  . CATARACT EXTRACTION  Nov 2016  . LEFT HEART CATHETERIZATION WITH CORONARY ANGIOGRAM N/A 11/05/2012   Procedure: LEFT HEART CATHETERIZATION WITH CORONARY ANGIOGRAM;  Surgeon: Micheline Chapman, MD;  Location: [facility] CATH LAB;  Service: Cardiovascular;  Laterality: N/A;  . PARTIAL HYSTERECTOMY  1984  . Radial artery catheter     Bladder    OB History    Gravida Para Term Preterm AB Living  3   SAB TAB Ectopic Multiple Live Births                   Home Medications    Prior to Admission medications   Medication Sig Start Date End Date Taking? Authorizing Provider  albuterol (PROVENTIL HFA;VENTOLIN HFA) 108 (90 Base) MCG/ACT inhaler Inhale 2 puffs into the lungs 3 (three) times daily. 09/07/15  Yes Babs Sciara, MD  albuterol (PROVENTIL) (2.5 MG/3ML) 0.083% nebulizer solution Take 3 mLs (2.5 mg total) by nebulization every 4 (four)  hours as needed for wheezing or shortness of breath. 10/12/15  Yes Babs Sciara, MD  alendronate (FOSAMAX) 70 MG tablet TAKE 1 TABLET BY MOUTH 1 TIME WEEKLY AS DIRECTED 11/06/15  Yes Babs Sciara, MD  amLODipine (NORVASC) 5 MG tablet TAKE 1/2 TABLET BY MOUTH DAILY 07/09/15  Yes Babs Sciara, MD  Ascorbic Acid (VITAMIN C PO) Take 1 tablet by mouth daily.   Yes Historical Provider, MD  aspirin EC 81 MG tablet Take 81 mg by mouth daily.   Yes Historical Provider, MD  budesonide-formoterol (SYMBICORT) 160-4.5 MCG/ACT inhaler Inhale 2 puffs into the lungs 2 (two) times daily. 08/28/13  Yes Babs Sciara, MD  busPIRone (BUSPAR) 7.5 MG tablet Take 7.5 mg by mouth 3 (three) times daily.   Yes Historical Provider, MD  Calcium Carbonate (CALCIUM 600 PO) Take 1 tablet by mouth daily.    Yes Historical Provider, MD  cetirizine (ZYRTEC) 10 MG tablet Take 10 mg by mouth daily as needed for allergies.    Yes Historical Provider, MD  Cholecalciferol (VITAMIN D3) 1000 UNITS CAPS Take 1 capsule by mouth daily.   Yes Historical Provider, MD  donepezil (ARICEPT) 10 MG tablet TAKE 1 TABLET BY MOUTH EVERY NIGHT AT BEDTIME 09/17/15  Yes Babs Sciara, MD  furosemide (LASIX) 80 MG tablet Take 1 tablet (80 mg total) by mouth 2 (two) times daily. (80 mg) in the am (40 mg ) in the pm. 02/23/16  Yes Rosalio Macadamia, NP  ipratropium (ATROVENT) 0.02 % nebulizer solution Take 2.5 mLs (0.5 mg total) by nebulization every 6 (six) hours as needed for wheezing or shortness of breath. 01/15/16  Yes Meredeth Ide, MD  isosorbide mononitrate (IMDUR) 30 MG 24 hr tablet Take 0.5 tablets (15 mg total) by mouth daily. 08/10/15  Yes Rosalio Macadamia, NP  LORazepam (ATIVAN) 1 MG tablet TAKE 1/2 TABLET TO 1 TABLET BY MOUTH EVERY NIGHT AT BEDTIME AS NEEDED 09/30/15  Yes Merlyn Albert, MD  losartan (COZAAR) 25 MG tablet Take 1 tablet (25 mg total) by mouth daily. 01/16/16  Yes Meredeth Ide, MD  metoprolol tartrate (LOPRESSOR) 25 MG tablet Take 1  tablet (25 mg total) by mouth 2 (two) times daily. 01/15/16  Yes Meredeth Ide, MD  nitroGLYCERIN (NITROSTAT) 0.4 MG SL tablet Place 1 tablet (0.4 mg total) under the tongue as needed for chest pain. 12/18/14  Yes Babs Sciara, MD  omeprazole (PRILOSEC) 20 MG capsule Take 20 mg by mouth daily.   Yes Historical Provider, MD  ondansetron (ZOFRAN) 8 MG tablet Take 1 tablet (8 mg total) by mouth every 8 (eight) hours as needed for nausea. 12/08/15  Yes Babs Sciara, MD  polyethylene glycol (MIRALAX / GLYCOLAX) packet Take 17 g by mouth daily as needed for moderate constipation. 04/16/14  Yes Elliot Cousin, MD  potassium chloride (K-DUR) 10 MEQ tablet Take 1 tablet (10 mEq total) by mouth  2 (two) times daily. 02/19/16 05/19/16 Yes Rosalio Macadamia, NP  pravastatin (PRAVACHOL) 40 MG tablet TAKE 1 TABLET BY MOUTH EVERY DAY 02/19/16  Yes Babs Sciara, MD  ranitidine (ZANTAC) 150 MG tablet Take 150 mg by mouth daily as needed for heartburn.   Yes Historical Provider, MD  sertraline (ZOLOFT) 100 MG tablet TAKE 1& 1/2 TABLETS BY MOUTH DAILY 01/19/16  Yes Babs Sciara, MD  Simethicone (GAS RELIEF PO) Take 1 tablet by mouth daily as needed (gas relief).   Yes Historical Provider, MD    Family History Family History  Problem Relation Age of Onset  . Addison's disease Mother   . Alcohol abuse Father     Social History Social History  Substance Use Topics  . Smoking status: Former Smoker    Packs/day: 1.00    Years: 35.00    Quit date: 02/15/2008  . Smokeless tobacco: Never Used  . Alcohol use No     Allergies   Codeine; Dilaudid [hydromorphone hcl]; Erythromycin; and Sulfonamide derivatives   Review of Systems Review of Systems  Constitutional: Negative for appetite change, fatigue and fever.  HENT: Negative for congestion, ear discharge, ear pain and sinus pressure.   Eyes: Negative for discharge.  Respiratory: Positive for shortness of breath and wheezing. Negative for cough.   Cardiovascular:  Negative for chest pain.  Gastrointestinal: Negative for abdominal pain and diarrhea.  Genitourinary: Negative for frequency and hematuria.  Musculoskeletal: Negative for back pain.  Skin: Negative for rash.  Neurological: Negative for seizures and headaches.  Psychiatric/Behavioral: Negative for hallucinations.     Physical Exam Updated Vital Signs BP 124/62 (BP Location: Right Arm)   Pulse 77   Temp 99.9 F (37.7 C) (Oral)   Resp 24   Ht 5\' 3"  (1.6 m)   Wt 172 lb (78 kg)   SpO2 92%   BMI 30.47 kg/m   Physical Exam  Constitutional: She is oriented to person, place, and time. She appears well-developed.  HENT:  Head: Normocephalic.  Eyes: Conjunctivae and EOM are normal. No scleral icterus.  Neck: Neck supple. No thyromegaly present.  Cardiovascular: Normal rate and regular rhythm.  Exam reveals no gallop and no friction rub.   No murmur heard. Pulmonary/Chest: No stridor. She has wheezes. She has no rales. She exhibits no tenderness.  Abdominal: She exhibits no distension. There is no tenderness. There is no rebound.  Musculoskeletal: Normal range of motion. She exhibits no edema.  Lymphadenopathy:    She has no cervical adenopathy.  Neurological: She is oriented to person, place, and time. She exhibits normal muscle tone. Coordination normal.  Skin: No rash noted. No erythema.  Psychiatric: She has a normal mood and affect. Her behavior is normal.     ED Treatments / Results  Labs (all labs ordered are listed, but only abnormal results are displayed) Labs Reviewed  CBC WITH DIFFERENTIAL/PLATELET - Abnormal; Notable for the following:       Result Value   Hemoglobin 10.5 (*)    HCT 32.5 (*)    Lymphs Abs 0.5 (*)    All other components within normal limits  BRAIN NATRIURETIC PEPTIDE - Abnormal; Notable for the following:    B Natriuretic Peptide 976.0 (*)    All other components within normal limits  TROPONIN I - Abnormal; Notable for the following:     Troponin I 0.05 (*)    All other components within normal limits  COMPREHENSIVE METABOLIC PANEL - Abnormal; Notable for the  following:    Potassium 3.3 (*)    Chloride 97 (*)    Glucose, Bld 108 (*)    BUN 21 (*)    Creatinine, Ser 1.26 (*)    Calcium 8.3 (*)    GFR calc non Af Amer 40 (*)    GFR calc Af Amer 46 (*)    All other components within normal limits    EKG  EKG Interpretation None       Radiology Dg Chest 2 View  Result Date: 02/26/2016 CLINICAL DATA:  Shortness of Breath EXAM: CHEST  2 VIEW COMPARISON:  February 05, 2016 FINDINGS: There has been near complete resolution of left pleural effusions since prior study. Small residual pleural effusion remains on the left. There is mild bibasilar atelectatic change. Lungs elsewhere clear. Heart is borderline enlarged with pulmonary vascularity within normal limits. There is atherosclerotic calcification aorta. No adenopathy. There is evidence of old rib trauma on the right, stable. There is anterior wedging of a lower thoracic vertebral body, stable. IMPRESSION: Significant interval resolution of right pleural effusion with small residual right pleural effusion remaining. Mild bibasilar atelectasis. Lungs elsewhere clear. Stable cardiac prominence. There is aortic atherosclerosis. Fairly marked anterior wedging of a lower thoracic vertebral body is stable. Electronically Signed   By: Bretta Bang III M.D.   On: 02/26/2016 14:23    Procedures Procedures (including critical care time)  Medications Ordered in ED Medications  albuterol (PROVENTIL HFA;VENTOLIN HFA) 108 (90 Base) MCG/ACT inhaler 2 puff (not administered)  ipratropium (ATROVENT) nebulizer solution 0.5 mg (0.5 mg Nebulization Given 02/26/16 1418)  furosemide (LASIX) injection 40 mg (40 mg Intravenous Given 02/26/16 1419)  albuterol (PROVENTIL) (2.5 MG/3ML) 0.083% nebulizer solution 5 mg (5 mg Nebulization Given 02/26/16 1419)     Initial Impression / Assessment  and Plan / ED Course  I have reviewed the triage vital signs and the nursing notes.  Pertinent labs & imaging results that were available during my care of the patient were reviewed by me and considered in my medical decision making (see chart for details).  Clinical Course      Patient with COPD and heart failure. Patient improved with neb treatments and Lasix. Patient has mildly elevated troponin which is unchanged from previous troponins. This is most likely related to her heart failure. Patient states she feels much better after the neb treatment and the Lasix. She would prefer to go home. Patient is given an albuterol inhaler and this told to increase her Lasix to 80 mg twice a day if needed. She is just increase the Lasix to 80 in the morning and 40 in the evening. Patient will follow-up with her PCP next week  Final Clinical Impressions(s) / ED Diagnoses   Final diagnoses:  Systolic congestive heart failure, unspecified congestive heart failure chronicity (HCC)    New Prescriptions New Prescriptions   No medications on file     Bethann Berkshire, MD 02/26/16 [phone]

## 2016-02-26 NOTE — Telephone Encounter (Signed)
I am okay with doing this but when I spoke with the palliative care nurse no more than 2 hours ago it seemed like there was major disagreement between daughters family and granddaughter in that they were going to the ER. It is very important that this request for hospice is coming from the patient please clarify secondly I am not sure if hospice will necessarily be able to do an emergent consultation on a Friday evening

## 2016-02-26 NOTE — ED Notes (Signed)
Pt taken to xray 

## 2016-02-26 NOTE — Telephone Encounter (Signed)
I spoke with patient's daughter, [name].  I inquired if the request for hospice was coming directly from patient.  Pam states the request for hospice is coming directly from her Mother.  I advised her of the uncertainty regarding hospice consult this evening.  She voiced understanding and thanks.

## 2016-02-26 NOTE — ED Triage Notes (Signed)
Patient from home. Pt was sent by home palliative care due to fluid overload. Patient is to be a hospice patient but palliative care wants her to be dialyzed before they will come in. Patient is short of breath in triage.Hx of COPD, CHF, and renal failure.

## 2016-02-26 NOTE — ED Notes (Signed)
CRITICAL VALUE ALERT  Critical value received:  Troponin 0.05  Date of notification:  02/26/16  Time of notification:  1404  Critical value read back: YES  Nurse who received alert:  Sharia Reeve, RN  MD notified (1st page):  Dr. Estell Harpin  Time of first page:  [phone]

## 2016-02-26 NOTE — Discharge Instructions (Signed)
Increase your Lasix to 80 mg twice a day if you have any problems with breathing. Follow-up with your doctor as planned next week

## 2016-02-26 NOTE — Telephone Encounter (Signed)
Patient's daughter is requesting order for Hospice.

## 2016-02-26 NOTE — Telephone Encounter (Signed)
Called pts granddaughter, Misty Stanley, to give some verbal orders re: pt and fluid overload, and per Misty Stanley, they have sent pt to the hospital, due to her starting to turn grey and the NP that was there thru palliative care wasn't comfortable in leaving her like that.  I advised Misty Stanley that I would let [name], NP know.  She verbalized appreciation and understanding.

## 2016-02-28 NOTE — Telephone Encounter (Signed)
Patient was seen in the ER treated there and released instructed to follow-up with our office this week when family calls for follow-up office visit please work with him so they can be seen this week

## 2016-02-29 ENCOUNTER — Other Ambulatory Visit: Payer: Self-pay | Admitting: *Deleted

## 2016-02-29 ENCOUNTER — Telehealth: Payer: Self-pay | Admitting: *Deleted

## 2016-02-29 ENCOUNTER — Telehealth: Payer: Self-pay | Admitting: Family Medicine

## 2016-02-29 DIAGNOSIS — J449 Chronic obstructive pulmonary disease, unspecified: Secondary | ICD-10-CM

## 2016-02-29 DIAGNOSIS — R0602 Shortness of breath: Secondary | ICD-10-CM

## 2016-02-29 DIAGNOSIS — I5033 Acute on chronic diastolic (congestive) heart failure: Secondary | ICD-10-CM

## 2016-02-29 DIAGNOSIS — I5032 Chronic diastolic (congestive) heart failure: Secondary | ICD-10-CM

## 2016-02-29 MED ORDER — METOLAZONE 2.5 MG PO TABS: 2.5000 mg | ORAL_TABLET | Freq: Every day | ORAL | 1 refills | Status: DC

## 2016-02-29 MED ORDER — POTASSIUM CHLORIDE ER 10 MEQ PO TBCR: 10.0000 meq | EXTENDED_RELEASE_TABLET | Freq: Two times a day (BID) | ORAL | 3 refills | Status: DC

## 2016-02-29 NOTE — Telephone Encounter (Signed)
Grand daughter per pt's DPR Kennyth Arnold works in the office.  Came to [name], NP whom has been taking care grandmother, [name]'s patient.   Will start pt on zaroxolyn (2.5 mg ) Tuesday and Saturday and on Tuesday and Saturday will take potassium tid with zaroxolyn.  Was sent in to pt's requested pharmacy.  Referral was place in system for hospice.  Medication list updated.

## 2016-02-29 NOTE — Telephone Encounter (Signed)
  This would be fine. Question is the patient going to be care for at home with hospice or it minute into a hospice facility? If they need orders signed I will be happy to do so

## 2016-02-29 NOTE — Telephone Encounter (Signed)
Patient daughter wanting her in the care of hospice. [facility] part of hospice wanting a verbal that it would be ok to send her to hospice. Contact-Betty [phone]

## 2016-02-29 NOTE — Telephone Encounter (Signed)
Notified Kathie Rhodes that Dr. Lorin Picket is fine with this and will be glad to sign any orders. Kathie Rhodes states that patient will remain at her home while under hospice care.

## 2016-03-01 ENCOUNTER — Telehealth: Payer: Self-pay | Admitting: Family Medicine

## 2016-03-01 DIAGNOSIS — I2510 Atherosclerotic heart disease of native coronary artery without angina pectoris: Secondary | ICD-10-CM | POA: Diagnosis not present

## 2016-03-01 DIAGNOSIS — N183 Chronic kidney disease, stage 3 (moderate): Secondary | ICD-10-CM | POA: Diagnosis not present

## 2016-03-01 DIAGNOSIS — G309 Alzheimer's disease, unspecified: Secondary | ICD-10-CM | POA: Diagnosis not present

## 2016-03-01 DIAGNOSIS — I5033 Acute on chronic diastolic (congestive) heart failure: Secondary | ICD-10-CM | POA: Diagnosis not present

## 2016-03-01 DIAGNOSIS — I130 Hypertensive heart and chronic kidney disease with heart failure and stage 1 through stage 4 chronic kidney disease, or unspecified chronic kidney disease: Secondary | ICD-10-CM | POA: Diagnosis not present

## 2016-03-01 DIAGNOSIS — J441 Chronic obstructive pulmonary disease with (acute) exacerbation: Secondary | ICD-10-CM | POA: Diagnosis not present

## 2016-03-01 NOTE — Telephone Encounter (Signed)
Hospice is coming out on Saturday for [place], and Kennyth Arnold is requesting Dr. Lorin Picket to call her when he has a moment.

## 2016-03-02 ENCOUNTER — Ambulatory Visit: Payer: Medicare Other | Admitting: Family Medicine

## 2016-03-04 ENCOUNTER — Ambulatory Visit: Payer: Medicare Other | Admitting: Family Medicine

## 2016-03-04 DIAGNOSIS — H34832 Tributary (branch) retinal vein occlusion, left eye, with macular edema: Secondary | ICD-10-CM | POA: Diagnosis not present

## 2016-03-05 DIAGNOSIS — K219 Gastro-esophageal reflux disease without esophagitis: Secondary | ICD-10-CM | POA: Diagnosis not present

## 2016-03-05 DIAGNOSIS — E785 Hyperlipidemia, unspecified: Secondary | ICD-10-CM | POA: Diagnosis not present

## 2016-03-05 DIAGNOSIS — N189 Chronic kidney disease, unspecified: Secondary | ICD-10-CM | POA: Diagnosis not present

## 2016-03-05 DIAGNOSIS — I2510 Atherosclerotic heart disease of native coronary artery without angina pectoris: Secondary | ICD-10-CM | POA: Diagnosis not present

## 2016-03-05 DIAGNOSIS — I10 Essential (primary) hypertension: Secondary | ICD-10-CM | POA: Diagnosis not present

## 2016-03-05 DIAGNOSIS — G309 Alzheimer's disease, unspecified: Secondary | ICD-10-CM | POA: Diagnosis not present

## 2016-03-05 DIAGNOSIS — J301 Allergic rhinitis due to pollen: Secondary | ICD-10-CM | POA: Diagnosis not present

## 2016-03-05 DIAGNOSIS — I509 Heart failure, unspecified: Secondary | ICD-10-CM | POA: Diagnosis not present

## 2016-03-05 DIAGNOSIS — J449 Chronic obstructive pulmonary disease, unspecified: Secondary | ICD-10-CM | POA: Diagnosis not present

## 2016-03-05 DIAGNOSIS — F339 Major depressive disorder, recurrent, unspecified: Secondary | ICD-10-CM | POA: Diagnosis not present

## 2016-03-07 ENCOUNTER — Ambulatory Visit (INDEPENDENT_AMBULATORY_CARE_PROVIDER_SITE_OTHER): Payer: Medicare Other | Admitting: Family Medicine

## 2016-03-07 ENCOUNTER — Encounter: Payer: Self-pay | Admitting: Family Medicine

## 2016-03-07 VITALS — BP 118/82 | Ht 63.0 in | Wt 170.2 lb

## 2016-03-07 DIAGNOSIS — I5032 Chronic diastolic (congestive) heart failure: Secondary | ICD-10-CM

## 2016-03-07 DIAGNOSIS — J449 Chronic obstructive pulmonary disease, unspecified: Secondary | ICD-10-CM | POA: Diagnosis not present

## 2016-03-07 DIAGNOSIS — I509 Heart failure, unspecified: Secondary | ICD-10-CM | POA: Diagnosis not present

## 2016-03-07 DIAGNOSIS — E785 Hyperlipidemia, unspecified: Secondary | ICD-10-CM | POA: Diagnosis not present

## 2016-03-07 DIAGNOSIS — I2510 Atherosclerotic heart disease of native coronary artery without angina pectoris: Secondary | ICD-10-CM | POA: Diagnosis not present

## 2016-03-07 DIAGNOSIS — I10 Essential (primary) hypertension: Secondary | ICD-10-CM | POA: Diagnosis not present

## 2016-03-07 DIAGNOSIS — N189 Chronic kidney disease, unspecified: Secondary | ICD-10-CM | POA: Diagnosis not present

## 2016-03-07 NOTE — Progress Notes (Signed)
   Subjective:    Patient ID: [name], female    DOB: [birth year], 81 y.o.   MRN: [id number]  HPI Patient  Arrives for a follow up on COPD. Patient has end-stage COPD and in stage diastolic dysfunction CHF. Has repetitive admissions into the hospital.  After long discussion family has decided for palliative care. Palliative care rest recommended hospice. Hospice K months Saturday and inflated her into hospice care.  They're recommending for the patient to do everything possible to be treated at home and not have to be in the hospital. Patient seems for the most part to buy into this philosophy   Patient would also like to discuss sinuses. Patient relates a lot of head congestion dryness in the sinuses and intermittent nasal bleeds. Denies fever chills sweats.   Review of Systems Currently patient denies any chest tightness pressure pain shortness of breath except when she does walk around she does get short of breath she is on oxygen.    Objective:   Physical Exam No nasal bleeds currently Lungs are clear Heart regular Pulse normal BP is good Extremities no edema 25 minutes was spent with the patient. Greater than half the time was spent in discussion and answering questions and counseling regarding the issues that the patient came in for today.  Nonessential medications Fosamax pravastatin was stopped Long discussion held regarding hospice. As best I can tell the family is on board with the philosophy of hospice. It may become a little more difficult if CHF kick send regarding treatment only at home versus having a be in the hospital. Time will tell regarding this.    Assessment & Plan:  Daily weights-important for the patient do the best she can taking her medication watching her diet staying in reasonable comfort  Hospices states that they will see the patient any time with symptomatology to try to minimize having to go to the ER  Nosebleeds Vaseline saline nasal spray  should gradually get better

## 2016-03-09 ENCOUNTER — Other Ambulatory Visit: Payer: Self-pay | Admitting: *Deleted

## 2016-03-09 ENCOUNTER — Telehealth: Payer: Self-pay | Admitting: Family Medicine

## 2016-03-09 DIAGNOSIS — E785 Hyperlipidemia, unspecified: Secondary | ICD-10-CM | POA: Diagnosis not present

## 2016-03-09 DIAGNOSIS — I10 Essential (primary) hypertension: Secondary | ICD-10-CM | POA: Diagnosis not present

## 2016-03-09 DIAGNOSIS — I2510 Atherosclerotic heart disease of native coronary artery without angina pectoris: Secondary | ICD-10-CM | POA: Diagnosis not present

## 2016-03-09 DIAGNOSIS — N189 Chronic kidney disease, unspecified: Secondary | ICD-10-CM | POA: Diagnosis not present

## 2016-03-09 DIAGNOSIS — J449 Chronic obstructive pulmonary disease, unspecified: Secondary | ICD-10-CM | POA: Diagnosis not present

## 2016-03-09 DIAGNOSIS — I509 Heart failure, unspecified: Secondary | ICD-10-CM | POA: Diagnosis not present

## 2016-03-09 MED ORDER — MORPHINE SULFATE (CONCENTRATE) 20 MG/ML PO SOLN: ORAL | 0 refills | Status: DC

## 2016-03-09 NOTE — Telephone Encounter (Signed)
That would be fine. Go ahead with prescription. Let me speak with the nurse.

## 2016-03-09 NOTE — Telephone Encounter (Signed)
Dr Lorin Picket spoke with hospice nurse

## 2016-03-09 NOTE — Telephone Encounter (Signed)
Prescription faxed

## 2016-03-09 NOTE — Telephone Encounter (Signed)
Order from Hospice sent back for signature (in red folder at nurse's station on Dr. Roby Lofts side)

## 2016-03-09 NOTE — Telephone Encounter (Signed)
[name] with Hospice called and is requesting rx for Liquid Morphine 20mg  per 9ml, start her at 2.5 mg up to 5mg  every 4 hrs as needed. 30cc bottle. Sent to [facility] in [place]. She can be reached at [phone].

## 2016-03-10 NOTE — Telephone Encounter (Signed)
done

## 2016-03-11 ENCOUNTER — Telehealth: Payer: Self-pay | Admitting: Family Medicine

## 2016-03-11 ENCOUNTER — Other Ambulatory Visit: Payer: Self-pay | Admitting: Nurse Practitioner

## 2016-03-11 ENCOUNTER — Other Ambulatory Visit: Payer: Self-pay | Admitting: *Deleted

## 2016-03-11 DIAGNOSIS — I2510 Atherosclerotic heart disease of native coronary artery without angina pectoris: Secondary | ICD-10-CM | POA: Diagnosis not present

## 2016-03-11 DIAGNOSIS — I509 Heart failure, unspecified: Secondary | ICD-10-CM | POA: Diagnosis not present

## 2016-03-11 DIAGNOSIS — J449 Chronic obstructive pulmonary disease, unspecified: Secondary | ICD-10-CM | POA: Diagnosis not present

## 2016-03-11 DIAGNOSIS — N189 Chronic kidney disease, unspecified: Secondary | ICD-10-CM | POA: Diagnosis not present

## 2016-03-11 DIAGNOSIS — I10 Essential (primary) hypertension: Secondary | ICD-10-CM | POA: Diagnosis not present

## 2016-03-11 DIAGNOSIS — E785 Hyperlipidemia, unspecified: Secondary | ICD-10-CM | POA: Diagnosis not present

## 2016-03-11 MED ORDER — FUROSEMIDE 40 MG PO TABS: ORAL_TABLET | ORAL | 0 refills | Status: DC

## 2016-03-11 NOTE — Telephone Encounter (Signed)
Pt is needing a new prescription for her lasix sent over.    [name]

## 2016-03-11 NOTE — Telephone Encounter (Signed)
Needs to be 80 mg twice a day.

## 2016-03-11 NOTE — Telephone Encounter (Signed)
Pt wants 40mg  2 bid instead of 80mg  one bid because it keeps getting changed back and forth between 40mg  and 80mg . Ok per carolyn. Med sent to pharm. Pt notified.

## 2016-03-14 ENCOUNTER — Telehealth: Payer: Self-pay | Admitting: *Deleted

## 2016-03-14 DIAGNOSIS — I10 Essential (primary) hypertension: Secondary | ICD-10-CM | POA: Diagnosis not present

## 2016-03-14 DIAGNOSIS — E785 Hyperlipidemia, unspecified: Secondary | ICD-10-CM | POA: Diagnosis not present

## 2016-03-14 DIAGNOSIS — J449 Chronic obstructive pulmonary disease, unspecified: Secondary | ICD-10-CM | POA: Diagnosis not present

## 2016-03-14 DIAGNOSIS — I2510 Atherosclerotic heart disease of native coronary artery without angina pectoris: Secondary | ICD-10-CM | POA: Diagnosis not present

## 2016-03-14 DIAGNOSIS — N189 Chronic kidney disease, unspecified: Secondary | ICD-10-CM | POA: Diagnosis not present

## 2016-03-14 DIAGNOSIS — I509 Heart failure, unspecified: Secondary | ICD-10-CM | POA: Diagnosis not present

## 2016-03-14 NOTE — Telephone Encounter (Signed)
Found script up front and faxed to [facility]

## 2016-03-14 NOTE — Telephone Encounter (Signed)
PT had Liquid Morphine called into [facility] last week, but they never got the Rx. Home Health called to ask if RX can be sent to [facility] instead.

## 2016-03-14 NOTE — Telephone Encounter (Signed)
S/w with pt's granddaughter per DPR wanted to clarify pt's lasix. Confirmed with [name], NP, Lasix ( 80 mg )  Bid.

## 2016-03-14 NOTE — Telephone Encounter (Signed)
I had left a message. I've also spoken with the daughter when the patient came in and spoke with hospice. We will do the best we can to try to keep patient comfortable and at home. Yet to be seen how well this works out.

## 2016-03-17 DIAGNOSIS — F339 Major depressive disorder, recurrent, unspecified: Secondary | ICD-10-CM | POA: Diagnosis not present

## 2016-03-17 DIAGNOSIS — K219 Gastro-esophageal reflux disease without esophagitis: Secondary | ICD-10-CM | POA: Diagnosis not present

## 2016-03-17 DIAGNOSIS — J301 Allergic rhinitis due to pollen: Secondary | ICD-10-CM | POA: Diagnosis not present

## 2016-03-17 DIAGNOSIS — I2510 Atherosclerotic heart disease of native coronary artery without angina pectoris: Secondary | ICD-10-CM | POA: Diagnosis not present

## 2016-03-17 DIAGNOSIS — N189 Chronic kidney disease, unspecified: Secondary | ICD-10-CM | POA: Diagnosis not present

## 2016-03-17 DIAGNOSIS — G309 Alzheimer's disease, unspecified: Secondary | ICD-10-CM | POA: Diagnosis not present

## 2016-03-17 DIAGNOSIS — J449 Chronic obstructive pulmonary disease, unspecified: Secondary | ICD-10-CM | POA: Diagnosis not present

## 2016-03-17 DIAGNOSIS — E785 Hyperlipidemia, unspecified: Secondary | ICD-10-CM | POA: Diagnosis not present

## 2016-03-17 DIAGNOSIS — I10 Essential (primary) hypertension: Secondary | ICD-10-CM | POA: Diagnosis not present

## 2016-03-17 DIAGNOSIS — I509 Heart failure, unspecified: Secondary | ICD-10-CM | POA: Diagnosis not present

## 2016-03-21 DIAGNOSIS — I10 Essential (primary) hypertension: Secondary | ICD-10-CM | POA: Diagnosis not present

## 2016-03-21 DIAGNOSIS — E785 Hyperlipidemia, unspecified: Secondary | ICD-10-CM | POA: Diagnosis not present

## 2016-03-21 DIAGNOSIS — I509 Heart failure, unspecified: Secondary | ICD-10-CM | POA: Diagnosis not present

## 2016-03-21 DIAGNOSIS — I2510 Atherosclerotic heart disease of native coronary artery without angina pectoris: Secondary | ICD-10-CM | POA: Diagnosis not present

## 2016-03-21 DIAGNOSIS — N189 Chronic kidney disease, unspecified: Secondary | ICD-10-CM | POA: Diagnosis not present

## 2016-03-21 DIAGNOSIS — J449 Chronic obstructive pulmonary disease, unspecified: Secondary | ICD-10-CM | POA: Diagnosis not present

## 2016-03-23 DIAGNOSIS — I2510 Atherosclerotic heart disease of native coronary artery without angina pectoris: Secondary | ICD-10-CM | POA: Diagnosis not present

## 2016-03-23 DIAGNOSIS — J449 Chronic obstructive pulmonary disease, unspecified: Secondary | ICD-10-CM | POA: Diagnosis not present

## 2016-03-23 DIAGNOSIS — E785 Hyperlipidemia, unspecified: Secondary | ICD-10-CM | POA: Diagnosis not present

## 2016-03-23 DIAGNOSIS — I509 Heart failure, unspecified: Secondary | ICD-10-CM | POA: Diagnosis not present

## 2016-03-23 DIAGNOSIS — I10 Essential (primary) hypertension: Secondary | ICD-10-CM | POA: Diagnosis not present

## 2016-03-23 DIAGNOSIS — N189 Chronic kidney disease, unspecified: Secondary | ICD-10-CM | POA: Diagnosis not present

## 2016-03-24 DIAGNOSIS — I2510 Atherosclerotic heart disease of native coronary artery without angina pectoris: Secondary | ICD-10-CM | POA: Diagnosis not present

## 2016-03-24 DIAGNOSIS — I509 Heart failure, unspecified: Secondary | ICD-10-CM | POA: Diagnosis not present

## 2016-03-24 DIAGNOSIS — N189 Chronic kidney disease, unspecified: Secondary | ICD-10-CM | POA: Diagnosis not present

## 2016-03-24 DIAGNOSIS — I10 Essential (primary) hypertension: Secondary | ICD-10-CM | POA: Diagnosis not present

## 2016-03-24 DIAGNOSIS — E785 Hyperlipidemia, unspecified: Secondary | ICD-10-CM | POA: Diagnosis not present

## 2016-03-24 DIAGNOSIS — J449 Chronic obstructive pulmonary disease, unspecified: Secondary | ICD-10-CM | POA: Diagnosis not present

## 2016-03-28 DIAGNOSIS — J449 Chronic obstructive pulmonary disease, unspecified: Secondary | ICD-10-CM | POA: Diagnosis not present

## 2016-03-28 DIAGNOSIS — N189 Chronic kidney disease, unspecified: Secondary | ICD-10-CM | POA: Diagnosis not present

## 2016-03-28 DIAGNOSIS — I10 Essential (primary) hypertension: Secondary | ICD-10-CM | POA: Diagnosis not present

## 2016-03-28 DIAGNOSIS — I509 Heart failure, unspecified: Secondary | ICD-10-CM | POA: Diagnosis not present

## 2016-03-28 DIAGNOSIS — E785 Hyperlipidemia, unspecified: Secondary | ICD-10-CM | POA: Diagnosis not present

## 2016-03-28 DIAGNOSIS — I2510 Atherosclerotic heart disease of native coronary artery without angina pectoris: Secondary | ICD-10-CM | POA: Diagnosis not present

## 2016-03-31 DIAGNOSIS — N189 Chronic kidney disease, unspecified: Secondary | ICD-10-CM | POA: Diagnosis not present

## 2016-03-31 DIAGNOSIS — J449 Chronic obstructive pulmonary disease, unspecified: Secondary | ICD-10-CM | POA: Diagnosis not present

## 2016-03-31 DIAGNOSIS — E785 Hyperlipidemia, unspecified: Secondary | ICD-10-CM | POA: Diagnosis not present

## 2016-03-31 DIAGNOSIS — I10 Essential (primary) hypertension: Secondary | ICD-10-CM | POA: Diagnosis not present

## 2016-03-31 DIAGNOSIS — I509 Heart failure, unspecified: Secondary | ICD-10-CM | POA: Diagnosis not present

## 2016-03-31 DIAGNOSIS — I2510 Atherosclerotic heart disease of native coronary artery without angina pectoris: Secondary | ICD-10-CM | POA: Diagnosis not present

## 2016-04-04 DIAGNOSIS — I10 Essential (primary) hypertension: Secondary | ICD-10-CM | POA: Diagnosis not present

## 2016-04-04 DIAGNOSIS — N189 Chronic kidney disease, unspecified: Secondary | ICD-10-CM | POA: Diagnosis not present

## 2016-04-04 DIAGNOSIS — I2510 Atherosclerotic heart disease of native coronary artery without angina pectoris: Secondary | ICD-10-CM | POA: Diagnosis not present

## 2016-04-04 DIAGNOSIS — J449 Chronic obstructive pulmonary disease, unspecified: Secondary | ICD-10-CM | POA: Diagnosis not present

## 2016-04-04 DIAGNOSIS — E785 Hyperlipidemia, unspecified: Secondary | ICD-10-CM | POA: Diagnosis not present

## 2016-04-04 DIAGNOSIS — I509 Heart failure, unspecified: Secondary | ICD-10-CM | POA: Diagnosis not present

## 2016-04-08 DIAGNOSIS — H34832 Tributary (branch) retinal vein occlusion, left eye, with macular edema: Secondary | ICD-10-CM | POA: Diagnosis not present

## 2016-04-11 DIAGNOSIS — I509 Heart failure, unspecified: Secondary | ICD-10-CM | POA: Diagnosis not present

## 2016-04-11 DIAGNOSIS — I2510 Atherosclerotic heart disease of native coronary artery without angina pectoris: Secondary | ICD-10-CM | POA: Diagnosis not present

## 2016-04-11 DIAGNOSIS — N189 Chronic kidney disease, unspecified: Secondary | ICD-10-CM | POA: Diagnosis not present

## 2016-04-11 DIAGNOSIS — I10 Essential (primary) hypertension: Secondary | ICD-10-CM | POA: Diagnosis not present

## 2016-04-11 DIAGNOSIS — E785 Hyperlipidemia, unspecified: Secondary | ICD-10-CM | POA: Diagnosis not present

## 2016-04-11 DIAGNOSIS — J449 Chronic obstructive pulmonary disease, unspecified: Secondary | ICD-10-CM | POA: Diagnosis not present

## 2016-04-14 ENCOUNTER — Other Ambulatory Visit: Payer: Self-pay | Admitting: Family Medicine

## 2016-04-14 DIAGNOSIS — J301 Allergic rhinitis due to pollen: Secondary | ICD-10-CM | POA: Diagnosis not present

## 2016-04-14 DIAGNOSIS — G309 Alzheimer's disease, unspecified: Secondary | ICD-10-CM | POA: Diagnosis not present

## 2016-04-14 DIAGNOSIS — I509 Heart failure, unspecified: Secondary | ICD-10-CM | POA: Diagnosis not present

## 2016-04-14 DIAGNOSIS — E785 Hyperlipidemia, unspecified: Secondary | ICD-10-CM | POA: Diagnosis not present

## 2016-04-14 DIAGNOSIS — N189 Chronic kidney disease, unspecified: Secondary | ICD-10-CM | POA: Diagnosis not present

## 2016-04-14 DIAGNOSIS — I10 Essential (primary) hypertension: Secondary | ICD-10-CM | POA: Diagnosis not present

## 2016-04-14 DIAGNOSIS — I2510 Atherosclerotic heart disease of native coronary artery without angina pectoris: Secondary | ICD-10-CM | POA: Diagnosis not present

## 2016-04-14 DIAGNOSIS — K219 Gastro-esophageal reflux disease without esophagitis: Secondary | ICD-10-CM | POA: Diagnosis not present

## 2016-04-14 DIAGNOSIS — J449 Chronic obstructive pulmonary disease, unspecified: Secondary | ICD-10-CM | POA: Diagnosis not present

## 2016-04-14 DIAGNOSIS — F339 Major depressive disorder, recurrent, unspecified: Secondary | ICD-10-CM | POA: Diagnosis not present

## 2016-04-15 DIAGNOSIS — N189 Chronic kidney disease, unspecified: Secondary | ICD-10-CM | POA: Diagnosis not present

## 2016-04-15 DIAGNOSIS — J449 Chronic obstructive pulmonary disease, unspecified: Secondary | ICD-10-CM | POA: Diagnosis not present

## 2016-04-15 DIAGNOSIS — I2510 Atherosclerotic heart disease of native coronary artery without angina pectoris: Secondary | ICD-10-CM | POA: Diagnosis not present

## 2016-04-15 DIAGNOSIS — E785 Hyperlipidemia, unspecified: Secondary | ICD-10-CM | POA: Diagnosis not present

## 2016-04-15 DIAGNOSIS — I509 Heart failure, unspecified: Secondary | ICD-10-CM | POA: Diagnosis not present

## 2016-04-15 DIAGNOSIS — I10 Essential (primary) hypertension: Secondary | ICD-10-CM | POA: Diagnosis not present

## 2016-04-18 ENCOUNTER — Telehealth: Payer: Self-pay | Admitting: Family Medicine

## 2016-04-18 DIAGNOSIS — N189 Chronic kidney disease, unspecified: Secondary | ICD-10-CM | POA: Diagnosis not present

## 2016-04-18 DIAGNOSIS — E785 Hyperlipidemia, unspecified: Secondary | ICD-10-CM | POA: Diagnosis not present

## 2016-04-18 DIAGNOSIS — I2510 Atherosclerotic heart disease of native coronary artery without angina pectoris: Secondary | ICD-10-CM | POA: Diagnosis not present

## 2016-04-18 DIAGNOSIS — I10 Essential (primary) hypertension: Secondary | ICD-10-CM | POA: Diagnosis not present

## 2016-04-18 DIAGNOSIS — I5032 Chronic diastolic (congestive) heart failure: Secondary | ICD-10-CM

## 2016-04-18 DIAGNOSIS — R0602 Shortness of breath: Secondary | ICD-10-CM

## 2016-04-18 DIAGNOSIS — I509 Heart failure, unspecified: Secondary | ICD-10-CM | POA: Diagnosis not present

## 2016-04-18 DIAGNOSIS — J449 Chronic obstructive pulmonary disease, unspecified: Secondary | ICD-10-CM | POA: Diagnosis not present

## 2016-04-18 MED ORDER — POTASSIUM CHLORIDE ER 10 MEQ PO TBCR: 10.0000 meq | EXTENDED_RELEASE_TABLET | Freq: Two times a day (BID) | ORAL | 5 refills | Status: DC

## 2016-04-18 NOTE — Telephone Encounter (Signed)
Patient's potassium prescribed by another MD. May we refill?

## 2016-04-18 NOTE — Telephone Encounter (Signed)
Spoke with Delton Prairie at [facility] and informed her per Dr.Scott Luking that we sent in refills on patient's potassium. Pam verbalized understanding.

## 2016-04-18 NOTE — Telephone Encounter (Signed)
Patient is running low on her potassium, however she has had a change in her directions.  She is needing a refill on this, potassium 10 milli equivalent, 2 times a day for 5 days a week and then 3 times a day for 2 days of the week.  [facility]

## 2016-04-18 NOTE — Telephone Encounter (Signed)
They do 6 months refill

## 2016-04-20 ENCOUNTER — Encounter: Payer: Self-pay | Admitting: Family Medicine

## 2016-04-21 ENCOUNTER — Other Ambulatory Visit: Payer: Self-pay

## 2016-04-21 ENCOUNTER — Other Ambulatory Visit: Payer: Self-pay | Admitting: Family Medicine

## 2016-04-21 DIAGNOSIS — I2510 Atherosclerotic heart disease of native coronary artery without angina pectoris: Secondary | ICD-10-CM | POA: Diagnosis not present

## 2016-04-21 DIAGNOSIS — I5032 Chronic diastolic (congestive) heart failure: Secondary | ICD-10-CM

## 2016-04-21 DIAGNOSIS — N189 Chronic kidney disease, unspecified: Secondary | ICD-10-CM | POA: Diagnosis not present

## 2016-04-21 DIAGNOSIS — I10 Essential (primary) hypertension: Secondary | ICD-10-CM | POA: Diagnosis not present

## 2016-04-21 DIAGNOSIS — R0602 Shortness of breath: Secondary | ICD-10-CM

## 2016-04-21 DIAGNOSIS — J449 Chronic obstructive pulmonary disease, unspecified: Secondary | ICD-10-CM | POA: Diagnosis not present

## 2016-04-21 DIAGNOSIS — E785 Hyperlipidemia, unspecified: Secondary | ICD-10-CM | POA: Diagnosis not present

## 2016-04-21 DIAGNOSIS — I509 Heart failure, unspecified: Secondary | ICD-10-CM | POA: Diagnosis not present

## 2016-04-21 MED ORDER — POTASSIUM CHLORIDE ER 10 MEQ PO TBCR: 10.0000 meq | EXTENDED_RELEASE_TABLET | Freq: Two times a day (BID) | ORAL | 5 refills | Status: DC

## 2016-04-22 ENCOUNTER — Other Ambulatory Visit: Payer: Self-pay

## 2016-04-22 ENCOUNTER — Telehealth: Payer: Self-pay | Admitting: Family Medicine

## 2016-04-22 DIAGNOSIS — I5032 Chronic diastolic (congestive) heart failure: Secondary | ICD-10-CM

## 2016-04-22 DIAGNOSIS — I2510 Atherosclerotic heart disease of native coronary artery without angina pectoris: Secondary | ICD-10-CM | POA: Diagnosis not present

## 2016-04-22 DIAGNOSIS — I10 Essential (primary) hypertension: Secondary | ICD-10-CM | POA: Diagnosis not present

## 2016-04-22 DIAGNOSIS — R0602 Shortness of breath: Secondary | ICD-10-CM

## 2016-04-22 DIAGNOSIS — I509 Heart failure, unspecified: Secondary | ICD-10-CM | POA: Diagnosis not present

## 2016-04-22 DIAGNOSIS — E785 Hyperlipidemia, unspecified: Secondary | ICD-10-CM | POA: Diagnosis not present

## 2016-04-22 DIAGNOSIS — N189 Chronic kidney disease, unspecified: Secondary | ICD-10-CM | POA: Diagnosis not present

## 2016-04-22 DIAGNOSIS — J449 Chronic obstructive pulmonary disease, unspecified: Secondary | ICD-10-CM | POA: Diagnosis not present

## 2016-04-22 MED ORDER — POTASSIUM CHLORIDE ER 10 MEQ PO TBCR: 10.0000 meq | EXTENDED_RELEASE_TABLET | Freq: Two times a day (BID) | ORAL | 5 refills | Status: DC

## 2016-04-22 NOTE — Telephone Encounter (Signed)
Last seen 03/07/16

## 2016-04-22 NOTE — Telephone Encounter (Signed)
May refill 3 

## 2016-04-22 NOTE — Telephone Encounter (Signed)
[name] said she went ahead and picked up the potassium that was already at the pharmacy on file, but there was supposed to be a new Rx with new directions sent over from the other day.

## 2016-04-22 NOTE — Telephone Encounter (Signed)
Left message return call 04/22/16 (called patient's pharmacy the prescription for potassium was picked up at pharmacy at 6:08 pm not sure why they say they have not gotten it)

## 2016-04-22 NOTE — Telephone Encounter (Signed)
Patients granddaughter Misty Stanley) says that the potassium has still not been sent to [facility] in [place].  She would like a call back because she doesn't want her to go all weekend without it.

## 2016-04-27 DIAGNOSIS — I10 Essential (primary) hypertension: Secondary | ICD-10-CM | POA: Diagnosis not present

## 2016-04-27 DIAGNOSIS — N189 Chronic kidney disease, unspecified: Secondary | ICD-10-CM | POA: Diagnosis not present

## 2016-04-27 DIAGNOSIS — J449 Chronic obstructive pulmonary disease, unspecified: Secondary | ICD-10-CM | POA: Diagnosis not present

## 2016-04-27 DIAGNOSIS — E785 Hyperlipidemia, unspecified: Secondary | ICD-10-CM | POA: Diagnosis not present

## 2016-04-27 DIAGNOSIS — I509 Heart failure, unspecified: Secondary | ICD-10-CM | POA: Diagnosis not present

## 2016-04-27 DIAGNOSIS — I2510 Atherosclerotic heart disease of native coronary artery without angina pectoris: Secondary | ICD-10-CM | POA: Diagnosis not present

## 2016-04-30 ENCOUNTER — Other Ambulatory Visit: Payer: Self-pay | Admitting: Family Medicine

## 2016-05-02 DIAGNOSIS — I509 Heart failure, unspecified: Secondary | ICD-10-CM | POA: Diagnosis not present

## 2016-05-02 DIAGNOSIS — I10 Essential (primary) hypertension: Secondary | ICD-10-CM | POA: Diagnosis not present

## 2016-05-02 DIAGNOSIS — N189 Chronic kidney disease, unspecified: Secondary | ICD-10-CM | POA: Diagnosis not present

## 2016-05-02 DIAGNOSIS — J449 Chronic obstructive pulmonary disease, unspecified: Secondary | ICD-10-CM | POA: Diagnosis not present

## 2016-05-02 DIAGNOSIS — I2510 Atherosclerotic heart disease of native coronary artery without angina pectoris: Secondary | ICD-10-CM | POA: Diagnosis not present

## 2016-05-02 DIAGNOSIS — E785 Hyperlipidemia, unspecified: Secondary | ICD-10-CM | POA: Diagnosis not present

## 2016-05-06 ENCOUNTER — Other Ambulatory Visit: Payer: Self-pay | Admitting: Family Medicine

## 2016-05-06 DIAGNOSIS — E785 Hyperlipidemia, unspecified: Secondary | ICD-10-CM | POA: Diagnosis not present

## 2016-05-06 DIAGNOSIS — N189 Chronic kidney disease, unspecified: Secondary | ICD-10-CM | POA: Diagnosis not present

## 2016-05-06 DIAGNOSIS — I10 Essential (primary) hypertension: Secondary | ICD-10-CM | POA: Diagnosis not present

## 2016-05-06 DIAGNOSIS — J449 Chronic obstructive pulmonary disease, unspecified: Secondary | ICD-10-CM | POA: Diagnosis not present

## 2016-05-06 DIAGNOSIS — I2510 Atherosclerotic heart disease of native coronary artery without angina pectoris: Secondary | ICD-10-CM | POA: Diagnosis not present

## 2016-05-06 DIAGNOSIS — I509 Heart failure, unspecified: Secondary | ICD-10-CM | POA: Diagnosis not present

## 2016-05-06 NOTE — Telephone Encounter (Signed)
May go ahead and refill it +5 additional refills

## 2016-05-09 DIAGNOSIS — I509 Heart failure, unspecified: Secondary | ICD-10-CM | POA: Diagnosis not present

## 2016-05-09 DIAGNOSIS — J449 Chronic obstructive pulmonary disease, unspecified: Secondary | ICD-10-CM | POA: Diagnosis not present

## 2016-05-09 DIAGNOSIS — E785 Hyperlipidemia, unspecified: Secondary | ICD-10-CM | POA: Diagnosis not present

## 2016-05-09 DIAGNOSIS — I2510 Atherosclerotic heart disease of native coronary artery without angina pectoris: Secondary | ICD-10-CM | POA: Diagnosis not present

## 2016-05-09 DIAGNOSIS — N189 Chronic kidney disease, unspecified: Secondary | ICD-10-CM | POA: Diagnosis not present

## 2016-05-09 DIAGNOSIS — I10 Essential (primary) hypertension: Secondary | ICD-10-CM | POA: Diagnosis not present

## 2016-05-12 ENCOUNTER — Other Ambulatory Visit: Payer: Self-pay | Admitting: Family Medicine

## 2016-05-12 DIAGNOSIS — N189 Chronic kidney disease, unspecified: Secondary | ICD-10-CM | POA: Diagnosis not present

## 2016-05-12 DIAGNOSIS — I2510 Atherosclerotic heart disease of native coronary artery without angina pectoris: Secondary | ICD-10-CM | POA: Diagnosis not present

## 2016-05-12 DIAGNOSIS — I509 Heart failure, unspecified: Secondary | ICD-10-CM | POA: Diagnosis not present

## 2016-05-12 DIAGNOSIS — E785 Hyperlipidemia, unspecified: Secondary | ICD-10-CM | POA: Diagnosis not present

## 2016-05-12 DIAGNOSIS — J449 Chronic obstructive pulmonary disease, unspecified: Secondary | ICD-10-CM | POA: Diagnosis not present

## 2016-05-12 DIAGNOSIS — I10 Essential (primary) hypertension: Secondary | ICD-10-CM | POA: Diagnosis not present

## 2016-05-13 DIAGNOSIS — N189 Chronic kidney disease, unspecified: Secondary | ICD-10-CM | POA: Diagnosis not present

## 2016-05-13 DIAGNOSIS — I10 Essential (primary) hypertension: Secondary | ICD-10-CM | POA: Diagnosis not present

## 2016-05-13 DIAGNOSIS — I509 Heart failure, unspecified: Secondary | ICD-10-CM | POA: Diagnosis not present

## 2016-05-13 DIAGNOSIS — I2510 Atherosclerotic heart disease of native coronary artery without angina pectoris: Secondary | ICD-10-CM | POA: Diagnosis not present

## 2016-05-13 DIAGNOSIS — E785 Hyperlipidemia, unspecified: Secondary | ICD-10-CM | POA: Diagnosis not present

## 2016-05-13 DIAGNOSIS — J449 Chronic obstructive pulmonary disease, unspecified: Secondary | ICD-10-CM | POA: Diagnosis not present

## 2016-05-15 DIAGNOSIS — I10 Essential (primary) hypertension: Secondary | ICD-10-CM | POA: Diagnosis not present

## 2016-05-15 DIAGNOSIS — K219 Gastro-esophageal reflux disease without esophagitis: Secondary | ICD-10-CM | POA: Diagnosis not present

## 2016-05-15 DIAGNOSIS — J449 Chronic obstructive pulmonary disease, unspecified: Secondary | ICD-10-CM | POA: Diagnosis not present

## 2016-05-15 DIAGNOSIS — N189 Chronic kidney disease, unspecified: Secondary | ICD-10-CM | POA: Diagnosis not present

## 2016-05-15 DIAGNOSIS — I509 Heart failure, unspecified: Secondary | ICD-10-CM | POA: Diagnosis not present

## 2016-05-15 DIAGNOSIS — E785 Hyperlipidemia, unspecified: Secondary | ICD-10-CM | POA: Diagnosis not present

## 2016-05-15 DIAGNOSIS — J301 Allergic rhinitis due to pollen: Secondary | ICD-10-CM | POA: Diagnosis not present

## 2016-05-15 DIAGNOSIS — I2510 Atherosclerotic heart disease of native coronary artery without angina pectoris: Secondary | ICD-10-CM | POA: Diagnosis not present

## 2016-05-15 DIAGNOSIS — F339 Major depressive disorder, recurrent, unspecified: Secondary | ICD-10-CM | POA: Diagnosis not present

## 2016-05-15 DIAGNOSIS — G309 Alzheimer's disease, unspecified: Secondary | ICD-10-CM | POA: Diagnosis not present

## 2016-05-16 ENCOUNTER — Telehealth: Payer: Self-pay | Admitting: Family Medicine

## 2016-05-16 DIAGNOSIS — N189 Chronic kidney disease, unspecified: Secondary | ICD-10-CM | POA: Diagnosis not present

## 2016-05-16 DIAGNOSIS — J449 Chronic obstructive pulmonary disease, unspecified: Secondary | ICD-10-CM | POA: Diagnosis not present

## 2016-05-16 DIAGNOSIS — E785 Hyperlipidemia, unspecified: Secondary | ICD-10-CM | POA: Diagnosis not present

## 2016-05-16 DIAGNOSIS — I10 Essential (primary) hypertension: Secondary | ICD-10-CM | POA: Diagnosis not present

## 2016-05-16 DIAGNOSIS — I2510 Atherosclerotic heart disease of native coronary artery without angina pectoris: Secondary | ICD-10-CM | POA: Diagnosis not present

## 2016-05-16 DIAGNOSIS — I509 Heart failure, unspecified: Secondary | ICD-10-CM | POA: Diagnosis not present

## 2016-05-16 NOTE — Telephone Encounter (Signed)
Prior Authorization Request received and Completed. Authorization not needed. Spoke with Carollee Herter at [facility] and she advised Hospice only covers 15 day supply. The Rx was amended and filled.

## 2016-05-19 DIAGNOSIS — I509 Heart failure, unspecified: Secondary | ICD-10-CM | POA: Diagnosis not present

## 2016-05-19 DIAGNOSIS — I2510 Atherosclerotic heart disease of native coronary artery without angina pectoris: Secondary | ICD-10-CM | POA: Diagnosis not present

## 2016-05-19 DIAGNOSIS — I10 Essential (primary) hypertension: Secondary | ICD-10-CM | POA: Diagnosis not present

## 2016-05-19 DIAGNOSIS — J449 Chronic obstructive pulmonary disease, unspecified: Secondary | ICD-10-CM | POA: Diagnosis not present

## 2016-05-19 DIAGNOSIS — E785 Hyperlipidemia, unspecified: Secondary | ICD-10-CM | POA: Diagnosis not present

## 2016-05-19 DIAGNOSIS — N189 Chronic kidney disease, unspecified: Secondary | ICD-10-CM | POA: Diagnosis not present

## 2016-05-20 DIAGNOSIS — H04121 Dry eye syndrome of right lacrimal gland: Secondary | ICD-10-CM | POA: Diagnosis not present

## 2016-05-20 DIAGNOSIS — J449 Chronic obstructive pulmonary disease, unspecified: Secondary | ICD-10-CM | POA: Diagnosis not present

## 2016-05-20 DIAGNOSIS — I10 Essential (primary) hypertension: Secondary | ICD-10-CM | POA: Diagnosis not present

## 2016-05-20 DIAGNOSIS — I2510 Atherosclerotic heart disease of native coronary artery without angina pectoris: Secondary | ICD-10-CM | POA: Diagnosis not present

## 2016-05-20 DIAGNOSIS — E785 Hyperlipidemia, unspecified: Secondary | ICD-10-CM | POA: Diagnosis not present

## 2016-05-20 DIAGNOSIS — H34832 Tributary (branch) retinal vein occlusion, left eye, with macular edema: Secondary | ICD-10-CM | POA: Diagnosis not present

## 2016-05-20 DIAGNOSIS — N189 Chronic kidney disease, unspecified: Secondary | ICD-10-CM | POA: Diagnosis not present

## 2016-05-20 DIAGNOSIS — I509 Heart failure, unspecified: Secondary | ICD-10-CM | POA: Diagnosis not present

## 2016-05-22 DIAGNOSIS — N189 Chronic kidney disease, unspecified: Secondary | ICD-10-CM | POA: Diagnosis not present

## 2016-05-22 DIAGNOSIS — I10 Essential (primary) hypertension: Secondary | ICD-10-CM | POA: Diagnosis not present

## 2016-05-22 DIAGNOSIS — I509 Heart failure, unspecified: Secondary | ICD-10-CM | POA: Diagnosis not present

## 2016-05-22 DIAGNOSIS — E785 Hyperlipidemia, unspecified: Secondary | ICD-10-CM | POA: Diagnosis not present

## 2016-05-22 DIAGNOSIS — J449 Chronic obstructive pulmonary disease, unspecified: Secondary | ICD-10-CM | POA: Diagnosis not present

## 2016-05-22 DIAGNOSIS — I2510 Atherosclerotic heart disease of native coronary artery without angina pectoris: Secondary | ICD-10-CM | POA: Diagnosis not present

## 2016-05-23 DIAGNOSIS — I10 Essential (primary) hypertension: Secondary | ICD-10-CM | POA: Diagnosis not present

## 2016-05-23 DIAGNOSIS — I509 Heart failure, unspecified: Secondary | ICD-10-CM | POA: Diagnosis not present

## 2016-05-23 DIAGNOSIS — J449 Chronic obstructive pulmonary disease, unspecified: Secondary | ICD-10-CM | POA: Diagnosis not present

## 2016-05-23 DIAGNOSIS — I2510 Atherosclerotic heart disease of native coronary artery without angina pectoris: Secondary | ICD-10-CM | POA: Diagnosis not present

## 2016-05-23 DIAGNOSIS — N189 Chronic kidney disease, unspecified: Secondary | ICD-10-CM | POA: Diagnosis not present

## 2016-05-23 DIAGNOSIS — E785 Hyperlipidemia, unspecified: Secondary | ICD-10-CM | POA: Diagnosis not present

## 2016-05-25 ENCOUNTER — Ambulatory Visit: Payer: Medicare Other | Admitting: Family Medicine

## 2016-05-25 DIAGNOSIS — N189 Chronic kidney disease, unspecified: Secondary | ICD-10-CM | POA: Diagnosis not present

## 2016-05-25 DIAGNOSIS — J449 Chronic obstructive pulmonary disease, unspecified: Secondary | ICD-10-CM | POA: Diagnosis not present

## 2016-05-25 DIAGNOSIS — I2510 Atherosclerotic heart disease of native coronary artery without angina pectoris: Secondary | ICD-10-CM | POA: Diagnosis not present

## 2016-05-25 DIAGNOSIS — E785 Hyperlipidemia, unspecified: Secondary | ICD-10-CM | POA: Diagnosis not present

## 2016-05-25 DIAGNOSIS — I509 Heart failure, unspecified: Secondary | ICD-10-CM | POA: Diagnosis not present

## 2016-05-25 DIAGNOSIS — I10 Essential (primary) hypertension: Secondary | ICD-10-CM | POA: Diagnosis not present

## 2016-05-27 ENCOUNTER — Ambulatory Visit: Payer: Medicare Other | Admitting: Family Medicine

## 2016-05-30 DIAGNOSIS — J449 Chronic obstructive pulmonary disease, unspecified: Secondary | ICD-10-CM | POA: Diagnosis not present

## 2016-05-30 DIAGNOSIS — I2510 Atherosclerotic heart disease of native coronary artery without angina pectoris: Secondary | ICD-10-CM | POA: Diagnosis not present

## 2016-05-30 DIAGNOSIS — I509 Heart failure, unspecified: Secondary | ICD-10-CM | POA: Diagnosis not present

## 2016-05-30 DIAGNOSIS — E785 Hyperlipidemia, unspecified: Secondary | ICD-10-CM | POA: Diagnosis not present

## 2016-05-30 DIAGNOSIS — N189 Chronic kidney disease, unspecified: Secondary | ICD-10-CM | POA: Diagnosis not present

## 2016-05-30 DIAGNOSIS — I10 Essential (primary) hypertension: Secondary | ICD-10-CM | POA: Diagnosis not present

## 2016-06-02 ENCOUNTER — Encounter: Payer: Self-pay | Admitting: Family Medicine

## 2016-06-02 ENCOUNTER — Ambulatory Visit (INDEPENDENT_AMBULATORY_CARE_PROVIDER_SITE_OTHER): Payer: Medicare Other | Admitting: Family Medicine

## 2016-06-02 VITALS — BP 126/80 | Ht 63.0 in | Wt 165.0 lb

## 2016-06-02 DIAGNOSIS — D692 Other nonthrombocytopenic purpura: Secondary | ICD-10-CM | POA: Diagnosis not present

## 2016-06-02 DIAGNOSIS — J449 Chronic obstructive pulmonary disease, unspecified: Secondary | ICD-10-CM | POA: Diagnosis not present

## 2016-06-02 DIAGNOSIS — I2510 Atherosclerotic heart disease of native coronary artery without angina pectoris: Secondary | ICD-10-CM | POA: Diagnosis not present

## 2016-06-02 DIAGNOSIS — E785 Hyperlipidemia, unspecified: Secondary | ICD-10-CM | POA: Diagnosis not present

## 2016-06-02 DIAGNOSIS — I5032 Chronic diastolic (congestive) heart failure: Secondary | ICD-10-CM

## 2016-06-02 DIAGNOSIS — I5042 Chronic combined systolic (congestive) and diastolic (congestive) heart failure: Secondary | ICD-10-CM | POA: Diagnosis not present

## 2016-06-02 DIAGNOSIS — I509 Heart failure, unspecified: Secondary | ICD-10-CM | POA: Diagnosis not present

## 2016-06-02 DIAGNOSIS — N189 Chronic kidney disease, unspecified: Secondary | ICD-10-CM | POA: Diagnosis not present

## 2016-06-02 DIAGNOSIS — I10 Essential (primary) hypertension: Secondary | ICD-10-CM | POA: Diagnosis not present

## 2016-06-02 MED ORDER — ALBUTEROL SULFATE HFA 108 (90 BASE) MCG/ACT IN AERS: 2.0000 | INHALATION_SPRAY | RESPIRATORY_TRACT | 12 refills | Status: DC | PRN

## 2016-06-02 MED ORDER — AMOXICILLIN 500 MG PO TABS: 500.0000 mg | ORAL_TABLET | Freq: Three times a day (TID) | ORAL | 0 refills | Status: DC

## 2016-06-02 MED ORDER — PREDNISONE 20 MG PO TABS: ORAL_TABLET | ORAL | 0 refills | Status: DC

## 2016-06-02 NOTE — Progress Notes (Signed)
   Subjective:    Patient ID: [name], female    DOB: 01-25-1936, 81 y.o.   MRN: [id number]  Hypertension  This is a chronic problem. The current episode started more than 1 year ago. Pertinent negatives include no chest pain or shortness of breath. Risk factors for coronary artery disease include post-menopausal state and sedentary lifestyle. Treatments tried: losartan,metoprolol,lasix, norvasc. There are no compliance problems.     Patient reports continued SOB with exertion. Patient has history of heart failure she is under current approach to try to monitor her blood infrequently with comfort care measures.  She had a recent fall lost her balance and injured her arm skin tear it seems to be healing  She has COPD she needs a refill on her inhaler-having a flareup with increased coughing congestion bringing up some phlegm denies any severe shortness of breath uses her oxygen accordingly Reflux good control Patient is also on Aricept Takes her cholesterol medicine family would like to have little intervention in regards to lab work unless necessary Review of Systems  Constitutional: Negative for activity change and fever.  HENT: Positive for congestion and rhinorrhea. Negative for ear pain.   Eyes: Negative for discharge.  Respiratory: Positive for cough. Negative for shortness of breath and wheezing.   Cardiovascular: Negative for chest pain.       Objective:   Physical Exam  Constitutional: She appears well-developed.  HENT:  Head: Normocephalic.  Nose: Nose normal.  Mouth/Throat: Oropharynx is clear and moist. No oropharyngeal exudate.  Neck: Neck supple.  Cardiovascular: Normal rate and normal heart sounds.   No murmur heard. Pulmonary/Chest: Effort normal and breath sounds normal. She has no wheezes.  Lymphadenopathy:    She has no cervical adenopathy.  Skin: Skin is warm and dry.  Nursing note and vitals reviewed.  Senile purpura noted  Skin tear  noted   25 minutes was spent with the patient. Greater than half the time was spent in discussion and answering questions and counseling regarding the issues that the patient came in for today.     Assessment & Plan:  Skin tear should gradually heal Senile purpura stable COPD mild exacerbation albuterol prednisone taper if progressive symptoms over depend neck daily's amoxicillin 10 days Heart failure stable continue current measures Dementia he has stable continue current measures Hyperlipidemia previous labs reviewed continue current measures

## 2016-06-03 DIAGNOSIS — I509 Heart failure, unspecified: Secondary | ICD-10-CM | POA: Diagnosis not present

## 2016-06-03 DIAGNOSIS — I10 Essential (primary) hypertension: Secondary | ICD-10-CM | POA: Diagnosis not present

## 2016-06-03 DIAGNOSIS — J449 Chronic obstructive pulmonary disease, unspecified: Secondary | ICD-10-CM | POA: Diagnosis not present

## 2016-06-03 DIAGNOSIS — E785 Hyperlipidemia, unspecified: Secondary | ICD-10-CM | POA: Diagnosis not present

## 2016-06-03 DIAGNOSIS — N189 Chronic kidney disease, unspecified: Secondary | ICD-10-CM | POA: Diagnosis not present

## 2016-06-03 DIAGNOSIS — I2510 Atherosclerotic heart disease of native coronary artery without angina pectoris: Secondary | ICD-10-CM | POA: Diagnosis not present

## 2016-06-05 DIAGNOSIS — N189 Chronic kidney disease, unspecified: Secondary | ICD-10-CM | POA: Diagnosis not present

## 2016-06-05 DIAGNOSIS — J449 Chronic obstructive pulmonary disease, unspecified: Secondary | ICD-10-CM | POA: Diagnosis not present

## 2016-06-05 DIAGNOSIS — I509 Heart failure, unspecified: Secondary | ICD-10-CM | POA: Diagnosis not present

## 2016-06-05 DIAGNOSIS — I2510 Atherosclerotic heart disease of native coronary artery without angina pectoris: Secondary | ICD-10-CM | POA: Diagnosis not present

## 2016-06-05 DIAGNOSIS — E785 Hyperlipidemia, unspecified: Secondary | ICD-10-CM | POA: Diagnosis not present

## 2016-06-05 DIAGNOSIS — I10 Essential (primary) hypertension: Secondary | ICD-10-CM | POA: Diagnosis not present

## 2016-06-06 DIAGNOSIS — N189 Chronic kidney disease, unspecified: Secondary | ICD-10-CM | POA: Diagnosis not present

## 2016-06-06 DIAGNOSIS — E785 Hyperlipidemia, unspecified: Secondary | ICD-10-CM | POA: Diagnosis not present

## 2016-06-06 DIAGNOSIS — I509 Heart failure, unspecified: Secondary | ICD-10-CM | POA: Diagnosis not present

## 2016-06-06 DIAGNOSIS — I2510 Atherosclerotic heart disease of native coronary artery without angina pectoris: Secondary | ICD-10-CM | POA: Diagnosis not present

## 2016-06-06 DIAGNOSIS — J449 Chronic obstructive pulmonary disease, unspecified: Secondary | ICD-10-CM | POA: Diagnosis not present

## 2016-06-06 DIAGNOSIS — I10 Essential (primary) hypertension: Secondary | ICD-10-CM | POA: Diagnosis not present

## 2016-06-08 DIAGNOSIS — I509 Heart failure, unspecified: Secondary | ICD-10-CM | POA: Diagnosis not present

## 2016-06-08 DIAGNOSIS — J449 Chronic obstructive pulmonary disease, unspecified: Secondary | ICD-10-CM | POA: Diagnosis not present

## 2016-06-08 DIAGNOSIS — E785 Hyperlipidemia, unspecified: Secondary | ICD-10-CM | POA: Diagnosis not present

## 2016-06-08 DIAGNOSIS — I2510 Atherosclerotic heart disease of native coronary artery without angina pectoris: Secondary | ICD-10-CM | POA: Diagnosis not present

## 2016-06-08 DIAGNOSIS — I10 Essential (primary) hypertension: Secondary | ICD-10-CM | POA: Diagnosis not present

## 2016-06-08 DIAGNOSIS — N189 Chronic kidney disease, unspecified: Secondary | ICD-10-CM | POA: Diagnosis not present

## 2016-06-11 DIAGNOSIS — I10 Essential (primary) hypertension: Secondary | ICD-10-CM | POA: Diagnosis not present

## 2016-06-11 DIAGNOSIS — I2510 Atherosclerotic heart disease of native coronary artery without angina pectoris: Secondary | ICD-10-CM | POA: Diagnosis not present

## 2016-06-11 DIAGNOSIS — N189 Chronic kidney disease, unspecified: Secondary | ICD-10-CM | POA: Diagnosis not present

## 2016-06-11 DIAGNOSIS — E785 Hyperlipidemia, unspecified: Secondary | ICD-10-CM | POA: Diagnosis not present

## 2016-06-11 DIAGNOSIS — J449 Chronic obstructive pulmonary disease, unspecified: Secondary | ICD-10-CM | POA: Diagnosis not present

## 2016-06-11 DIAGNOSIS — I509 Heart failure, unspecified: Secondary | ICD-10-CM | POA: Diagnosis not present

## 2016-06-13 DIAGNOSIS — E785 Hyperlipidemia, unspecified: Secondary | ICD-10-CM | POA: Diagnosis not present

## 2016-06-13 DIAGNOSIS — N189 Chronic kidney disease, unspecified: Secondary | ICD-10-CM | POA: Diagnosis not present

## 2016-06-13 DIAGNOSIS — I2510 Atherosclerotic heart disease of native coronary artery without angina pectoris: Secondary | ICD-10-CM | POA: Diagnosis not present

## 2016-06-13 DIAGNOSIS — I10 Essential (primary) hypertension: Secondary | ICD-10-CM | POA: Diagnosis not present

## 2016-06-13 DIAGNOSIS — I509 Heart failure, unspecified: Secondary | ICD-10-CM | POA: Diagnosis not present

## 2016-06-13 DIAGNOSIS — J449 Chronic obstructive pulmonary disease, unspecified: Secondary | ICD-10-CM | POA: Diagnosis not present

## 2016-06-14 DIAGNOSIS — I2510 Atherosclerotic heart disease of native coronary artery without angina pectoris: Secondary | ICD-10-CM | POA: Diagnosis not present

## 2016-06-14 DIAGNOSIS — J301 Allergic rhinitis due to pollen: Secondary | ICD-10-CM | POA: Diagnosis not present

## 2016-06-14 DIAGNOSIS — E785 Hyperlipidemia, unspecified: Secondary | ICD-10-CM | POA: Diagnosis not present

## 2016-06-14 DIAGNOSIS — N189 Chronic kidney disease, unspecified: Secondary | ICD-10-CM | POA: Diagnosis not present

## 2016-06-14 DIAGNOSIS — K219 Gastro-esophageal reflux disease without esophagitis: Secondary | ICD-10-CM | POA: Diagnosis not present

## 2016-06-14 DIAGNOSIS — F339 Major depressive disorder, recurrent, unspecified: Secondary | ICD-10-CM | POA: Diagnosis not present

## 2016-06-14 DIAGNOSIS — J449 Chronic obstructive pulmonary disease, unspecified: Secondary | ICD-10-CM | POA: Diagnosis not present

## 2016-06-14 DIAGNOSIS — I10 Essential (primary) hypertension: Secondary | ICD-10-CM | POA: Diagnosis not present

## 2016-06-14 DIAGNOSIS — I509 Heart failure, unspecified: Secondary | ICD-10-CM | POA: Diagnosis not present

## 2016-06-14 DIAGNOSIS — G309 Alzheimer's disease, unspecified: Secondary | ICD-10-CM | POA: Diagnosis not present

## 2016-06-15 ENCOUNTER — Other Ambulatory Visit: Payer: Self-pay | Admitting: Family Medicine

## 2016-06-16 ENCOUNTER — Encounter: Payer: Self-pay | Admitting: Family Medicine

## 2016-06-16 ENCOUNTER — Ambulatory Visit (INDEPENDENT_AMBULATORY_CARE_PROVIDER_SITE_OTHER): Payer: Medicare Other | Admitting: Family Medicine

## 2016-06-16 VITALS — BP 122/74 | Ht 63.0 in | Wt 167.0 lb

## 2016-06-16 DIAGNOSIS — R001 Bradycardia, unspecified: Secondary | ICD-10-CM | POA: Diagnosis not present

## 2016-06-16 DIAGNOSIS — M545 Low back pain, unspecified: Secondary | ICD-10-CM

## 2016-06-16 DIAGNOSIS — J449 Chronic obstructive pulmonary disease, unspecified: Secondary | ICD-10-CM

## 2016-06-16 DIAGNOSIS — I5032 Chronic diastolic (congestive) heart failure: Secondary | ICD-10-CM

## 2016-06-16 MED ORDER — PREDNISONE 20 MG PO TABS: ORAL_TABLET | ORAL | 0 refills | Status: DC

## 2016-06-16 NOTE — Progress Notes (Signed)
   Subjective:    Patient ID: [name], female    DOB: 04/18/1935, 81 y.o.   MRN: [id number]  Back Pain  This is a new problem. Episode onset: 5 days. The pain is present in the lumbar spine. Treatments tried: bio freeze, tylenol, advil.   Wheezing. Getting worse.  Patient has history of COPD and CHF she is under the care of hospice. Family desires not to do lab work x-rays or hospitalization. Patient relates her O2 sats ration 3 in the 80s on the current moment it is 91% on 3 L.  Review of Systems  Musculoskeletal: Positive for back pain.   No fevers chills does have coughing congestion no swelling in the legs    Objective:   Physical Exam  Lungs minimal expiratory wheezes heart regular HEENT is benign Extremities no edema Low back subjective discomfort      Assessment & Plan:  Hospice patient  Chronic CHF stable  Bradycardia reduce metoprolol to half tablet twice daily  COPD exacerbation prednisone taper 4 L of O2 per nasal cannula, 5 L if necessary try to keep O2 sat in the low 90s  Morphine use permissible per hospice guidelines  Lumbar pain Tylenol when necessary if worsens may need to do x-rays  Follow-up when necessary

## 2016-06-21 DIAGNOSIS — E785 Hyperlipidemia, unspecified: Secondary | ICD-10-CM | POA: Diagnosis not present

## 2016-06-21 DIAGNOSIS — I509 Heart failure, unspecified: Secondary | ICD-10-CM | POA: Diagnosis not present

## 2016-06-21 DIAGNOSIS — I10 Essential (primary) hypertension: Secondary | ICD-10-CM | POA: Diagnosis not present

## 2016-06-21 DIAGNOSIS — J449 Chronic obstructive pulmonary disease, unspecified: Secondary | ICD-10-CM | POA: Diagnosis not present

## 2016-06-21 DIAGNOSIS — I2510 Atherosclerotic heart disease of native coronary artery without angina pectoris: Secondary | ICD-10-CM | POA: Diagnosis not present

## 2016-06-21 DIAGNOSIS — N189 Chronic kidney disease, unspecified: Secondary | ICD-10-CM | POA: Diagnosis not present

## 2016-06-22 DIAGNOSIS — N189 Chronic kidney disease, unspecified: Secondary | ICD-10-CM | POA: Diagnosis not present

## 2016-06-22 DIAGNOSIS — E785 Hyperlipidemia, unspecified: Secondary | ICD-10-CM | POA: Diagnosis not present

## 2016-06-22 DIAGNOSIS — I2510 Atherosclerotic heart disease of native coronary artery without angina pectoris: Secondary | ICD-10-CM | POA: Diagnosis not present

## 2016-06-22 DIAGNOSIS — J449 Chronic obstructive pulmonary disease, unspecified: Secondary | ICD-10-CM | POA: Diagnosis not present

## 2016-06-22 DIAGNOSIS — I509 Heart failure, unspecified: Secondary | ICD-10-CM | POA: Diagnosis not present

## 2016-06-22 DIAGNOSIS — I10 Essential (primary) hypertension: Secondary | ICD-10-CM | POA: Diagnosis not present

## 2016-06-24 DIAGNOSIS — J449 Chronic obstructive pulmonary disease, unspecified: Secondary | ICD-10-CM | POA: Diagnosis not present

## 2016-06-24 DIAGNOSIS — I10 Essential (primary) hypertension: Secondary | ICD-10-CM | POA: Diagnosis not present

## 2016-06-24 DIAGNOSIS — I509 Heart failure, unspecified: Secondary | ICD-10-CM | POA: Diagnosis not present

## 2016-06-24 DIAGNOSIS — N189 Chronic kidney disease, unspecified: Secondary | ICD-10-CM | POA: Diagnosis not present

## 2016-06-24 DIAGNOSIS — E785 Hyperlipidemia, unspecified: Secondary | ICD-10-CM | POA: Diagnosis not present

## 2016-06-24 DIAGNOSIS — H34832 Tributary (branch) retinal vein occlusion, left eye, with macular edema: Secondary | ICD-10-CM | POA: Diagnosis not present

## 2016-06-24 DIAGNOSIS — I2510 Atherosclerotic heart disease of native coronary artery without angina pectoris: Secondary | ICD-10-CM | POA: Diagnosis not present

## 2016-06-24 DIAGNOSIS — S0501XA Injury of conjunctiva and corneal abrasion without foreign body, right eye, initial encounter: Secondary | ICD-10-CM | POA: Diagnosis not present

## 2016-06-28 DIAGNOSIS — I2510 Atherosclerotic heart disease of native coronary artery without angina pectoris: Secondary | ICD-10-CM | POA: Diagnosis not present

## 2016-06-28 DIAGNOSIS — N189 Chronic kidney disease, unspecified: Secondary | ICD-10-CM | POA: Diagnosis not present

## 2016-06-28 DIAGNOSIS — I509 Heart failure, unspecified: Secondary | ICD-10-CM | POA: Diagnosis not present

## 2016-06-28 DIAGNOSIS — I10 Essential (primary) hypertension: Secondary | ICD-10-CM | POA: Diagnosis not present

## 2016-06-28 DIAGNOSIS — J449 Chronic obstructive pulmonary disease, unspecified: Secondary | ICD-10-CM | POA: Diagnosis not present

## 2016-06-28 DIAGNOSIS — E785 Hyperlipidemia, unspecified: Secondary | ICD-10-CM | POA: Diagnosis not present

## 2016-06-29 DIAGNOSIS — J449 Chronic obstructive pulmonary disease, unspecified: Secondary | ICD-10-CM | POA: Diagnosis not present

## 2016-06-29 DIAGNOSIS — I2510 Atherosclerotic heart disease of native coronary artery without angina pectoris: Secondary | ICD-10-CM | POA: Diagnosis not present

## 2016-06-29 DIAGNOSIS — E785 Hyperlipidemia, unspecified: Secondary | ICD-10-CM | POA: Diagnosis not present

## 2016-06-29 DIAGNOSIS — I509 Heart failure, unspecified: Secondary | ICD-10-CM | POA: Diagnosis not present

## 2016-06-29 DIAGNOSIS — I10 Essential (primary) hypertension: Secondary | ICD-10-CM | POA: Diagnosis not present

## 2016-06-29 DIAGNOSIS — N189 Chronic kidney disease, unspecified: Secondary | ICD-10-CM | POA: Diagnosis not present

## 2016-07-05 DIAGNOSIS — I10 Essential (primary) hypertension: Secondary | ICD-10-CM | POA: Diagnosis not present

## 2016-07-05 DIAGNOSIS — N189 Chronic kidney disease, unspecified: Secondary | ICD-10-CM | POA: Diagnosis not present

## 2016-07-05 DIAGNOSIS — J449 Chronic obstructive pulmonary disease, unspecified: Secondary | ICD-10-CM | POA: Diagnosis not present

## 2016-07-05 DIAGNOSIS — E785 Hyperlipidemia, unspecified: Secondary | ICD-10-CM | POA: Diagnosis not present

## 2016-07-05 DIAGNOSIS — I509 Heart failure, unspecified: Secondary | ICD-10-CM | POA: Diagnosis not present

## 2016-07-05 DIAGNOSIS — I2510 Atherosclerotic heart disease of native coronary artery without angina pectoris: Secondary | ICD-10-CM | POA: Diagnosis not present

## 2016-07-06 DIAGNOSIS — I509 Heart failure, unspecified: Secondary | ICD-10-CM | POA: Diagnosis not present

## 2016-07-06 DIAGNOSIS — N189 Chronic kidney disease, unspecified: Secondary | ICD-10-CM | POA: Diagnosis not present

## 2016-07-06 DIAGNOSIS — J449 Chronic obstructive pulmonary disease, unspecified: Secondary | ICD-10-CM | POA: Diagnosis not present

## 2016-07-06 DIAGNOSIS — I2510 Atherosclerotic heart disease of native coronary artery without angina pectoris: Secondary | ICD-10-CM | POA: Diagnosis not present

## 2016-07-06 DIAGNOSIS — E785 Hyperlipidemia, unspecified: Secondary | ICD-10-CM | POA: Diagnosis not present

## 2016-07-06 DIAGNOSIS — I10 Essential (primary) hypertension: Secondary | ICD-10-CM | POA: Diagnosis not present

## 2016-07-07 DIAGNOSIS — J449 Chronic obstructive pulmonary disease, unspecified: Secondary | ICD-10-CM | POA: Diagnosis not present

## 2016-07-07 DIAGNOSIS — I2510 Atherosclerotic heart disease of native coronary artery without angina pectoris: Secondary | ICD-10-CM | POA: Diagnosis not present

## 2016-07-07 DIAGNOSIS — I10 Essential (primary) hypertension: Secondary | ICD-10-CM | POA: Diagnosis not present

## 2016-07-07 DIAGNOSIS — N189 Chronic kidney disease, unspecified: Secondary | ICD-10-CM | POA: Diagnosis not present

## 2016-07-07 DIAGNOSIS — I509 Heart failure, unspecified: Secondary | ICD-10-CM | POA: Diagnosis not present

## 2016-07-07 DIAGNOSIS — E785 Hyperlipidemia, unspecified: Secondary | ICD-10-CM | POA: Diagnosis not present

## 2016-07-13 DIAGNOSIS — J449 Chronic obstructive pulmonary disease, unspecified: Secondary | ICD-10-CM | POA: Diagnosis not present

## 2016-07-13 DIAGNOSIS — E785 Hyperlipidemia, unspecified: Secondary | ICD-10-CM | POA: Diagnosis not present

## 2016-07-13 DIAGNOSIS — I10 Essential (primary) hypertension: Secondary | ICD-10-CM | POA: Diagnosis not present

## 2016-07-13 DIAGNOSIS — I2510 Atherosclerotic heart disease of native coronary artery without angina pectoris: Secondary | ICD-10-CM | POA: Diagnosis not present

## 2016-07-13 DIAGNOSIS — I509 Heart failure, unspecified: Secondary | ICD-10-CM | POA: Diagnosis not present

## 2016-07-13 DIAGNOSIS — N189 Chronic kidney disease, unspecified: Secondary | ICD-10-CM | POA: Diagnosis not present

## 2016-07-14 ENCOUNTER — Telehealth: Payer: Self-pay | Admitting: Family Medicine

## 2016-07-14 NOTE — Telephone Encounter (Signed)
Patients granddaughter called back and wanted to let the nurse know that she understands what the nurse is saying now.  She is OK with the increase to 120 mg twice a day.

## 2016-07-14 NOTE — Telephone Encounter (Signed)
[name] states she doesn't think the Lasix is working anymore- patient does metolazone 2.5 mg on tues and sat(started by cardiologist) and wonders if that could be increased instead.

## 2016-07-14 NOTE — Telephone Encounter (Signed)
I would recommend increasing the Lasix to 3 tablets twice daily. I'd also recommend a follow-up office visit ideally in the near future.(I will be out of the office next week) the goal is to get her weight back down closer to 170

## 2016-07-14 NOTE — Telephone Encounter (Signed)
Patient on Lasix 40 mg  2 tablets BID currently

## 2016-07-14 NOTE — Telephone Encounter (Signed)
Patient has gained up to 177 pounds from 168 and she is tight in her chest in the past week.  I offered an appointment for [name] to bring her in today, but she doesn't think [name] can bring her in today by himself and no one else can come.  Kennyth Arnold is wanting to know if she can increase her diuretic?  She is giving her morphine more continuously trying to keep her comfortable, doesn't think the lasix is doing it anymore.

## 2016-07-15 DIAGNOSIS — I10 Essential (primary) hypertension: Secondary | ICD-10-CM | POA: Diagnosis not present

## 2016-07-15 DIAGNOSIS — G309 Alzheimer's disease, unspecified: Secondary | ICD-10-CM | POA: Diagnosis not present

## 2016-07-15 DIAGNOSIS — N189 Chronic kidney disease, unspecified: Secondary | ICD-10-CM | POA: Diagnosis not present

## 2016-07-15 DIAGNOSIS — F339 Major depressive disorder, recurrent, unspecified: Secondary | ICD-10-CM | POA: Diagnosis not present

## 2016-07-15 DIAGNOSIS — E785 Hyperlipidemia, unspecified: Secondary | ICD-10-CM | POA: Diagnosis not present

## 2016-07-15 DIAGNOSIS — K219 Gastro-esophageal reflux disease without esophagitis: Secondary | ICD-10-CM | POA: Diagnosis not present

## 2016-07-15 DIAGNOSIS — I509 Heart failure, unspecified: Secondary | ICD-10-CM | POA: Diagnosis not present

## 2016-07-15 DIAGNOSIS — J449 Chronic obstructive pulmonary disease, unspecified: Secondary | ICD-10-CM | POA: Diagnosis not present

## 2016-07-15 DIAGNOSIS — J301 Allergic rhinitis due to pollen: Secondary | ICD-10-CM | POA: Diagnosis not present

## 2016-07-15 DIAGNOSIS — I2510 Atherosclerotic heart disease of native coronary artery without angina pectoris: Secondary | ICD-10-CM | POA: Diagnosis not present

## 2016-07-15 MED ORDER — FUROSEMIDE 40 MG PO TABS: ORAL_TABLET | ORAL | 0 refills | Status: DC

## 2016-07-15 NOTE — Telephone Encounter (Signed)
Since she is fine with the original recommendation please go with the original recommendation-regarding increasing furosemide

## 2016-07-15 NOTE — Telephone Encounter (Signed)
Prescription sent electronically to pharmacy. POA notified.

## 2016-07-18 ENCOUNTER — Ambulatory Visit (INDEPENDENT_AMBULATORY_CARE_PROVIDER_SITE_OTHER): Payer: Medicare Other | Admitting: Family Medicine

## 2016-07-18 ENCOUNTER — Other Ambulatory Visit: Payer: Self-pay | Admitting: Family Medicine

## 2016-07-18 ENCOUNTER — Encounter: Payer: Self-pay | Admitting: Family Medicine

## 2016-07-18 VITALS — BP 140/66 | Ht 63.0 in | Wt 174.1 lb

## 2016-07-18 DIAGNOSIS — R0602 Shortness of breath: Secondary | ICD-10-CM | POA: Diagnosis not present

## 2016-07-18 DIAGNOSIS — J449 Chronic obstructive pulmonary disease, unspecified: Secondary | ICD-10-CM

## 2016-07-18 DIAGNOSIS — E785 Hyperlipidemia, unspecified: Secondary | ICD-10-CM | POA: Diagnosis not present

## 2016-07-18 DIAGNOSIS — I509 Heart failure, unspecified: Secondary | ICD-10-CM | POA: Diagnosis not present

## 2016-07-18 DIAGNOSIS — I2510 Atherosclerotic heart disease of native coronary artery without angina pectoris: Secondary | ICD-10-CM | POA: Diagnosis not present

## 2016-07-18 DIAGNOSIS — I10 Essential (primary) hypertension: Secondary | ICD-10-CM | POA: Diagnosis not present

## 2016-07-18 DIAGNOSIS — N189 Chronic kidney disease, unspecified: Secondary | ICD-10-CM | POA: Diagnosis not present

## 2016-07-18 MED ORDER — POTASSIUM CHLORIDE CRYS ER 20 MEQ PO TBCR: 20.0000 meq | EXTENDED_RELEASE_TABLET | Freq: Two times a day (BID) | ORAL | 5 refills | Status: DC

## 2016-07-18 NOTE — Telephone Encounter (Signed)
Yes six mo worth all three

## 2016-07-18 NOTE — Progress Notes (Signed)
   Subjective:    Patient ID: [name], female    DOB: 1935-08-30, 81 y.o.   MRN: [id number]  HPI Patient in today for fluid overload. Patient's weight due to fluid was over 180 with increase in lasix (120 mg twice a day) fluid is starting to decrease.   Has lost Weight down about 7 pounds from last week.  History of borderline potassium family wonders if needs to increase potassium tablet.  Patient noticing less exertional dyspnea at this time. Next  Of note in hospice with advanced CHF and COPD  States no other concerns this visit.    Review of Systems No headache, no major weight loss or weight gain, no chest pain no back pain abdominal pain no change in bowel habits complete ROS otherwise negative     Objective:   Physical Exam Alert active no acute distress. No noticeable tachypnea. Talking in full sentences oxygen present lungs breath sounds diffusely diminished no crackles slight wheeze on expiration heart rare rhythm ankles no edema       Assessment & Plan:  Impression CHF clinically improved plan increase potassium to 20 mg twice a day. Maintain same dose of Lasix. Follow-up with Dr. Lorin Picket as directed maintain low salt intake

## 2016-07-18 NOTE — Telephone Encounter (Signed)
Seen in office today. May we refill

## 2016-07-19 DIAGNOSIS — J449 Chronic obstructive pulmonary disease, unspecified: Secondary | ICD-10-CM | POA: Diagnosis not present

## 2016-07-19 DIAGNOSIS — I2510 Atherosclerotic heart disease of native coronary artery without angina pectoris: Secondary | ICD-10-CM | POA: Diagnosis not present

## 2016-07-19 DIAGNOSIS — N189 Chronic kidney disease, unspecified: Secondary | ICD-10-CM | POA: Diagnosis not present

## 2016-07-19 DIAGNOSIS — I509 Heart failure, unspecified: Secondary | ICD-10-CM | POA: Diagnosis not present

## 2016-07-19 DIAGNOSIS — E785 Hyperlipidemia, unspecified: Secondary | ICD-10-CM | POA: Diagnosis not present

## 2016-07-19 DIAGNOSIS — I10 Essential (primary) hypertension: Secondary | ICD-10-CM | POA: Diagnosis not present

## 2016-07-20 ENCOUNTER — Telehealth: Payer: Self-pay | Admitting: Cardiovascular Disease

## 2016-07-20 DIAGNOSIS — J449 Chronic obstructive pulmonary disease, unspecified: Secondary | ICD-10-CM

## 2016-07-20 DIAGNOSIS — I10 Essential (primary) hypertension: Secondary | ICD-10-CM | POA: Diagnosis not present

## 2016-07-20 DIAGNOSIS — E785 Hyperlipidemia, unspecified: Secondary | ICD-10-CM | POA: Diagnosis not present

## 2016-07-20 DIAGNOSIS — I509 Heart failure, unspecified: Secondary | ICD-10-CM | POA: Diagnosis not present

## 2016-07-20 DIAGNOSIS — I5033 Acute on chronic diastolic (congestive) heart failure: Secondary | ICD-10-CM

## 2016-07-20 DIAGNOSIS — I2510 Atherosclerotic heart disease of native coronary artery without angina pectoris: Secondary | ICD-10-CM | POA: Diagnosis not present

## 2016-07-20 DIAGNOSIS — N189 Chronic kidney disease, unspecified: Secondary | ICD-10-CM | POA: Diagnosis not present

## 2016-07-20 NOTE — Telephone Encounter (Signed)
I spoke with [name] the pt's grand-daughter and the previous information was incorrect.   The pt's normal weight is 166-168 lbs.  She said last week the pt's weight reached 180 lbs, the pt was gurgling when breathing and her face was swollen.  The pt's normal medication doses are Furosemide 80mg  twice a day, Potassium 10 mEq twice a day except on Metolazone days she takes 10 mEq three times per day.  The pt takes Metolazone 2.5mg  on Tuesday and Saturday. The pt was advised by PCP on Friday to increase Furosemide to 120mg  twice a day and Potassium to 20 mEq twice a day.  Per [name] the pt's breathing has improved and her weight is down to 170 lbs. Hospice contacted our office to help determine how the pt's medications should be managed at this time.

## 2016-07-20 NOTE — Telephone Encounter (Signed)
[name] ( [facility]) is calling in reference to [name] . She is wanting to speak with you about her medications for Her CHF to get a little more direction . Please call

## 2016-07-20 NOTE — Telephone Encounter (Signed)
I spoke with [name] [facility]) and she said the pt's lasix was just increased to 120mg  bid on Monday and as of today the pt's weight is back to baseline.  [name] felt like the pt's lasix dosage should be reduced back to 120mg  daily and keep metolazone at current dosage twice a week with the exception of taking a third dose per week based on weight gain.  She asked that I forward this plan back to Dr Excell Seltzer for review and get his thoughts.

## 2016-07-20 NOTE — Telephone Encounter (Signed)
Sherry with Hospice called about patient having 10 lbs weight gain and SOB about a week ago. Patient's PCP, Dr. Gerda Diss, increased patient's lasix from 120 mg daily to 120 mg BID. Patient is also taking metolazone 2.5 mg by mouth Tuesdays and Saturdays. Patient's weight has come down 8 lbs, but Hospice nurse is concerned about the high dose of Lasix and wondering if increasing metolazone an extra day a week would help or take an extra dose of metolazone when patient's weight is up. Will forward to Dr. Excell Seltzer and his nurse.

## 2016-07-20 NOTE — Telephone Encounter (Signed)
Would be fine to increase metolazone to 3 days/week

## 2016-07-20 NOTE — Telephone Encounter (Signed)
OK maybe best to leave on lasix 120 mg BID since her weight is down and she's clinically doing better.

## 2016-07-21 NOTE — Telephone Encounter (Signed)
I discussed the pt with Misty Stanley and she said that she weighed the pt last night and it was 175 lbs.  The pt spoke with Misty Stanley this morning and said her weight was 172 lbs.  At this time the pt will continue current regimen.  I will update the pt's medication list.  I advised Misty Stanley to notify us if pt's weight continues to drop below baseline, BP is low or pt feels lightheaded.

## 2016-07-22 ENCOUNTER — Encounter: Payer: Self-pay | Admitting: Family Medicine

## 2016-07-25 NOTE — Telephone Encounter (Signed)
Nurses-please read [name]'s message-it is fine to go ahead with what is requested- please go ahead with writing a letter in faxing it to [employer] regarding the request-also informed Kennyth Arnold that this is been done

## 2016-07-27 DIAGNOSIS — E785 Hyperlipidemia, unspecified: Secondary | ICD-10-CM | POA: Diagnosis not present

## 2016-07-27 DIAGNOSIS — I2510 Atherosclerotic heart disease of native coronary artery without angina pectoris: Secondary | ICD-10-CM | POA: Diagnosis not present

## 2016-07-27 DIAGNOSIS — I10 Essential (primary) hypertension: Secondary | ICD-10-CM | POA: Diagnosis not present

## 2016-07-27 DIAGNOSIS — J449 Chronic obstructive pulmonary disease, unspecified: Secondary | ICD-10-CM | POA: Diagnosis not present

## 2016-07-27 DIAGNOSIS — I509 Heart failure, unspecified: Secondary | ICD-10-CM | POA: Diagnosis not present

## 2016-07-27 DIAGNOSIS — N189 Chronic kidney disease, unspecified: Secondary | ICD-10-CM | POA: Diagnosis not present

## 2016-07-29 DIAGNOSIS — H348322 Tributary (branch) retinal vein occlusion, left eye, stable: Secondary | ICD-10-CM | POA: Diagnosis not present

## 2016-08-03 DIAGNOSIS — I2510 Atherosclerotic heart disease of native coronary artery without angina pectoris: Secondary | ICD-10-CM | POA: Diagnosis not present

## 2016-08-03 DIAGNOSIS — E785 Hyperlipidemia, unspecified: Secondary | ICD-10-CM | POA: Diagnosis not present

## 2016-08-03 DIAGNOSIS — N189 Chronic kidney disease, unspecified: Secondary | ICD-10-CM | POA: Diagnosis not present

## 2016-08-03 DIAGNOSIS — J449 Chronic obstructive pulmonary disease, unspecified: Secondary | ICD-10-CM | POA: Diagnosis not present

## 2016-08-03 DIAGNOSIS — I10 Essential (primary) hypertension: Secondary | ICD-10-CM | POA: Diagnosis not present

## 2016-08-03 DIAGNOSIS — I509 Heart failure, unspecified: Secondary | ICD-10-CM | POA: Diagnosis not present

## 2016-08-08 ENCOUNTER — Encounter: Payer: Self-pay | Admitting: Family Medicine

## 2016-08-09 ENCOUNTER — Other Ambulatory Visit: Payer: Self-pay | Admitting: *Deleted

## 2016-08-09 DIAGNOSIS — I2510 Atherosclerotic heart disease of native coronary artery without angina pectoris: Secondary | ICD-10-CM | POA: Diagnosis not present

## 2016-08-09 DIAGNOSIS — J449 Chronic obstructive pulmonary disease, unspecified: Secondary | ICD-10-CM | POA: Diagnosis not present

## 2016-08-09 DIAGNOSIS — N189 Chronic kidney disease, unspecified: Secondary | ICD-10-CM | POA: Diagnosis not present

## 2016-08-09 DIAGNOSIS — E785 Hyperlipidemia, unspecified: Secondary | ICD-10-CM | POA: Diagnosis not present

## 2016-08-09 DIAGNOSIS — Z0289 Encounter for other administrative examinations: Secondary | ICD-10-CM

## 2016-08-09 DIAGNOSIS — I509 Heart failure, unspecified: Secondary | ICD-10-CM | POA: Diagnosis not present

## 2016-08-09 DIAGNOSIS — I10 Essential (primary) hypertension: Secondary | ICD-10-CM | POA: Diagnosis not present

## 2016-08-09 MED ORDER — FIRST-DUKES MOUTHWASH MT SUSP: OROMUCOSAL | 4 refills | Status: DC

## 2016-08-09 MED ORDER — FIRST-DUKES MOUTHWASH MT SUSP: OROMUCOSAL | 0 refills | Status: DC

## 2016-08-09 NOTE — Telephone Encounter (Signed)
Nurses send in prescription for Magic mouthwash 1 teaspoon swish and spit 4 times a day for 5 days with 4 refills also tell caretaker Misty Stanley it is best for patient to rinse mouth out after usage of inhalers

## 2016-08-10 ENCOUNTER — Telehealth: Payer: Self-pay | Admitting: Family Medicine

## 2016-08-10 DIAGNOSIS — I10 Essential (primary) hypertension: Secondary | ICD-10-CM | POA: Diagnosis not present

## 2016-08-10 DIAGNOSIS — N189 Chronic kidney disease, unspecified: Secondary | ICD-10-CM | POA: Diagnosis not present

## 2016-08-10 DIAGNOSIS — J449 Chronic obstructive pulmonary disease, unspecified: Secondary | ICD-10-CM | POA: Diagnosis not present

## 2016-08-10 DIAGNOSIS — I509 Heart failure, unspecified: Secondary | ICD-10-CM | POA: Diagnosis not present

## 2016-08-10 DIAGNOSIS — I2510 Atherosclerotic heart disease of native coronary artery without angina pectoris: Secondary | ICD-10-CM | POA: Diagnosis not present

## 2016-08-10 DIAGNOSIS — E785 Hyperlipidemia, unspecified: Secondary | ICD-10-CM | POA: Diagnosis not present

## 2016-08-10 NOTE — Telephone Encounter (Signed)
Granddaughter dropped off FMLA papers for [facility] daughter [name] to be filled out for Hospice care that comes in the home twice to three times a week and her daughter helps out also when hospice cant come in it varieties with her. I wasn't sure how this works with hospice coming into the home helping out Ifilled in what I could..Form in yellow folder.

## 2016-08-11 ENCOUNTER — Other Ambulatory Visit: Payer: Self-pay | Admitting: Family Medicine

## 2016-08-11 ENCOUNTER — Encounter (HOSPITAL_COMMUNITY)

## 2016-08-11 DIAGNOSIS — E785 Hyperlipidemia, unspecified: Secondary | ICD-10-CM | POA: Diagnosis not present

## 2016-08-11 DIAGNOSIS — I509 Heart failure, unspecified: Secondary | ICD-10-CM | POA: Diagnosis not present

## 2016-08-11 DIAGNOSIS — I2510 Atherosclerotic heart disease of native coronary artery without angina pectoris: Secondary | ICD-10-CM | POA: Diagnosis not present

## 2016-08-11 DIAGNOSIS — I10 Essential (primary) hypertension: Secondary | ICD-10-CM | POA: Diagnosis not present

## 2016-08-11 DIAGNOSIS — J449 Chronic obstructive pulmonary disease, unspecified: Secondary | ICD-10-CM | POA: Diagnosis not present

## 2016-08-11 DIAGNOSIS — N189 Chronic kidney disease, unspecified: Secondary | ICD-10-CM | POA: Diagnosis not present

## 2016-08-12 NOTE — Telephone Encounter (Signed)
This form was filled out 

## 2016-08-14 DIAGNOSIS — I10 Essential (primary) hypertension: Secondary | ICD-10-CM | POA: Diagnosis not present

## 2016-08-14 DIAGNOSIS — I2510 Atherosclerotic heart disease of native coronary artery without angina pectoris: Secondary | ICD-10-CM | POA: Diagnosis not present

## 2016-08-14 DIAGNOSIS — I509 Heart failure, unspecified: Secondary | ICD-10-CM | POA: Diagnosis not present

## 2016-08-14 DIAGNOSIS — J449 Chronic obstructive pulmonary disease, unspecified: Secondary | ICD-10-CM | POA: Diagnosis not present

## 2016-08-14 DIAGNOSIS — K219 Gastro-esophageal reflux disease without esophagitis: Secondary | ICD-10-CM | POA: Diagnosis not present

## 2016-08-14 DIAGNOSIS — E785 Hyperlipidemia, unspecified: Secondary | ICD-10-CM | POA: Diagnosis not present

## 2016-08-14 DIAGNOSIS — J301 Allergic rhinitis due to pollen: Secondary | ICD-10-CM | POA: Diagnosis not present

## 2016-08-14 DIAGNOSIS — N189 Chronic kidney disease, unspecified: Secondary | ICD-10-CM | POA: Diagnosis not present

## 2016-08-14 DIAGNOSIS — G309 Alzheimer's disease, unspecified: Secondary | ICD-10-CM | POA: Diagnosis not present

## 2016-08-14 DIAGNOSIS — F339 Major depressive disorder, recurrent, unspecified: Secondary | ICD-10-CM | POA: Diagnosis not present

## 2016-08-15 DIAGNOSIS — J449 Chronic obstructive pulmonary disease, unspecified: Secondary | ICD-10-CM | POA: Diagnosis not present

## 2016-08-15 DIAGNOSIS — I2510 Atherosclerotic heart disease of native coronary artery without angina pectoris: Secondary | ICD-10-CM | POA: Diagnosis not present

## 2016-08-15 DIAGNOSIS — I509 Heart failure, unspecified: Secondary | ICD-10-CM | POA: Diagnosis not present

## 2016-08-15 DIAGNOSIS — N189 Chronic kidney disease, unspecified: Secondary | ICD-10-CM | POA: Diagnosis not present

## 2016-08-15 DIAGNOSIS — I10 Essential (primary) hypertension: Secondary | ICD-10-CM | POA: Diagnosis not present

## 2016-08-15 DIAGNOSIS — E785 Hyperlipidemia, unspecified: Secondary | ICD-10-CM | POA: Diagnosis not present

## 2016-08-18 DIAGNOSIS — E785 Hyperlipidemia, unspecified: Secondary | ICD-10-CM | POA: Diagnosis not present

## 2016-08-18 DIAGNOSIS — I10 Essential (primary) hypertension: Secondary | ICD-10-CM | POA: Diagnosis not present

## 2016-08-18 DIAGNOSIS — J449 Chronic obstructive pulmonary disease, unspecified: Secondary | ICD-10-CM | POA: Diagnosis not present

## 2016-08-18 DIAGNOSIS — I2510 Atherosclerotic heart disease of native coronary artery without angina pectoris: Secondary | ICD-10-CM | POA: Diagnosis not present

## 2016-08-18 DIAGNOSIS — I509 Heart failure, unspecified: Secondary | ICD-10-CM | POA: Diagnosis not present

## 2016-08-18 DIAGNOSIS — N189 Chronic kidney disease, unspecified: Secondary | ICD-10-CM | POA: Diagnosis not present

## 2016-08-20 DIAGNOSIS — J449 Chronic obstructive pulmonary disease, unspecified: Secondary | ICD-10-CM | POA: Diagnosis not present

## 2016-08-20 DIAGNOSIS — N189 Chronic kidney disease, unspecified: Secondary | ICD-10-CM | POA: Diagnosis not present

## 2016-08-20 DIAGNOSIS — E785 Hyperlipidemia, unspecified: Secondary | ICD-10-CM | POA: Diagnosis not present

## 2016-08-20 DIAGNOSIS — I2510 Atherosclerotic heart disease of native coronary artery without angina pectoris: Secondary | ICD-10-CM | POA: Diagnosis not present

## 2016-08-20 DIAGNOSIS — I10 Essential (primary) hypertension: Secondary | ICD-10-CM | POA: Diagnosis not present

## 2016-08-20 DIAGNOSIS — I509 Heart failure, unspecified: Secondary | ICD-10-CM | POA: Diagnosis not present

## 2016-08-23 ENCOUNTER — Ambulatory Visit (INDEPENDENT_AMBULATORY_CARE_PROVIDER_SITE_OTHER): Payer: Medicare Other | Admitting: Family Medicine

## 2016-08-23 ENCOUNTER — Encounter: Payer: Self-pay | Admitting: Family Medicine

## 2016-08-23 VITALS — BP 118/68 | Ht 63.0 in | Wt 177.4 lb

## 2016-08-23 DIAGNOSIS — J449 Chronic obstructive pulmonary disease, unspecified: Secondary | ICD-10-CM

## 2016-08-23 DIAGNOSIS — D692 Other nonthrombocytopenic purpura: Secondary | ICD-10-CM | POA: Diagnosis not present

## 2016-08-23 DIAGNOSIS — I509 Heart failure, unspecified: Secondary | ICD-10-CM | POA: Diagnosis not present

## 2016-08-23 DIAGNOSIS — I5032 Chronic diastolic (congestive) heart failure: Secondary | ICD-10-CM

## 2016-08-23 DIAGNOSIS — F419 Anxiety disorder, unspecified: Secondary | ICD-10-CM | POA: Diagnosis not present

## 2016-08-23 DIAGNOSIS — N189 Chronic kidney disease, unspecified: Secondary | ICD-10-CM | POA: Diagnosis not present

## 2016-08-23 DIAGNOSIS — I10 Essential (primary) hypertension: Secondary | ICD-10-CM | POA: Diagnosis not present

## 2016-08-23 DIAGNOSIS — I2510 Atherosclerotic heart disease of native coronary artery without angina pectoris: Secondary | ICD-10-CM | POA: Diagnosis not present

## 2016-08-23 DIAGNOSIS — E785 Hyperlipidemia, unspecified: Secondary | ICD-10-CM | POA: Diagnosis not present

## 2016-08-23 MED ORDER — BUSPIRONE HCL 15 MG PO TABS: 15.0000 mg | ORAL_TABLET | Freq: Three times a day (TID) | ORAL | 5 refills | Status: DC

## 2016-08-23 NOTE — Progress Notes (Signed)
   Subjective:    Patient ID: [name], female    DOB: 07/09/1935, 81 y.o.   MRN: [id number]  HPI Patient arrives for a follow up on COPD.  Patient has CHF she has COPD Both are considered in stage Hospice sees her on a weekly basis Family takes care of her Has not had any ER or hospital admissions They prefer not to do any testing Patient trying to eat uses oxygen to help alleviate hypoxia Review of Systems  Constitutional: Negative for activity change, fatigue and fever.  Respiratory: Positive for shortness of breath and wheezing. Negative for cough.   Cardiovascular: Negative for chest pain and leg swelling.  Neurological: Negative for headaches.       Objective:   Physical Exam  Constitutional: She appears well-nourished. No distress.  Cardiovascular: Normal rate, regular rhythm and normal heart sounds.   No murmur heard. Pulmonary/Chest: Effort normal and breath sounds normal. No respiratory distress.  Musculoskeletal: She exhibits no edema.  Lymphadenopathy:    She has no cervical adenopathy.  Neurological: She is alert. She exhibits normal muscle tone.  Psychiatric: Her behavior is normal.  Vitals reviewed.  Marland KitchenSAL2 Senile purpura noted on the arms Increase anxiety related issues adjust BuSpar 15 mg 3 times daily     Assessment & Plan:  COPD stable on oxygen in stage CHF stable in stage Senile purpura stable Labs and not indicated Follow-up 3 months

## 2016-08-24 DIAGNOSIS — E785 Hyperlipidemia, unspecified: Secondary | ICD-10-CM | POA: Diagnosis not present

## 2016-08-24 DIAGNOSIS — N189 Chronic kidney disease, unspecified: Secondary | ICD-10-CM | POA: Diagnosis not present

## 2016-08-24 DIAGNOSIS — I509 Heart failure, unspecified: Secondary | ICD-10-CM | POA: Diagnosis not present

## 2016-08-24 DIAGNOSIS — I2510 Atherosclerotic heart disease of native coronary artery without angina pectoris: Secondary | ICD-10-CM | POA: Diagnosis not present

## 2016-08-24 DIAGNOSIS — I10 Essential (primary) hypertension: Secondary | ICD-10-CM | POA: Diagnosis not present

## 2016-08-24 DIAGNOSIS — J449 Chronic obstructive pulmonary disease, unspecified: Secondary | ICD-10-CM | POA: Diagnosis not present

## 2016-08-25 DIAGNOSIS — E785 Hyperlipidemia, unspecified: Secondary | ICD-10-CM | POA: Diagnosis not present

## 2016-08-25 DIAGNOSIS — I10 Essential (primary) hypertension: Secondary | ICD-10-CM | POA: Diagnosis not present

## 2016-08-25 DIAGNOSIS — J449 Chronic obstructive pulmonary disease, unspecified: Secondary | ICD-10-CM | POA: Diagnosis not present

## 2016-08-25 DIAGNOSIS — I509 Heart failure, unspecified: Secondary | ICD-10-CM | POA: Diagnosis not present

## 2016-08-25 DIAGNOSIS — I2510 Atherosclerotic heart disease of native coronary artery without angina pectoris: Secondary | ICD-10-CM | POA: Diagnosis not present

## 2016-08-25 DIAGNOSIS — N189 Chronic kidney disease, unspecified: Secondary | ICD-10-CM | POA: Diagnosis not present

## 2016-08-29 ENCOUNTER — Encounter: Payer: Self-pay | Admitting: Family Medicine

## 2016-08-29 ENCOUNTER — Ambulatory Visit (INDEPENDENT_AMBULATORY_CARE_PROVIDER_SITE_OTHER): Payer: Medicare Other | Admitting: Family Medicine

## 2016-08-29 VITALS — BP 132/70 | Ht 63.0 in | Wt 178.0 lb

## 2016-08-29 DIAGNOSIS — S20211A Contusion of right front wall of thorax, initial encounter: Secondary | ICD-10-CM

## 2016-08-29 DIAGNOSIS — W06XXXA Fall from bed, initial encounter: Secondary | ICD-10-CM

## 2016-08-29 NOTE — Patient Instructions (Signed)
Use tylenol and Ice  I doubt a fracture, should be getting better by next week  If not doing better by next week please call then we will do xrays

## 2016-08-29 NOTE — Progress Notes (Signed)
   Subjective:    Patient ID: [name], female    DOB: 1935-02-24, 81 y.o.   MRN: [id number]  Fall  Incident onset: july 13th. Fall occurred: fell getting in the bed. Point of impact: right ribs, right arm, back. Treatments tried: bio freeze, tylenol.   Patient with a fall out of bed cause some soreness in the right ribs she denies any new difficulty in regards to breathing she states the pain and discomfort is tolerable. Taking Tylenol.   Review of Systems    patient denies hemoptysis denies fever chills sweats denies numbness weakness no loss of consciousness Objective:   Physical Exam Lungs are clear scattered wheezes not rest or distress subjective tenderness in the right rib cage no point tenderness no bruising or swelling noted       Assessment & Plan:  Mild contusions X-rays not indicated   Progress of troubles or worse notify us. May need additional testing depending on what we see  To follow-up if ongoing troubles

## 2016-08-31 ENCOUNTER — Telehealth: Payer: Self-pay | Admitting: Family Medicine

## 2016-08-31 DIAGNOSIS — E785 Hyperlipidemia, unspecified: Secondary | ICD-10-CM | POA: Diagnosis not present

## 2016-08-31 DIAGNOSIS — I10 Essential (primary) hypertension: Secondary | ICD-10-CM | POA: Diagnosis not present

## 2016-08-31 DIAGNOSIS — I2510 Atherosclerotic heart disease of native coronary artery without angina pectoris: Secondary | ICD-10-CM | POA: Diagnosis not present

## 2016-08-31 DIAGNOSIS — J449 Chronic obstructive pulmonary disease, unspecified: Secondary | ICD-10-CM | POA: Diagnosis not present

## 2016-08-31 DIAGNOSIS — N189 Chronic kidney disease, unspecified: Secondary | ICD-10-CM | POA: Diagnosis not present

## 2016-08-31 DIAGNOSIS — I509 Heart failure, unspecified: Secondary | ICD-10-CM | POA: Diagnosis not present

## 2016-08-31 MED ORDER — MORPHINE SULFATE (CONCENTRATE) 20 MG/ML PO SOLN: ORAL | 0 refills | Status: DC

## 2016-08-31 NOTE — Telephone Encounter (Signed)
[facility] called needing prescription for mor fin  Liquid called into [place]. Sherry-[phone]

## 2016-08-31 NOTE — Telephone Encounter (Signed)
Prescription refilled and faxed to [facility]. Notified Hospice Nurse(Sherry)

## 2016-09-05 ENCOUNTER — Encounter: Payer: Self-pay | Admitting: Family Medicine

## 2016-09-05 NOTE — Telephone Encounter (Signed)
Nurse's-please send in prescription for omeprazole 20 mg #30, 5 refills she may well BMI get this via hospice since she is a hospice patient. Please let Kennyth Arnold know as for the pain Tylenol is a reasonable option but if that is not doing enough we can try Vicodin if they are interested.

## 2016-09-06 MED ORDER — OMEPRAZOLE 20 MG PO CPDR: 20.0000 mg | DELAYED_RELEASE_CAPSULE | Freq: Every day | ORAL | 5 refills | Status: AC

## 2016-09-06 NOTE — Addendum Note (Signed)
Addended by: Jeralene Peters on: 09/06/2016 08:10 AM   Modules accepted: Orders

## 2016-09-07 DIAGNOSIS — I10 Essential (primary) hypertension: Secondary | ICD-10-CM | POA: Diagnosis not present

## 2016-09-07 DIAGNOSIS — E785 Hyperlipidemia, unspecified: Secondary | ICD-10-CM | POA: Diagnosis not present

## 2016-09-07 DIAGNOSIS — I2510 Atherosclerotic heart disease of native coronary artery without angina pectoris: Secondary | ICD-10-CM | POA: Diagnosis not present

## 2016-09-07 DIAGNOSIS — N189 Chronic kidney disease, unspecified: Secondary | ICD-10-CM | POA: Diagnosis not present

## 2016-09-07 DIAGNOSIS — J449 Chronic obstructive pulmonary disease, unspecified: Secondary | ICD-10-CM | POA: Diagnosis not present

## 2016-09-07 DIAGNOSIS — I509 Heart failure, unspecified: Secondary | ICD-10-CM | POA: Diagnosis not present

## 2016-09-08 DIAGNOSIS — I10 Essential (primary) hypertension: Secondary | ICD-10-CM | POA: Diagnosis not present

## 2016-09-08 DIAGNOSIS — E785 Hyperlipidemia, unspecified: Secondary | ICD-10-CM | POA: Diagnosis not present

## 2016-09-08 DIAGNOSIS — I509 Heart failure, unspecified: Secondary | ICD-10-CM | POA: Diagnosis not present

## 2016-09-08 DIAGNOSIS — J449 Chronic obstructive pulmonary disease, unspecified: Secondary | ICD-10-CM | POA: Diagnosis not present

## 2016-09-08 DIAGNOSIS — N189 Chronic kidney disease, unspecified: Secondary | ICD-10-CM | POA: Diagnosis not present

## 2016-09-08 DIAGNOSIS — I2510 Atherosclerotic heart disease of native coronary artery without angina pectoris: Secondary | ICD-10-CM | POA: Diagnosis not present

## 2016-09-09 DIAGNOSIS — H34832 Tributary (branch) retinal vein occlusion, left eye, with macular edema: Secondary | ICD-10-CM | POA: Diagnosis not present

## 2016-09-12 DIAGNOSIS — I2510 Atherosclerotic heart disease of native coronary artery without angina pectoris: Secondary | ICD-10-CM | POA: Diagnosis not present

## 2016-09-12 DIAGNOSIS — N189 Chronic kidney disease, unspecified: Secondary | ICD-10-CM | POA: Diagnosis not present

## 2016-09-12 DIAGNOSIS — J449 Chronic obstructive pulmonary disease, unspecified: Secondary | ICD-10-CM | POA: Diagnosis not present

## 2016-09-12 DIAGNOSIS — I10 Essential (primary) hypertension: Secondary | ICD-10-CM | POA: Diagnosis not present

## 2016-09-12 DIAGNOSIS — E785 Hyperlipidemia, unspecified: Secondary | ICD-10-CM | POA: Diagnosis not present

## 2016-09-12 DIAGNOSIS — I509 Heart failure, unspecified: Secondary | ICD-10-CM | POA: Diagnosis not present

## 2016-09-14 DIAGNOSIS — N189 Chronic kidney disease, unspecified: Secondary | ICD-10-CM | POA: Diagnosis not present

## 2016-09-14 DIAGNOSIS — F339 Major depressive disorder, recurrent, unspecified: Secondary | ICD-10-CM | POA: Diagnosis not present

## 2016-09-14 DIAGNOSIS — I509 Heart failure, unspecified: Secondary | ICD-10-CM | POA: Diagnosis not present

## 2016-09-14 DIAGNOSIS — G309 Alzheimer's disease, unspecified: Secondary | ICD-10-CM | POA: Diagnosis not present

## 2016-09-14 DIAGNOSIS — I10 Essential (primary) hypertension: Secondary | ICD-10-CM | POA: Diagnosis not present

## 2016-09-14 DIAGNOSIS — E785 Hyperlipidemia, unspecified: Secondary | ICD-10-CM | POA: Diagnosis not present

## 2016-09-14 DIAGNOSIS — J449 Chronic obstructive pulmonary disease, unspecified: Secondary | ICD-10-CM | POA: Diagnosis not present

## 2016-09-14 DIAGNOSIS — I2510 Atherosclerotic heart disease of native coronary artery without angina pectoris: Secondary | ICD-10-CM | POA: Diagnosis not present

## 2016-09-16 ENCOUNTER — Other Ambulatory Visit: Payer: Self-pay | Admitting: Nurse Practitioner

## 2016-09-16 DIAGNOSIS — J449 Chronic obstructive pulmonary disease, unspecified: Secondary | ICD-10-CM

## 2016-09-16 DIAGNOSIS — I10 Essential (primary) hypertension: Secondary | ICD-10-CM | POA: Diagnosis not present

## 2016-09-16 DIAGNOSIS — I2510 Atherosclerotic heart disease of native coronary artery without angina pectoris: Secondary | ICD-10-CM | POA: Diagnosis not present

## 2016-09-16 DIAGNOSIS — I5033 Acute on chronic diastolic (congestive) heart failure: Secondary | ICD-10-CM

## 2016-09-16 DIAGNOSIS — E785 Hyperlipidemia, unspecified: Secondary | ICD-10-CM | POA: Diagnosis not present

## 2016-09-16 DIAGNOSIS — N189 Chronic kidney disease, unspecified: Secondary | ICD-10-CM | POA: Diagnosis not present

## 2016-09-16 DIAGNOSIS — I509 Heart failure, unspecified: Secondary | ICD-10-CM | POA: Diagnosis not present

## 2016-09-19 ENCOUNTER — Encounter: Payer: Self-pay | Admitting: Family Medicine

## 2016-09-19 ENCOUNTER — Ambulatory Visit (INDEPENDENT_AMBULATORY_CARE_PROVIDER_SITE_OTHER): Payer: Medicare Other | Admitting: Family Medicine

## 2016-09-19 ENCOUNTER — Ambulatory Visit (HOSPITAL_COMMUNITY)
Admission: RE | Admit: 2016-09-19 | Discharge: 2016-09-19 | Disposition: A | Discharge status: Home / Self Care | Source: Ambulatory Visit | Attending: Family Medicine | Admitting: Family Medicine

## 2016-09-19 VITALS — BP 118/62 | Ht 63.0 in | Wt 178.4 lb

## 2016-09-19 DIAGNOSIS — R05 Cough: Secondary | ICD-10-CM | POA: Diagnosis not present

## 2016-09-19 DIAGNOSIS — E785 Hyperlipidemia, unspecified: Secondary | ICD-10-CM | POA: Diagnosis not present

## 2016-09-19 DIAGNOSIS — J441 Chronic obstructive pulmonary disease with (acute) exacerbation: Secondary | ICD-10-CM | POA: Insufficient documentation

## 2016-09-19 DIAGNOSIS — S20211D Contusion of right front wall of thorax, subsequent encounter: Secondary | ICD-10-CM

## 2016-09-19 DIAGNOSIS — S6991XA Unspecified injury of right wrist, hand and finger(s), initial encounter: Secondary | ICD-10-CM | POA: Diagnosis not present

## 2016-09-19 DIAGNOSIS — S40021S Contusion of right upper arm, sequela: Secondary | ICD-10-CM

## 2016-09-19 DIAGNOSIS — X58XXXA Exposure to other specified factors, initial encounter: Secondary | ICD-10-CM | POA: Diagnosis not present

## 2016-09-19 DIAGNOSIS — N189 Chronic kidney disease, unspecified: Secondary | ICD-10-CM | POA: Diagnosis not present

## 2016-09-19 DIAGNOSIS — M79601 Pain in right arm: Secondary | ICD-10-CM | POA: Insufficient documentation

## 2016-09-19 DIAGNOSIS — Z8781 Personal history of (healed) traumatic fracture: Secondary | ICD-10-CM | POA: Diagnosis not present

## 2016-09-19 DIAGNOSIS — I509 Heart failure, unspecified: Secondary | ICD-10-CM | POA: Diagnosis not present

## 2016-09-19 DIAGNOSIS — I10 Essential (primary) hypertension: Secondary | ICD-10-CM | POA: Diagnosis not present

## 2016-09-19 DIAGNOSIS — I517 Cardiomegaly: Secondary | ICD-10-CM | POA: Insufficient documentation

## 2016-09-19 DIAGNOSIS — I700 Atherosclerosis of aorta: Secondary | ICD-10-CM | POA: Insufficient documentation

## 2016-09-19 DIAGNOSIS — S40021D Contusion of right upper arm, subsequent encounter: Secondary | ICD-10-CM

## 2016-09-19 DIAGNOSIS — J449 Chronic obstructive pulmonary disease, unspecified: Secondary | ICD-10-CM | POA: Diagnosis not present

## 2016-09-19 DIAGNOSIS — M79621 Pain in right upper arm: Secondary | ICD-10-CM | POA: Diagnosis not present

## 2016-09-19 DIAGNOSIS — W06XXXD Fall from bed, subsequent encounter: Secondary | ICD-10-CM

## 2016-09-19 DIAGNOSIS — R062 Wheezing: Secondary | ICD-10-CM | POA: Diagnosis not present

## 2016-09-19 DIAGNOSIS — I2510 Atherosclerotic heart disease of native coronary artery without angina pectoris: Secondary | ICD-10-CM | POA: Diagnosis not present

## 2016-09-19 MED ORDER — PREDNISONE 20 MG PO TABS: ORAL_TABLET | ORAL | 0 refills | Status: DC

## 2016-09-19 MED ORDER — CEFPROZIL 500 MG PO TABS: 500.0000 mg | ORAL_TABLET | Freq: Two times a day (BID) | ORAL | 0 refills | Status: DC

## 2016-09-19 NOTE — Progress Notes (Signed)
   Subjective:    Patient ID: [name], female    DOB: 09/16/1935, 81 y.o.   MRN: [id number]  HPI Patient arrives with c/o back and shoulder pain s/p fall. This patient had fall hit her shoulder hit her ribs she is at risk for fall because of weakness along with poor health we talked about the importance of using a cane or a walker she has ongoing pain and discomfort in the chest and the right shoulder and humerus region despite supportive care  Also history of in stage hospice CHF but she's been having some cough wheezing low bit of shortness of breath past several days along with yellow-green drainage. Denies high fever chills sweats  Review of Systems See above. No vomiting diarrhea. No swelling in the legs    Objective:   Physical Exam Lungs expiratory wheezes respiratory rate fair heart regular her chest wall upper mid back tender right humerus proximal tender  25 minutes was spent with the patient. Greater than half the time was spent in discussion and answering questions and counseling regarding the issues that the patient came in for today.      Assessment & Plan:  COPD exacerbation Antibiotic prescribed Prednisone taper Albuterol when necessary X-rays of the chest in the humerus to rule out fracture Awake the results

## 2016-09-21 DIAGNOSIS — I2510 Atherosclerotic heart disease of native coronary artery without angina pectoris: Secondary | ICD-10-CM | POA: Diagnosis not present

## 2016-09-21 DIAGNOSIS — E785 Hyperlipidemia, unspecified: Secondary | ICD-10-CM | POA: Diagnosis not present

## 2016-09-21 DIAGNOSIS — J449 Chronic obstructive pulmonary disease, unspecified: Secondary | ICD-10-CM | POA: Diagnosis not present

## 2016-09-21 DIAGNOSIS — I10 Essential (primary) hypertension: Secondary | ICD-10-CM | POA: Diagnosis not present

## 2016-09-21 DIAGNOSIS — N189 Chronic kidney disease, unspecified: Secondary | ICD-10-CM | POA: Diagnosis not present

## 2016-09-21 DIAGNOSIS — I509 Heart failure, unspecified: Secondary | ICD-10-CM | POA: Diagnosis not present

## 2016-09-23 DIAGNOSIS — I509 Heart failure, unspecified: Secondary | ICD-10-CM | POA: Diagnosis not present

## 2016-09-23 DIAGNOSIS — I10 Essential (primary) hypertension: Secondary | ICD-10-CM | POA: Diagnosis not present

## 2016-09-23 DIAGNOSIS — J449 Chronic obstructive pulmonary disease, unspecified: Secondary | ICD-10-CM | POA: Diagnosis not present

## 2016-09-23 DIAGNOSIS — E785 Hyperlipidemia, unspecified: Secondary | ICD-10-CM | POA: Diagnosis not present

## 2016-09-23 DIAGNOSIS — I2510 Atherosclerotic heart disease of native coronary artery without angina pectoris: Secondary | ICD-10-CM | POA: Diagnosis not present

## 2016-09-23 DIAGNOSIS — N189 Chronic kidney disease, unspecified: Secondary | ICD-10-CM | POA: Diagnosis not present

## 2016-09-28 DIAGNOSIS — I509 Heart failure, unspecified: Secondary | ICD-10-CM | POA: Diagnosis not present

## 2016-09-28 DIAGNOSIS — N189 Chronic kidney disease, unspecified: Secondary | ICD-10-CM | POA: Diagnosis not present

## 2016-09-28 DIAGNOSIS — E785 Hyperlipidemia, unspecified: Secondary | ICD-10-CM | POA: Diagnosis not present

## 2016-09-28 DIAGNOSIS — J449 Chronic obstructive pulmonary disease, unspecified: Secondary | ICD-10-CM | POA: Diagnosis not present

## 2016-09-28 DIAGNOSIS — I10 Essential (primary) hypertension: Secondary | ICD-10-CM | POA: Diagnosis not present

## 2016-09-28 DIAGNOSIS — I2510 Atherosclerotic heart disease of native coronary artery without angina pectoris: Secondary | ICD-10-CM | POA: Diagnosis not present

## 2016-10-03 DIAGNOSIS — N189 Chronic kidney disease, unspecified: Secondary | ICD-10-CM | POA: Diagnosis not present

## 2016-10-03 DIAGNOSIS — I10 Essential (primary) hypertension: Secondary | ICD-10-CM | POA: Diagnosis not present

## 2016-10-03 DIAGNOSIS — E785 Hyperlipidemia, unspecified: Secondary | ICD-10-CM | POA: Diagnosis not present

## 2016-10-03 DIAGNOSIS — J449 Chronic obstructive pulmonary disease, unspecified: Secondary | ICD-10-CM | POA: Diagnosis not present

## 2016-10-03 DIAGNOSIS — I2510 Atherosclerotic heart disease of native coronary artery without angina pectoris: Secondary | ICD-10-CM | POA: Diagnosis not present

## 2016-10-03 DIAGNOSIS — I509 Heart failure, unspecified: Secondary | ICD-10-CM | POA: Diagnosis not present

## 2016-10-05 DIAGNOSIS — I2510 Atherosclerotic heart disease of native coronary artery without angina pectoris: Secondary | ICD-10-CM | POA: Diagnosis not present

## 2016-10-05 DIAGNOSIS — I10 Essential (primary) hypertension: Secondary | ICD-10-CM | POA: Diagnosis not present

## 2016-10-05 DIAGNOSIS — J449 Chronic obstructive pulmonary disease, unspecified: Secondary | ICD-10-CM | POA: Diagnosis not present

## 2016-10-05 DIAGNOSIS — E785 Hyperlipidemia, unspecified: Secondary | ICD-10-CM | POA: Diagnosis not present

## 2016-10-05 DIAGNOSIS — N189 Chronic kidney disease, unspecified: Secondary | ICD-10-CM | POA: Diagnosis not present

## 2016-10-05 DIAGNOSIS — I509 Heart failure, unspecified: Secondary | ICD-10-CM | POA: Diagnosis not present

## 2016-10-06 ENCOUNTER — Ambulatory Visit: Payer: PRIVATE HEALTH INSURANCE | Admitting: Family Medicine

## 2016-10-10 DIAGNOSIS — E785 Hyperlipidemia, unspecified: Secondary | ICD-10-CM | POA: Diagnosis not present

## 2016-10-10 DIAGNOSIS — I2510 Atherosclerotic heart disease of native coronary artery without angina pectoris: Secondary | ICD-10-CM | POA: Diagnosis not present

## 2016-10-10 DIAGNOSIS — N189 Chronic kidney disease, unspecified: Secondary | ICD-10-CM | POA: Diagnosis not present

## 2016-10-10 DIAGNOSIS — I509 Heart failure, unspecified: Secondary | ICD-10-CM | POA: Diagnosis not present

## 2016-10-10 DIAGNOSIS — I10 Essential (primary) hypertension: Secondary | ICD-10-CM | POA: Diagnosis not present

## 2016-10-10 DIAGNOSIS — J449 Chronic obstructive pulmonary disease, unspecified: Secondary | ICD-10-CM | POA: Diagnosis not present

## 2016-10-12 DIAGNOSIS — E785 Hyperlipidemia, unspecified: Secondary | ICD-10-CM | POA: Diagnosis not present

## 2016-10-12 DIAGNOSIS — I509 Heart failure, unspecified: Secondary | ICD-10-CM | POA: Diagnosis not present

## 2016-10-12 DIAGNOSIS — I2510 Atherosclerotic heart disease of native coronary artery without angina pectoris: Secondary | ICD-10-CM | POA: Diagnosis not present

## 2016-10-12 DIAGNOSIS — N189 Chronic kidney disease, unspecified: Secondary | ICD-10-CM | POA: Diagnosis not present

## 2016-10-12 DIAGNOSIS — I10 Essential (primary) hypertension: Secondary | ICD-10-CM | POA: Diagnosis not present

## 2016-10-12 DIAGNOSIS — J449 Chronic obstructive pulmonary disease, unspecified: Secondary | ICD-10-CM | POA: Diagnosis not present

## 2016-10-13 DIAGNOSIS — H34832 Tributary (branch) retinal vein occlusion, left eye, with macular edema: Secondary | ICD-10-CM | POA: Diagnosis not present

## 2016-10-15 DIAGNOSIS — N189 Chronic kidney disease, unspecified: Secondary | ICD-10-CM | POA: Diagnosis not present

## 2016-10-15 DIAGNOSIS — J449 Chronic obstructive pulmonary disease, unspecified: Secondary | ICD-10-CM | POA: Diagnosis not present

## 2016-10-15 DIAGNOSIS — E785 Hyperlipidemia, unspecified: Secondary | ICD-10-CM | POA: Diagnosis not present

## 2016-10-15 DIAGNOSIS — G309 Alzheimer's disease, unspecified: Secondary | ICD-10-CM | POA: Diagnosis not present

## 2016-10-15 DIAGNOSIS — I509 Heart failure, unspecified: Secondary | ICD-10-CM | POA: Diagnosis not present

## 2016-10-15 DIAGNOSIS — I2510 Atherosclerotic heart disease of native coronary artery without angina pectoris: Secondary | ICD-10-CM | POA: Diagnosis not present

## 2016-10-15 DIAGNOSIS — F339 Major depressive disorder, recurrent, unspecified: Secondary | ICD-10-CM | POA: Diagnosis not present

## 2016-10-15 DIAGNOSIS — I10 Essential (primary) hypertension: Secondary | ICD-10-CM | POA: Diagnosis not present

## 2016-10-19 ENCOUNTER — Telehealth: Payer: Self-pay | Admitting: Family Medicine

## 2016-10-19 DIAGNOSIS — E785 Hyperlipidemia, unspecified: Secondary | ICD-10-CM | POA: Diagnosis not present

## 2016-10-19 DIAGNOSIS — I10 Essential (primary) hypertension: Secondary | ICD-10-CM | POA: Diagnosis not present

## 2016-10-19 DIAGNOSIS — N189 Chronic kidney disease, unspecified: Secondary | ICD-10-CM | POA: Diagnosis not present

## 2016-10-19 DIAGNOSIS — I2510 Atherosclerotic heart disease of native coronary artery without angina pectoris: Secondary | ICD-10-CM | POA: Diagnosis not present

## 2016-10-19 DIAGNOSIS — J449 Chronic obstructive pulmonary disease, unspecified: Secondary | ICD-10-CM | POA: Diagnosis not present

## 2016-10-19 DIAGNOSIS — I509 Heart failure, unspecified: Secondary | ICD-10-CM | POA: Diagnosis not present

## 2016-10-19 NOTE — Telephone Encounter (Signed)
Sure 25 mg tid prn, numb 30

## 2016-10-19 NOTE — Telephone Encounter (Signed)
[name] from Hospice says that [name] is experiencing vertigo and dizziness.  She wants to know if it is okay for her to send in Meclizine?

## 2016-10-19 NOTE — Telephone Encounter (Signed)
Spoke with [name] at hospice who stated they will call in prescription per their protocol  so hospice will pay for it.

## 2016-10-24 ENCOUNTER — Other Ambulatory Visit: Payer: Self-pay | Admitting: Family Medicine

## 2016-10-24 DIAGNOSIS — I509 Heart failure, unspecified: Secondary | ICD-10-CM | POA: Diagnosis not present

## 2016-10-24 DIAGNOSIS — I10 Essential (primary) hypertension: Secondary | ICD-10-CM | POA: Diagnosis not present

## 2016-10-24 DIAGNOSIS — J449 Chronic obstructive pulmonary disease, unspecified: Secondary | ICD-10-CM | POA: Diagnosis not present

## 2016-10-24 DIAGNOSIS — I2510 Atherosclerotic heart disease of native coronary artery without angina pectoris: Secondary | ICD-10-CM | POA: Diagnosis not present

## 2016-10-24 DIAGNOSIS — E785 Hyperlipidemia, unspecified: Secondary | ICD-10-CM | POA: Diagnosis not present

## 2016-10-24 DIAGNOSIS — N189 Chronic kidney disease, unspecified: Secondary | ICD-10-CM | POA: Diagnosis not present

## 2016-11-02 DIAGNOSIS — N189 Chronic kidney disease, unspecified: Secondary | ICD-10-CM | POA: Diagnosis not present

## 2016-11-02 DIAGNOSIS — I509 Heart failure, unspecified: Secondary | ICD-10-CM | POA: Diagnosis not present

## 2016-11-02 DIAGNOSIS — I10 Essential (primary) hypertension: Secondary | ICD-10-CM | POA: Diagnosis not present

## 2016-11-02 DIAGNOSIS — E785 Hyperlipidemia, unspecified: Secondary | ICD-10-CM | POA: Diagnosis not present

## 2016-11-02 DIAGNOSIS — J449 Chronic obstructive pulmonary disease, unspecified: Secondary | ICD-10-CM | POA: Diagnosis not present

## 2016-11-02 DIAGNOSIS — I2510 Atherosclerotic heart disease of native coronary artery without angina pectoris: Secondary | ICD-10-CM | POA: Diagnosis not present

## 2016-11-09 DIAGNOSIS — I509 Heart failure, unspecified: Secondary | ICD-10-CM | POA: Diagnosis not present

## 2016-11-09 DIAGNOSIS — I10 Essential (primary) hypertension: Secondary | ICD-10-CM | POA: Diagnosis not present

## 2016-11-09 DIAGNOSIS — E785 Hyperlipidemia, unspecified: Secondary | ICD-10-CM | POA: Diagnosis not present

## 2016-11-09 DIAGNOSIS — N189 Chronic kidney disease, unspecified: Secondary | ICD-10-CM | POA: Diagnosis not present

## 2016-11-09 DIAGNOSIS — I2510 Atherosclerotic heart disease of native coronary artery without angina pectoris: Secondary | ICD-10-CM | POA: Diagnosis not present

## 2016-11-09 DIAGNOSIS — J449 Chronic obstructive pulmonary disease, unspecified: Secondary | ICD-10-CM | POA: Diagnosis not present

## 2016-11-13 ENCOUNTER — Other Ambulatory Visit: Payer: Self-pay | Admitting: Family Medicine

## 2016-11-14 DIAGNOSIS — N189 Chronic kidney disease, unspecified: Secondary | ICD-10-CM | POA: Diagnosis not present

## 2016-11-14 DIAGNOSIS — K219 Gastro-esophageal reflux disease without esophagitis: Secondary | ICD-10-CM | POA: Diagnosis not present

## 2016-11-14 DIAGNOSIS — J301 Allergic rhinitis due to pollen: Secondary | ICD-10-CM | POA: Diagnosis not present

## 2016-11-14 DIAGNOSIS — I2510 Atherosclerotic heart disease of native coronary artery without angina pectoris: Secondary | ICD-10-CM | POA: Diagnosis not present

## 2016-11-14 DIAGNOSIS — I10 Essential (primary) hypertension: Secondary | ICD-10-CM | POA: Diagnosis not present

## 2016-11-14 DIAGNOSIS — J449 Chronic obstructive pulmonary disease, unspecified: Secondary | ICD-10-CM | POA: Diagnosis not present

## 2016-11-14 DIAGNOSIS — E785 Hyperlipidemia, unspecified: Secondary | ICD-10-CM | POA: Diagnosis not present

## 2016-11-14 DIAGNOSIS — I509 Heart failure, unspecified: Secondary | ICD-10-CM | POA: Diagnosis not present

## 2016-11-14 DIAGNOSIS — G309 Alzheimer's disease, unspecified: Secondary | ICD-10-CM | POA: Diagnosis not present

## 2016-11-14 DIAGNOSIS — F339 Major depressive disorder, recurrent, unspecified: Secondary | ICD-10-CM | POA: Diagnosis not present

## 2016-11-14 NOTE — Telephone Encounter (Signed)
Last seen 09/19/16

## 2016-11-15 NOTE — Telephone Encounter (Signed)
They have 90 day supply with 1 refill

## 2016-11-17 DIAGNOSIS — H34832 Tributary (branch) retinal vein occlusion, left eye, with macular edema: Secondary | ICD-10-CM | POA: Diagnosis not present

## 2016-11-18 DIAGNOSIS — J449 Chronic obstructive pulmonary disease, unspecified: Secondary | ICD-10-CM | POA: Diagnosis not present

## 2016-11-18 DIAGNOSIS — N189 Chronic kidney disease, unspecified: Secondary | ICD-10-CM | POA: Diagnosis not present

## 2016-11-18 DIAGNOSIS — I10 Essential (primary) hypertension: Secondary | ICD-10-CM | POA: Diagnosis not present

## 2016-11-18 DIAGNOSIS — I509 Heart failure, unspecified: Secondary | ICD-10-CM | POA: Diagnosis not present

## 2016-11-18 DIAGNOSIS — I2510 Atherosclerotic heart disease of native coronary artery without angina pectoris: Secondary | ICD-10-CM | POA: Diagnosis not present

## 2016-11-18 DIAGNOSIS — E785 Hyperlipidemia, unspecified: Secondary | ICD-10-CM | POA: Diagnosis not present

## 2016-11-22 DIAGNOSIS — I509 Heart failure, unspecified: Secondary | ICD-10-CM | POA: Diagnosis not present

## 2016-11-22 DIAGNOSIS — I2510 Atherosclerotic heart disease of native coronary artery without angina pectoris: Secondary | ICD-10-CM | POA: Diagnosis not present

## 2016-11-22 DIAGNOSIS — E785 Hyperlipidemia, unspecified: Secondary | ICD-10-CM | POA: Diagnosis not present

## 2016-11-22 DIAGNOSIS — J449 Chronic obstructive pulmonary disease, unspecified: Secondary | ICD-10-CM | POA: Diagnosis not present

## 2016-11-22 DIAGNOSIS — I10 Essential (primary) hypertension: Secondary | ICD-10-CM | POA: Diagnosis not present

## 2016-11-22 DIAGNOSIS — N189 Chronic kidney disease, unspecified: Secondary | ICD-10-CM | POA: Diagnosis not present

## 2016-11-23 ENCOUNTER — Encounter: Payer: Self-pay | Admitting: Family Medicine

## 2016-11-23 ENCOUNTER — Ambulatory Visit (INDEPENDENT_AMBULATORY_CARE_PROVIDER_SITE_OTHER): Payer: Medicare Other | Admitting: Family Medicine

## 2016-11-23 VITALS — BP 132/80 | Ht 63.0 in | Wt 174.0 lb

## 2016-11-23 DIAGNOSIS — I10 Essential (primary) hypertension: Secondary | ICD-10-CM | POA: Diagnosis not present

## 2016-11-23 DIAGNOSIS — D692 Other nonthrombocytopenic purpura: Secondary | ICD-10-CM

## 2016-11-23 DIAGNOSIS — I2510 Atherosclerotic heart disease of native coronary artery without angina pectoris: Secondary | ICD-10-CM | POA: Diagnosis not present

## 2016-11-23 DIAGNOSIS — I509 Heart failure, unspecified: Secondary | ICD-10-CM | POA: Diagnosis not present

## 2016-11-23 DIAGNOSIS — J441 Chronic obstructive pulmonary disease with (acute) exacerbation: Secondary | ICD-10-CM | POA: Diagnosis not present

## 2016-11-23 DIAGNOSIS — Z23 Encounter for immunization: Secondary | ICD-10-CM

## 2016-11-23 DIAGNOSIS — J449 Chronic obstructive pulmonary disease, unspecified: Secondary | ICD-10-CM | POA: Diagnosis not present

## 2016-11-23 DIAGNOSIS — R238 Other skin changes: Secondary | ICD-10-CM

## 2016-11-23 DIAGNOSIS — I5032 Chronic diastolic (congestive) heart failure: Secondary | ICD-10-CM

## 2016-11-23 DIAGNOSIS — E785 Hyperlipidemia, unspecified: Secondary | ICD-10-CM | POA: Diagnosis not present

## 2016-11-23 DIAGNOSIS — R5383 Other fatigue: Secondary | ICD-10-CM | POA: Diagnosis not present

## 2016-11-23 DIAGNOSIS — N189 Chronic kidney disease, unspecified: Secondary | ICD-10-CM | POA: Diagnosis not present

## 2016-11-23 MED ORDER — BUDESONIDE-FORMOTEROL FUMARATE 160-4.5 MCG/ACT IN AERO: 2.0000 | INHALATION_SPRAY | Freq: Two times a day (BID) | RESPIRATORY_TRACT | 12 refills | Status: AC

## 2016-11-23 NOTE — Patient Instructions (Signed)
Stop compazine due to sedation side effect  Recheck in 3 to 4 months

## 2016-11-23 NOTE — Progress Notes (Signed)
Subjective:    Patient ID: [name], female    DOB: 1935/06/01, 81 y.o.   MRN: [id number]  HPI3 month follow up on COPD. SOB is worse. Could not get out of tub last week and O2 dropped in the 70's.  Patient relates that she got very out of breath trying to get up out of the bathtub. The patient spends majority of her time with minimal activity. She relates having discomfort with trying to get out of the bathtub along with shortness of breath and denied any chest pressure tightness pain.  Patient relates her breathing is gotten a fair amount worse she tries to keep her O2 saturations into the low to mid 90s. She gets short of breath with minimal activity. Does not do much in way of walking. Uses a cane or a walker to get around. Gets out of breath with activity. Denies chest pressure pain. Has history of severe COPD and in stage CHF under hospice care  Hard spots on scalp. Itching. Relates some scaling some dryness in the scalp itches intermittently  Weakness. Started 2 -3 months ago. Progressive weakness over the past few months appetite is fair takes her medicines as directed. One of the goals of hospice is to minimize blood work as well as hospitalizations and ER visits. Family and hospice has come to the agreement not to check lab work unless the situation changes  Left leg swelling. Started awhile ago. Getting worse. She relates intermittent swelling in the legs but is not having any currently today she does get pain in her joints but none in the muscles  Not sure which nausea med she should be taking zofran or compazine. Family states they use both but they prefer Zofran  Flu vaccine today.  Wants new shingles vaccine.   92     Review of Systems  Constitutional: Negative for activity change, fatigue and fever.  HENT: Negative for congestion.   Respiratory: Positive for shortness of breath and wheezing. Negative for cough and chest tightness.   Cardiovascular: Positive  for leg swelling. Negative for chest pain.  Gastrointestinal: Negative for abdominal pain, constipation and diarrhea.  Skin: Negative for color change.  Neurological: Positive for dizziness and weakness. Negative for headaches.  Psychiatric/Behavioral: Negative for behavioral problems.       Objective:   Physical Exam  Constitutional: She appears well-developed and well-nourished. No distress.  HENT:  Head: Normocephalic and atraumatic.  Eyes: Right eye exhibits no discharge. Left eye exhibits no discharge.  Neck: No tracheal deviation present.  Cardiovascular: Normal rate, regular rhythm and normal heart sounds.   No murmur heard. Pulmonary/Chest: Effort normal and breath sounds normal. No respiratory distress. She has no wheezes. She has no rales.  Musculoskeletal: She exhibits no edema.  Lymphadenopathy:    She has no cervical adenopathy.  Neurological: She is alert. She exhibits normal muscle tone.  Skin: Skin is warm and dry. No erythema.  Psychiatric: Her behavior is normal.  Vitals reviewed. Senile purpura on the arms   25 minutes was spent with the patient. Greater than half the time was spent in discussion and answering questions and counseling regarding the issues that the patient came in for today.      Assessment & Plan:  Worsening COPD continue current inhalers continue oxygen  Hypoxia with COPD continue oxygen  Chronic congestive heart failure does not appear to be acute currently under fair control  Poor prognosis long-term under hospice care  Significant fatigue related to  chronic conditioning's that are worsening for now patient may use bathtub but eventually it will need to be handled bath or using a shower seat family states they are rehabilitation 1  Follow-up in 3-4 months  Dry scalp medicated shampoos Selsun Blue 2-3 times per week, hold off on clobetasol liquid at this point  Senile purpura-stable no labs indicated

## 2016-12-01 ENCOUNTER — Telehealth: Payer: Self-pay | Admitting: Family Medicine

## 2016-12-01 DIAGNOSIS — I10 Essential (primary) hypertension: Secondary | ICD-10-CM | POA: Diagnosis not present

## 2016-12-01 DIAGNOSIS — N189 Chronic kidney disease, unspecified: Secondary | ICD-10-CM | POA: Diagnosis not present

## 2016-12-01 DIAGNOSIS — I509 Heart failure, unspecified: Secondary | ICD-10-CM | POA: Diagnosis not present

## 2016-12-01 DIAGNOSIS — I2510 Atherosclerotic heart disease of native coronary artery without angina pectoris: Secondary | ICD-10-CM | POA: Diagnosis not present

## 2016-12-01 DIAGNOSIS — E785 Hyperlipidemia, unspecified: Secondary | ICD-10-CM | POA: Diagnosis not present

## 2016-12-01 DIAGNOSIS — J449 Chronic obstructive pulmonary disease, unspecified: Secondary | ICD-10-CM | POA: Diagnosis not present

## 2016-12-01 NOTE — Telephone Encounter (Signed)
Nurse is with her today and said she is having some lower back pain.  Takes advil during the day with little relief. Takes Morphine at night and that helps but doesn't want to take that during the day.  Is there something we can give her for releif during the day? Please call nurse at cell # [phone]. She said it was ok if it has to wait until tomorrow when Dr. Lorin Picket is in the office.

## 2016-12-01 NOTE — Telephone Encounter (Signed)
I spoke with [name] and she says the pain is more from an old back injury and is not UTI related, she is not having any fevers,frequency,burning nor stinging while urinating. She takes the morphine QHS and gets plenty of rest. She would like something else called in to pharmacy for pain to use during the day. Please send to [facility].

## 2016-12-01 NOTE — Telephone Encounter (Signed)
Please inform the nurse that we are are extremely limited in her choices. Tramadol will not get along with a lot of the medicine she is on. The patient also states that codeine causes nausea vomiting and Dilaudid causes side effects. That only leaves hydrocodone or oxycodone. I truly do not want to use oxycodone because of side effect potential issues. I would recommend a trial of Vicodin 5 mg/325 one twice a day. Pain #30, if it causes nausea vomiting then obviously we can't use that. The other option is good ol Tylenol-inform the hospice nurse that I will not be back into the office until Monday if there is an emergent issue regarding this Dr. Brett Canales can handle-may continue to use morphine for evening use

## 2016-12-01 NOTE — Telephone Encounter (Signed)
I called and spoke with Shanda Bumps at [facility], tried to get a little more information regarding the pain. Whether having UTI Symptoms etc. Shanda Bumps RN with Hospice states she will have to call us back as she is currently with another patient.

## 2016-12-02 DIAGNOSIS — E785 Hyperlipidemia, unspecified: Secondary | ICD-10-CM | POA: Diagnosis not present

## 2016-12-02 DIAGNOSIS — I2510 Atherosclerotic heart disease of native coronary artery without angina pectoris: Secondary | ICD-10-CM | POA: Diagnosis not present

## 2016-12-02 DIAGNOSIS — I509 Heart failure, unspecified: Secondary | ICD-10-CM | POA: Diagnosis not present

## 2016-12-02 DIAGNOSIS — N189 Chronic kidney disease, unspecified: Secondary | ICD-10-CM | POA: Diagnosis not present

## 2016-12-02 DIAGNOSIS — J449 Chronic obstructive pulmonary disease, unspecified: Secondary | ICD-10-CM | POA: Diagnosis not present

## 2016-12-02 DIAGNOSIS — I10 Essential (primary) hypertension: Secondary | ICD-10-CM | POA: Diagnosis not present

## 2016-12-02 NOTE — Telephone Encounter (Signed)
Spoke with hospice nurse Shanda Bumps and informed her per Dr.Scott Luking-extremely limited in her choices. Tramadol will not get along with a lot of the medicine she is on. The patient also states that codeine causes nausea vomiting and Dilaudid causes side effects. That only leaves hydrocodone or oxycodone. I truly do not want to use oxycodone because of side effect potential issues. I would recommend a trial of Vicodin 5 mg/325 one twice a day. Pain #30, if it causes nausea vomiting then obviously we can't use that. The other option is good ol Tylenol-inform the hospice nurse that I will not be back into the office until Monday if there is an emergent issue regarding this Dr. Brett Canales can handle-may continue to use morphine for evening use. Shanda Bumps verbalized understanding and stated that she will talk with the family first and then let us know if they would like the prescription.

## 2016-12-07 DIAGNOSIS — J449 Chronic obstructive pulmonary disease, unspecified: Secondary | ICD-10-CM | POA: Diagnosis not present

## 2016-12-07 DIAGNOSIS — N189 Chronic kidney disease, unspecified: Secondary | ICD-10-CM | POA: Diagnosis not present

## 2016-12-07 DIAGNOSIS — E785 Hyperlipidemia, unspecified: Secondary | ICD-10-CM | POA: Diagnosis not present

## 2016-12-07 DIAGNOSIS — I509 Heart failure, unspecified: Secondary | ICD-10-CM | POA: Diagnosis not present

## 2016-12-07 DIAGNOSIS — I2510 Atherosclerotic heart disease of native coronary artery without angina pectoris: Secondary | ICD-10-CM | POA: Diagnosis not present

## 2016-12-07 DIAGNOSIS — I10 Essential (primary) hypertension: Secondary | ICD-10-CM | POA: Diagnosis not present

## 2016-12-13 DIAGNOSIS — I2510 Atherosclerotic heart disease of native coronary artery without angina pectoris: Secondary | ICD-10-CM | POA: Diagnosis not present

## 2016-12-13 DIAGNOSIS — I10 Essential (primary) hypertension: Secondary | ICD-10-CM | POA: Diagnosis not present

## 2016-12-13 DIAGNOSIS — E785 Hyperlipidemia, unspecified: Secondary | ICD-10-CM | POA: Diagnosis not present

## 2016-12-13 DIAGNOSIS — J449 Chronic obstructive pulmonary disease, unspecified: Secondary | ICD-10-CM | POA: Diagnosis not present

## 2016-12-13 DIAGNOSIS — I509 Heart failure, unspecified: Secondary | ICD-10-CM | POA: Diagnosis not present

## 2016-12-13 DIAGNOSIS — N189 Chronic kidney disease, unspecified: Secondary | ICD-10-CM | POA: Diagnosis not present

## 2016-12-15 DIAGNOSIS — E785 Hyperlipidemia, unspecified: Secondary | ICD-10-CM | POA: Diagnosis not present

## 2016-12-15 DIAGNOSIS — K219 Gastro-esophageal reflux disease without esophagitis: Secondary | ICD-10-CM | POA: Diagnosis not present

## 2016-12-15 DIAGNOSIS — J301 Allergic rhinitis due to pollen: Secondary | ICD-10-CM | POA: Diagnosis not present

## 2016-12-15 DIAGNOSIS — F339 Major depressive disorder, recurrent, unspecified: Secondary | ICD-10-CM | POA: Diagnosis not present

## 2016-12-15 DIAGNOSIS — I509 Heart failure, unspecified: Secondary | ICD-10-CM | POA: Diagnosis not present

## 2016-12-15 DIAGNOSIS — G309 Alzheimer's disease, unspecified: Secondary | ICD-10-CM | POA: Diagnosis not present

## 2016-12-15 DIAGNOSIS — I10 Essential (primary) hypertension: Secondary | ICD-10-CM | POA: Diagnosis not present

## 2016-12-15 DIAGNOSIS — N189 Chronic kidney disease, unspecified: Secondary | ICD-10-CM | POA: Diagnosis not present

## 2016-12-15 DIAGNOSIS — I2510 Atherosclerotic heart disease of native coronary artery without angina pectoris: Secondary | ICD-10-CM | POA: Diagnosis not present

## 2016-12-15 DIAGNOSIS — J449 Chronic obstructive pulmonary disease, unspecified: Secondary | ICD-10-CM | POA: Diagnosis not present

## 2016-12-16 ENCOUNTER — Telehealth: Payer: Self-pay | Admitting: Family Medicine

## 2016-12-16 NOTE — Telephone Encounter (Signed)
See [employer] form to be completed in yellow basket.  Call [place] when complete.

## 2016-12-18 ENCOUNTER — Other Ambulatory Visit: Payer: Self-pay | Admitting: Family Medicine

## 2016-12-18 DIAGNOSIS — E785 Hyperlipidemia, unspecified: Secondary | ICD-10-CM | POA: Diagnosis not present

## 2016-12-18 DIAGNOSIS — I10 Essential (primary) hypertension: Secondary | ICD-10-CM | POA: Diagnosis not present

## 2016-12-18 DIAGNOSIS — I509 Heart failure, unspecified: Secondary | ICD-10-CM | POA: Diagnosis not present

## 2016-12-18 DIAGNOSIS — J449 Chronic obstructive pulmonary disease, unspecified: Secondary | ICD-10-CM | POA: Diagnosis not present

## 2016-12-18 DIAGNOSIS — N189 Chronic kidney disease, unspecified: Secondary | ICD-10-CM | POA: Diagnosis not present

## 2016-12-18 DIAGNOSIS — I2510 Atherosclerotic heart disease of native coronary artery without angina pectoris: Secondary | ICD-10-CM | POA: Diagnosis not present

## 2016-12-18 NOTE — Telephone Encounter (Signed)
Form was completed as requested 

## 2016-12-19 NOTE — Telephone Encounter (Signed)
This +5 refills 

## 2016-12-21 ENCOUNTER — Telehealth: Payer: Self-pay | Admitting: Family Medicine

## 2016-12-21 DIAGNOSIS — J449 Chronic obstructive pulmonary disease, unspecified: Secondary | ICD-10-CM | POA: Diagnosis not present

## 2016-12-21 DIAGNOSIS — I2510 Atherosclerotic heart disease of native coronary artery without angina pectoris: Secondary | ICD-10-CM | POA: Diagnosis not present

## 2016-12-21 DIAGNOSIS — I10 Essential (primary) hypertension: Secondary | ICD-10-CM | POA: Diagnosis not present

## 2016-12-21 DIAGNOSIS — E785 Hyperlipidemia, unspecified: Secondary | ICD-10-CM | POA: Diagnosis not present

## 2016-12-21 DIAGNOSIS — I509 Heart failure, unspecified: Secondary | ICD-10-CM | POA: Diagnosis not present

## 2016-12-21 DIAGNOSIS — N189 Chronic kidney disease, unspecified: Secondary | ICD-10-CM | POA: Diagnosis not present

## 2016-12-21 DIAGNOSIS — H34832 Tributary (branch) retinal vein occlusion, left eye, with macular edema: Secondary | ICD-10-CM | POA: Diagnosis not present

## 2016-12-21 NOTE — Telephone Encounter (Signed)
ERROR

## 2016-12-29 ENCOUNTER — Telehealth: Payer: Self-pay | Admitting: Family Medicine

## 2016-12-29 DIAGNOSIS — I10 Essential (primary) hypertension: Secondary | ICD-10-CM | POA: Diagnosis not present

## 2016-12-29 DIAGNOSIS — N189 Chronic kidney disease, unspecified: Secondary | ICD-10-CM | POA: Diagnosis not present

## 2016-12-29 DIAGNOSIS — I2510 Atherosclerotic heart disease of native coronary artery without angina pectoris: Secondary | ICD-10-CM | POA: Diagnosis not present

## 2016-12-29 DIAGNOSIS — J449 Chronic obstructive pulmonary disease, unspecified: Secondary | ICD-10-CM | POA: Diagnosis not present

## 2016-12-29 DIAGNOSIS — E785 Hyperlipidemia, unspecified: Secondary | ICD-10-CM | POA: Diagnosis not present

## 2016-12-29 DIAGNOSIS — I509 Heart failure, unspecified: Secondary | ICD-10-CM | POA: Diagnosis not present

## 2016-12-29 NOTE — Telephone Encounter (Signed)
Spoke with patient and informed her per Dr.Scott Luking-  One option is using Klonopin 0.5 mg tablet-she could take a half a tablet every 8 hours during the day as needed to help with tremor and take a full tablet at nighttime to help with rest as needed and use this in place of lorazepam.  If they are interested in this then we can call in a prescription of Klonopin to take the place of lorazepam with the above instructions, #60, 3 refills.  Also if they would feel more comfortable having [name] see her we could work her into the schedule somewhere within the next 10 days. Home Health Nurse stated that she would contact patient's granddaughter and see what direction they would like to go

## 2016-12-29 NOTE — Telephone Encounter (Signed)
Received a phone call from [facility] with Hospice stating that patient has c/o Tremors worsening in hands. Patient is now having a difficult time holding on to objects. Please advise?  May call [phone]

## 2016-12-29 NOTE — Telephone Encounter (Signed)
Please discuss with hospice nurse.  One option is using Klonopin 0.5 mg tablet-she could take a half a tablet every 8 hours during the day as needed to help with tremor and take a full tablet at nighttime to help with rest as needed and use this in place of lorazepam.  If they are interested in this then we can call in a prescription of Klonopin to take the place of lorazepam with the above instructions, #60, 3 refills.  Also if they would feel more comfortable having [name] see her we could work her into the schedule somewhere within the next 10 days

## 2016-12-30 ENCOUNTER — Other Ambulatory Visit: Payer: Self-pay

## 2016-12-30 ENCOUNTER — Telehealth: Payer: Self-pay | Admitting: Family Medicine

## 2016-12-30 ENCOUNTER — Other Ambulatory Visit: Payer: Self-pay | Admitting: Family Medicine

## 2016-12-30 MED ORDER — CLONAZEPAM 0.5 MG PO TABS: ORAL_TABLET | ORAL | 5 refills | Status: DC

## 2016-12-30 NOTE — Telephone Encounter (Signed)
Received a phone call from home health nurse with Hospice  in regards to the call from yesterday about the tremors Dr.Scott Luking had offered to change patient from Ativan to Klonopin for tremors. Klonopin 0.5 mg tke 1/2 tablet 3 times a day at bedtime as needed for tremors. Home Health Nurse Shanda Bumps states granddaughter has concerns of leaving a PRN medication with patient throughout the day with fear she will take all 3 at one time. Home Health nurse wants to know if we can change prescription to take 1/2 tablet one in the morning and one at night on the Klonopin. Please advise?

## 2016-12-30 NOTE — Telephone Encounter (Signed)
Changed the prescription Home Health nurse Jessica Notified.

## 2016-12-30 NOTE — Addendum Note (Signed)
Addended by: Theodora Blow R on: 12/30/2016 11:06 AM   Modules accepted: Orders

## 2016-12-30 NOTE — Telephone Encounter (Signed)
Yes change

## 2017-01-03 DIAGNOSIS — I2510 Atherosclerotic heart disease of native coronary artery without angina pectoris: Secondary | ICD-10-CM | POA: Diagnosis not present

## 2017-01-03 DIAGNOSIS — N189 Chronic kidney disease, unspecified: Secondary | ICD-10-CM | POA: Diagnosis not present

## 2017-01-03 DIAGNOSIS — I509 Heart failure, unspecified: Secondary | ICD-10-CM | POA: Diagnosis not present

## 2017-01-03 DIAGNOSIS — E785 Hyperlipidemia, unspecified: Secondary | ICD-10-CM | POA: Diagnosis not present

## 2017-01-03 DIAGNOSIS — I10 Essential (primary) hypertension: Secondary | ICD-10-CM | POA: Diagnosis not present

## 2017-01-03 DIAGNOSIS — J449 Chronic obstructive pulmonary disease, unspecified: Secondary | ICD-10-CM | POA: Diagnosis not present

## 2017-01-09 DIAGNOSIS — E785 Hyperlipidemia, unspecified: Secondary | ICD-10-CM | POA: Diagnosis not present

## 2017-01-09 DIAGNOSIS — I10 Essential (primary) hypertension: Secondary | ICD-10-CM | POA: Diagnosis not present

## 2017-01-09 DIAGNOSIS — N189 Chronic kidney disease, unspecified: Secondary | ICD-10-CM | POA: Diagnosis not present

## 2017-01-09 DIAGNOSIS — I509 Heart failure, unspecified: Secondary | ICD-10-CM | POA: Diagnosis not present

## 2017-01-09 DIAGNOSIS — I2510 Atherosclerotic heart disease of native coronary artery without angina pectoris: Secondary | ICD-10-CM | POA: Diagnosis not present

## 2017-01-09 DIAGNOSIS — J449 Chronic obstructive pulmonary disease, unspecified: Secondary | ICD-10-CM | POA: Diagnosis not present

## 2017-01-11 ENCOUNTER — Telehealth: Payer: Self-pay | Admitting: *Deleted

## 2017-01-11 DIAGNOSIS — I509 Heart failure, unspecified: Secondary | ICD-10-CM | POA: Diagnosis not present

## 2017-01-11 DIAGNOSIS — E785 Hyperlipidemia, unspecified: Secondary | ICD-10-CM | POA: Diagnosis not present

## 2017-01-11 DIAGNOSIS — I2510 Atherosclerotic heart disease of native coronary artery without angina pectoris: Secondary | ICD-10-CM | POA: Diagnosis not present

## 2017-01-11 DIAGNOSIS — J449 Chronic obstructive pulmonary disease, unspecified: Secondary | ICD-10-CM | POA: Diagnosis not present

## 2017-01-11 DIAGNOSIS — N189 Chronic kidney disease, unspecified: Secondary | ICD-10-CM | POA: Diagnosis not present

## 2017-01-11 DIAGNOSIS — I10 Essential (primary) hypertension: Secondary | ICD-10-CM | POA: Diagnosis not present

## 2017-01-11 NOTE — Telephone Encounter (Signed)
It would be fine to change it to this requested amount please write this out, change orders in Epic to reflect this, I will sign the order then we can send it in

## 2017-01-11 NOTE — Telephone Encounter (Signed)
Hospice nurse Forrest Moron calling to get a med changed. Lorazepam was D/C due to tremors last week. Changed to klonopin 0.5mg  directions were for one half qam and one at night. When taking this she was unsteady on feet and drowsy during the day. Wants to know if order could be changed to one half qam and one half at bedtime. Shanda Bumps will be going out today at 3:30.  Forrest Moron - [phone].

## 2017-01-11 NOTE — Telephone Encounter (Signed)
Nurses-please get an update from either a family member such as her husband or daughter or granddaughter on how they feel she is doing currently

## 2017-01-11 NOTE — Telephone Encounter (Signed)
Called home health nurse Shanda Bumps and she is now at the house and now wants to do just one half at half. She states she is not moving much air and has noticed a change. She will go out again and check her on Thursday. She took a verbal order for the klonopin to do one half at night and wanted to give you an update.

## 2017-01-12 NOTE — Telephone Encounter (Signed)
Spoke with granddaughter Misty Stanley. Not doing well, declined a lot since last Thursday. Not sure if its the med or if she is just taking a turn for the worse. Not giving klonopin in the morning and a fourth tablet at night. Cannot move around with out holding on to everything, wheezing bad last night. Real sob. Gave breathing treatment. Very confused. Getting anxious and shaking a lot. She thinks shaking is from muscle failure.

## 2017-01-13 DIAGNOSIS — J449 Chronic obstructive pulmonary disease, unspecified: Secondary | ICD-10-CM | POA: Diagnosis not present

## 2017-01-13 DIAGNOSIS — I2510 Atherosclerotic heart disease of native coronary artery without angina pectoris: Secondary | ICD-10-CM | POA: Diagnosis not present

## 2017-01-13 DIAGNOSIS — I509 Heart failure, unspecified: Secondary | ICD-10-CM | POA: Diagnosis not present

## 2017-01-13 DIAGNOSIS — I10 Essential (primary) hypertension: Secondary | ICD-10-CM | POA: Diagnosis not present

## 2017-01-13 DIAGNOSIS — E785 Hyperlipidemia, unspecified: Secondary | ICD-10-CM | POA: Diagnosis not present

## 2017-01-13 DIAGNOSIS — N189 Chronic kidney disease, unspecified: Secondary | ICD-10-CM | POA: Diagnosis not present

## 2017-01-14 DIAGNOSIS — F339 Major depressive disorder, recurrent, unspecified: Secondary | ICD-10-CM | POA: Diagnosis not present

## 2017-01-14 DIAGNOSIS — N189 Chronic kidney disease, unspecified: Secondary | ICD-10-CM | POA: Diagnosis not present

## 2017-01-14 DIAGNOSIS — I10 Essential (primary) hypertension: Secondary | ICD-10-CM | POA: Diagnosis not present

## 2017-01-14 DIAGNOSIS — J449 Chronic obstructive pulmonary disease, unspecified: Secondary | ICD-10-CM | POA: Diagnosis not present

## 2017-01-14 DIAGNOSIS — G309 Alzheimer's disease, unspecified: Secondary | ICD-10-CM | POA: Diagnosis not present

## 2017-01-14 DIAGNOSIS — E785 Hyperlipidemia, unspecified: Secondary | ICD-10-CM | POA: Diagnosis not present

## 2017-01-14 DIAGNOSIS — I509 Heart failure, unspecified: Secondary | ICD-10-CM | POA: Diagnosis not present

## 2017-01-14 DIAGNOSIS — K219 Gastro-esophageal reflux disease without esophagitis: Secondary | ICD-10-CM | POA: Diagnosis not present

## 2017-01-14 DIAGNOSIS — I2510 Atherosclerotic heart disease of native coronary artery without angina pectoris: Secondary | ICD-10-CM | POA: Diagnosis not present

## 2017-01-14 DIAGNOSIS — J301 Allergic rhinitis due to pollen: Secondary | ICD-10-CM | POA: Diagnosis not present

## 2017-01-15 ENCOUNTER — Other Ambulatory Visit: Payer: Self-pay | Admitting: Family Medicine

## 2017-01-15 DIAGNOSIS — I10 Essential (primary) hypertension: Secondary | ICD-10-CM | POA: Diagnosis not present

## 2017-01-15 DIAGNOSIS — I2510 Atherosclerotic heart disease of native coronary artery without angina pectoris: Secondary | ICD-10-CM | POA: Diagnosis not present

## 2017-01-15 DIAGNOSIS — E785 Hyperlipidemia, unspecified: Secondary | ICD-10-CM | POA: Diagnosis not present

## 2017-01-15 DIAGNOSIS — J449 Chronic obstructive pulmonary disease, unspecified: Secondary | ICD-10-CM | POA: Diagnosis not present

## 2017-01-15 DIAGNOSIS — N189 Chronic kidney disease, unspecified: Secondary | ICD-10-CM | POA: Diagnosis not present

## 2017-01-15 DIAGNOSIS — I509 Heart failure, unspecified: Secondary | ICD-10-CM | POA: Diagnosis not present

## 2017-01-16 ENCOUNTER — Other Ambulatory Visit: Payer: Self-pay | Admitting: Family Medicine

## 2017-01-18 ENCOUNTER — Telehealth: Payer: Self-pay | Admitting: Family Medicine

## 2017-01-18 ENCOUNTER — Other Ambulatory Visit: Payer: Self-pay | Admitting: *Deleted

## 2017-01-18 DIAGNOSIS — N189 Chronic kidney disease, unspecified: Secondary | ICD-10-CM | POA: Diagnosis not present

## 2017-01-18 DIAGNOSIS — J449 Chronic obstructive pulmonary disease, unspecified: Secondary | ICD-10-CM | POA: Diagnosis not present

## 2017-01-18 DIAGNOSIS — E785 Hyperlipidemia, unspecified: Secondary | ICD-10-CM | POA: Diagnosis not present

## 2017-01-18 DIAGNOSIS — I509 Heart failure, unspecified: Secondary | ICD-10-CM | POA: Diagnosis not present

## 2017-01-18 DIAGNOSIS — I10 Essential (primary) hypertension: Secondary | ICD-10-CM | POA: Diagnosis not present

## 2017-01-18 DIAGNOSIS — I2510 Atherosclerotic heart disease of native coronary artery without angina pectoris: Secondary | ICD-10-CM | POA: Diagnosis not present

## 2017-01-18 MED ORDER — ONDANSETRON 8 MG PO TBDP: 8.0000 mg | ORAL_TABLET | Freq: Three times a day (TID) | ORAL | 5 refills | Status: DC | PRN

## 2017-01-18 MED ORDER — ONDANSETRON 8 MG PO TBDP: 8.0000 mg | ORAL_TABLET | Freq: Three times a day (TID) | ORAL | 5 refills | Status: AC | PRN

## 2017-01-18 NOTE — Telephone Encounter (Signed)
zofran 8 mg ODT, 1 tid prn nausea, #15, 5 refills HOSPICE patient

## 2017-01-18 NOTE — Telephone Encounter (Signed)
Med sent to pharm. Hospice nurse [facility] notified. [name] states pt has really declined the past two weeks. She was very pale today, not moving air around much but still getting up and moving around

## 2017-01-18 NOTE — Telephone Encounter (Signed)
Nurses-please talk with family.  Find out if I can come out for a visit potentially Friday at the lunch hour?  Please verify that she lives on [facility]

## 2017-01-18 NOTE — Telephone Encounter (Signed)
Please advise 

## 2017-01-18 NOTE — Telephone Encounter (Signed)
Pt having a lot of dizziness & nausea  Hospice nurse is requesting order for Zofran to help get patient some comfort   [facility] in [place]   If Dr. Lorin Picket feels this is not OK, please call Freddi Che w/Hospice to let her know [phone]

## 2017-01-19 NOTE — Telephone Encounter (Signed)
Granddaughter [name] is aware that Dr.Scott will be at the pt home 01/20/2017 at 11:30 or there about for a home visit. Her address is [address] [place]. Granddaughter will not be there,but pt husband will be. [name] says to call her at [phone] if needed.

## 2017-01-20 ENCOUNTER — Ambulatory Visit: Payer: Medicare Other | Admitting: Family Medicine

## 2017-01-20 ENCOUNTER — Telehealth: Payer: Self-pay | Admitting: Family Medicine

## 2017-01-20 DIAGNOSIS — F0390 Unspecified dementia without behavioral disturbance: Secondary | ICD-10-CM | POA: Diagnosis not present

## 2017-01-20 DIAGNOSIS — J441 Chronic obstructive pulmonary disease with (acute) exacerbation: Secondary | ICD-10-CM | POA: Diagnosis not present

## 2017-01-20 MED ORDER — CEFPROZIL 250 MG PO TABS: 250.0000 mg | ORAL_TABLET | Freq: Two times a day (BID) | ORAL | 0 refills | Status: DC

## 2017-01-20 NOTE — Telephone Encounter (Signed)
Appointment rescheduled to 01/25/17.

## 2017-01-20 NOTE — Progress Notes (Signed)
A home visit was completed for this patient This patient is a hospice patient She has been having increased congestion bringing up some discolored phlegm Some intermittent nausea In addition to this increased breathing difficulties She has severe COPD and end-stage CHF She is well-known to this practice please see medical history surgical medical and medication list Recently we send in Zofran for the nausea she had been using Compazine At times she is somewhat irritable and hateful to her family but most of the time she hangs in there blood pressure 128/70 Respiratory rate 16 Heart rate 90 and controlled Weight was not checked Neck no masses Patient alert oriented Moving air poorly but not in respiratory distress Some bilateral wheezes noted Patient reports coughing up discolored phlegm Heart without murmurs Extremities no edema Neurologic grossly normal A/P patient does have some mild dementia and depression issues under fair control with current medications no changes Severe COPD with exacerbation it does not need prednisone currently but we will go ahead with some antibiotics over the next 7 days No sign of CHF flareup currently We will follow-up the patient periodically has home visits

## 2017-01-20 NOTE — Telephone Encounter (Signed)
Let them know currently due to increased demand here at the office I can not do home visit today. We will rexschedule into next week

## 2017-01-20 NOTE — Telephone Encounter (Signed)
I did do a home visit as requested

## 2017-01-25 ENCOUNTER — Ambulatory Visit: Payer: Medicare Other | Admitting: Family Medicine

## 2017-01-27 DIAGNOSIS — I10 Essential (primary) hypertension: Secondary | ICD-10-CM | POA: Diagnosis not present

## 2017-01-27 DIAGNOSIS — I509 Heart failure, unspecified: Secondary | ICD-10-CM | POA: Diagnosis not present

## 2017-01-27 DIAGNOSIS — H34832 Tributary (branch) retinal vein occlusion, left eye, with macular edema: Secondary | ICD-10-CM | POA: Diagnosis not present

## 2017-01-27 DIAGNOSIS — E785 Hyperlipidemia, unspecified: Secondary | ICD-10-CM | POA: Diagnosis not present

## 2017-01-27 DIAGNOSIS — N189 Chronic kidney disease, unspecified: Secondary | ICD-10-CM | POA: Diagnosis not present

## 2017-01-27 DIAGNOSIS — I2510 Atherosclerotic heart disease of native coronary artery without angina pectoris: Secondary | ICD-10-CM | POA: Diagnosis not present

## 2017-01-27 DIAGNOSIS — J449 Chronic obstructive pulmonary disease, unspecified: Secondary | ICD-10-CM | POA: Diagnosis not present

## 2017-02-02 ENCOUNTER — Other Ambulatory Visit: Payer: Self-pay | Admitting: Nurse Practitioner

## 2017-02-02 ENCOUNTER — Encounter: Payer: Self-pay | Admitting: Family Medicine

## 2017-02-02 DIAGNOSIS — E785 Hyperlipidemia, unspecified: Secondary | ICD-10-CM | POA: Diagnosis not present

## 2017-02-02 DIAGNOSIS — I509 Heart failure, unspecified: Secondary | ICD-10-CM | POA: Diagnosis not present

## 2017-02-02 DIAGNOSIS — I10 Essential (primary) hypertension: Secondary | ICD-10-CM | POA: Diagnosis not present

## 2017-02-02 DIAGNOSIS — N189 Chronic kidney disease, unspecified: Secondary | ICD-10-CM | POA: Diagnosis not present

## 2017-02-02 DIAGNOSIS — J449 Chronic obstructive pulmonary disease, unspecified: Secondary | ICD-10-CM | POA: Diagnosis not present

## 2017-02-02 DIAGNOSIS — I2510 Atherosclerotic heart disease of native coronary artery without angina pectoris: Secondary | ICD-10-CM | POA: Diagnosis not present

## 2017-02-02 MED ORDER — ERYTHROMYCIN 5 MG/GM OP OINT: 1.0000 "application " | TOPICAL_OINTMENT | Freq: Four times a day (QID) | OPHTHALMIC | 0 refills | Status: DC

## 2017-02-09 DIAGNOSIS — I2510 Atherosclerotic heart disease of native coronary artery without angina pectoris: Secondary | ICD-10-CM | POA: Diagnosis not present

## 2017-02-09 DIAGNOSIS — J449 Chronic obstructive pulmonary disease, unspecified: Secondary | ICD-10-CM | POA: Diagnosis not present

## 2017-02-09 DIAGNOSIS — N189 Chronic kidney disease, unspecified: Secondary | ICD-10-CM | POA: Diagnosis not present

## 2017-02-09 DIAGNOSIS — E785 Hyperlipidemia, unspecified: Secondary | ICD-10-CM | POA: Diagnosis not present

## 2017-02-09 DIAGNOSIS — I509 Heart failure, unspecified: Secondary | ICD-10-CM | POA: Diagnosis not present

## 2017-02-09 DIAGNOSIS — I10 Essential (primary) hypertension: Secondary | ICD-10-CM | POA: Diagnosis not present

## 2017-02-10 ENCOUNTER — Other Ambulatory Visit: Payer: Self-pay

## 2017-02-10 ENCOUNTER — Encounter (HOSPITAL_COMMUNITY): Payer: Self-pay | Admitting: Emergency Medicine

## 2017-02-10 ENCOUNTER — Emergency Department (HOSPITAL_COMMUNITY)

## 2017-02-10 ENCOUNTER — Inpatient Hospital Stay (HOSPITAL_COMMUNITY)
Admission: EM | Admit: 2017-02-10 | Discharge: 2017-02-17 | DRG: 470 | Disposition: A | Discharge status: Skilled Nursing Facility | Attending: Family Medicine | Admitting: Family Medicine

## 2017-02-10 DIAGNOSIS — I2510 Atherosclerotic heart disease of native coronary artery without angina pectoris: Secondary | ICD-10-CM | POA: Diagnosis not present

## 2017-02-10 DIAGNOSIS — F419 Anxiety disorder, unspecified: Secondary | ICD-10-CM | POA: Diagnosis not present

## 2017-02-10 DIAGNOSIS — N183 Chronic kidney disease, stage 3 unspecified: Secondary | ICD-10-CM | POA: Diagnosis present

## 2017-02-10 DIAGNOSIS — K219 Gastro-esophageal reflux disease without esophagitis: Secondary | ICD-10-CM | POA: Diagnosis present

## 2017-02-10 DIAGNOSIS — Z9849 Cataract extraction status, unspecified eye: Secondary | ICD-10-CM

## 2017-02-10 DIAGNOSIS — Z01818 Encounter for other preprocedural examination: Secondary | ICD-10-CM | POA: Diagnosis not present

## 2017-02-10 DIAGNOSIS — Z7401 Bed confinement status: Secondary | ICD-10-CM | POA: Diagnosis not present

## 2017-02-10 DIAGNOSIS — M810 Age-related osteoporosis without current pathological fracture: Secondary | ICD-10-CM | POA: Diagnosis present

## 2017-02-10 DIAGNOSIS — Z9981 Dependence on supplemental oxygen: Secondary | ICD-10-CM

## 2017-02-10 DIAGNOSIS — Z515 Encounter for palliative care: Secondary | ICD-10-CM | POA: Diagnosis present

## 2017-02-10 DIAGNOSIS — G20 Parkinson's disease: Secondary | ICD-10-CM | POA: Diagnosis not present

## 2017-02-10 DIAGNOSIS — Y92009 Unspecified place in unspecified non-institutional (private) residence as the place of occurrence of the external cause: Secondary | ICD-10-CM | POA: Diagnosis not present

## 2017-02-10 DIAGNOSIS — N179 Acute kidney failure, unspecified: Secondary | ICD-10-CM | POA: Diagnosis not present

## 2017-02-10 DIAGNOSIS — I25708 Atherosclerosis of coronary artery bypass graft(s), unspecified, with other forms of angina pectoris: Secondary | ICD-10-CM

## 2017-02-10 DIAGNOSIS — S72041A Displaced fracture of base of neck of right femur, initial encounter for closed fracture: Secondary | ICD-10-CM | POA: Diagnosis not present

## 2017-02-10 DIAGNOSIS — I5032 Chronic diastolic (congestive) heart failure: Secondary | ICD-10-CM | POA: Diagnosis not present

## 2017-02-10 DIAGNOSIS — Z87891 Personal history of nicotine dependence: Secondary | ICD-10-CM

## 2017-02-10 DIAGNOSIS — D62 Acute posthemorrhagic anemia: Secondary | ICD-10-CM | POA: Diagnosis not present

## 2017-02-10 DIAGNOSIS — Z79899 Other long term (current) drug therapy: Secondary | ICD-10-CM

## 2017-02-10 DIAGNOSIS — G250 Essential tremor: Secondary | ICD-10-CM | POA: Diagnosis present

## 2017-02-10 DIAGNOSIS — S299XXA Unspecified injury of thorax, initial encounter: Secondary | ICD-10-CM | POA: Diagnosis not present

## 2017-02-10 DIAGNOSIS — T148XXA Other injury of unspecified body region, initial encounter: Secondary | ICD-10-CM | POA: Diagnosis not present

## 2017-02-10 DIAGNOSIS — R9431 Abnormal electrocardiogram [ECG] [EKG]: Secondary | ICD-10-CM | POA: Diagnosis not present

## 2017-02-10 DIAGNOSIS — Z419 Encounter for procedure for purposes other than remedying health state, unspecified: Secondary | ICD-10-CM

## 2017-02-10 DIAGNOSIS — Z881 Allergy status to other antibiotic agents status: Secondary | ICD-10-CM

## 2017-02-10 DIAGNOSIS — I959 Hypotension, unspecified: Secondary | ICD-10-CM | POA: Diagnosis present

## 2017-02-10 DIAGNOSIS — Z90711 Acquired absence of uterus with remaining cervical stump: Secondary | ICD-10-CM

## 2017-02-10 DIAGNOSIS — S72001A Fracture of unspecified part of neck of right femur, initial encounter for closed fracture: Secondary | ICD-10-CM | POA: Diagnosis not present

## 2017-02-10 DIAGNOSIS — F0390 Unspecified dementia without behavioral disturbance: Secondary | ICD-10-CM | POA: Diagnosis not present

## 2017-02-10 DIAGNOSIS — I130 Hypertensive heart and chronic kidney disease with heart failure and stage 1 through stage 4 chronic kidney disease, or unspecified chronic kidney disease: Secondary | ICD-10-CM | POA: Diagnosis present

## 2017-02-10 DIAGNOSIS — Z8781 Personal history of (healed) traumatic fracture: Secondary | ICD-10-CM

## 2017-02-10 DIAGNOSIS — E876 Hypokalemia: Secondary | ICD-10-CM | POA: Diagnosis present

## 2017-02-10 DIAGNOSIS — Z96641 Presence of right artificial hip joint: Secondary | ICD-10-CM | POA: Diagnosis not present

## 2017-02-10 DIAGNOSIS — Z882 Allergy status to sulfonamides status: Secondary | ICD-10-CM

## 2017-02-10 DIAGNOSIS — Z880 Allergy status to penicillin: Secondary | ICD-10-CM

## 2017-02-10 DIAGNOSIS — D631 Anemia in chronic kidney disease: Secondary | ICD-10-CM | POA: Diagnosis present

## 2017-02-10 DIAGNOSIS — Z66 Do not resuscitate: Secondary | ICD-10-CM | POA: Diagnosis not present

## 2017-02-10 DIAGNOSIS — J302 Other seasonal allergic rhinitis: Secondary | ICD-10-CM | POA: Diagnosis present

## 2017-02-10 DIAGNOSIS — S72009A Fracture of unspecified part of neck of unspecified femur, initial encounter for closed fracture: Secondary | ICD-10-CM | POA: Diagnosis not present

## 2017-02-10 DIAGNOSIS — Z7951 Long term (current) use of inhaled steroids: Secondary | ICD-10-CM

## 2017-02-10 DIAGNOSIS — Z9861 Coronary angioplasty status: Secondary | ICD-10-CM

## 2017-02-10 DIAGNOSIS — I509 Heart failure, unspecified: Secondary | ICD-10-CM | POA: Diagnosis not present

## 2017-02-10 DIAGNOSIS — M25559 Pain in unspecified hip: Secondary | ICD-10-CM | POA: Diagnosis not present

## 2017-02-10 DIAGNOSIS — W010XXA Fall on same level from slipping, tripping and stumbling without subsequent striking against object, initial encounter: Secondary | ICD-10-CM | POA: Diagnosis not present

## 2017-02-10 DIAGNOSIS — Z6829 Body mass index (BMI) 29.0-29.9, adult: Secondary | ICD-10-CM

## 2017-02-10 DIAGNOSIS — J449 Chronic obstructive pulmonary disease, unspecified: Secondary | ICD-10-CM | POA: Diagnosis present

## 2017-02-10 DIAGNOSIS — Z885 Allergy status to narcotic agent status: Secondary | ICD-10-CM

## 2017-02-10 DIAGNOSIS — R262 Difficulty in walking, not elsewhere classified: Secondary | ICD-10-CM | POA: Diagnosis not present

## 2017-02-10 DIAGNOSIS — I252 Old myocardial infarction: Secondary | ICD-10-CM

## 2017-02-10 DIAGNOSIS — R0902 Hypoxemia: Secondary | ICD-10-CM | POA: Diagnosis not present

## 2017-02-10 DIAGNOSIS — I10 Essential (primary) hypertension: Secondary | ICD-10-CM | POA: Diagnosis not present

## 2017-02-10 DIAGNOSIS — S7291XA Unspecified fracture of right femur, initial encounter for closed fracture: Secondary | ICD-10-CM | POA: Diagnosis not present

## 2017-02-10 DIAGNOSIS — R4182 Altered mental status, unspecified: Secondary | ICD-10-CM | POA: Diagnosis not present

## 2017-02-10 DIAGNOSIS — F411 Generalized anxiety disorder: Secondary | ICD-10-CM | POA: Diagnosis not present

## 2017-02-10 DIAGNOSIS — Z9181 History of falling: Secondary | ICD-10-CM | POA: Diagnosis not present

## 2017-02-10 DIAGNOSIS — Z471 Aftercare following joint replacement surgery: Secondary | ICD-10-CM | POA: Diagnosis not present

## 2017-02-10 DIAGNOSIS — J9611 Chronic respiratory failure with hypoxia: Secondary | ICD-10-CM | POA: Diagnosis not present

## 2017-02-10 DIAGNOSIS — E669 Obesity, unspecified: Secondary | ICD-10-CM | POA: Diagnosis present

## 2017-02-10 DIAGNOSIS — N189 Chronic kidney disease, unspecified: Secondary | ICD-10-CM | POA: Diagnosis not present

## 2017-02-10 DIAGNOSIS — S72091A Other fracture of head and neck of right femur, initial encounter for closed fracture: Secondary | ICD-10-CM | POA: Diagnosis not present

## 2017-02-10 DIAGNOSIS — R279 Unspecified lack of coordination: Secondary | ICD-10-CM | POA: Diagnosis not present

## 2017-02-10 DIAGNOSIS — S72011A Unspecified intracapsular fracture of right femur, initial encounter for closed fracture: Secondary | ICD-10-CM | POA: Diagnosis not present

## 2017-02-10 DIAGNOSIS — E785 Hyperlipidemia, unspecified: Secondary | ICD-10-CM | POA: Diagnosis not present

## 2017-02-10 DIAGNOSIS — M6281 Muscle weakness (generalized): Secondary | ICD-10-CM | POA: Diagnosis not present

## 2017-02-10 DIAGNOSIS — J439 Emphysema, unspecified: Secondary | ICD-10-CM | POA: Diagnosis not present

## 2017-02-10 DIAGNOSIS — S72001D Fracture of unspecified part of neck of right femur, subsequent encounter for closed fracture with routine healing: Secondary | ICD-10-CM | POA: Diagnosis not present

## 2017-02-10 DIAGNOSIS — M79604 Pain in right leg: Secondary | ICD-10-CM | POA: Diagnosis not present

## 2017-02-10 DIAGNOSIS — Z96642 Presence of left artificial hip joint: Secondary | ICD-10-CM | POA: Diagnosis not present

## 2017-02-10 DIAGNOSIS — Z96649 Presence of unspecified artificial hip joint: Secondary | ICD-10-CM

## 2017-02-10 HISTORY — DX: Unspecified chronic bronchitis: J42

## 2017-02-10 HISTORY — DX: Gastro-esophageal reflux disease without esophagitis: K21.9

## 2017-02-10 HISTORY — DX: Anxiety disorder, unspecified: F41.9

## 2017-02-10 HISTORY — DX: Unspecified osteoarthritis, unspecified site: M19.90

## 2017-02-10 HISTORY — DX: Chronic kidney disease, stage 3 (moderate): N18.3

## 2017-02-10 HISTORY — DX: Cardiac murmur, unspecified: R01.1

## 2017-02-10 HISTORY — DX: Headache: R51

## 2017-02-10 HISTORY — DX: Personal history of other medical treatment: Z92.89

## 2017-02-10 HISTORY — DX: Headache, unspecified: R51.9

## 2017-02-10 HISTORY — DX: Heart failure, unspecified: I50.9

## 2017-02-10 HISTORY — DX: Non-ST elevation (NSTEMI) myocardial infarction: I21.4

## 2017-02-10 HISTORY — DX: Chronic kidney disease, stage 3 unspecified: N18.30

## 2017-02-10 LAB — BASIC METABOLIC PANEL
Anion gap: 16 — ABNORMAL HIGH (ref 5–15)
BUN: 50 mg/dL — AB (ref 6–20)
CO2: 29 mmol/L (ref 22–32)
CREATININE: 1.72 mg/dL — AB (ref 0.44–1.00)
Calcium: 9.8 mg/dL (ref 8.9–10.3)
Chloride: 94 mmol/L — ABNORMAL LOW (ref 101–111)
GFR calc Af Amer: 31 mL/min — ABNORMAL LOW (ref >60)
GFR, EST NON AFRICAN AMERICAN: 27 mL/min — AB (ref >60)
Glucose, Bld: 135 mg/dL — ABNORMAL HIGH (ref 65–99)
Potassium: 3.7 mmol/L (ref 3.5–5.1)
SODIUM: 139 mmol/L (ref 135–145)

## 2017-02-10 LAB — CBC WITH DIFFERENTIAL/PLATELET
Basophils Absolute: 0 10*3/uL (ref 0.0–0.1)
Basophils Relative: 1 %
Eosinophils Absolute: 0.1 10*3/uL (ref 0.0–0.7)
Eosinophils Relative: 3 %
HCT: 32.9 % — ABNORMAL LOW (ref 36.0–46.0)
Hemoglobin: 10.8 g/dL — ABNORMAL LOW (ref 12.0–15.0)
Lymphocytes Relative: 13 %
Lymphs Abs: 0.6 10*3/uL — ABNORMAL LOW (ref 0.7–4.0)
MCH: 29 pg (ref 26.0–34.0)
MCHC: 32.8 g/dL (ref 30.0–36.0)
MCV: 88.2 fL (ref 78.0–100.0)
Monocytes Absolute: 0.2 10*3/uL (ref 0.1–1.0)
Monocytes Relative: 4 %
Neutro Abs: 3.8 10*3/uL (ref 1.7–7.7)
Neutrophils Relative %: 79 %
Platelets: 155 10*3/uL (ref 150–400)
RBC: 3.73 MIL/uL — ABNORMAL LOW (ref 3.87–5.11)
RDW: 13.9 % (ref 11.5–15.5)
WBC: 4.8 10*3/uL (ref 4.0–10.5)

## 2017-02-10 LAB — PROTIME-INR
INR: 1
PROTHROMBIN TIME: 13.1 s (ref 11.4–15.2)

## 2017-02-10 LAB — TYPE AND SCREEN
ABO/RH(D): A POS
Antibody Screen: NEGATIVE

## 2017-02-10 MED ORDER — MORPHINE SULFATE (PF) 2 MG/ML IV SOLN
1.0000 mg | INTRAVENOUS | Status: DC | PRN
  Administered 2017-02-10 – 2017-02-12 (×3): 2 mg via INTRAVENOUS
  Filled 2017-02-10 (×2): qty 1

## 2017-02-10 MED ORDER — HYDROCODONE-ACETAMINOPHEN 5-325 MG PO TABS
1.0000 | ORAL_TABLET | Freq: Four times a day (QID) | ORAL | Status: DC | PRN
  Administered 2017-02-11 (×2): 2 via ORAL
  Filled 2017-02-10 (×2): qty 2

## 2017-02-10 MED ORDER — VITAMIN D 1000 UNITS PO TABS
1000.0000 [IU] | ORAL_TABLET | Freq: Every day | ORAL | Status: DC
  Administered 2017-02-11 – 2017-02-17 (×6): 1000 [IU] via ORAL
  Filled 2017-02-10 (×6): qty 1

## 2017-02-10 MED ORDER — FENTANYL CITRATE (PF) 100 MCG/2ML IJ SOLN
50.0000 ug | INTRAMUSCULAR | Status: AC | PRN
  Administered 2017-02-10 (×2): 50 ug via INTRAVENOUS
  Filled 2017-02-10 (×2): qty 2

## 2017-02-10 MED ORDER — PRAVASTATIN SODIUM 40 MG PO TABS
40.0000 mg | ORAL_TABLET | Freq: Every day | ORAL | Status: DC
  Administered 2017-02-11 – 2017-02-16 (×6): 40 mg via ORAL
  Filled 2017-02-10 (×6): qty 1

## 2017-02-10 MED ORDER — FUROSEMIDE 40 MG PO TABS
120.0000 mg | ORAL_TABLET | Freq: Two times a day (BID) | ORAL | Status: DC
  Administered 2017-02-10 – 2017-02-12 (×4): 120 mg via ORAL
  Filled 2017-02-10 (×4): qty 3

## 2017-02-10 MED ORDER — POTASSIUM CHLORIDE CRYS ER 20 MEQ PO TBCR
20.0000 meq | EXTENDED_RELEASE_TABLET | Freq: Two times a day (BID) | ORAL | Status: DC
  Administered 2017-02-11 – 2017-02-14 (×5): 20 meq via ORAL
  Filled 2017-02-10 (×5): qty 1

## 2017-02-10 MED ORDER — METOLAZONE 2.5 MG PO TABS
2.5000 mg | ORAL_TABLET | ORAL | Status: DC
  Administered 2017-02-11: 2.5 mg via ORAL
  Filled 2017-02-10 (×2): qty 1

## 2017-02-10 MED ORDER — SODIUM CHLORIDE 0.9 % IV SOLN
INTRAVENOUS | Status: DC
  Administered 2017-02-10: 10 mL/h via INTRAVENOUS

## 2017-02-10 MED ORDER — AMLODIPINE BESYLATE 2.5 MG PO TABS
2.5000 mg | ORAL_TABLET | Freq: Every day | ORAL | Status: DC
  Administered 2017-02-11 – 2017-02-17 (×6): 2.5 mg via ORAL
  Filled 2017-02-10 (×6): qty 1

## 2017-02-10 MED ORDER — MORPHINE SULFATE (PF) 2 MG/ML IV SOLN
INTRAVENOUS | Status: AC
  Administered 2017-02-10: 2 mg via INTRAVENOUS
  Filled 2017-02-10: qty 1

## 2017-02-10 MED ORDER — IPRATROPIUM BROMIDE 0.02 % IN SOLN
0.5000 mg | Freq: Four times a day (QID) | RESPIRATORY_TRACT | Status: DC | PRN
  Administered 2017-02-10: 0.5 mg via RESPIRATORY_TRACT
  Filled 2017-02-10: qty 2.5

## 2017-02-10 MED ORDER — POLYETHYLENE GLYCOL 3350 17 G PO PACK
17.0000 g | PACK | Freq: Every day | ORAL | Status: DC | PRN
  Administered 2017-02-13: 17 g via ORAL
  Filled 2017-02-10: qty 1

## 2017-02-10 MED ORDER — DONEPEZIL HCL 10 MG PO TABS
10.0000 mg | ORAL_TABLET | Freq: Every day | ORAL | Status: DC
  Administered 2017-02-11 – 2017-02-16 (×7): 10 mg via ORAL
  Filled 2017-02-10 (×7): qty 1

## 2017-02-10 MED ORDER — METOPROLOL TARTRATE 25 MG PO TABS
25.0000 mg | ORAL_TABLET | Freq: Two times a day (BID) | ORAL | Status: DC
  Administered 2017-02-10 – 2017-02-17 (×13): 25 mg via ORAL
  Filled 2017-02-10 (×13): qty 1

## 2017-02-10 MED ORDER — MOMETASONE FURO-FORMOTEROL FUM 200-5 MCG/ACT IN AERO
2.0000 | INHALATION_SPRAY | Freq: Two times a day (BID) | RESPIRATORY_TRACT | Status: DC
  Administered 2017-02-11 – 2017-02-17 (×10): 2 via RESPIRATORY_TRACT
  Filled 2017-02-10: qty 8.8

## 2017-02-10 MED ORDER — ONDANSETRON HCL 4 MG/2ML IJ SOLN
4.0000 mg | Freq: Once | INTRAMUSCULAR | Status: AC
  Administered 2017-02-10: 4 mg via INTRAVENOUS
  Filled 2017-02-10: qty 2

## 2017-02-10 MED ORDER — CLONAZEPAM 0.5 MG PO TABS
0.5000 mg | ORAL_TABLET | Freq: Two times a day (BID) | ORAL | Status: DC
  Administered 2017-02-11 – 2017-02-17 (×13): 0.5 mg via ORAL
  Filled 2017-02-10 (×13): qty 1

## 2017-02-10 MED ORDER — PANTOPRAZOLE SODIUM 40 MG PO TBEC
40.0000 mg | DELAYED_RELEASE_TABLET | Freq: Every day | ORAL | Status: DC
  Administered 2017-02-11 – 2017-02-17 (×6): 40 mg via ORAL
  Filled 2017-02-10 (×6): qty 1

## 2017-02-10 MED ORDER — BUSPIRONE HCL 5 MG PO TABS
15.0000 mg | ORAL_TABLET | Freq: Three times a day (TID) | ORAL | Status: DC
  Administered 2017-02-11 – 2017-02-14 (×10): 15 mg via ORAL
  Filled 2017-02-10 (×3): qty 1
  Filled 2017-02-10: qty 3
  Filled 2017-02-10: qty 1
  Filled 2017-02-10: qty 3
  Filled 2017-02-10 (×3): qty 1
  Filled 2017-02-10: qty 3
  Filled 2017-02-10 (×4): qty 1

## 2017-02-10 MED ORDER — ALBUTEROL SULFATE (2.5 MG/3ML) 0.083% IN NEBU
2.5000 mg | INHALATION_SOLUTION | RESPIRATORY_TRACT | Status: DC | PRN
  Administered 2017-02-10 – 2017-02-12 (×2): 2.5 mg via RESPIRATORY_TRACT
  Filled 2017-02-10: qty 3

## 2017-02-10 MED ORDER — ISOSORBIDE MONONITRATE ER 30 MG PO TB24
15.0000 mg | ORAL_TABLET | Freq: Every day | ORAL | Status: DC
  Administered 2017-02-14 – 2017-02-17 (×4): 15 mg via ORAL
  Filled 2017-02-10 (×6): qty 1

## 2017-02-10 MED ORDER — SERTRALINE HCL 100 MG PO TABS
100.0000 mg | ORAL_TABLET | Freq: Every day | ORAL | Status: DC
  Administered 2017-02-11 – 2017-02-17 (×6): 100 mg via ORAL
  Filled 2017-02-10 (×7): qty 1

## 2017-02-10 MED ORDER — SENNOSIDES-DOCUSATE SODIUM 8.6-50 MG PO TABS: 1.0000 | ORAL_TABLET | Freq: Every evening | ORAL | Status: DC | PRN

## 2017-02-10 NOTE — Progress Notes (Signed)
Dulera not available , will start mdi in am

## 2017-02-10 NOTE — ED Provider Notes (Signed)
[facility] EMERGENCY DEPARTMENT Provider Note   CSN: [id number] Arrival date & time: 02/10/17  1552     History   Chief Complaint Chief Complaint  Patient presents with  . Fall    HPI [name] is a 81 y.o. female.  HPI  The patient is a 81 year old female, she has a known history of coronary disease, known history of COPD and some mild dementia, she has congestive heart failure, she is currently on hospice care.  The patient had a fall at home just prior to arrival where she got tangled up in the oxygen tubing to her oxygen machine and fell striking her left hip on the ground.  This was acute in onset, has caused constant pain which is worse with any movement and she was unable to get off the ground.  She denies associated injuries including head neck or upper extremities.  She has no bleeding and takes only a baby aspirin.  Her family arrives with her and states that she is on hospice care.  Husband was with her when she fell and corroborates her story.  Past Medical History:  Diagnosis Date  . Acute on chronic diastolic heart failure (HCC) 04/11/2014  . Asthma   . Bradycardia   . CAD (coronary artery disease) 08/2009   s/p PCI of the LAD  . Carotid artery stenosis   . Chronic headache   . COPD (chronic obstructive pulmonary disease) (HCC)   . Dementia    patient denies this  . Hyperlipidemia   . Hypertension   . Impaired fasting glucose   . Myocardial infarction (HCC)    Non-st segment elevated  . On home O2   . Osteoporosis 2009  . Pneumonia   . Seasonal allergies   . Ulnar neuropathy     Patient Active Problem List   Diagnosis Date Noted  . Anxiety 02/10/2017  . Chronic anxiety 08/23/2016  . CHF (congestive heart failure) (HCC) 02/05/2016  . Elevated troponin 01/13/2016  . Multiple fractures of ribs, right side, initial encounter for closed fracture 12/03/2015  . Rib fractures 12/03/2015  . Chronic respiratory failure (HCC) 12/03/2015  . COPD  (chronic obstructive pulmonary disease) (HCC) 12/03/2015  . Senile purpura (HCC) 09/07/2015  . Benign essential tremor 09/07/2015  . Chronic diastolic congestive heart failure (HCC) 04/20/2015  . Renal insufficiency 04/25/2014  . Chronic respiratory failure with hypoxia (HCC) 04/11/2014  . CKD (chronic kidney disease) stage 3, GFR 30-59 ml/min (HCC) 04/11/2014  . Prediabetes 12/03/2013  . Hypertrophic obstructive cardiomyopathy (HCC) 11/09/2013  . Hypoxia 08/28/2013  . Respiratory failure with hypoxia (HCC) 05/07/2013  . COPD exacerbation (HCC) 02/15/2013  . Hyperlipidemia 11/09/2010  . CAD (coronary artery disease) 09/18/2009  . BRADYCARDIA 08/19/2009  . Dementia 07/19/2007  . Essential hypertension 07/19/2007  . CAROTID ARTERY STENOSIS 07/19/2007  . ASTHMA 07/19/2007  . COPD with asthma (HCC) 07/19/2007  . HEADACHE, CHRONIC, HX OF 07/19/2007    Past Surgical History:  Procedure Laterality Date  . BLADDER SURGERY  1991  . CARDIAC CATHETERIZATION  11/2010   negative  . CATARACT EXTRACTION  Nov 2016  . LEFT HEART CATHETERIZATION WITH CORONARY ANGIOGRAM N/A 11/05/2012   Procedure: LEFT HEART CATHETERIZATION WITH CORONARY ANGIOGRAM;  Surgeon: Micheline Chapman, MD;  Location: [facility] CATH LAB;  Service: Cardiovascular;  Laterality: N/A;  . PARTIAL HYSTERECTOMY  1984  . Radial artery catheter     Bladder    OB History    Gravida Para Term Preterm AB Living  3   SAB TAB Ectopic Multiple Live Births                   Home Medications    Prior to Admission medications   Medication Sig Start Date End Date Taking? Authorizing Provider  albuterol (PROVENTIL HFA;VENTOLIN HFA) 108 (90 Base) MCG/ACT inhaler Inhale 2 puffs into the lungs every 4 (four) hours as needed for wheezing or shortness of breath. 06/02/16   Babs SciaraLuking, Scott A, MD  albuterol (PROVENTIL) (2.5 MG/3ML) 0.083% nebulizer solution Take 3 mLs (2.5 mg total) by nebulization every 4 (four) hours as needed for  wheezing or shortness of breath. 10/12/15   Babs SciaraLuking, Scott A, MD  amLODipine (NORVASC) 5 MG tablet TAKE 1/2 TABLET BY MOUTH DAILY 07/18/16   Merlyn AlbertLuking, William S, MD  Ascorbic Acid (VITAMIN C PO) Take 1 tablet by mouth daily.    [provider]  budesonide-formoterol (SYMBICORT) 160-4.5 MCG/ACT inhaler Inhale 2 puffs into the lungs 2 (two) times daily. 11/23/16   Babs SciaraLuking, Scott A, MD  busPIRone (BUSPAR) 15 MG tablet Take 1 tablet (15 mg total) by mouth 3 (three) times daily. 08/23/16   Babs SciaraLuking, Scott A, MD  Calcium Carbonate (CALCIUM 600 PO) Take 1 tablet by mouth daily.     [provider]  cefPROZIL (CEFZIL) 250 MG tablet Take 1 tablet (250 mg total) by mouth 2 (two) times daily. 01/20/17   Babs SciaraLuking, Scott A, MD  cetirizine (ZYRTEC) 10 MG tablet Take 10 mg by mouth daily as needed for allergies.     [provider]  Cholecalciferol (VITAMIN D3) 1000 UNITS CAPS Take 1 capsule by mouth daily.    [provider]  clonazePAM (KLONOPIN) 0.5 MG tablet Take 1/2 tablet by mouth in the morning and 1 whole tablet at bedtime 12/30/16   Merlyn AlbertLuking, William S, MD  donepezil (ARICEPT) 10 MG tablet TAKE 1 TABLET BY MOUTH EVERY NIGHT AT BEDTIME 05/02/16   Babs SciaraLuking, Scott A, MD  erythromycin ophthalmic ointment Place 1 application into the right eye 4 (four) times daily. prn 02/02/17   Campbell RichesHoskins, Carolyn C, NP  furosemide (LASIX) 40 MG tablet Take 3 tabs BID 07/15/16   Luking, Lorin PicketScott A, MD  ipratropium (ATROVENT) 0.02 % nebulizer solution USE 1 VIAL VIA NEBULIZER EVERY 6 HOURS AS NEEDED FOR WHEEZING OR SHORTNESS OF BREATH 10/25/16   Babs SciaraLuking, Scott A, MD  isosorbide mononitrate (IMDUR) 30 MG 24 hr tablet TAKE 1/2 TABLET(15 MG) BY MOUTH DAILY 03/11/16   Rosalio MacadamiaGerhardt, Lori C, NP  losartan (COZAAR) 25 MG tablet TAKE 1 TABLET BY MOUTH DAILY 01/16/17   Babs SciaraLuking, Scott A, MD  metolazone (ZAROXOLYN) 2.5 MG tablet Take one tablet by mouth 30 minutes prior to morning dose of Furosemide on Tuesday and Saturday 07/21/16    Tonny Bollmanooper, Michael, MD  metolazone (ZAROXOLYN) 2.5 MG tablet TAKE 1 TABLET BY MOUTH DAILY ON TUESDAYS AND THURSDAYS 09/16/16   Rosalio MacadamiaGerhardt, Lori C, NP  metoprolol tartrate (LOPRESSOR) 25 MG tablet TAKE 1 TABLET BY MOUTH TWICE DAILY 08/11/16   Babs SciaraLuking, Scott A, MD  morphine (ROXANOL) 20 MG/ML concentrated solution Give 0.125 mL (2.5 mg) up to 0.25 mL (5 mg) every four hours as needed. 08/31/16   Merlyn AlbertLuking, William S, MD  nitroGLYCERIN (NITROSTAT) 0.4 MG SL tablet Place 1 tablet (0.4 mg total) under the tongue as needed for chest pain. 12/18/14   Babs SciaraLuking, Scott A, MD  omeprazole (PRILOSEC) 20 MG capsule Take 1 capsule (20 mg total) by mouth daily. 09/06/16  Babs Sciara, MD  ondansetron (ZOFRAN ODT) 8 MG disintegrating tablet Take 1 tablet (8 mg total) by mouth every 8 (eight) hours as needed for nausea or vomiting. 01/18/17   Babs Sciara, MD  PAIN RELIEVER EXTRA STRENGTH 500 MG tablet TAKE ONE TABLET BY MOUTH EVERY 4 HOURS AS NEEDED FOR PAIN 12/30/16   Merlyn Albert, MD  polyethylene glycol [facility] / GLYCOLAX) packet Take 17 g by mouth daily as needed for moderate constipation. 04/16/14   Elliot Cousin, MD  potassium chloride SA (K-DUR,KLOR-CON) 20 MEQ tablet TAKE 1 TABLET(20 MEQ) BY MOUTH TWICE DAILY 01/16/17   Babs Sciara, MD  pravastatin (PRAVACHOL) 40 MG tablet TAKE 1 TABLET BY MOUTH EVERY DAY 05/12/16   Babs Sciara, MD  sertraline (ZOLOFT) 100 MG tablet TAKE 1& 1/2 TABLETS BY MOUTH DAILY 11/16/16   Babs Sciara, MD  Simethicone (GAS RELIEF PO) Take 1 tablet by mouth daily as needed (gas relief).    [provider]    Family History Family History  Problem Relation Age of Onset  . Addison's disease Mother   . Alcohol abuse Father     Social History Social History   Tobacco Use  . Smoking status: Former Smoker    Packs/day: 1.00    Years: 35.00    Pack years: 35.00    Last attempt to quit: 02/15/2008    Years since quitting: 8.9  . Smokeless tobacco: Never Used  Substance  Use Topics  . Alcohol use: No  . Drug use: No     Allergies   Amoxil [amoxicillin]; Codeine; Dilaudid [hydromorphone hcl]; Erythromycin; and Sulfonamide derivatives   Review of Systems Review of Systems  All other systems reviewed and are negative.    Physical Exam Updated Vital Signs BP (!) 179/75   Pulse 72   Temp 99.3 F (37.4 C) (Oral)   Resp 17   Ht 5\' 3"  (1.6 m)   Wt 74.6 kg (164 lb 6 oz)   SpO2 94%   BMI 29.12 kg/m   Physical Exam  Constitutional: She appears well-developed and well-nourished. No distress.  HENT:  Head: Normocephalic and atraumatic.  Mouth/Throat: Oropharynx is clear and moist. No oropharyngeal exudate.  Eyes: Conjunctivae and EOM are normal. Pupils are equal, round, and reactive to light. Right eye exhibits no discharge. Left eye exhibits no discharge. No scleral icterus.  Neck: Normal range of motion. Neck supple. No JVD present. No thyromegaly present.  Cardiovascular: Normal rate, regular rhythm, normal heart sounds and intact distal pulses. Exam reveals no gallop and no friction rub.  No murmur heard. No murmurs, pulses at the bilateral dorsalis pedis which are symmetrical and strong  Pulmonary/Chest: Effort normal and breath sounds normal. No respiratory distress. She has no wheezes. She has no rales.  Abdominal: Soft. Bowel sounds are normal. She exhibits no distension and no mass. There is no tenderness.  Musculoskeletal: She exhibits tenderness and deformity. She exhibits no edema.  Right lower extremity with leg length discrepancy, shortened and externally rotated, pain with any rotation of the hip  Lymphadenopathy:    She has no cervical adenopathy.  Neurological: She is alert. Coordination normal.  Skin: Skin is warm and dry. No rash noted. No erythema.  Psychiatric: She has a normal mood and affect. Her behavior is normal.  Nursing note and vitals reviewed.    ED Treatments / Results  Labs (all labs ordered are listed, but  only abnormal results are displayed) Labs Reviewed  BASIC METABOLIC PANEL - Abnormal; Notable for the following components:      Result Value   Chloride 94 (*)    Glucose, Bld 135 (*)    BUN 50 (*)    Creatinine, Ser 1.72 (*)    GFR calc non Af Amer 27 (*)    GFR calc Af Amer 31 (*)    Anion gap 16 (*)    All other components within normal limits  CBC WITH DIFFERENTIAL/PLATELET - Abnormal; Notable for the following components:   RBC 3.73 (*)    Hemoglobin 10.8 (*)    HCT 32.9 (*)    Lymphs Abs 0.6 (*)    All other components within normal limits  PROTIME-INR  TYPE AND SCREEN    EKG  EKG Interpretation  Date/Time:  Friday February 10 2017 16:41:21 EST Ventricular Rate:  64 PR Interval:    QRS Duration: 103 QT Interval:  474 QTC Calculation: 490 R Axis:   34 Text Interpretation:  Sinus rhythm Abnormal T, consider ischemia, lateral leads Since last tracing T wave inversion still present - no sig changes Confirmed by Eber Hong ([id number]) on 02/10/2017 4:53:10 PM       Radiology Dg Chest 1 View  Result Date: 02/10/2017 CLINICAL DATA:  Fall. Preoperative respiratory evaluation for hip fracture. EXAM: CHEST 1 VIEW COMPARISON:  09/19/2016 FINDINGS: The lungs are clear without focal pneumonia, edema, pneumothorax or pleural effusion. Hyperexpansion suggests emphysema. Interstitial markings are diffusely coarsened with chronic features. The cardio pericardial silhouette is enlarged. Multiple bilateral old rib fractures evident. Telemetry leads overlie the chest. IMPRESSION: Cardiomegaly with emphysema.  No acute cardiopulmonary findings. Electronically Signed   By: Kennith Center M.D.   On: 02/10/2017 18:17   Dg Hip Unilat With Pelvis 2-3 Views Right  Result Date: 02/10/2017 CLINICAL DATA:  Preop.  Fall with right hip pain. EXAM: DG HIP (WITH OR WITHOUT PELVIS) 2-3V RIGHT COMPARISON:  12/16/2015 FINDINGS: AP view the pelvis and AP/frog leg views of the right hip. Comminuted,  impacted subcapital or mid right femoral neck fracture with minimal varus angulation. No dislocation. Sacroiliac joints are symmetric. Support apparatus overlies the left hemipelvis, without gross left-sided abnormality identified. IMPRESSION: Right femoral neck fracture with impaction and mild varus angulation. Electronically Signed   By: Jeronimo Greaves M.D.   On: 02/10/2017 18:21    Procedures Procedures (including critical care time)  Medications Ordered in ED Medications  0.9 %  sodium chloride infusion (10 mL/hr Intravenous New Bag/Given 02/10/17 1707)  fentaNYL (SUBLIMAZE) injection 50 mcg (50 mcg Intravenous Given 02/10/17 1847)  ondansetron (ZOFRAN) injection 4 mg (4 mg Intravenous Given 02/10/17 1723)     Initial Impression / Assessment and Plan / ED Course  I have reviewed the triage vital signs and the nursing notes.  Pertinent labs & imaging results that were available during my care of the patient were reviewed by me and considered in my medical decision making (see chart for details).     Patient has evidence of a right-sided hip fracture, she is certainly complicated by her medical problems and her history of requiring hospice care however at this point the patient will need to have at least a surgical consultation for recommendations on further treatment.  Discussed the care with Dr. Aundria Rud of the orthopedic service who has accepted consultation of the patient, discussed with Dr. Antionette Char of the hospitalist service who will see the patient for admission and transfer.  The family was updated on the patient's condition and  the next clinical steps including transfer and possible surgery over the next several days  Final Clinical Impressions(s) / ED Diagnoses   Final diagnoses:  Closed fracture of right hip, initial encounter [facility])    ED Discharge Orders    None       Eber Hong, MD 02/10/17 [phone]

## 2017-02-10 NOTE — ED Notes (Signed)
Transport to xray

## 2017-02-10 NOTE — H&P (Signed)
History and Physical    SHANIEL SELMON QJJ:[id number] DOB: 14-Sep-1935 DOA: 02/10/2017  PCP: Babs Sciara, MD   Patient coming from: Home  Chief Complaint: Fall with severe right hip pain   HPI: [name] is a 81 y.o. female with medical history significant for emphysema with chronic hypoxic respiratory failure, coronary artery disease, chronic diastolic CHF, anxiety disorder, dementia, and hypertension, now presenting to the emergency department with severe right hip pain after patient reports that she had been in her usual state until tripping over her oxygen tubing, landing on her right side, and experiencing immediate and severe pain at the right hip.  She denies hitting her head or losing consciousness.  No recent fevers or chills.  Family provides additional history: She can ambulate very slowly with a walker at home and becomes short of breath any ambulation at her baseline she dresses herself, but very slowly and with obvious dyspnea.  She takes 120 mg of Lasix twice daily, as well as metolazone twice a week, and family reports that she quickly develops fluid overload when missing even 1 dose.  Patient denies recent chest pain, though her family reports that she complained of chest pain on the morning of her presentation to the hospice nurse, was treated with a nitroglycerin, experienced relief with that.  ED Course: Upon arrival to the ED, patient is found to be afebrile, saturating well on her usual 2 L/min of supplemental oxygen, and with vitals otherwise stable.  EKG features a sinus rhythm with lateral T wave inversions that are unchanged from prior.  Chest x-ray cardiomegaly and emphysema, but no acute finding.  Radiographs of the right hip demonstrate right femoral neck fracture with impaction and mild varus angulation.  Chemistry panel reveals a BUN of 50 and creatinine of 1.72, up from 1.3 a year ago.  CBC is notable for a stable chronic normocytic anemia with hemoglobin  of 10.8.  Patient was treated with fentanyl and Zofran in the ED.  Orthopedic surgery was consulted by the ED physician and recommended a medical admission to [facility] for possible surgical management.  Remains hemodynamically stable and in no acute respiratory distress, and she will be admitted to the medical-surgical unit for ongoing evaluation and management of right femoral neck fracture.  Review of Systems:  All other systems reviewed and apart from HPI, are negative.  Past Medical History:  Diagnosis Date  . Acute on chronic diastolic heart failure (HCC) 04/11/2014  . Asthma   . Bradycardia   . CAD (coronary artery disease) 08/2009   s/p PCI of the LAD  . Carotid artery stenosis   . Chronic headache   . COPD (chronic obstructive pulmonary disease) (HCC)   . Dementia    patient denies this  . Hyperlipidemia   . Hypertension   . Impaired fasting glucose   . Myocardial infarction (HCC)    Non-st segment elevated  . On home O2   . Osteoporosis 2009  . Pneumonia   . Seasonal allergies   . Ulnar neuropathy     Past Surgical History:  Procedure Laterality Date  . BLADDER SURGERY  1991  . CARDIAC CATHETERIZATION  11/2010   negative  . CATARACT EXTRACTION  Nov 2016  . LEFT HEART CATHETERIZATION WITH CORONARY ANGIOGRAM N/A 11/05/2012   Procedure: LEFT HEART CATHETERIZATION WITH CORONARY ANGIOGRAM;  Surgeon: Micheline Chapman, MD;  Location: [facility] CATH LAB;  Service: Cardiovascular;  Laterality: N/A;  . PARTIAL HYSTERECTOMY  1984  .  Radial artery catheter     Bladder     reports that she quit smoking about 8 years ago. She has a 35.00 pack-year smoking history. she has never used smokeless tobacco. She reports that she does not drink alcohol or use drugs.  Allergies  Allergen Reactions  . Amoxil [Amoxicillin]     Thrush each times she takes it  . Codeine Nausea And Vomiting  . Dilaudid [Hydromorphone Hcl] Other (See Comments)    Extremely sensitive  . Erythromycin  Other (See Comments)    Intense stomach pain  . Sulfonamide Derivatives     unknown    Family History  Problem Relation Age of Onset  . Addison's disease Mother   . Alcohol abuse Father      Prior to Admission medications   Medication Sig Start Date End Date Taking? Authorizing Provider  albuterol (PROVENTIL HFA;VENTOLIN HFA) 108 (90 Base) MCG/ACT inhaler Inhale 2 puffs into the lungs every 4 (four) hours as needed for wheezing or shortness of breath. 06/02/16   Babs SciaraLuking, Scott A, MD  albuterol (PROVENTIL) (2.5 MG/3ML) 0.083% nebulizer solution Take 3 mLs (2.5 mg total) by nebulization every 4 (four) hours as needed for wheezing or shortness of breath. 10/12/15   Babs SciaraLuking, Scott A, MD  amLODipine (NORVASC) 5 MG tablet TAKE 1/2 TABLET BY MOUTH DAILY 07/18/16   Merlyn AlbertLuking, William S, MD  Ascorbic Acid (VITAMIN C PO) Take 1 tablet by mouth daily.    [provider]  budesonide-formoterol (SYMBICORT) 160-4.5 MCG/ACT inhaler Inhale 2 puffs into the lungs 2 (two) times daily. 11/23/16   Babs SciaraLuking, Scott A, MD  busPIRone (BUSPAR) 15 MG tablet Take 1 tablet (15 mg total) by mouth 3 (three) times daily. 08/23/16   Babs SciaraLuking, Scott A, MD  Calcium Carbonate (CALCIUM 600 PO) Take 1 tablet by mouth daily.     [provider]  cefPROZIL (CEFZIL) 250 MG tablet Take 1 tablet (250 mg total) by mouth 2 (two) times daily. 01/20/17   Babs SciaraLuking, Scott A, MD  cetirizine (ZYRTEC) 10 MG tablet Take 10 mg by mouth daily as needed for allergies.     [provider]  Cholecalciferol (VITAMIN D3) 1000 UNITS CAPS Take 1 capsule by mouth daily.    [provider]  clonazePAM (KLONOPIN) 0.5 MG tablet Take 1/2 tablet by mouth in the morning and 1 whole tablet at bedtime 12/30/16   Merlyn AlbertLuking, William S, MD  donepezil (ARICEPT) 10 MG tablet TAKE 1 TABLET BY MOUTH EVERY NIGHT AT BEDTIME 05/02/16   Babs SciaraLuking, Scott A, MD  erythromycin ophthalmic ointment Place 1 application into the right eye 4 (four) times daily. prn  02/02/17   Campbell RichesHoskins, Carolyn C, NP  furosemide (LASIX) 40 MG tablet Take 3 tabs BID 07/15/16   Luking, Lorin PicketScott A, MD  ipratropium (ATROVENT) 0.02 % nebulizer solution USE 1 VIAL VIA NEBULIZER EVERY 6 HOURS AS NEEDED FOR WHEEZING OR SHORTNESS OF BREATH 10/25/16   Babs SciaraLuking, Scott A, MD  isosorbide mononitrate (IMDUR) 30 MG 24 hr tablet TAKE 1/2 TABLET(15 MG) BY MOUTH DAILY 03/11/16   Rosalio MacadamiaGerhardt, Lori C, NP  losartan (COZAAR) 25 MG tablet TAKE 1 TABLET BY MOUTH DAILY 01/16/17   Babs SciaraLuking, Scott A, MD  metolazone (ZAROXOLYN) 2.5 MG tablet Take one tablet by mouth 30 minutes prior to morning dose of Furosemide on Tuesday and Saturday 07/21/16   Tonny Bollmanooper, Michael, MD  metolazone (ZAROXOLYN) 2.5 MG tablet TAKE 1 TABLET BY MOUTH DAILY ON TUESDAYS AND THURSDAYS 09/16/16   Norma FredricksonGerhardt, Lori  C, NP  metoprolol tartrate (LOPRESSOR) 25 MG tablet TAKE 1 TABLET BY MOUTH TWICE DAILY 08/11/16   Babs Sciara, MD  morphine (ROXANOL) 20 MG/ML concentrated solution Give 0.125 mL (2.5 mg) up to 0.25 mL (5 mg) every four hours as needed. 08/31/16   Merlyn Albert, MD  nitroGLYCERIN (NITROSTAT) 0.4 MG SL tablet Place 1 tablet (0.4 mg total) under the tongue as needed for chest pain. 12/18/14   Babs Sciara, MD  omeprazole (PRILOSEC) 20 MG capsule Take 1 capsule (20 mg total) by mouth daily. 09/06/16   Babs Sciara, MD  ondansetron (ZOFRAN ODT) 8 MG disintegrating tablet Take 1 tablet (8 mg total) by mouth every 8 (eight) hours as needed for nausea or vomiting. 01/18/17   Babs Sciara, MD  PAIN RELIEVER EXTRA STRENGTH 500 MG tablet TAKE ONE TABLET BY MOUTH EVERY 4 HOURS AS NEEDED FOR PAIN 12/30/16   Merlyn Albert, MD  polyethylene glycol [facility] / GLYCOLAX) packet Take 17 g by mouth daily as needed for moderate constipation. 04/16/14   Elliot Cousin, MD  potassium chloride SA (K-DUR,KLOR-CON) 20 MEQ tablet TAKE 1 TABLET(20 MEQ) BY MOUTH TWICE DAILY 01/16/17   Babs Sciara, MD  pravastatin (PRAVACHOL) 40 MG tablet TAKE 1 TABLET BY  MOUTH EVERY DAY 05/12/16   Babs Sciara, MD  sertraline (ZOLOFT) 100 MG tablet TAKE 1& 1/2 TABLETS BY MOUTH DAILY 11/16/16   Babs Sciara, MD  Simethicone (GAS RELIEF PO) Take 1 tablet by mouth daily as needed (gas relief).    [provider]    Physical Exam: Vitals:   02/10/17 1730 02/10/17 1800 02/10/17 1830 02/10/17 1901  BP: (!) 164/69 (!) 192/70 (!) 164/63 (!) 179/75  Pulse: 62 72 71 72  Resp: 14 15 19 17   Temp:      TempSrc:      SpO2: 96% 92% 98% 94%  Weight:      Height:          Constitutional: NAD, calm, comfortable Eyes: PERTLA, lids and conjunctivae normal ENMT: Mucous membranes are moist. Posterior pharynx clear of any exudate or lesions.   Neck: normal, supple, no masses, no thyromegaly Respiratory: Breath sounds diminished bilaterally. No wheeze, rhonchi, or crackles. No accessory muscle use.  Cardiovascular: S1 & S2 heard, regular rate and rhythm. No significant JVD. Abdomen: No distension, no tenderness, no masses palpated. Bowel sounds normal.  Musculoskeletal: no clubbing / cyanosis. Right hip tender; good cap refill, intact sensation, and intact motor function distally.   Skin: no significant rashes, lesions, ulcers. Warm, dry, well-perfused. Neurologic: CN 2-12 grossly intact. Sensation intact. Strength 5/5 in all 4 limbs.  Psychiatric: Alert and oriented x 3. Pleasant and coopeartive.     Labs on Admission: I have personally reviewed following labs and imaging studies  CBC: Recent Labs  Lab 02/10/17 1615  WBC 4.8  NEUTROABS 3.8  HGB 10.8*  HCT 32.9*  MCV 88.2  PLT 155   Basic Metabolic Panel: Recent Labs  Lab 02/10/17 1615  NA 139  K 3.7  CL 94*  CO2 29  GLUCOSE 135*  BUN 50*  CREATININE 1.72*  CALCIUM 9.8   GFR: Estimated Creatinine Clearance: 25.7 mL/min (A) (by C-G formula based on SCr of 1.72 mg/dL (H)). Liver Function Tests: No results for input(s): AST, ALT, ALKPHOS, BILITOT, PROT, ALBUMIN in the last 168  hours. No results for input(s): LIPASE, AMYLASE in the last 168 hours. No results for input(s): AMMONIA in  the last 168 hours. Coagulation Profile: Recent Labs  Lab 02/10/17 1615  INR 1.00   Cardiac Enzymes: No results for input(s): CKTOTAL, CKMB, CKMBINDEX, TROPONINI in the last 168 hours. BNP (last 3 results) Recent Labs    02/19/16 1121  PROBNP 7,074*   HbA1C: No results for input(s): HGBA1C in the last 72 hours. CBG: No results for input(s): GLUCAP in the last 168 hours. Lipid Profile: No results for input(s): CHOL, HDL, LDLCALC, TRIG, CHOLHDL, LDLDIRECT in the last 72 hours. Thyroid Function Tests: No results for input(s): TSH, T4TOTAL, FREET4, T3FREE, THYROIDAB in the last 72 hours. Anemia Panel: No results for input(s): VITAMINB12, FOLATE, FERRITIN, TIBC, IRON, RETICCTPCT in the last 72 hours. Urine analysis:    Component Value Date/Time   COLORURINE YELLOW 12/03/2015 1010   APPEARANCEUR CLEAR 12/03/2015 1010   LABSPEC 1.025 12/03/2015 1010   PHURINE 6.0 12/03/2015 1010   GLUCOSEU NEGATIVE 12/03/2015 1010   HGBUR NEGATIVE 12/03/2015 1010   BILIRUBINUR NEGATIVE 12/03/2015 1010   KETONESUR NEGATIVE 12/03/2015 1010   PROTEINUR NEGATIVE 12/03/2015 1010   UROBILINOGEN 0.2 04/13/2014 1200   NITRITE NEGATIVE 12/03/2015 1010   LEUKOCYTESUR NEGATIVE 12/03/2015 1010   Sepsis Labs: @LABRCNTIP (procalcitonin:4,lacticidven:4) )No results found for this or any previous visit (from the past 240 hour(s)).   Radiological Exams on Admission: Dg Chest 1 View  Result Date: 02/10/2017 CLINICAL DATA:  Fall. Preoperative respiratory evaluation for hip fracture. EXAM: CHEST 1 VIEW COMPARISON:  09/19/2016 FINDINGS: The lungs are clear without focal pneumonia, edema, pneumothorax or pleural effusion. Hyperexpansion suggests emphysema. Interstitial markings are diffusely coarsened with chronic features. The cardio pericardial silhouette is enlarged. Multiple bilateral old rib fractures  evident. Telemetry leads overlie the chest. IMPRESSION: Cardiomegaly with emphysema.  No acute cardiopulmonary findings. Electronically Signed   By: Kennith Center M.D.   On: 02/10/2017 18:17   Dg Hip Unilat With Pelvis 2-3 Views Right  Result Date: 02/10/2017 CLINICAL DATA:  Preop.  Fall with right hip pain. EXAM: DG HIP (WITH OR WITHOUT PELVIS) 2-3V RIGHT COMPARISON:  12/16/2015 FINDINGS: AP view the pelvis and AP/frog leg views of the right hip. Comminuted, impacted subcapital or mid right femoral neck fracture with minimal varus angulation. No dislocation. Sacroiliac joints are symmetric. Support apparatus overlies the left hemipelvis, without gross left-sided abnormality identified. IMPRESSION: Right femoral neck fracture with impaction and mild varus angulation. Electronically Signed   By: Jeronimo Greaves M.D.   On: 02/10/2017 18:21    EKG: Independently reviewed. Sinus rhythm, lateral T-wave inversions. No significant change from prior.    Assessment/Plan  1. Right hip fracture  - Presents with severe right hip pain after a trip and fall at home  - Radiographs confirm right femoral neck fracture  - Orthopedic surgery is consulting; recommend transfer to [facility] for possible surgical management  - Based on the available data, Ms. Prickett presents a ~5.5% estimated risk probability for perioperative MI or cardiac arrest per Nolon Nations al  - Keep NPO after midnight, continue prn analgesia, hold ARB, follow fluid status with I/O's and daily wts, use hydralazine IVP's for BP control    2. COPD; chronic hypoxic respiratory failure  - Treated for exacerbation earlier this month  - No wheezing or cough on admission  - Family reports that at baseline, she is dyspneic with dressing herself and has to stop and rest during the process  - Continue ICS/LABA, prn nebs    3. Chronic diastolic CHF  - Appears euvolemic on admission - TTE (  08/10/15) with EF 65-70%, mod-sev LVH, mild MR, diastolic function not  determined  - Managed at home with Lasix 120 mg BID, metolazone, beta-blocker, and ARB  - Continue diuretics and beta-blocker, hold ARB for possible surgery  - Follow daily wts and I/O's   4. CAD  - Per family, she reported chest pain to hospice nurse the am of 12/28, was treated with NTG x1, and experienced relief - Continue beta-blocker and statin; hold ARB for possible surgery   5. CKD stage III  - SCr is 1.72 on admission, up from priors in 1.3-range, though most recent chem panel is from 1 year ago  - Appears euvolemic on admission  - Hold ARB as above, follow fluid-status closely    6. Anxiety  - Stable, continue Buspar, Zoloft, Klonopin    7. Dementia  - Stable, continue Aricept    8. Anemia  - Hgb is 10.8 on admission  - No bleeding evident, H&H stable from priors  - T&S done     DVT prophylaxis: SCD's  Code Status: DNR Family Communication: Husband, daughter, and granddaughter updated at bedside Disposition Plan: Admit to med-surg  Consults called: Orthopedic surgery Admission status: Inpatient    Briscoe Deutscher, MD [facility] Pager [phone]  If 7PM-7AM, please contact night-coverage [email or web address] Password [facility]  02/10/2017, 7:27 PM

## 2017-02-10 NOTE — ED Triage Notes (Signed)
PT stated she tripped over her oxygen tubing this evening and fell on her right hip. PT c/o right hip pain but has good distal pulses and sensation intact. PT right leg externally rotated in appearance.

## 2017-02-11 DIAGNOSIS — G250 Essential tremor: Secondary | ICD-10-CM

## 2017-02-11 DIAGNOSIS — E876 Hypokalemia: Secondary | ICD-10-CM

## 2017-02-11 LAB — CBC WITH DIFFERENTIAL/PLATELET
BASOS ABS: 0 10*3/uL (ref 0.0–0.1)
Basophils Relative: 0 %
EOS ABS: 0.3 10*3/uL (ref 0.0–0.7)
Eosinophils Relative: 5 %
HCT: 30.9 % — ABNORMAL LOW (ref 36.0–46.0)
HEMOGLOBIN: 9.8 g/dL — AB (ref 12.0–15.0)
LYMPHS ABS: 0.6 10*3/uL — AB (ref 0.7–4.0)
Lymphocytes Relative: 9 %
MCH: 27.8 pg (ref 26.0–34.0)
MCHC: 31.7 g/dL (ref 30.0–36.0)
MCV: 87.8 fL (ref 78.0–100.0)
Monocytes Absolute: 0.3 10*3/uL (ref 0.1–1.0)
Monocytes Relative: 6 %
NEUTROS PCT: 80 %
Neutro Abs: 4.8 10*3/uL (ref 1.7–7.7)
Platelets: 135 10*3/uL — ABNORMAL LOW (ref 150–400)
RBC: 3.52 MIL/uL — AB (ref 3.87–5.11)
RDW: 14.1 % (ref 11.5–15.5)
WBC: 6.1 10*3/uL (ref 4.0–10.5)

## 2017-02-11 LAB — COMPREHENSIVE METABOLIC PANEL
ALBUMIN: 3.9 g/dL (ref 3.5–5.0)
ALK PHOS: 37 U/L — AB (ref 38–126)
ALT: 15 U/L (ref 14–54)
AST: 20 U/L (ref 15–41)
Anion gap: 11 (ref 5–15)
BUN: 38 mg/dL — AB (ref 6–20)
CALCIUM: 8.7 mg/dL — AB (ref 8.9–10.3)
CHLORIDE: 94 mmol/L — AB (ref 101–111)
CO2: 32 mmol/L (ref 22–32)
CREATININE: 1.67 mg/dL — AB (ref 0.44–1.00)
GFR calc non Af Amer: 28 mL/min — ABNORMAL LOW (ref >60)
GFR, EST AFRICAN AMERICAN: 33 mL/min — AB (ref >60)
GLUCOSE: 156 mg/dL — AB (ref 65–99)
Potassium: 2.8 mmol/L — ABNORMAL LOW (ref 3.5–5.1)
SODIUM: 137 mmol/L (ref 135–145)
Total Bilirubin: 0.7 mg/dL (ref 0.3–1.2)
Total Protein: 6.5 g/dL (ref 6.5–8.1)

## 2017-02-11 LAB — SURGICAL PCR SCREEN
MRSA, PCR: NEGATIVE
STAPHYLOCOCCUS AUREUS: NEGATIVE

## 2017-02-11 LAB — MAGNESIUM: Magnesium: 1.9 mg/dL (ref 1.7–2.4)

## 2017-02-11 LAB — TYPE AND SCREEN
ABO/RH(D): A POS
Antibody Screen: NEGATIVE

## 2017-02-11 LAB — PHOSPHORUS: Phosphorus: 3.7 mg/dL (ref 2.5–4.6)

## 2017-02-11 LAB — ABO/RH: ABO/RH(D): A POS

## 2017-02-11 MED ORDER — POTASSIUM CHLORIDE CRYS ER 20 MEQ PO TBCR
40.0000 meq | EXTENDED_RELEASE_TABLET | Freq: Two times a day (BID) | ORAL | Status: AC
  Administered 2017-02-11 (×2): 40 meq via ORAL
  Filled 2017-02-11 (×2): qty 2

## 2017-02-11 MED ORDER — ENSURE ENLIVE PO LIQD
237.0000 mL | Freq: Every day | ORAL | Status: DC
  Administered 2017-02-13 – 2017-02-15 (×2): 237 mL via ORAL

## 2017-02-11 MED ORDER — POVIDONE-IODINE 10 % EX SWAB: 2.0000 "application " | Freq: Once | CUTANEOUS | Status: DC

## 2017-02-11 MED ORDER — TRANEXAMIC ACID 1000 MG/10ML IV SOLN
1000.0000 mg | INTRAVENOUS | Status: AC
  Administered 2017-02-12: 1000 mg via INTRAVENOUS
  Filled 2017-02-11: qty 10

## 2017-02-11 MED ORDER — CEFAZOLIN SODIUM-DEXTROSE 2-4 GM/100ML-% IV SOLN
2.0000 g | INTRAVENOUS | Status: DC
  Filled 2017-02-11: qty 100

## 2017-02-11 MED ORDER — SODIUM CHLORIDE 0.9 % IV SOLN
1000.0000 mg | INTRAVENOUS | Status: DC
  Filled 2017-02-11: qty 10

## 2017-02-11 MED ORDER — HYDROCODONE-ACETAMINOPHEN 5-325 MG PO TABS
1.0000 | ORAL_TABLET | ORAL | Status: DC | PRN
  Administered 2017-02-11: 2 via ORAL
  Filled 2017-02-11: qty 2

## 2017-02-11 MED ORDER — NITROGLYCERIN 0.4 MG SL SUBL: 0.4000 mg | SUBLINGUAL_TABLET | SUBLINGUAL | Status: DC | PRN

## 2017-02-11 NOTE — Consult Note (Signed)
Reason for Consult: Right hip fracture Referring Physician: Dr.Sheikh  [name] is an 81 y.o. female.  HPI: Patient was brought via EMS to [facility] for a Right hip fracture. Complicated medical history. She was at home and trip over O2 tubing. Her fall occurred on 02/10/17. Denies syncope, SOB,or CP at that time. She still reports right groin pain. No other pain or trauma.  Family is at the bedside. She lives at home with family.   Past Medical History:  Diagnosis Date  . Acute on chronic diastolic heart failure ([place]) 04/11/2014  . Asthma   . Bradycardia   . CAD (coronary artery disease) 08/2009   s/p PCI of the LAD  . Carotid artery stenosis   . Chronic headache   . COPD (chronic obstructive pulmonary disease) ([place])   . Dementia    patient denies this  . Hyperlipidemia   . Hypertension   . Impaired fasting glucose   . Myocardial infarction ([place])    Non-st segment elevated  . On home O2   . Osteoporosis 2009  . Pneumonia   . Seasonal allergies   . Ulnar neuropathy     Past Surgical History:  Procedure Laterality Date  . BLADDER SURGERY  1991  . CARDIAC CATHETERIZATION  11/2010   negative  . CATARACT EXTRACTION  Nov 2016  . LEFT HEART CATHETERIZATION WITH CORONARY ANGIOGRAM N/A 11/05/2012   Procedure: LEFT HEART CATHETERIZATION WITH CORONARY ANGIOGRAM;  Surgeon: Blane Ohara, MD;  Location: [facility] CATH LAB;  Service: Cardiovascular;  Laterality: N/A;  . PARTIAL HYSTERECTOMY  1984  . Radial artery catheter     Bladder    Family History  Problem Relation Age of Onset  . Addison's disease Mother   . Alcohol abuse Father     Social History:  reports that she quit smoking about 8 years ago. She has a 35.00 pack-year smoking history. she has never used smokeless tobacco. She reports that she does not drink alcohol or use drugs.  Allergies:  Allergies  Allergen Reactions  . Amoxil [Amoxicillin]     Thrush each times she takes it  . Codeine Nausea And Vomiting  .  Dilaudid [Hydromorphone Hcl] Other (See Comments)    Extremely sensitive  . Erythromycin Other (See Comments)    Intense stomach pain  . Sulfonamide Derivatives     unknown    Medications: I have reviewed the patient's current medications.  Results for orders placed or performed during the hospital encounter of 02/10/17 (from the past 48 hour(s))  Basic metabolic panel     Status: Abnormal   Collection Time: 02/10/17  4:15 PM  Result Value Ref Range   Sodium 139 135 - 145 mmol/L   Potassium 3.7 3.5 - 5.1 mmol/L   Chloride 94 (L) 101 - 111 mmol/L   CO2 29 22 - 32 mmol/L   Glucose, Bld 135 (H) 65 - 99 mg/dL   BUN 50 (H) 6 - 20 mg/dL   Creatinine, Ser 1.72 (H) 0.44 - 1.00 mg/dL   Calcium 9.8 8.9 - 10.3 mg/dL   GFR calc non Af Amer 27 (L) >60 mL/min   GFR calc Af Amer 31 (L) >60 mL/min    Comment: (NOTE) The eGFR has been calculated using the CKD EPI equation. This calculation has not been validated in all clinical situations. eGFR's persistently <60 mL/min signify possible Chronic Kidney Disease.    Anion gap 16 (H) 5 - 15  CBC WITH DIFFERENTIAL  Status: Abnormal   Collection Time: 02/10/17  4:15 PM  Result Value Ref Range   WBC 4.8 4.0 - 10.5 K/uL   RBC 3.73 (L) 3.87 - 5.11 MIL/uL   Hemoglobin 10.8 (L) 12.0 - 15.0 g/dL   HCT 32.9 (L) 36.0 - 46.0 %   MCV 88.2 78.0 - 100.0 fL   MCH 29.0 26.0 - 34.0 pg   MCHC 32.8 30.0 - 36.0 g/dL   RDW 13.9 11.5 - 15.5 %   Platelets 155 150 - 400 K/uL   Neutrophils Relative % 79 %   Neutro Abs 3.8 1.7 - 7.7 K/uL   Lymphocytes Relative 13 %   Lymphs Abs 0.6 (L) 0.7 - 4.0 K/uL   Monocytes Relative 4 %   Monocytes Absolute 0.2 0.1 - 1.0 K/uL   Eosinophils Relative 3 %   Eosinophils Absolute 0.1 0.0 - 0.7 K/uL   Basophils Relative 1 %   Basophils Absolute 0.0 0.0 - 0.1 K/uL  Protime-INR     Status: None   Collection Time: 02/10/17  4:15 PM  Result Value Ref Range   Prothrombin Time 13.1 11.4 - 15.2 seconds   INR 1.00   Type and  screen [facility]     Status: None   Collection Time: 02/10/17  4:15 PM  Result Value Ref Range   ABO/RH(D) A POS    Antibody Screen NEG    Sample Expiration 02/13/2017   Surgical pcr screen     Status: None   Collection Time: 02/11/17  1:04 AM  Result Value Ref Range   MRSA, PCR NEGATIVE NEGATIVE   Staphylococcus aureus NEGATIVE NEGATIVE    Comment: (NOTE) The Xpert SA Assay (FDA approved for NASAL specimens in patients 98 years of age and older), is one component of a comprehensive surveillance program. It is not intended to diagnose infection nor to guide or monitor treatment.     Dg Chest 1 View  Result Date: 02/10/2017 CLINICAL DATA:  Fall. Preoperative respiratory evaluation for hip fracture. EXAM: CHEST 1 VIEW COMPARISON:  09/19/2016 FINDINGS: The lungs are clear without focal pneumonia, edema, pneumothorax or pleural effusion. Hyperexpansion suggests emphysema. Interstitial markings are diffusely coarsened with chronic features. The cardio pericardial silhouette is enlarged. Multiple bilateral old rib fractures evident. Telemetry leads overlie the chest. IMPRESSION: Cardiomegaly with emphysema.  No acute cardiopulmonary findings. Electronically Signed   By: Misty Stanley M.D.   On: 02/10/2017 18:17   Dg Hip Unilat With Pelvis 2-3 Views Right  Result Date: 02/10/2017 CLINICAL DATA:  Preop.  Fall with right hip pain. EXAM: DG HIP (WITH OR WITHOUT PELVIS) 2-3V RIGHT COMPARISON:  12/16/2015 FINDINGS: AP view the pelvis and AP/frog leg views of the right hip. Comminuted, impacted subcapital or mid right femoral neck fracture with minimal varus angulation. No dislocation. Sacroiliac joints are symmetric. Support apparatus overlies the left hemipelvis, without gross left-sided abnormality identified. IMPRESSION: Right femoral neck fracture with impaction and mild varus angulation. Electronically Signed   By: Abigail Miyamoto M.D.   On: 02/10/2017 18:21    Review of Systems   Constitutional: Negative.   HENT: Negative.   Eyes: Negative.   Respiratory: Negative.   Cardiovascular: Negative for chest pain and leg swelling.  Gastrointestinal: Negative.   Genitourinary: Negative.   Musculoskeletal: Positive for falls and joint pain.  Skin: Negative.   Neurological: Negative.   Endo/Heme/Allergies: Negative.   Psychiatric/Behavioral: Positive for memory loss.   Blood pressure (!) 153/53, pulse (!) 59, temperature 98.4 F (  36.9 C), temperature source Oral, resp. rate 20, height 5' 3"  (1.6 m), weight 79.4 kg (175 lb 0.7 oz), SpO2 95 %. Physical Exam  Constitutional: She appears well-developed.  HENT:  Head: Normocephalic.  Eyes: EOM are normal.  Neck: Normal range of motion.  Cardiovascular: Normal rate and intact distal pulses.  Respiratory: Effort normal.  GI: Soft.  Genitourinary:  Genitourinary Comments: deferred  Musculoskeletal:  Right hip tenderness. Limited ROM. Calf soft and non tender. 2+ pedal pulse. No knee effusion.   Neurological: She is alert.  Skin: Skin is warm and dry.  Psychiatric: Her behavior is normal.    Assessment/Plan: Right hip fracture: Diet at this time Bedrest  Pain management Dr.Rogers to see later today Plan for surgery tomorrow vs Monday On medicine service Lajean Manes 02/11/2017, 9:56 AM

## 2017-02-11 NOTE — Progress Notes (Signed)
I have discussed case with Dr. Aundria Rud and I will be performing surgery tomorrow morning.  I will assume orthopedic care afterwards.    Mayra Reel, MD [facility] [phone] 11:55 AM

## 2017-02-11 NOTE — Progress Notes (Signed)
Orthopedic Tech Progress Note Patient Details:  [name] 14-Jun-1935 [id number]  Ortho Devices Ortho Device/Splint Location: Trapeze bar Ortho Device/Splint Interventions: Application   Post Interventions Instructions Provided: Care of device, Adjustment of device   Saul Fordyce 02/11/2017, 6:23 PM

## 2017-02-11 NOTE — Progress Notes (Signed)
PROGRESS NOTE    [name]  ZOX:[id number] DOB: October 01, 1935 DOA: 02/10/2017 PCP: Babs Sciara, MD   Brief Narrative:  [name] is a 81 y.o. female with medical history significant for emphysema with chronic hypoxic respiratory failure, coronary artery disease, chronic diastolic CHF, anxiety disorder, dementia, and hypertension, and other comorbids now presenting to the emergency department with severe right hip pain after patient reports that she had been in her usual state until tripping over her oxygen tubing, landing on her right side, and experiencing immediate and severe pain at the right hip.  She denies hitting her head or losing consciousness.  No recent fevers or chills.  Family provided additional history: She can ambulate very slowly with a walker at home and becomes short of breath any ambulation at her baseline she dresses herself, but very slowly and with obvious dyspnea.  She takes 120 mg of Lasix twice daily, as well as metolazone twice a week, and family reports that she quickly develops fluid overload when missing even 1 dose.  Patient denies recent chest pain, though her family reports that she complained of chest pain on the morning of her presentation to the hospice nurse, was treated with a nitroglycerin, experienced relief with that.  Upon arrival to the ED, Radiographs of the right hip demonstrate right femoral neck fracture with impaction and mild varus angulation. Patient was treated with Fentanyl and Zofran in the ED.  Orthopedic surgery was consulted by the ED physician and recommended a medical admission to [facility] for possible surgical management. She was admitted to the medical-surgical unit for ongoing evaluation and management of right femoral neck fracture. Orthopedic Surgery evaluated her and she is to undergo Right Hemiarthroplasty of the Right Hip in AM.   Assessment & Plan:   Principal Problem:   Closed right hip fracture, initial  encounter [facility]) Active Problems:   Dementia   CAD (coronary artery disease)   Chronic respiratory failure with hypoxia (HCC)   CKD (chronic kidney disease) stage 3, GFR 30-59 ml/min (HCC)   Chronic diastolic congestive heart failure (HCC)   Benign essential tremor   COPD (chronic obstructive pulmonary disease) (HCC)   Anxiety  Right Hip Fracture   -Presented with severe right hip pain after a trip and fall at home  -Radiographs confirm right femoral neck fracture  -Orthopedic Surgery Consulted and recommended Transfer to [facility] -Based on the available data, Ms. [name] presents a ~5.5% estimated risk probability for perioperative MI or cardiac arrest per Nolon Nations al  -Patient to go to Surgery in AM so can give Diet -C/w Bedrest  -NPO at Midnight -Follow Volume Status and Strict I's/O's -Pain Control with Hydrocodone-Acetaminophen 1-2 tab po q6hprn Moderate Pain and IV Morphine 1-2 mg q2hprn Severe Pain -Continue to Monitor   COPD; Chronic Respiratory Failure with Hypoxia  -Treated for exacerbation earlier this month  -No wheezing or cough on admission  -Family reports that at baseline, she is dyspneic with dressing herself and has to stop and rest during the process  -C/w Albuterol 2.5 mg Neb q4hprn Wheezing and SOB, and C/w Ipratropium 0.5 mg Neb q6hprn -C/w Dulera 2 puff RTBID -CXR on Admission showed The lungs are clear without focal pneumonia, edema, pneumothorax or pleural effusion. Hyperexpansion suggests emphysema. Interstitial markings are diffusely coarsened with chronic features. The cardio pericardial silhouette is enlarged. Multiple bilateral old rib fractures evident  Chronic Diastolic CHF  - Appears euvolemic on admission and currently not in Exacerbation  -  TTE (08/10/15) with EF 65-70%, mod-sev LVH, mild MR, diastolic function not determined  - Managed at Home with Lasix 120 mg BID, Metolazone 2.5 mg po (Sat and Tuesday), Metoprolol 25 mg po BID, and ARB  -  Continue Diuretics and Beta-blocker, holding ARB for possible surgery  -C/w Potassium Repletion with 20 mEQ po BID  -Strict I's/O's, Daily Weights, SLIV, and Fluid Restriction -I's/O's not recorded so will have to reinforce to get done -Weight is up 11 lbs but ? Accuracy -Continue to Monitor Volume Status   CAD  - Per family, She reported chest pain to hospice nurse the am of 12/28, was treated with NTG x1, and experienced relief - Continue Metoprolol 25 mg po BID and Pravastatin 40 mg po Daily; hold ARB Surgery  -Added back NTG 0.4 mg SL PRN CP and C/w Isosorbide Mononitrate 15 mg po Daily   CKD stage III  - SCr is 1.72 on admission, up from priors in 1.3-range, though most recent chem panel is from 1 year ago  - Appears euvolemic on admission  - Hold ARB as above, follow fluid-status closely - BUN/Cr went from 50/1.72 -> 38/1.67 - Repeat CMP in AM   Anxiety  - Stable.  -Continue Buspirone 15 mg po TID and Clonazepam 0.5 mg po BID    Dementia  - Stable. C/w Donepezil 10 mg po qHS  Normocytic Anemia  - Hgb is 10.8 on admission  - No S/Sx of Bleeding; Hb/Hct went from 10.8/32.9 -> 9.8/30.9 - Patient is Typed and Screened  - Continue to Monitor and repeat CBC in AM   GERD -C/w Pantoprazole 40 mg po Daily   Essential Hypertension -C/w Amlodipine 2.5 mg po Daily, Metoprolol 25 mg po BID, and Furosemide 120 mg po BID -Holding ACEi/ARB for Surgery  Hypokalemia -Patient's K+ was 2.8 -Repeat with po KCl 40 mEQ BID x2 doses and Home 20 mEQ pO BID -Continue to Monitor and Replete as Necessary -Repeat CMP In AM   DVT prophylaxis: SCD's; Defer to Ortho Service to start DVT Prophylaxis  Code Status: FULL CODE Family Communication: Discussed with family at bedside Disposition Plan: Remain Inpatient for Right Hip Hemiarthroplasty in AM  Consultants:   Orthopedic Surgery Dr. Nicholos Johns   Procedures:  None   Antimicrobials:  Anti-infectives (From admission, onward)    None     Subjective: Seen and examined at bedside and had no complaints and denied any pain. SOB at baseline. No nausea or vomiting. Understands she is to undergo surgery in AM.  Objective: Vitals:   02/11/17 0140 02/11/17 0419 02/11/17 0700 02/11/17 0932  BP:  (!) 136/44 (!) 153/53   Pulse:  65 (!) 59   Resp:  (!) 22 20   Temp:  99.3 F (37.4 C) 98.4 F (36.9 C)   TempSrc:  Oral Oral   SpO2:  90% 93% 95%  Weight: 79.4 kg (175 lb 0.7 oz)     Height:       No intake or output data in the 24 hours ending 02/11/17 1550 Filed Weights   02/10/17 1603 02/11/17 0140  Weight: 74.6 kg (164 lb 6 oz) 79.4 kg (175 lb 0.7 oz)   Examination: Physical Exam:  Constitutional: WN/WD obese Cacuasian female in NAD and appears calm  Eyes: Lids and conjunctivae normal, sclerae anicteric  ENMT: External Ears, Nose appear normal. Grossly normal hearing. Mucous membranes are moist. Neck: Appears normal, supple, no cervical masses, normal ROM, no appreciable thyromegaly; no appreciable JVD Respiratory: Diminished  to auscultation bilaterally, no appreciable wheezing, rales, rhonchi or crackles. Normal respiratory effort and patient is not tachypenic. No accessory muscle use.  Cardiovascular: RRR, no murmurs / rubs / gallops. S1 and S2 auscultated. Mild extremity edema.  Abdomen: Soft, non-tender, non-distended. No masses palpated. No appreciable hepatosplenomegaly. Bowel sounds positive x4.  GU: Deferred. Musculoskeletal: Right Leg Externally rotated and appeared had limited ROM.  Right Hip is tender.   Skin: No rashes, lesions, ulcers on a limited skin eval. No induration; Warm and dry.  Neurologic: CN 2-12 grossly intact with no focal deficits.  Romberg sign and cerebellar reflexes not assessed.  Psychiatric: Normal judgment and insight. Alert and oriented x 3. Normal mood and appropriate affect.   Data Reviewed: I have personally reviewed following labs and imaging studies  CBC: Recent Labs  Lab  02/10/17 1615 02/11/17 1031  WBC 4.8 6.1  NEUTROABS 3.8 4.8  HGB 10.8* 9.8*  HCT 32.9* 30.9*  MCV 88.2 87.8  PLT 155 135*   Basic Metabolic Panel: Recent Labs  Lab 02/10/17 1615 02/11/17 1031  NA 139 137  K 3.7 2.8*  CL 94* 94*  CO2 29 32  GLUCOSE 135* 156*  BUN 50* 38*  CREATININE 1.72* 1.67*  CALCIUM 9.8 8.7*  MG  --  1.9  PHOS  --  3.7   GFR: Estimated Creatinine Clearance: 27.3 mL/min (A) (by C-G formula based on SCr of 1.67 mg/dL (H)). Liver Function Tests: Recent Labs  Lab 02/11/17 1031  AST 20  ALT 15  ALKPHOS 37*  BILITOT 0.7  PROT 6.5  ALBUMIN 3.9   No results for input(s): LIPASE, AMYLASE in the last 168 hours. No results for input(s): AMMONIA in the last 168 hours. Coagulation Profile: Recent Labs  Lab 02/10/17 1615  INR 1.00   Cardiac Enzymes: No results for input(s): CKTOTAL, CKMB, CKMBINDEX, TROPONINI in the last 168 hours. BNP (last 3 results) Recent Labs    02/19/16 1121  PROBNP 7,074*   HbA1C: No results for input(s): HGBA1C in the last 72 hours. CBG: No results for input(s): GLUCAP in the last 168 hours. Lipid Profile: No results for input(s): CHOL, HDL, LDLCALC, TRIG, CHOLHDL, LDLDIRECT in the last 72 hours. Thyroid Function Tests: No results for input(s): TSH, T4TOTAL, FREET4, T3FREE, THYROIDAB in the last 72 hours. Anemia Panel: No results for input(s): VITAMINB12, FOLATE, FERRITIN, TIBC, IRON, RETICCTPCT in the last 72 hours. Sepsis Labs: No results for input(s): PROCALCITON, LATICACIDVEN in the last 168 hours.  Recent Results (from the past 240 hour(s))  Surgical pcr screen     Status: None   Collection Time: 02/11/17  1:04 AM  Result Value Ref Range Status   MRSA, PCR NEGATIVE NEGATIVE Final   Staphylococcus aureus NEGATIVE NEGATIVE Final    Comment: (NOTE) The Xpert SA Assay (FDA approved for NASAL specimens in patients 79 years of age and older), is one component of a comprehensive surveillance program. It is not  intended to diagnose infection nor to guide or monitor treatment.     Radiology Studies: Dg Chest 1 View  Result Date: 02/10/2017 CLINICAL DATA:  Fall. Preoperative respiratory evaluation for hip fracture. EXAM: CHEST 1 VIEW COMPARISON:  09/19/2016 FINDINGS: The lungs are clear without focal pneumonia, edema, pneumothorax or pleural effusion. Hyperexpansion suggests emphysema. Interstitial markings are diffusely coarsened with chronic features. The cardio pericardial silhouette is enlarged. Multiple bilateral old rib fractures evident. Telemetry leads overlie the chest. IMPRESSION: Cardiomegaly with emphysema.  No acute cardiopulmonary findings. Electronically Signed  By: Kennith Center M.D.   On: 02/10/2017 18:17   Dg Hip Unilat With Pelvis 2-3 Views Right  Result Date: 02/10/2017 CLINICAL DATA:  Preop.  Fall with right hip pain. EXAM: DG HIP (WITH OR WITHOUT PELVIS) 2-3V RIGHT COMPARISON:  12/16/2015 FINDINGS: AP view the pelvis and AP/frog leg views of the right hip. Comminuted, impacted subcapital or mid right femoral neck fracture with minimal varus angulation. No dislocation. Sacroiliac joints are symmetric. Support apparatus overlies the left hemipelvis, without gross left-sided abnormality identified. IMPRESSION: Right femoral neck fracture with impaction and mild varus angulation. Electronically Signed   By: Jeronimo Greaves M.D.   On: 02/10/2017 18:21   Scheduled Meds: . amLODipine  2.5 mg Oral Daily  . busPIRone  15 mg Oral TID  . cholecalciferol  1,000 Units Oral Daily  . clonazePAM  0.5 mg Oral BID  . donepezil  10 mg Oral QHS  . feeding supplement (ENSURE ENLIVE)  237 mL Oral Q1500  . furosemide  120 mg Oral BID  . isosorbide mononitrate  15 mg Oral Daily  . metolazone  2.5 mg Oral Once per day on Tue Sat  . metoprolol tartrate  25 mg Oral BID  . mometasone-formoterol  2 puff Inhalation BID  . pantoprazole  40 mg Oral Daily  . potassium chloride SA  20 mEq Oral BID  . potassium  chloride  40 mEq Oral BID  . pravastatin  40 mg Oral q1800  . sertraline  100 mg Oral Daily   Continuous Infusions:   LOS: 1 day   Merlene Laughter, DO [facility] Pager [phone]  If 7PM-7AM, please contact night-coverage [email or web address] Password [email or web address] 02/11/2017, 3:50 PM

## 2017-02-11 NOTE — Progress Notes (Signed)
Initial Nutrition Assessment  DOCUMENTATION CODES:   Obesity unspecified  INTERVENTION:  Provide Ensure Enlive po once daily, each supplement provides 350 kcal and 20 grams of protein.  Encourage adequate PO intake.   NUTRITION DIAGNOSIS:   Increased nutrient needs related to chronic illness(COPD, CHF) as evidenced by estimated needs.  GOAL:   Patient will meet greater than or equal to 90% of their needs  MONITOR:   PO intake, Supplement acceptance, Labs, Weight trends, I & O's, Skin  REASON FOR ASSESSMENT:   Consult Hip fracture protocol  ASSESSMENT:   81 y.o. female with PMH COPD, CHF presents with R hips fx after fall at home.   Plans for surgery tomorrow. Pt reports having a good appetite currently and PTA with usual consumption of at least 3 meals a day with no other difficulties. Weight ha been stable per weight records. RD to order nutritional supplements to aid in pre/post surgery nutrition requirements.   NUTRITION - FOCUSED PHYSICAL EXAM:    Most Recent Value  Orbital Region  No depletion  Upper Arm Region  No depletion  Thoracic and Lumbar Region  No depletion  Buccal Region  No depletion  Temple Region  No depletion  Clavicle Bone Region  No depletion  Clavicle and Acromion Bone Region  No depletion  Scapular Bone Region  No depletion  Dorsal Hand  No depletion  Patellar Region  No depletion  Anterior Thigh Region  No depletion  Posterior Calf Region  No depletion  Edema (RD Assessment)  None  Hair  Reviewed  Eyes  Reviewed  Mouth  Reviewed  Skin  Reviewed  Nails  Reviewed      Labs and medications reviewed.   Diet Order:  Diet regular Room service appropriate? Yes; Fluid consistency: Thin Diet NPO time specified  EDUCATION NEEDS:   Not appropriate for education at this time  Skin:     Last BM:  12/28  Height:   Ht Readings from Last 1 Encounters:  02/10/17 5\' 3"  (1.6 m)    Weight:   Wt Readings from Last 1 Encounters:   02/11/17 175 lb 0.7 oz (79.4 kg)    Ideal Body Weight:  52.27 kg  BMI:  Body mass index is 31.01 kg/m.  Estimated Nutritional Needs:   Kcal:  1600-1800  Protein:  75-85 grams  Fluid:  1.6 - 1.8 L/day    Roslyn Smiling, MS, RD, LDN Pager # [phone] After hours/ weekend pager # [phone]

## 2017-02-12 ENCOUNTER — Inpatient Hospital Stay (HOSPITAL_COMMUNITY)

## 2017-02-12 ENCOUNTER — Inpatient Hospital Stay (HOSPITAL_COMMUNITY): Admitting: Critical Care Medicine

## 2017-02-12 ENCOUNTER — Encounter (HOSPITAL_COMMUNITY): Payer: Self-pay | Admitting: Critical Care Medicine

## 2017-02-12 ENCOUNTER — Encounter (HOSPITAL_COMMUNITY)
Admission: EM | Disposition: A | Discharge status: Skilled Nursing Facility | Payer: Self-pay | Source: Home / Self Care | Attending: Internal Medicine

## 2017-02-12 DIAGNOSIS — S72001A Fracture of unspecified part of neck of right femur, initial encounter for closed fracture: Secondary | ICD-10-CM

## 2017-02-12 DIAGNOSIS — Z419 Encounter for procedure for purposes other than remedying health state, unspecified: Secondary | ICD-10-CM

## 2017-02-12 HISTORY — PX: ANTERIOR APPROACH HEMI HIP ARTHROPLASTY: SHX6690

## 2017-02-12 LAB — CBC WITH DIFFERENTIAL/PLATELET
BASOS PCT: 0 %
Basophils Absolute: 0 10*3/uL (ref 0.0–0.1)
EOS ABS: 0.3 10*3/uL (ref 0.0–0.7)
Eosinophils Relative: 4 %
HCT: 32.6 % — ABNORMAL LOW (ref 36.0–46.0)
HEMOGLOBIN: 10.6 g/dL — AB (ref 12.0–15.0)
LYMPHS ABS: 0.5 10*3/uL — AB (ref 0.7–4.0)
LYMPHS PCT: 7 %
MCH: 28.8 pg (ref 26.0–34.0)
MCHC: 32.5 g/dL (ref 30.0–36.0)
MCV: 88.6 fL (ref 78.0–100.0)
Monocytes Absolute: 0.4 10*3/uL (ref 0.1–1.0)
Monocytes Relative: 5 %
NEUTROS ABS: 6.2 10*3/uL (ref 1.7–7.7)
Neutrophils Relative %: 84 %
Platelets: 147 10*3/uL — ABNORMAL LOW (ref 150–400)
RBC: 3.68 MIL/uL — ABNORMAL LOW (ref 3.87–5.11)
RDW: 14.1 % (ref 11.5–15.5)
WBC: 7.5 10*3/uL (ref 4.0–10.5)

## 2017-02-12 LAB — COMPREHENSIVE METABOLIC PANEL
ALBUMIN: 4 g/dL (ref 3.5–5.0)
ALK PHOS: 40 U/L (ref 38–126)
ALT: 13 U/L — ABNORMAL LOW (ref 14–54)
ANION GAP: 10 (ref 5–15)
AST: 18 U/L (ref 15–41)
BUN: 36 mg/dL — ABNORMAL HIGH (ref 6–20)
CALCIUM: 9 mg/dL (ref 8.9–10.3)
CHLORIDE: 94 mmol/L — AB (ref 101–111)
CO2: 35 mmol/L — AB (ref 22–32)
Creatinine, Ser: 1.67 mg/dL — ABNORMAL HIGH (ref 0.44–1.00)
GFR calc Af Amer: 33 mL/min — ABNORMAL LOW (ref >60)
GFR calc non Af Amer: 28 mL/min — ABNORMAL LOW (ref >60)
GLUCOSE: 146 mg/dL — AB (ref 65–99)
POTASSIUM: 3.6 mmol/L (ref 3.5–5.1)
SODIUM: 139 mmol/L (ref 135–145)
Total Bilirubin: 0.7 mg/dL (ref 0.3–1.2)
Total Protein: 7.1 g/dL (ref 6.5–8.1)

## 2017-02-12 LAB — MAGNESIUM: Magnesium: 1.9 mg/dL (ref 1.7–2.4)

## 2017-02-12 LAB — PHOSPHORUS: Phosphorus: 3.3 mg/dL (ref 2.5–4.6)

## 2017-02-12 SURGERY — HEMIARTHROPLASTY, HIP, DIRECT ANTERIOR APPROACH, FOR FRACTURE
Anesthesia: General | Site: Hip | Laterality: Right

## 2017-02-12 MED ORDER — ONDANSETRON HCL 4 MG PO TABS: 4.0000 mg | ORAL_TABLET | Freq: Four times a day (QID) | ORAL | Status: DC | PRN

## 2017-02-12 MED ORDER — MORPHINE SULFATE (PF) 2 MG/ML IV SOLN: 0.5000 mg | INTRAVENOUS | Status: DC | PRN

## 2017-02-12 MED ORDER — DEXAMETHASONE SODIUM PHOSPHATE 10 MG/ML IJ SOLN
INTRAMUSCULAR | Status: AC
  Filled 2017-02-12: qty 1

## 2017-02-12 MED ORDER — HYDROCODONE-ACETAMINOPHEN 5-325 MG PO TABS: 1.0000 | ORAL_TABLET | Freq: Four times a day (QID) | ORAL | 0 refills | Status: DC | PRN

## 2017-02-12 MED ORDER — ROCURONIUM BROMIDE 10 MG/ML (PF) SYRINGE
PREFILLED_SYRINGE | INTRAVENOUS | Status: AC
  Filled 2017-02-12: qty 5

## 2017-02-12 MED ORDER — DEXAMETHASONE SODIUM PHOSPHATE 10 MG/ML IJ SOLN
INTRAMUSCULAR | Status: DC | PRN
  Administered 2017-02-12: 10 mg via INTRAVENOUS

## 2017-02-12 MED ORDER — METOCLOPRAMIDE HCL 5 MG PO TABS: 5.0000 mg | ORAL_TABLET | Freq: Three times a day (TID) | ORAL | Status: DC | PRN

## 2017-02-12 MED ORDER — ACETAMINOPHEN 650 MG RE SUPP: 650.0000 mg | Freq: Four times a day (QID) | RECTAL | Status: DC | PRN

## 2017-02-12 MED ORDER — ENOXAPARIN SODIUM 40 MG/0.4ML ~~LOC~~ SOLN: 40.0000 mg | Freq: Every day | SUBCUTANEOUS | 0 refills | Status: DC

## 2017-02-12 MED ORDER — SUGAMMADEX SODIUM 200 MG/2ML IV SOLN
INTRAVENOUS | Status: AC
  Filled 2017-02-12: qty 2

## 2017-02-12 MED ORDER — FENTANYL CITRATE (PF) 250 MCG/5ML IJ SOLN
INTRAMUSCULAR | Status: DC | PRN
  Administered 2017-02-12 (×4): 50 ug via INTRAVENOUS

## 2017-02-12 MED ORDER — PHENYLEPHRINE HCL 10 MG/ML IJ SOLN
INTRAVENOUS | Status: DC | PRN
  Administered 2017-02-12: 50 ug/min via INTRAVENOUS

## 2017-02-12 MED ORDER — METOCLOPRAMIDE HCL 5 MG/ML IJ SOLN: 5.0000 mg | Freq: Three times a day (TID) | INTRAMUSCULAR | Status: DC | PRN

## 2017-02-12 MED ORDER — METHOCARBAMOL 500 MG PO TABS
500.0000 mg | ORAL_TABLET | Freq: Four times a day (QID) | ORAL | Status: DC | PRN
Start: 2017-02-12 — End: 2017-02-17
  Administered 2017-02-12 – 2017-02-15 (×2): 500 mg via ORAL
  Filled 2017-02-12 (×3): qty 1

## 2017-02-12 MED ORDER — 0.9 % SODIUM CHLORIDE (POUR BTL) OPTIME
TOPICAL | Status: DC | PRN
  Administered 2017-02-12: 1000 mL

## 2017-02-12 MED ORDER — SODIUM CHLORIDE 0.9 % IJ SOLN
INTRAMUSCULAR | Status: AC
  Filled 2017-02-12: qty 10

## 2017-02-12 MED ORDER — SODIUM CHLORIDE 0.9 % IV SOLN: INTRAVENOUS | Status: DC

## 2017-02-12 MED ORDER — HYDROCODONE-ACETAMINOPHEN 5-325 MG PO TABS
1.0000 | ORAL_TABLET | Freq: Four times a day (QID) | ORAL | Status: DC | PRN
  Administered 2017-02-12 – 2017-02-17 (×8): 1 via ORAL
  Filled 2017-02-12 (×8): qty 1

## 2017-02-12 MED ORDER — FENTANYL CITRATE (PF) 250 MCG/5ML IJ SOLN
INTRAMUSCULAR | Status: AC
  Filled 2017-02-12: qty 5

## 2017-02-12 MED ORDER — ALUM & MAG HYDROXIDE-SIMETH 200-200-20 MG/5ML PO SUSP: 30.0000 mL | ORAL | Status: DC | PRN

## 2017-02-12 MED ORDER — SODIUM CHLORIDE 0.9 % IR SOLN
Status: DC | PRN
  Administered 2017-02-12: 1000 mL

## 2017-02-12 MED ORDER — ENOXAPARIN SODIUM 30 MG/0.3ML ~~LOC~~ SOLN
30.0000 mg | SUBCUTANEOUS | Status: DC
  Administered 2017-02-13 – 2017-02-17 (×4): 30 mg via SUBCUTANEOUS
  Filled 2017-02-12 (×4): qty 0.3

## 2017-02-12 MED ORDER — TRANEXAMIC ACID 1000 MG/10ML IV SOLN
2000.0000 mg | Freq: Once | INTRAVENOUS | Status: DC
  Filled 2017-02-12: qty 20

## 2017-02-12 MED ORDER — ROCURONIUM BROMIDE 10 MG/ML (PF) SYRINGE
PREFILLED_SYRINGE | INTRAVENOUS | Status: DC | PRN
  Administered 2017-02-12: 10 mg via INTRAVENOUS
  Administered 2017-02-12: 50 mg via INTRAVENOUS

## 2017-02-12 MED ORDER — LACTATED RINGERS IV SOLN
INTRAVENOUS | Status: DC | PRN
  Administered 2017-02-12: 07:00:00 via INTRAVENOUS

## 2017-02-12 MED ORDER — ONDANSETRON HCL 4 MG/2ML IJ SOLN: 4.0000 mg | Freq: Four times a day (QID) | INTRAMUSCULAR | Status: DC | PRN

## 2017-02-12 MED ORDER — SUGAMMADEX SODIUM 200 MG/2ML IV SOLN
INTRAVENOUS | Status: DC | PRN
  Administered 2017-02-12: 160 mg via INTRAVENOUS

## 2017-02-12 MED ORDER — PROMETHAZINE HCL 25 MG/ML IJ SOLN: 6.2500 mg | INTRAMUSCULAR | Status: DC | PRN

## 2017-02-12 MED ORDER — LIDOCAINE 2% (20 MG/ML) 5 ML SYRINGE
INTRAMUSCULAR | Status: AC
  Filled 2017-02-12: qty 5

## 2017-02-12 MED ORDER — CEFAZOLIN SODIUM-DEXTROSE 2-3 GM-%(50ML) IV SOLR
INTRAVENOUS | Status: DC | PRN
  Administered 2017-02-12: 2 g via INTRAVENOUS

## 2017-02-12 MED ORDER — PROPOFOL 10 MG/ML IV BOLUS
INTRAVENOUS | Status: AC
  Filled 2017-02-12: qty 20

## 2017-02-12 MED ORDER — PHENOL 1.4 % MT LIQD: 1.0000 | OROMUCOSAL | Status: DC | PRN

## 2017-02-12 MED ORDER — PHENYLEPHRINE 40 MCG/ML (10ML) SYRINGE FOR IV PUSH (FOR BLOOD PRESSURE SUPPORT)
PREFILLED_SYRINGE | INTRAVENOUS | Status: DC | PRN
  Administered 2017-02-12 (×2): 80 ug via INTRAVENOUS
  Administered 2017-02-12 (×3): 40 ug via INTRAVENOUS

## 2017-02-12 MED ORDER — ACETAMINOPHEN 325 MG PO TABS
650.0000 mg | ORAL_TABLET | Freq: Four times a day (QID) | ORAL | Status: DC | PRN
  Administered 2017-02-12 – 2017-02-17 (×7): 650 mg via ORAL
  Filled 2017-02-12 (×7): qty 2

## 2017-02-12 MED ORDER — MORPHINE SULFATE (PF) 4 MG/ML IV SOLN: 1.0000 mg | INTRAVENOUS | Status: DC | PRN

## 2017-02-12 MED ORDER — METHOCARBAMOL 1000 MG/10ML IJ SOLN
500.0000 mg | Freq: Four times a day (QID) | INTRAVENOUS | Status: DC | PRN
  Filled 2017-02-12: qty 5

## 2017-02-12 MED ORDER — ALBUTEROL SULFATE (2.5 MG/3ML) 0.083% IN NEBU
INHALATION_SOLUTION | RESPIRATORY_TRACT | Status: AC
  Filled 2017-02-12: qty 3

## 2017-02-12 MED ORDER — CEFAZOLIN SODIUM-DEXTROSE 2-4 GM/100ML-% IV SOLN
2.0000 g | Freq: Four times a day (QID) | INTRAVENOUS | Status: AC
  Administered 2017-02-12 (×2): 2 g via INTRAVENOUS
  Filled 2017-02-12 (×3): qty 100

## 2017-02-12 MED ORDER — LIDOCAINE 2% (20 MG/ML) 5 ML SYRINGE
INTRAMUSCULAR | Status: DC | PRN
  Administered 2017-02-12: 60 mg via INTRAVENOUS

## 2017-02-12 MED ORDER — TRANEXAMIC ACID 1000 MG/10ML IV SOLN
INTRAVENOUS | Status: AC | PRN
  Administered 2017-02-12: 2000 mg via TOPICAL

## 2017-02-12 MED ORDER — ONDANSETRON HCL 4 MG/2ML IJ SOLN
INTRAMUSCULAR | Status: DC | PRN
  Administered 2017-02-12: 4 mg via INTRAVENOUS

## 2017-02-12 MED ORDER — MENTHOL 3 MG MT LOZG: 1.0000 | LOZENGE | OROMUCOSAL | Status: DC | PRN

## 2017-02-12 MED ORDER — OXYCODONE HCL 5 MG PO TABS: 5.0000 mg | ORAL_TABLET | ORAL | Status: DC | PRN

## 2017-02-12 MED ORDER — PROPOFOL 10 MG/ML IV BOLUS
INTRAVENOUS | Status: DC | PRN
  Administered 2017-02-12: 60 mg via INTRAVENOUS

## 2017-02-12 SURGICAL SUPPLY — 42 items
BAG DECANTER FOR FLEXI CONT (MISCELLANEOUS) ×3 IMPLANT
CAPT HIP HEMI 2 ×3 IMPLANT
CELLS DAT CNTRL 66122 CELL SVR (MISCELLANEOUS) ×1 IMPLANT
COVER SURGICAL LIGHT HANDLE (MISCELLANEOUS) ×3 IMPLANT
DRAPE C-ARM 42X72 X-RAY (DRAPES) ×3 IMPLANT
DRAPE STERI IOBAN 125X83 (DRAPES) ×3 IMPLANT
DRAPE U-SHAPE 47X51 STRL (DRAPES) ×6 IMPLANT
DRSG AQUACEL AG ADV 3.5X10 (GAUZE/BANDAGES/DRESSINGS) ×3 IMPLANT
DRSG MEPILEX BORDER 4X8 (GAUZE/BANDAGES/DRESSINGS) IMPLANT
DURAPREP 26ML APPLICATOR (WOUND CARE) ×3 IMPLANT
ELECT BLADE 4.0 EZ CLEAN MEGAD (MISCELLANEOUS) ×3
ELECT REM PT RETURN 9FT ADLT (ELECTROSURGICAL) ×3
ELECTRODE BLDE 4.0 EZ CLN MEGD (MISCELLANEOUS) ×1 IMPLANT
ELECTRODE REM PT RTRN 9FT ADLT (ELECTROSURGICAL) ×1 IMPLANT
GLOVE SKINSENSE NS SZ7.5 (GLOVE) ×2
GLOVE SKINSENSE STRL SZ7.5 (GLOVE) ×1 IMPLANT
GLOVE SURG SYN 7.5  E (GLOVE) ×4
GLOVE SURG SYN 7.5 E (GLOVE) ×2 IMPLANT
GOWN SRG XL XLNG 56XLVL 4 (GOWN DISPOSABLE) ×1 IMPLANT
GOWN STRL NON-REIN XL XLG LVL4 (GOWN DISPOSABLE) ×2
GOWN STRL REUS W/ TWL LRG LVL3 (GOWN DISPOSABLE) IMPLANT
GOWN STRL REUS W/TWL LRG LVL3 (GOWN DISPOSABLE)
HANDPIECE INTERPULSE COAX TIP (DISPOSABLE) ×2
HOOD PEEL AWAY FLYTE STAYCOOL (MISCELLANEOUS) ×6 IMPLANT
IV NS IRRIG 3000ML ARTHROMATIC (IV SOLUTION) ×3 IMPLANT
KIT BASIN OR (CUSTOM PROCEDURE TRAY) ×3 IMPLANT
MARKER SKIN DUAL TIP RULER LAB (MISCELLANEOUS) ×3 IMPLANT
PACK TOTAL JOINT (CUSTOM PROCEDURE TRAY) ×3 IMPLANT
PACK UNIVERSAL I (CUSTOM PROCEDURE TRAY) ×3 IMPLANT
RTRCTR WOUND ALEXIS 18CM MED (MISCELLANEOUS) ×3
SAW OSC TIP CART 19.5X105X1.3 (SAW) ×3 IMPLANT
SET HNDPC FAN SPRY TIP SCT (DISPOSABLE) ×1 IMPLANT
STAPLER VISISTAT 35W (STAPLE) IMPLANT
SUT ETHIBOND 2 V 37 (SUTURE) ×3 IMPLANT
SUT VIC AB 1 CT1 27 (SUTURE) ×2
SUT VIC AB 1 CT1 27XBRD ANBCTR (SUTURE) ×1 IMPLANT
SUT VIC AB 2-0 CT1 27 (SUTURE) ×8
SUT VIC AB 2-0 CT1 36 (SUTURE) ×6 IMPLANT
SUT VIC AB 2-0 CT1 TAPERPNT 27 (SUTURE) ×4 IMPLANT
TOWEL OR 17X26 10 PK STRL BLUE (TOWEL DISPOSABLE) ×3 IMPLANT
TRAY CATH 16FR W/PLASTIC CATH (SET/KITS/TRAYS/PACK) ×3 IMPLANT
YANKAUER SUCT BULB TIP NO VENT (SUCTIONS) ×3 IMPLANT

## 2017-02-12 NOTE — Discharge Instructions (Signed)
° ° °  1. Change dressings as needed °2. May shower but keep incisions covered and dry °3. Take lovenox to prevent blood clots °4. Take stool softeners as needed °5. Take pain meds as needed ° °

## 2017-02-12 NOTE — Op Note (Signed)
ANTERIOR APPROACH HEMI HIP ARTHROPLASTY  Procedure Note Baird KayMargie K Kasprzak   [id number]  Pre-op Diagnosis: right hip fracture     Post-op Diagnosis: same   Operative Procedures  1. Prosthetic replacement for femoral neck fracture. CPT [phone]  Personnel  Surgeon(s): Tarry KosXu, Marquist Binstock M, MD  ASSIST: Oneal GroutMary Lindsey Stanbery, PA-C; necessary for the timely completion of procedure and due to complexity of procedure.   Anesthesia: general  Prosthesis: Depuy Femur: Corail KLA 12 Head: 48 mm size: +1.5 Bearing Type: bipolar  Hip Hemiarthroplasty (Anterior Approach) Op Note:  After informed consent was obtained and the operative extremity marked in the holding area, the patient was brought back to the operating room and placed supine on the HANA table. Next, the operative extremity was prepped and draped in normal sterile fashion. Surgical timeout occurred verifying patient identification, surgical site, surgical procedure and administration of antibiotics.  A modified anterior Smith-Peterson approach to the hip was performed, using the interval between tensor fascia lata and sartorius.  Dissection was carried bluntly down onto the anterior hip capsule. The lateral femoral circumflex vessels were identified and coagulated. A capsulotomy was performed and the capsular flaps tagged for later repair.  Fluoroscopy was utilized to prepare for the femoral neck cut. The neck osteotomy was performed. The femoral head was removed and found a 48 mm head was the appropriate fit.    We then turned our attention to the femur.  After placing the femoral hook, the leg was taken to externally rotated, extended and adducted position taking care to perform soft tissue releases to allow for adequate mobilization of the femur. Soft tissue was cleared from the shoulder of the greater trochanter and the hook elevator used to improve exposure of the proximal femur. Sequential broaching performed up to a size 12. Trial neck and  head were placed. The leg was brought back up to neutral and the construct reduced. The position and sizing of components, offset and leg lengths were checked using fluoroscopy. Stability of the construct was checked in extension and external rotation without any subluxation or impingement of prosthesis. We dislocated the prosthesis, dropped the leg back into position, removed trial components, and irrigated copiously. The final stem and head was then placed, the leg brought back up, the system reduced and fluoroscopy used to verify positioning.  We irrigated, obtained hemostasis and closed the capsule using #2 ethibond suture.  The fascia was closed with #1 vicryl plus, the deep fat layer was closed with 0 vicryl, the subcutaneous layers closed with 2.0 Vicryl Plus and the skin closed with staples. A sterile dressing was applied. The patient was awakened in the operating room and taken to recovery in stable condition. All sponge, needle, and instrument counts were correct at the end of the case.   Position: supine  Complications: none.  Time Out: performed   Drains/Packing: none  Estimated blood loss: 150 cc  Returned to Recovery Room: in good condition.   Antibiotics: yes   Mechanical VTE (DVT) Prophylaxis: sequential compression devices, TED thigh-high  Chemical VTE (DVT) Prophylaxis: lovenox  Fluid Replacement: Crystalloid: see anesthesia record  Specimens Removed: 1 to pathology   Sponge and Instrument Count Correct? yes   PACU: portable radiograph - low AP   Admission: inpatient status, start PT & OT POD#1  Plan/RTC: Return in 2 weeks for staple removal. Return in 6 weeks to see MD.  Weight Bearing/Load Lower Extremity: full  Hip precautions: none Suture Removal: 10-14 days  Betadine to incision  twice daily once dressing is removed on POD#7  N. Glee Arvin, MD [facility] [phone] 9:08 AM    Implant Name Type Inv. Item Serial No. Manufacturer Lot No. LRB  No. Used  HIP BALL ARTICU DEPUY - [id number] Hips HIP BALL ARTICU DEPUY [id number] DEPUY SYNTHES [id number] Right 1  STEM [facility] - [id number] Stem STEM CORAIL [id number] DEPUY SYNTHES [id number] Right 1  BIPOLAR DEPUY - [id number] Hips BIPOLAR DEPUY [id number] DEPUY SYNTHES [id number] Right 1

## 2017-02-12 NOTE — Consult Note (Signed)
ORTHOPAEDIC CONSULTATION  REQUESTING PHYSICIAN: Merlene Laughter, DO  Chief Complaint: Right femoral neck hip fracture  HPI: [name] is a 81 y.o. female who presents with right femoral neck hip fracture s/p mechanical fall PTA.  The patient endorses severe pain in the right hip, that does not radiate, grinding in quality, worse with any movement, better with immobilization.  Denies LOC/fever/chills/nausea/vomiting.  Walks with assistive devices (walker, cane, wheelchair).  Does live independently with family.  Denies LOC, neck pain, abd pain.  Household ambulator.   Past Medical History:  Diagnosis Date  . Acute on chronic diastolic heart failure (HCC) 04/11/2014  . Asthma   . Bradycardia   . CAD (coronary artery disease) 08/2009   s/p PCI of the LAD  . Carotid artery stenosis   . Chronic headache   . COPD (chronic obstructive pulmonary disease) (HCC)   . Dementia    patient denies this  . Hyperlipidemia   . Hypertension   . Impaired fasting glucose   . Myocardial infarction (HCC)    Non-st segment elevated  . On home O2   . Osteoporosis 2009  . Pneumonia   . Seasonal allergies   . Ulnar neuropathy    Past Surgical History:  Procedure Laterality Date  . BLADDER SURGERY  1991  . CARDIAC CATHETERIZATION  11/2010   negative  . CATARACT EXTRACTION  Nov 2016  . LEFT HEART CATHETERIZATION WITH CORONARY ANGIOGRAM N/A 11/05/2012   Procedure: LEFT HEART CATHETERIZATION WITH CORONARY ANGIOGRAM;  Surgeon: Micheline Chapman, MD;  Location: [facility] CATH LAB;  Service: Cardiovascular;  Laterality: N/A;  . PARTIAL HYSTERECTOMY  1984  . Radial artery catheter     Bladder   Social History   Socioeconomic History  . Marital status: Married    Spouse name: None  . Number of children: None  . Years of education: None  . Highest education level: None  Social Needs  . Financial resource strain: None  . Food insecurity - worry: None  . Food insecurity - inability: None  .  Transportation needs - medical: None  . Transportation needs - non-medical: None  Occupational History  . Occupation: Retired    Associate Professor: RETIRED    Comment: Scientist, product/process development  Tobacco Use  . Smoking status: Former Smoker    Packs/day: 1.00    Years: 35.00    Pack years: 35.00    Last attempt to quit: 02/15/2008    Years since quitting: 9.0  . Smokeless tobacco: Never Used  Substance and Sexual Activity  . Alcohol use: No  . Drug use: No  . Sexual activity: Not Currently  Other Topics Concern  . None  Social History Narrative   Lives in [place] with husband of 52 years   Granddaughter works at [employer] in [place]   Family History  Problem Relation Age of Onset  . Addison's disease Mother   . Alcohol abuse Father    Allergies  Allergen Reactions  . Amoxil [Amoxicillin]     Thrush each times she takes it  . Codeine Nausea And Vomiting  . Dilaudid [Hydromorphone Hcl] Other (See Comments)    Extremely sensitive  . Erythromycin Other (See Comments)    Intense stomach pain  . Sulfonamide Derivatives     unknown   Prior to Admission medications   Medication Sig Start Date End Date Taking? Authorizing Provider  acetaminophen (TYLENOL) 500 MG tablet Take 500 mg by mouth every 4 (four) hours.   Yes  [provider]  albuterol (PROVENTIL HFA;VENTOLIN HFA) 108 (90 Base) MCG/ACT inhaler Inhale 2 puffs into the lungs every 4 (four) hours as needed for wheezing or shortness of breath. 06/02/16  Yes Babs SciaraLuking, Scott A, MD  albuterol (PROVENTIL) (2.5 MG/3ML) 0.083% nebulizer solution Take 3 mLs (2.5 mg total) by nebulization every 4 (four) hours as needed for wheezing or shortness of breath. 10/12/15  Yes Luking, Jonna CoupScott A, MD  amLODipine (NORVASC) 5 MG tablet TAKE 1/2 TABLET BY MOUTH DAILY 07/18/16  Yes Merlyn AlbertLuking, William S, MD  aspirin EC 81 MG tablet Take 81 mg by mouth daily.   Yes [provider]  budesonide-formoterol (SYMBICORT) 160-4.5 MCG/ACT inhaler Inhale 2 puffs  into the lungs 2 (two) times daily. 11/23/16  Yes Luking, Jonna CoupScott A, MD  busPIRone (BUSPAR) 15 MG tablet Take 1 tablet (15 mg total) by mouth 3 (three) times daily. 08/23/16  Yes Babs SciaraLuking, Scott A, MD  Calcium Carb-Cholecalciferol (CALCIUM 600 + D) 600-200 MG-UNIT TABS Take 1 tablet by mouth daily.   Yes [provider]  cetirizine (ZYRTEC) 10 MG tablet Take 10 mg by mouth daily.    Yes [provider]  clonazePAM (KLONOPIN) 0.5 MG tablet Take 1/2 tablet by mouth in the morning and 1 whole tablet at bedtime Patient taking differently: Take 0.25 mg by mouth at bedtime.  12/30/16  Yes Merlyn AlbertLuking, William S, MD  erythromycin ophthalmic ointment Place 1 application into the right eye 4 (four) times daily.   Yes [provider]  furosemide (LASIX) 40 MG tablet Take 3 tabs BID Patient taking differently: Take 120 mg by mouth 2 (two) times daily.  07/15/16  Yes Luking, Scott A, MD  ipratropium (ATROVENT) 0.02 % nebulizer solution USE 1 VIAL VIA NEBULIZER EVERY 6 HOURS AS NEEDED FOR WHEEZING OR SHORTNESS OF BREATH 10/25/16  Yes Luking, Jonna CoupScott A, MD  losartan (COZAAR) 25 MG tablet TAKE 1 TABLET BY MOUTH DAILY 01/16/17  Yes Luking, Jonna CoupScott A, MD  meclizine (ANTIVERT) 12.5 MG tablet Take 12.5 mg by mouth every 6 (six) hours as needed for dizziness.   Yes [provider]  metolazone (ZAROXOLYN) 2.5 MG tablet TAKE 1 TABLET BY MOUTH DAILY ON TUESDAYS AND THURSDAYS Patient taking differently: TAKE 1 TABLET BY MOUTH DAILY ON TUESDAYS AND SATURDAYS 09/16/16  Yes Rosalio MacadamiaGerhardt, Lori C, NP  metoprolol tartrate (LOPRESSOR) 25 MG tablet TAKE 1 TABLET BY MOUTH TWICE DAILY Patient taking differently: TAKE 1/2 TABLET BY MOUTH TWICE DAILY 08/11/16  Yes Luking, Jonna CoupScott A, MD  morphine (ROXANOL) 20 MG/ML concentrated solution Give 0.125 mL (2.5 mg) up to 0.25 mL (5 mg) every four hours as needed. Patient taking differently: Take by mouth every 4 (four) hours as needed. Give 0.2 mL ON SCHEDULE EVERY NIGHT AT BEDTIME  (MORPHINE 100/5ML ON HAND) 08/31/16  Yes Merlyn AlbertLuking, William S, MD  nitroGLYCERIN (NITROSTAT) 0.4 MG SL tablet Place 1 tablet (0.4 mg total) under the tongue as needed for chest pain. 12/18/14  Yes Babs SciaraLuking, Scott A, MD  omeprazole (PRILOSEC) 20 MG capsule Take 1 capsule (20 mg total) by mouth daily. 09/06/16  Yes Luking, Jonna CoupScott A, MD  ondansetron (ZOFRAN ODT) 8 MG disintegrating tablet Take 1 tablet (8 mg total) by mouth every 8 (eight) hours as needed for nausea or vomiting. 01/18/17  Yes Babs SciaraLuking, Scott A, MD  polyethylene glycol (MIRALAX / GLYCOLAX) packet Take 17 g by mouth daily as needed for moderate constipation. 04/16/14  Yes Elliot CousinFisher, Denise, MD  potassium chloride SA (K-DUR,KLOR-CON) 20 MEQ  tablet TAKE 1 TABLET(20 MEQ) BY MOUTH TWICE DAILY 01/16/17  Yes Luking, Scott A, MD  sertraline (ZOLOFT) 100 MG tablet TAKE 1& 1/2 TABLETS BY MOUTH DAILY 11/16/16  Yes Luking, Scott A, MD  Simethicone (GAS RELIEF PO) Take 1 tablet by mouth daily as needed (gas relief).   Yes [provider]  cefPROZIL (CEFZIL) 250 MG tablet Take 250 mg by mouth 2 (two) times daily. 5 DAY COURSE STARTING ON 02/03/2017 COMPLETED 02/03/17   [provider]   Dg Chest 1 View  Result Date: 02/10/2017 CLINICAL DATA:  Fall. Preoperative respiratory evaluation for hip fracture. EXAM: CHEST 1 VIEW COMPARISON:  09/19/2016 FINDINGS: The lungs are clear without focal pneumonia, edema, pneumothorax or pleural effusion. Hyperexpansion suggests emphysema. Interstitial markings are diffusely coarsened with chronic features. The cardio pericardial silhouette is enlarged. Multiple bilateral old rib fractures evident. Telemetry leads overlie the chest. IMPRESSION: Cardiomegaly with emphysema.  No acute cardiopulmonary findings. Electronically Signed   By: Kennith Center M.D.   On: 02/10/2017 18:17   Dg Hip Unilat With Pelvis 2-3 Views Right  Result Date: 02/10/2017 CLINICAL DATA:  Preop.  Fall with right hip pain. EXAM: DG HIP (WITH OR  WITHOUT PELVIS) 2-3V RIGHT COMPARISON:  12/16/2015 FINDINGS: AP view the pelvis and AP/frog leg views of the right hip. Comminuted, impacted subcapital or mid right femoral neck fracture with minimal varus angulation. No dislocation. Sacroiliac joints are symmetric. Support apparatus overlies the left hemipelvis, without gross left-sided abnormality identified. IMPRESSION: Right femoral neck fracture with impaction and mild varus angulation. Electronically Signed   By: Jeronimo Greaves M.D.   On: 02/10/2017 18:21    All pertinent xrays, MRI, CT independently reviewed and interpreted  Positive ROS: All other systems have been reviewed and were otherwise negative with the exception of those mentioned in the HPI and as above.  Physical Exam: General: Alert, no acute distress Cardiovascular: No pedal edema Respiratory: No cyanosis, no use of accessory musculature GI: No organomegaly, abdomen is soft and non-tender Skin: No lesions in the area of chief complaint Neurologic: Sensation intact distally Psychiatric: Patient is competent for consent with normal mood and affect Lymphatic: No axillary or cervical lymphadenopathy  MUSCULOSKELETAL:  - pain with movement of the hip and extremity - skin intact - NVI distally - compartments soft  Assessment: Right femoral neck hip fracture  Plan: - partial hip replacement is recommended, patient and family are aware of r/b/a and wish to proceed - consent obtained - medical optimization per primary team - surgery is planned for Sunday morning   Thank you for the consult and the opportunity to see [name]. Glee Arvin, MD [facility] [phone] 9:07 AM

## 2017-02-12 NOTE — Progress Notes (Signed)
PROGRESS NOTE    CLAUDIO GAILLARD  XNT:[id number] DOB: [birth year] DOA: 02/10/2017 PCP: Babs Sciara, MD   Brief Narrative:  [name] is a 81 y.o. female with medical history significant for emphysema with chronic hypoxic respiratory failure, coronary artery disease, chronic diastolic CHF, anxiety disorder, dementia, and hypertension, and other comorbids now presenting to the emergency department with severe right hip pain after patient reports that she had been in her usual state until tripping over her oxygen tubing, landing on her right side, and experiencing immediate and severe pain at the right hip.  She denies hitting her head or losing consciousness.  No recent fevers or chills.  Family provided additional history: She can ambulate very slowly with a walker at home and becomes short of breath any ambulation at her baseline she dresses herself, but very slowly and with obvious dyspnea.  She takes 120 mg of Lasix twice daily, as well as metolazone twice a week, and family reports that she quickly develops fluid overload when missing even 1 dose.  Patient denies recent chest pain, though her family reports that she complained of chest pain on the morning of her presentation to the hospice nurse, was treated with a nitroglycerin, experienced relief with that.  Upon arrival to the ED, Radiographs of the right hip demonstrate right femoral neck fracture with impaction and mild varus angulation. Patient was treated with Fentanyl and Zofran in the ED.  Orthopedic surgery was consulted by the ED physician and recommended a medical admission to [facility] for possible surgical management. She was admitted to the medical-surgical unit for ongoing evaluation and management of right femoral neck fracture. Orthopedic Surgery evaluated her and she underwent Hemiarthroplasty of the Right Hip this AM.  Assessment & Plan:   Principal Problem:   Closed right hip fracture, initial  encounter (HCC) Active Problems:   Dementia   CAD (coronary artery disease)   Chronic respiratory failure with hypoxia (HCC)   CKD (chronic kidney disease) stage 3, GFR 30-59 ml/min (HCC)   Chronic diastolic congestive heart failure (HCC)   Benign essential tremor   COPD (chronic obstructive pulmonary disease) (HCC)   Anxiety   Hypokalemia  Right Hip Fracture s/p Prosthetic replacement for femoral neck fracture from an Anterior Approach POD 0 -Presented with severe right hip pain after a trip and fall at home  -Radiographs confirm right femoral neck fracture  -Orthopedic Surgery Consulted and recommended Transfer to [facility] -Based on the available data, Ms. [place] presents a ~5.5% estimated risk probability for perioperative MI or cardiac arrest per Nolon Nations al  -Patient underwent Surgery this AM -Was on Bedrest will need PT/PT  -Heart Healthy Diet resumed  -Follow Volume Status and Strict I's/O's -Pain Control with Hydrocodone-Acetaminophen 1-2 tab po q4hprn Moderate Pain and IV Morphine 1-2 mg q2hprn Severe Pain -Defer Lovenox DVT Prophylaxis to Orthopedic Surgery -Continue to Monitor   COPD; Chronic Respiratory Failure with Hypoxia  -Treated for exacerbation earlier this month  -No wheezing or cough on admission  -Family reports that at baseline, she is dyspneic with dressing herself and has to stop and rest during the process  -C/w Albuterol 2.5 mg Neb q4hprn Wheezing and SOB, and C/w Ipratropium 0.5 mg Neb q6hprn -C/w Dulera 2 puff RTBID -CXR on Admission showed The lungs are clear without focal pneumonia, edema, pneumothorax or pleural effusion. Hyperexpansion suggests emphysema. Interstitial markings are diffusely coarsened with chronic features. The cardio pericardial silhouette is enlarged. Multiple bilateral old rib  fractures evident -Continue to Monitor respiratory status   Chronic Diastolic CHF  - Appeard euvolemic on admission and currently not in Exacerbation    - TTE (08/10/15) with EF 65-70%, mod-sev LVH, mild MR, diastolic function not determined  - Managed at Home with Lasix 120 mg BID, Metolazone 2.5 mg po (Sat and Tuesday), Metoprolol 25 mg po BID, and ARB  - Continue Diuretics and Beta-blocker, holding ARB for surgery  -C/w Potassium Repletion with 20 mEQ po BID  -Strict I's/O's, Daily Weights, SLIV, and Fluid Restriction -She is -1,200 mL -Weight is up 11 lbs but ? Accuracy -Continue to Monitor Volume Status   CAD  - Per family, She reported chest pain to hospice nurse the am of 12/28, was treated with NTG x1, and experienced relief - Continue Metoprolol 25 mg po BID and Pravastatin 40 mg po Daily; hold ARB Surgery  -Added back NTG 0.4 mg SL PRN CP and C/w Isosorbide Mononitrate 15 mg po Daily   CKD stage III  - SCr is 1.72 on admission, up from priors in 1.3-range, though most recent chem panel is from 1 year ago  - Appears euvolemic on admission  - Hold ARB as above, follow fluid-status closely - BUN/Cr went from 50/1.72 -> 38/1.67 -> 36/1.67 - Cr may increase as patient was Hypotensive during the Surgery  - Repeat CMP in AM   Anxiety  - Stable.  -Continue Buspirone 15 mg po TID and Clonazepam 0.5 mg po BID    Dementia  - Stable. C/w Donepezil 10 mg po qHS  Normocytic Anemia  - Hgb is 10.8 on admission  - No S/Sx of Bleeding; Hb/Hct went from 10.8/32.9 -> 9.8/30.9 -> 10.6/32.6 - Patient is Typed and Screened  - Expect Post-Operative drop - Continue to Monitor and repeat CBC in AM   GERD -C/w Pantoprazole 40 mg po Daily   Essential Hypertension -C/w Amlodipine 2.5 mg po Daily, Metoprolol 25 mg po BID, and Furosemide 120 mg po BID -Holding ACEi/ARB today; Was Hypotensive likely during the Surgery and consider restarting in AM  Hypokalemia, improved -Patient's K+ was 2.8 and improved to 3.6 -C/w Home 20 mEQ pO BID -Continue to Monitor and Replete as Necessary -Repeat CMP In AM   DVT prophylaxis: SCD's; Defer to  Ortho Service to start DVT Prophylaxis. Lovenox 30 mg sq q24h to start 12-24 hours postoperatively  Code Status: FULL CODE Family Communication: Discussed with family at bedside Disposition Plan: Remain Inpatient for Right Hip Hemiarthroplasty in AM  Consultants:   Orthopedic Surgery Dr. Nicholos Johnsogers/Dr.Xu   Procedures:  Prosthetic replacement for femoral neck fracture from an Anterior Approach POD 0.   Antimicrobials:  Anti-infectives (From admission, onward)   Start     Dose/Rate Route Frequency Ordered Stop   02/12/17 0600  ceFAZolin (ANCEF) IVPB 2g/100 mL premix     2 g 200 mL/hr over 30 Minutes Intravenous On call to O.R. 02/11/17 1625 02/13/17 0559     Subjective: Seen and examined at bedside after surgery this AM and stated she had no pain. She was sleepy but in NAD. Denied any SOB or Chest Pain. No other concerns or complaints at this time.   Objective: Vitals:   02/11/17 1500 02/11/17 2001 02/11/17 2044 02/12/17 0620  BP: (!) 121/46 (!) 161/58  (!) 147/43  Pulse: 68 62  67  Resp: 20     Temp: 98 F (36.7 C) 98.4 F (36.9 C)  99 F (37.2 C)  TempSrc: Oral Oral  Oral  SpO2: 97% 97% 96% 94%  Weight:    79.5 kg (175 lb 4.3 oz)  Height:        Intake/Output Summary (Last 24 hours) at 02/12/2017 0930 Last data filed at 02/12/2017 1610 Gross per 24 hour  Intake 1000 ml  Output 2200 ml  Net -1200 ml   Filed Weights   02/10/17 1603 02/11/17 0140 02/12/17 0620  Weight: 74.6 kg (164 lb 6 oz) 79.4 kg (175 lb 0.7 oz) 79.5 kg (175 lb 4.3 oz)   Examination: Physical Exam:  Constitutional: WN/WD obese Caucasian female in NAD appears calm and somnolent but easily arousable  Eyes: Sclerae anicteric. Lids normal ENMT: External ears and nose appear normal. MMM Neck: Supple with no JVD Respiratory: Diminished to auscultation bilaterally; No appreciable wheezing/rales/crackles. Normal respiratory effort and wearing supplemental O2 via [place] Cardiovascular: RRR; Mild LE  edema Abdomen: Soft, NT, Abdomen distended because of obese habitus. Bowel sounds present GU: Deferred Musculoskeletal:  No contractures; no cyanosis Skin: No rashes or lesions on a limited skin eval. Right Hip incisions appear C/D/I Neurologic: CN 2-12 grossly intact. No appreciable focal deficits Psychiatric: Normal mood and affect. Intact judgement and insight. Sleepy but alert.   Data Reviewed: I have personally reviewed following labs and imaging studies  CBC: Recent Labs  Lab 02/10/17 1615 02/11/17 1031 02/12/17 0314  WBC 4.8 6.1 7.5  NEUTROABS 3.8 4.8 6.2  HGB 10.8* 9.8* 10.6*  HCT 32.9* 30.9* 32.6*  MCV 88.2 87.8 88.6  PLT 155 135* 147*   Basic Metabolic Panel: Recent Labs  Lab 02/10/17 1615 02/11/17 1031 02/12/17 0314  NA 139 137 139  K 3.7 2.8* 3.6  CL 94* 94* 94*  CO2 29 32 35*  GLUCOSE 135* 156* 146*  BUN 50* 38* 36*  CREATININE 1.72* 1.67* 1.67*  CALCIUM 9.8 8.7* 9.0  MG  --  1.9 1.9  PHOS  --  3.7 3.3   GFR: Estimated Creatinine Clearance: 27.3 mL/min (A) (by C-G formula based on SCr of 1.67 mg/dL (H)). Liver Function Tests: Recent Labs  Lab 02/11/17 1031 02/12/17 0314  AST 20 18  ALT 15 13*  ALKPHOS 37* 40  BILITOT 0.7 0.7  PROT 6.5 7.1  ALBUMIN 3.9 4.0   No results for input(s): LIPASE, AMYLASE in the last 168 hours. No results for input(s): AMMONIA in the last 168 hours. Coagulation Profile: Recent Labs  Lab 02/10/17 1615  INR 1.00   Cardiac Enzymes: No results for input(s): CKTOTAL, CKMB, CKMBINDEX, TROPONINI in the last 168 hours. BNP (last 3 results) Recent Labs    02/19/16 1121  PROBNP 7,074*   HbA1C: No results for input(s): HGBA1C in the last 72 hours. CBG: No results for input(s): GLUCAP in the last 168 hours. Lipid Profile: No results for input(s): CHOL, HDL, LDLCALC, TRIG, CHOLHDL, LDLDIRECT in the last 72 hours. Thyroid Function Tests: No results for input(s): TSH, T4TOTAL, FREET4, T3FREE, THYROIDAB in the last 72  hours. Anemia Panel: No results for input(s): VITAMINB12, FOLATE, FERRITIN, TIBC, IRON, RETICCTPCT in the last 72 hours. Sepsis Labs: No results for input(s): PROCALCITON, LATICACIDVEN in the last 168 hours.  Recent Results (from the past 240 hour(s))  Surgical pcr screen     Status: None   Collection Time: 02/11/17  1:04 AM  Result Value Ref Range Status   MRSA, PCR NEGATIVE NEGATIVE Final   Staphylococcus aureus NEGATIVE NEGATIVE Final    Comment: (NOTE) The Xpert SA Assay (FDA approved for NASAL specimens in  patients 56 years of age and older), is one component of a comprehensive surveillance program. It is not intended to diagnose infection nor to guide or monitor treatment.     Radiology Studies: Dg Chest 1 View  Result Date: 02/10/2017 CLINICAL DATA:  Fall. Preoperative respiratory evaluation for hip fracture. EXAM: CHEST 1 VIEW COMPARISON:  09/19/2016 FINDINGS: The lungs are clear without focal pneumonia, edema, pneumothorax or pleural effusion. Hyperexpansion suggests emphysema. Interstitial markings are diffusely coarsened with chronic features. The cardio pericardial silhouette is enlarged. Multiple bilateral old rib fractures evident. Telemetry leads overlie the chest. IMPRESSION: Cardiomegaly with emphysema.  No acute cardiopulmonary findings. Electronically Signed   By: Kennith Center M.D.   On: 02/10/2017 18:17   Dg Hip Unilat With Pelvis 2-3 Views Right  Result Date: 02/10/2017 CLINICAL DATA:  Preop.  Fall with right hip pain. EXAM: DG HIP (WITH OR WITHOUT PELVIS) 2-3V RIGHT COMPARISON:  12/16/2015 FINDINGS: AP view the pelvis and AP/frog leg views of the right hip. Comminuted, impacted subcapital or mid right femoral neck fracture with minimal varus angulation. No dislocation. Sacroiliac joints are symmetric. Support apparatus overlies the left hemipelvis, without gross left-sided abnormality identified. IMPRESSION: Right femoral neck fracture with impaction and mild varus  angulation. Electronically Signed   By: Jeronimo Greaves M.D.   On: 02/10/2017 18:21   Scheduled Meds: . [MAR Hold] amLODipine  2.5 mg Oral Daily  . [MAR Hold] busPIRone  15 mg Oral TID  . [MAR Hold] cholecalciferol  1,000 Units Oral Daily  . [MAR Hold] clonazePAM  0.5 mg Oral BID  . [MAR Hold] donepezil  10 mg Oral QHS  . [MAR Hold] feeding supplement (ENSURE ENLIVE)  237 mL Oral Q1500  . [MAR Hold] furosemide  120 mg Oral BID  . [MAR Hold] isosorbide mononitrate  15 mg Oral Daily  . [MAR Hold] metolazone  2.5 mg Oral Once per day on Tue Sat  . [MAR Hold] metoprolol tartrate  25 mg Oral BID  . [MAR Hold] mometasone-formoterol  2 puff Inhalation BID  . [MAR Hold] pantoprazole  40 mg Oral Daily  . [MAR Hold] potassium chloride SA  20 mEq Oral BID  . povidone-iodine  2 application Topical Once  . [MAR Hold] pravastatin  40 mg Oral q1800  . [MAR Hold] sertraline  100 mg Oral Daily   Continuous Infusions: .  ceFAZolin (ANCEF) IV    . tranexamic acid (CYKLOKAPRON) topical -INTRAOP       LOS: 2 days   Merlene Laughter, DO [facility] Pager [phone]  If 7PM-7AM, please contact night-coverage [email or web address] Password [email or web address] 02/12/2017, 9:30 AM

## 2017-02-12 NOTE — Anesthesia Postprocedure Evaluation (Signed)
Anesthesia Post Note  Patient: [name]  Procedure(s) Performed: ANTERIOR APPROACH HEMI HIP ARTHROPLASTY (Right Hip)     Patient location during evaluation: PACU Anesthesia Type: General Level of consciousness: awake and alert Pain management: pain level controlled Vital Signs Assessment: post-procedure vital signs reviewed and stable Respiratory status: spontaneous breathing, nonlabored ventilation, respiratory function stable and patient connected to nasal cannula oxygen Cardiovascular status: blood pressure returned to baseline and stable Postop Assessment: no apparent nausea or vomiting Anesthetic complications: no    Last Vitals:  Vitals:   02/12/17 1016 02/12/17 1017  BP:  (!) 129/49  Pulse: 63 64  Resp: 16   Temp:  36.5 C  SpO2: 95% 96%    Last Pain:  Vitals:   02/12/17 0620  TempSrc: Oral  PainSc:                  Nehemias Sauceda S

## 2017-02-12 NOTE — Transfer of Care (Signed)
Immediate Anesthesia Transfer of Care Note  Patient: [name]  Procedure(s) Performed: ANTERIOR APPROACH HEMI HIP ARTHROPLASTY (Right Hip)  Patient Location: PACU  Anesthesia Type:General  Level of Consciousness: awake, alert  and oriented  Airway & Oxygen Therapy: Patient Spontanous Breathing and Patient connected to nasal cannula oxygen  Post-op Assessment: Report given to RN and Post -op Vital signs reviewed and stable  Post vital signs: Reviewed and stable  Last Vitals:  Vitals:   02/11/17 2044 02/12/17 0620  BP:  (!) 147/43  Pulse:  67  Resp:    Temp:  37.2 C  SpO2: 96% 94%   HR 70, RR 17, sats 99%, bP 151/49  Last Pain:  Vitals:   02/12/17 0620  TempSrc: Oral  PainSc:           Complications: No apparent anesthesia complications

## 2017-02-12 NOTE — Progress Notes (Signed)
Orthopedic Tech Progress Note Patient Details:  [name] 1935/07/17 [id number]  Patient ID: [name], female   DOB: [birth year], 81 y.o.   MRN: [id number] Pt cant have ohf due to age restrictions  Trinna Post 02/12/2017, 10:23 PM

## 2017-02-12 NOTE — Plan of Care (Signed)
Bed alarm on and in working order

## 2017-02-12 NOTE — Anesthesia Preprocedure Evaluation (Addendum)
Anesthesia Evaluation  Patient identified by MRN, date of birth, ID band Patient awake    Reviewed: Allergy & Precautions, NPO status , Patient's Chart, lab work & pertinent test results  Airway Mallampati: II  TM Distance: >3 FB Neck ROM: Full    Dental no notable dental hx. (+) Edentulous Upper, Dental Advisory Given   Pulmonary neg pulmonary ROS, COPD (4L O2),  oxygen dependent, former smoker,    Pulmonary exam normal breath sounds clear to auscultation       Cardiovascular hypertension, + CAD, + Cardiac Stents and + Peripheral Vascular Disease  Normal cardiovascular exam Rhythm:Regular Rate:Normal     Neuro/Psych  Headaches, Anxiety negative neurological ROS  negative psych ROS   GI/Hepatic negative GI ROS, Neg liver ROS,   Endo/Other  negative endocrine ROS  Renal/GU Renal InsufficiencyRenal disease  negative genitourinary   Musculoskeletal negative musculoskeletal ROS (+)   Abdominal   Peds negative pediatric ROS (+)  Hematology  (+) anemia ,   Anesthesia Other Findings   Reproductive/Obstetrics negative OB ROS                            Anesthesia Physical Anesthesia Plan  ASA: III and emergent  Anesthesia Plan: General   Post-op Pain Management:    Induction: Intravenous  PONV Risk Score and Plan: 2 and Ondansetron, Dexamethasone and Treatment may vary due to age or medical condition  Airway Management Planned: Oral ETT  Additional Equipment:   Intra-op Plan:   Post-operative Plan: Extubation in OR  Informed Consent: I have reviewed the patients History and Physical, chart, labs and discussed the procedure including the risks, benefits and alternatives for the proposed anesthesia with the patient or authorized representative who has indicated his/her understanding and acceptance.   Dental advisory given  Plan Discussed with: CRNA and Surgeon  Anesthesia Plan  Comments:         Anesthesia Quick Evaluation

## 2017-02-12 NOTE — Plan of Care (Signed)
Patients bed alarm is on and working, room is by the nurses station

## 2017-02-12 NOTE — Anesthesia Procedure Notes (Signed)
Procedure Name: Intubation Date/Time: 02/12/2017 7:36 AM Performed by: Wilburn Cornelia, CRNA Pre-anesthesia Checklist: Patient identified, Emergency Drugs available, Suction available, Patient being monitored and Timeout performed Patient Re-evaluated:Patient Re-evaluated prior to induction Oxygen Delivery Method: Circle system utilized Preoxygenation: Pre-oxygenation with 100% oxygen Induction Type: IV induction Ventilation: Mask ventilation without difficulty Laryngoscope Size: Mac and 3 Grade View: Grade I Tube type: Oral Tube size: 7.0 mm Number of attempts: 1 Airway Equipment and Method: Stylet Placement Confirmation: ETT inserted through vocal cords under direct vision,  positive ETCO2,  CO2 detector and breath sounds checked- equal and bilateral Secured at: 21 cm Tube secured with: Tape Dental Injury: Teeth and Oropharynx as per pre-operative assessment

## 2017-02-13 ENCOUNTER — Encounter: Payer: Self-pay | Admitting: Family Medicine

## 2017-02-13 ENCOUNTER — Encounter (HOSPITAL_COMMUNITY): Payer: Self-pay | Admitting: Orthopaedic Surgery

## 2017-02-13 ENCOUNTER — Telehealth: Payer: Self-pay | Admitting: Family Medicine

## 2017-02-13 LAB — COMPREHENSIVE METABOLIC PANEL
ALK PHOS: 37 U/L — AB (ref 38–126)
ALT: 10 U/L — AB (ref 14–54)
AST: 20 U/L (ref 15–41)
Albumin: 3.5 g/dL (ref 3.5–5.0)
Anion gap: 17 — ABNORMAL HIGH (ref 5–15)
BILIRUBIN TOTAL: 0.6 mg/dL (ref 0.3–1.2)
BUN: 41 mg/dL — AB (ref 6–20)
CALCIUM: 7.8 mg/dL — AB (ref 8.9–10.3)
CHLORIDE: 88 mmol/L — AB (ref 101–111)
CO2: 31 mmol/L (ref 22–32)
CREATININE: 1.96 mg/dL — AB (ref 0.44–1.00)
GFR, EST AFRICAN AMERICAN: 27 mL/min — AB (ref >60)
GFR, EST NON AFRICAN AMERICAN: 23 mL/min — AB (ref >60)
Glucose, Bld: 149 mg/dL — ABNORMAL HIGH (ref 65–99)
Potassium: 3 mmol/L — ABNORMAL LOW (ref 3.5–5.1)
Sodium: 136 mmol/L (ref 135–145)
TOTAL PROTEIN: 6.4 g/dL — AB (ref 6.5–8.1)

## 2017-02-13 LAB — CBC WITH DIFFERENTIAL/PLATELET
BASOS ABS: 0 10*3/uL (ref 0.0–0.1)
BLASTS: 0 %
Band Neutrophils: 0 %
Basophils Relative: 0 %
EOS PCT: 0 %
Eosinophils Absolute: 0 10*3/uL (ref 0.0–0.7)
HEMATOCRIT: 25.8 % — AB (ref 36.0–46.0)
HEMOGLOBIN: 8.5 g/dL — AB (ref 12.0–15.0)
LYMPHS ABS: 0.8 10*3/uL (ref 0.7–4.0)
Lymphocytes Relative: 10 %
MCH: 28.9 pg (ref 26.0–34.0)
MCHC: 32.9 g/dL (ref 30.0–36.0)
MCV: 87.8 fL (ref 78.0–100.0)
METAMYELOCYTES PCT: 0 %
MYELOCYTES: 0 %
Monocytes Absolute: 0.1 10*3/uL (ref 0.1–1.0)
Monocytes Relative: 1 %
NEUTROS PCT: 89 %
NRBC: 0 /100{WBCs}
Neutro Abs: 6.9 10*3/uL (ref 1.7–7.7)
Other: 0 %
PLATELETS: 150 10*3/uL (ref 150–400)
Promyelocytes Absolute: 0 %
RBC: 2.94 MIL/uL — AB (ref 3.87–5.11)
RDW: 14.1 % (ref 11.5–15.5)
WBC: 7.8 10*3/uL (ref 4.0–10.5)

## 2017-02-13 LAB — MAGNESIUM: MAGNESIUM: 1.8 mg/dL (ref 1.7–2.4)

## 2017-02-13 LAB — PHOSPHORUS: PHOSPHORUS: 3.6 mg/dL (ref 2.5–4.6)

## 2017-02-13 MED ORDER — BISACODYL 10 MG RE SUPP
10.0000 mg | Freq: Every day | RECTAL | Status: DC
  Filled 2017-02-13: qty 1

## 2017-02-13 MED ORDER — POTASSIUM CHLORIDE CRYS ER 20 MEQ PO TBCR
40.0000 meq | EXTENDED_RELEASE_TABLET | Freq: Once | ORAL | Status: AC
  Administered 2017-02-13: 40 meq via ORAL
  Filled 2017-02-13: qty 2

## 2017-02-13 MED ORDER — FUROSEMIDE 40 MG PO TABS
120.0000 mg | ORAL_TABLET | Freq: Two times a day (BID) | ORAL | Status: DC
Start: 2017-02-13 — End: 2017-02-14
  Administered 2017-02-13: 120 mg via ORAL
  Filled 2017-02-13: qty 3

## 2017-02-13 NOTE — NC FL2 (Signed)
[place] MEDICAID FL2 LEVEL OF CARE SCREENING TOOL     IDENTIFICATION  Patient Name: [name] Birthdate: 1935/05/09 Sex: female Admission Date (Current Location): 02/10/2017  [facility] [place]   Facility and Address:  [facility] [place], [address], [place], [place] [address]      Provider Number: [id number]  Attending Physician Name and Address:  Calvert Cantorizwan, Saima, MD  Relative Name and Phone Number:       Current Level of Care: Hospital Recommended Level of Care: Skilled Nursing Facility Prior Approval Number:    Date Approved/Denied:   PASRR Number: [id number]  Discharge Plan: SNF    Current Diagnoses: Patient Active Problem List   Diagnosis Date Noted  . Hypokalemia 02/11/2017  . Anxiety 02/10/2017  . Closed right hip fracture, initial encounter (HCC) 02/10/2017  . Closed fracture of right hip (HCC)   . Chronic anxiety 08/23/2016  . CHF (congestive heart failure) (HCC) 02/05/2016  . Elevated troponin 01/13/2016  . Multiple fractures of ribs, right side, initial encounter for closed fracture 12/03/2015  . Rib fractures 12/03/2015  . Chronic respiratory failure (HCC) 12/03/2015  . COPD (chronic obstructive pulmonary disease) (HCC) 12/03/2015  . Senile purpura (HCC) 09/07/2015  . Benign essential tremor 09/07/2015  . Chronic diastolic congestive heart failure (HCC) 04/20/2015  . Renal insufficiency 04/25/2014  . Chronic respiratory failure with hypoxia (HCC) 04/11/2014  . CKD (chronic kidney disease) stage 3, GFR 30-59 ml/min (HCC) 04/11/2014  . Prediabetes 12/03/2013  . Hypertrophic obstructive cardiomyopathy (HCC) 11/09/2013  . Hypoxia 08/28/2013  . Respiratory failure with hypoxia (HCC) 05/07/2013  . COPD exacerbation (HCC) 02/15/2013  . Hyperlipidemia 11/09/2010  . CAD (coronary artery disease) 09/18/2009  . BRADYCARDIA 08/19/2009  . Dementia 07/19/2007  . Essential hypertension 07/19/2007  . CAROTID  ARTERY STENOSIS 07/19/2007  . ASTHMA 07/19/2007  . COPD with asthma (HCC) 07/19/2007  . HEADACHE, CHRONIC, HX OF 07/19/2007    Orientation RESPIRATION BLADDER Height & Weight     Self, Place  O2(3L [place]) Continent Weight: 180 lb 8.9 oz (81.9 kg) Height:  5\' 3"  (160 cm)  BEHAVIORAL SYMPTOMS/MOOD NEUROLOGICAL BOWEL NUTRITION STATUS      Continent Diet(see DC summary)  AMBULATORY STATUS COMMUNICATION OF NEEDS Skin   Limited Assist Verbally Surgical wounds(right hip- hydrocolloid dressing)                       Personal Care Assistance Level of Assistance  Bathing, Dressing Bathing Assistance: Maximum assistance   Dressing Assistance: Maximum assistance     Functional Limitations Info             SPECIAL CARE FACTORS FREQUENCY  PT (By licensed PT), OT (By licensed OT)     PT Frequency: 5/wk OT Frequency: 5/wk            Contractures      Additional Factors Info  Code Status, Allergies, Psychotropic Code Status Info: DNR Allergies Info: Amoxil Amoxicillin, Codeine, Dilaudid Hydromorphone Hcl, Erythromycin, Sulfonamide Derivatives Psychotropic Info: buspar, klonopin, zoloft         Current Medications (02/13/2017):  This is the current hospital active medication list Current Facility-Administered Medications  Medication Dose Route Frequency Provider Last Rate Last Dose  . acetaminophen (TYLENOL) tablet 650 mg  650 mg Oral Q6H PRN Tarry KosXu, Naiping M, MD   650 mg at 02/13/17 1341   Or  . acetaminophen (TYLENOL) suppository 650 mg  650 mg Rectal Q6H PRN  Tarry Kos, MD      . albuterol (PROVENTIL) (2.5 MG/3ML) 0.083% nebulizer solution 2.5 mg  2.5 mg Nebulization Q4H PRN Opyd, Lavone Neri, MD   2.5 mg at 02/12/17 0718  . alum & mag hydroxide-simeth (MAALOX/MYLANTA) 200-200-20 MG/5ML suspension 30 mL  30 mL Oral Q4H PRN Tarry Kos, MD      . amLODipine (NORVASC) tablet 2.5 mg  2.5 mg Oral Daily Opyd, Lavone Neri, MD   2.5 mg at 02/13/17 0956  . bisacodyl (DULCOLAX)  suppository 10 mg  10 mg Rectal Daily Rizwan, Ladell Heads, MD      . busPIRone (BUSPAR) tablet 15 mg  15 mg Oral TID Briscoe Deutscher, MD   15 mg at 02/13/17 0956  . cholecalciferol (VITAMIN D) tablet 1,000 Units  1,000 Units Oral Daily Opyd, Lavone Neri, MD   1,000 Units at 02/13/17 [phone]  . clonazePAM (KLONOPIN) tablet 0.5 mg  0.5 mg Oral BID Opyd, Lavone Neri, MD   0.5 mg at 02/13/17 0956  . donepezil (ARICEPT) tablet 10 mg  10 mg Oral QHS Opyd, Lavone Neri, MD   10 mg at 02/12/17 2210  . enoxaparin (LOVENOX) injection 30 mg  30 mg Subcutaneous Q24H Tarry Kos, MD   30 mg at 02/13/17 0955  . feeding supplement (ENSURE ENLIVE) (ENSURE ENLIVE) liquid 237 mL  237 mL Oral Q1500 Sheikh, Omair Latif, DO      . furosemide (LASIX) tablet 120 mg  120 mg Oral BID Calvert Cantor, MD      . HYDROcodone-acetaminophen (NORCO/VICODIN) 5-325 MG per tablet 1-2 tablet  1-2 tablet Oral Q6H PRN Tarry Kos, MD   1 tablet at 02/13/17 [phone]  . ipratropium (ATROVENT) nebulizer solution 0.5 mg  0.5 mg Nebulization Q6H PRN Opyd, Lavone Neri, MD   0.5 mg at 02/10/17 2239  . isosorbide mononitrate (IMDUR) 24 hr tablet 15 mg  15 mg Oral Daily Opyd, Lavone Neri, MD      . menthol-cetylpyridinium (CEPACOL) lozenge 3 mg  1 lozenge Oral PRN Tarry Kos, MD       Or  . phenol (CHLORASEPTIC) mouth spray 1 spray  1 spray Mouth/Throat PRN Tarry Kos, MD      . methocarbamol (ROBAXIN) tablet 500 mg  500 mg Oral Q6H PRN Tarry Kos, MD   500 mg at 02/12/17 2214   Or  . methocarbamol (ROBAXIN) 500 mg in dextrose 5 % 50 mL IVPB  500 mg Intravenous Q6H PRN Tarry Kos, MD      . metoCLOPramide (REGLAN) tablet 5-10 mg  5-10 mg Oral Q8H PRN Tarry Kos, MD       Or  . metoCLOPramide (REGLAN) injection 5-10 mg  5-10 mg Intravenous Q8H PRN Tarry Kos, MD      . metolazone (ZAROXOLYN) tablet 2.5 mg  2.5 mg Oral Once per day on Tue Sat Opyd, Timothy S, MD   2.5 mg at 02/11/17 1122  . metoprolol tartrate (LOPRESSOR) tablet 25 mg  25 mg Oral  BID Briscoe Deutscher, MD   25 mg at 02/13/17 0955  . mometasone-formoterol (DULERA) 200-5 MCG/ACT inhaler 2 puff  2 puff Inhalation BID Opyd, Lavone Neri, MD   2 puff at 02/13/17 0804  . morphine 2 MG/ML injection 0.5 mg  0.5 mg Intravenous Q2H PRN Tarry Kos, MD      . nitroGLYCERIN (NITROSTAT) SL tablet 0.4 mg  0.4 mg Sublingual PRN Marguerita Merles Latif, DO      .  ondansetron (ZOFRAN) tablet 4 mg  4 mg Oral Q6H PRN Tarry Kos, MD       Or  . ondansetron [facility]) injection 4 mg  4 mg Intravenous Q6H PRN Tarry Kos, MD      . oxyCODONE (Oxy IR/ROXICODONE) immediate release tablet 5-10 mg  5-10 mg Oral Q4H PRN Tarry Kos, MD      . pantoprazole (PROTONIX) EC tablet 40 mg  40 mg Oral Daily Opyd, Lavone Neri, MD   40 mg at 02/13/17 0956  . polyethylene glycol (MIRALAX / GLYCOLAX) packet 17 g  17 g Oral Daily PRN Opyd, Lavone Neri, MD   17 g at 02/13/17 1341  . potassium chloride SA (K-DUR,KLOR-CON) CR tablet 20 mEq  20 mEq Oral BID Briscoe Deutscher, MD   20 mEq at 02/12/17 2212  . pravastatin (PRAVACHOL) tablet 40 mg  40 mg Oral q1800 Opyd, Lavone Neri, MD   40 mg at 02/12/17 1709  . senna-docusate (Senokot-S) tablet 1 tablet  1 tablet Oral QHS PRN Opyd, Lavone Neri, MD      . sertraline (ZOLOFT) tablet 100 mg  100 mg Oral Daily Opyd, Lavone Neri, MD   100 mg at 02/13/17 0957  . tranexamic acid (CYKLOKAPRON) 2,000 mg in sodium chloride 0.9 % 50 mL Topical Application  2,000 mg Topical Once Tarry Kos, MD         Discharge Medications: Please see discharge summary for a list of discharge medications.  Relevant Imaging Results:  Relevant Lab Results:   Additional Information SS#: [id number]  Burna Sis, LCSW

## 2017-02-13 NOTE — Telephone Encounter (Signed)
Hospice called stating that the pt fell over the weekend and was admitted to [facility]. Pt had a partial hip replacement due to this. Pt will be discharged to SNF at discharge.

## 2017-02-13 NOTE — Progress Notes (Signed)
   Subjective:  Patient reports pain as mild.  Feels much better  Objective:   VITALS:   Vitals:   02/12/17 1027 02/12/17 2023 02/12/17 2101 02/13/17 0516  BP: (!) 148/46 (!) 140/52  (!) 132/42  Pulse: 62 71  71  Resp:  16  16  Temp: 97.8 F (36.6 C) 97.7 F (36.5 C)  98.3 F (36.8 C)  TempSrc: Oral Oral  Oral  SpO2: 94% 97% 97% 99%  Weight:      Height:        Neurologically intact Neurovascular intact Sensation intact distally Intact pulses distally Dorsiflexion/Plantar flexion intact Incision: dressing C/D/I and no drainage No cellulitis present Compartment soft   Lab Results  Component Value Date   WBC 7.5 02/12/2017   HGB 10.6 (L) 02/12/2017   HCT 32.6 (L) 02/12/2017   MCV 88.6 02/12/2017   PLT 147 (L) 02/12/2017     Assessment/Plan:  1 Day Post-Op   - Expected postop acute blood loss anemia - will monitor for symptoms - Up with PT/OT - DVT ppx - SCDs, ambulation, lovenox - WBAT operative extremity, no hip precautions - Pain control - Discharge planning  Glee Arvin 02/13/2017, 7:26 AM [phone]

## 2017-02-13 NOTE — Progress Notes (Addendum)
PROGRESS NOTE    [name]   MIW:[id number]  DOB: 01-24-1936  DOA: 02/10/2017 PCP: Babs Sciara, MD   Brief Narrative:  [name] is a 81 y.o.femalewith medical history significant foremphysema with chronic hypoxic respiratory failure, coronary artery disease, chronic diastolic CHF, anxiety disorder, dementia, and hypertension, and other comorbids now presenting to the emergency department with severe right hip pain after patient reports that she had been in her usual state until tripping over her oxygen tubing, landing on her right side, and experiencing immediate and severe pain at the right hip.   Subjective: She is feeling well right now. No complains. Eating and drinking well.  ROS: no complaints of nausea, vomiting, constipation diarrhea, cough, dyspnea or dysuria. No other complaints.   Assessment & Plan:   Principal Problem:   Closed right hip fracture, initial encounter  - s/o surgery- cont to follow for PT needs- management per surgery  Active Problems:    Chronic diastolic congestive heart failure  - cont Lasix and follow  Drop in Hb noted today - ? Due to fracture- follow closely    COPD (chronic obstructive pulmonary disease) - currently no wheezing or distress- stable    Anxiety/ demtia - cont home meds    Hypokalemia - has been replaced    Chronic respiratory failure with hypoxia - cont follow     CKD (chronic kidney disease) stage 3, GFR 30-59 ml/min - mild rise in Cr today  DVT prophylaxis: per ortho Code Status: DNR Family Communication: husband Disposition Plan:  Consultants:   ortho Procedures:  Prosthetic replacement for femoral neck fracture from an Anterior Approach POD 0.    Antimicrobials:  Anti-infectives (From admission, onward)   Start     Dose/Rate Route Frequency Ordered Stop   02/12/17 0945  ceFAZolin (ANCEF) IVPB 2g/100 mL premix     2 g 200 mL/hr over 30 Minutes Intravenous Every 6 hours 02/12/17 0932  02/13/17 0344   02/12/17 0600  ceFAZolin (ANCEF) IVPB 2g/100 mL premix  Status:  Discontinued     2 g 200 mL/hr over 30 Minutes Intravenous On call to O.R. 02/11/17 1625 02/12/17 0932       Objective: Vitals:   02/13/17 0516 02/13/17 0804 02/13/17 0955 02/13/17 1330  BP: (!) 132/42   (!) 135/47  Pulse: 71   74  Resp: 16   16  Temp: 98.3 F (36.8 C)   98.3 F (36.8 C)  TempSrc: Oral   Oral  SpO2: 99% 97%  93%  Weight:   81.9 kg (180 lb 8.9 oz)   Height:        Intake/Output Summary (Last 24 hours) at 02/13/2017 1857 Last data filed at 02/13/2017 0518 Gross per 24 hour  Intake 240 ml  Output 1850 ml  Net -1610 ml   Filed Weights   02/11/17 0140 02/12/17 0620 02/13/17 0955  Weight: 79.4 kg (175 lb 0.7 oz) 79.5 kg (175 lb 4.3 oz) 81.9 kg (180 lb 8.9 oz)    Examination: General exam: Appears comfortable  HEENT: PERRLA, oral mucosa moist, no sclera icterus or thrush Respiratory system: Clear to auscultation. Respiratory effort normal. Cardiovascular system: S1 & S2 heard, RRR.  No murmurs  Gastrointestinal system: Abdomen soft, non-tender, nondistended. Normal bowel sound. No organomegaly Central nervous system: Alert and oriented. No focal neurological deficits. Extremities: No cyanosis, clubbing or edema Skin: No rashes or ulcers Psychiatry:  Mood & affect appropriate.     Data Reviewed: I have  personally reviewed following labs and imaging studies  CBC: Recent Labs  Lab 02/10/17 1615 02/11/17 1031 02/12/17 0314 02/13/17 0609  WBC 4.8 6.1 7.5 7.8  NEUTROABS 3.8 4.8 6.2 6.9  HGB 10.8* 9.8* 10.6* 8.5*  HCT 32.9* 30.9* 32.6* 25.8*  MCV 88.2 87.8 88.6 87.8  PLT 155 135* 147* 150   Basic Metabolic Panel: Recent Labs  Lab 02/10/17 1615 02/11/17 1031 02/12/17 0314 02/13/17 0609  NA 139 137 139 136  K 3.7 2.8* 3.6 3.0*  CL 94* 94* 94* 88*  CO2 29 32 35* 31  GLUCOSE 135* 156* 146* 149*  BUN 50* 38* 36* 41*  CREATININE 1.72* 1.67* 1.67* 1.96*  CALCIUM  9.8 8.7* 9.0 7.8*  MG  --  1.9 1.9 1.8  PHOS  --  3.7 3.3 3.6   GFR: Estimated Creatinine Clearance: 23.6 mL/min (A) (by C-G formula based on SCr of 1.96 mg/dL (H)). Liver Function Tests: Recent Labs  Lab 02/11/17 1031 02/12/17 0314 02/13/17 0609  AST 20 18 20   ALT 15 13* 10*  ALKPHOS 37* 40 37*  BILITOT 0.7 0.7 0.6  PROT 6.5 7.1 6.4*  ALBUMIN 3.9 4.0 3.5   No results for input(s): LIPASE, AMYLASE in the last 168 hours. No results for input(s): AMMONIA in the last 168 hours. Coagulation Profile: Recent Labs  Lab 02/10/17 1615  INR 1.00   Cardiac Enzymes: No results for input(s): CKTOTAL, CKMB, CKMBINDEX, TROPONINI in the last 168 hours. BNP (last 3 results) Recent Labs    02/19/16 1121  PROBNP 7,074*   HbA1C: No results for input(s): HGBA1C in the last 72 hours. CBG: No results for input(s): GLUCAP in the last 168 hours. Lipid Profile: No results for input(s): CHOL, HDL, LDLCALC, TRIG, CHOLHDL, LDLDIRECT in the last 72 hours. Thyroid Function Tests: No results for input(s): TSH, T4TOTAL, FREET4, T3FREE, THYROIDAB in the last 72 hours. Anemia Panel: No results for input(s): VITAMINB12, FOLATE, FERRITIN, TIBC, IRON, RETICCTPCT in the last 72 hours. Urine analysis:    Component Value Date/Time   COLORURINE YELLOW 12/03/2015 1010   APPEARANCEUR CLEAR 12/03/2015 1010   LABSPEC 1.025 12/03/2015 1010   PHURINE 6.0 12/03/2015 1010   GLUCOSEU NEGATIVE 12/03/2015 1010   HGBUR NEGATIVE 12/03/2015 1010   BILIRUBINUR NEGATIVE 12/03/2015 1010   KETONESUR NEGATIVE 12/03/2015 1010   PROTEINUR NEGATIVE 12/03/2015 1010   UROBILINOGEN 0.2 04/13/2014 1200   NITRITE NEGATIVE 12/03/2015 1010   LEUKOCYTESUR NEGATIVE 12/03/2015 1010   Sepsis Labs: @LABRCNTIP (procalcitonin:4,lacticidven:4) ) Recent Results (from the past 240 hour(s))  Surgical pcr screen     Status: None   Collection Time: 02/11/17  1:04 AM  Result Value Ref Range Status   MRSA, PCR NEGATIVE NEGATIVE Final     Staphylococcus aureus NEGATIVE NEGATIVE Final    Comment: (NOTE) The Xpert SA Assay (FDA approved for NASAL specimens in patients 67 years of age and older), is one component of a comprehensive surveillance program. It is not intended to diagnose infection nor to guide or monitor treatment.          Radiology Studies: Pelvis Portable  Result Date: 02/12/2017 CLINICAL DATA:  Anterior right hip replacement. EXAM: PORTABLE PELVIS 1-2 VIEWS COMPARISON:  Intraoperative images same date. Preoperative exam of 02/10/2017. FINDINGS: Interval right hip arthroplasty without hardware complication or periprosthetic fracture. Left femoral head is located. Sacroiliac joints are symmetric. Expected postoperative soft tissue gas and lateral surgical staples. IMPRESSION: Expected appearance after right hip arthroplasty. Electronically Signed   By: Jeronimo Greaves  M.D.   On: 02/12/2017 10:31   Dg C-arm 1-60 Min  Result Date: 02/12/2017 CLINICAL DATA:  Femoral neck fracture EXAM: DG C-ARM 61-120 MIN; OPERATIVE RIGHT HIP WITH PELVIS COMPARISON:  02/10/2017 FINDINGS: Right hip hemiarthroplasty has been placed. There is anatomic alignment of the osseous and prosthetic structures. No breakage or loosening of the hardware. IMPRESSION: Right hip hemiarthroplasty anatomically aligned. Electronically Signed   By: Jolaine ClickArthur  Hoss M.D.   On: 02/12/2017 10:28   Dg Hip Operative Unilat W Or W/o Pelvis Right  Result Date: 02/12/2017 CLINICAL DATA:  Femoral neck fracture EXAM: DG C-ARM 61-120 MIN; OPERATIVE RIGHT HIP WITH PELVIS COMPARISON:  02/10/2017 FINDINGS: Right hip hemiarthroplasty has been placed. There is anatomic alignment of the osseous and prosthetic structures. No breakage or loosening of the hardware. IMPRESSION: Right hip hemiarthroplasty anatomically aligned. Electronically Signed   By: Jolaine ClickArthur  Hoss M.D.   On: 02/12/2017 10:28      Scheduled Meds: . amLODipine  2.5 mg Oral Daily  . bisacodyl  10 mg  Rectal Daily  . busPIRone  15 mg Oral TID  . cholecalciferol  1,000 Units Oral Daily  . clonazePAM  0.5 mg Oral BID  . donepezil  10 mg Oral QHS  . enoxaparin (LOVENOX) injection  30 mg Subcutaneous Q24H  . feeding supplement (ENSURE ENLIVE)  237 mL Oral Q1500  . furosemide  120 mg Oral BID  . isosorbide mononitrate  15 mg Oral Daily  . metolazone  2.5 mg Oral Once per day on Tue Sat  . metoprolol tartrate  25 mg Oral BID  . mometasone-formoterol  2 puff Inhalation BID  . pantoprazole  40 mg Oral Daily  . potassium chloride SA  20 mEq Oral BID  . pravastatin  40 mg Oral q1800  . sertraline  100 mg Oral Daily   Continuous Infusions: . methocarbamol (ROBAXIN)  IV    . tranexamic acid (CYKLOKAPRON) topical -INTRAOP       LOS: 3 days    Time spent in minutes: 35    Calvert CantorSaima Ayush Boulet, MD [facility] Pager: [email or web address] Password [email or web address] 02/13/2017, 6:57 PM

## 2017-02-13 NOTE — Telephone Encounter (Signed)
Thanks-Im aware

## 2017-02-13 NOTE — Evaluation (Addendum)
Physical Therapy Evaluation Patient Details Name: [name] MRN: [id number] DOB: February 24, 1935 Today's Date: 02/13/2017   History of Present Illness  Pt is a 81 y/o female with a PMH significant for emphysema with chronic hypoxic respiratory failure, CAD, CHF, dementia, HTN. She presents s/p fall after tripping over her oxygen tubing and landing on her R side. She sustained a R femoral neck fracture and is now s/p R hip hemiarthroplasty (anterior approach) on 02/12/17.  Clinical Impression  Pt admitted with above diagnosis. Pt currently with functional limitations due to the deficits listed below (see PT Problem List). At the time of PT eval pt was limited by pain and DOE. She is hopeful for return home with family support at d/c, however at this time I am concerned that she will not have the tolerance for functional activity or strength to negotiate stairs to safely enter home. Pt is agreeable to SNF if she does not improve enough to safely return home. Will continue to assess for most appropriate d/c disposition at next session. Acutely, pt will benefit from skilled PT to increase their independence and safety with mobility to allow discharge to the venue listed below.       Follow Up Recommendations SNF;Supervision/Assistance - 24 hour    Equipment Recommendations  None recommended by PT    Recommendations for Other Services       Precautions / Restrictions Precautions Precautions: Fall Precaution Comments: on 3-4 L supplemental O2 at baseline per pt report Restrictions Weight Bearing Restrictions: Yes RLE Weight Bearing: Weight bearing as tolerated      Mobility  Bed Mobility Overal bed mobility: Needs Assistance Bed Mobility: Supine to Sit     Supine to sit: Min assist     General bed mobility comments: Assist for advancement of RLE to EOB. Increased time and use of rails required.   Transfers Overall transfer level: Needs assistance Equipment used: Rolling walker  (2 wheeled);1 person hand held assist Transfers: Sit to/from [employer] Sit to Stand: Mod assist Stand pivot transfers: Mod assist       General transfer comment: Mod A to power-up to full standing position with RW. At end of session, pt SPT chair to bedside commode with therapist assist only (no AD), and mod assist as well.   Ambulation/Gait Ambulation/Gait assistance: Min assist Ambulation Distance (Feet): 10 Feet Assistive device: Rolling walker (2 wheeled) Gait Pattern/deviations: Step-to pattern;Decreased stride length;Trunk flexed;Narrow base of support Gait velocity: Decreased Gait velocity interpretation: Below normal speed for age/gender General Gait Details: Pt taking very slow, very small steps with RW. She was able to ambulate around the bed but had to sit at the foot of the bed when we got to it - unable to ambulate a few more feet to get to the chair.   Stairs            Wheelchair Mobility    Modified Rankin (Stroke Patients Only)       Balance Overall balance assessment: Needs assistance Sitting-balance support: Feet supported;No upper extremity supported Sitting balance-Leahy Scale: Fair     Standing balance support: Bilateral upper extremity supported;During functional activity Standing balance-Leahy Scale: Poor Standing balance comment: Reliant on UE support at this time.                              Pertinent Vitals/Pain Pain Assessment: 0-10 Pain Score: 4  Pain Location: R hip Pain Descriptors / Indicators:  Operative site guarding;Aching Pain Intervention(s): Limited activity within patient's tolerance;Monitored during session;Repositioned    Home Living Family/patient expects to be discharged to:: Private residence Living Arrangements: Spouse/significant other Available Help at Discharge: Family;Available 24 hours/day Type of Home: House Home Access: Stairs to enter Entrance Stairs-Rails: Left Entrance  Stairs-Number of Steps: 5 in the front; 10 in the back Home Layout: One level Home Equipment: Emergency planning/management officerhower seat;Walker - 2 wheels;Cane - single point;Bedside commode      Prior Function Level of Independence: Independent with assistive device(s)         Comments: Occasionally used the RW for support when out of the house. States she was doing all the cooking and household chores     Hand Dominance   Dominant Hand: Right    Extremity/Trunk Assessment   Upper Extremity Assessment Upper Extremity Assessment: Defer to OT evaluation    Lower Extremity Assessment Lower Extremity Assessment: RLE deficits/detail RLE Deficits / Details: Acute pain, decreased strength and AROM consistent with above mentioned procedure.  RLE: Unable to fully assess due to pain    Cervical / Trunk Assessment Cervical / Trunk Assessment: Other exceptions Cervical / Trunk Exceptions: Forward head/rounded shoulder posture  Communication   Communication: No difficulties  Cognition Arousal/Alertness: Awake/alert Behavior During Therapy: WFL for tasks assessed/performed Overall Cognitive Status: Within Functional Limits for tasks assessed                                        General Comments      Exercises     Assessment/Plan    PT Assessment Patient needs continued PT services  PT Problem List Decreased strength;Decreased range of motion;Decreased balance;Decreased activity tolerance;Decreased mobility;Decreased knowledge of use of DME;Decreased safety awareness;Decreased knowledge of precautions;Pain       PT Treatment Interventions DME instruction;Gait training;Stair training;Functional mobility training;Therapeutic activities;Therapeutic exercise;Neuromuscular re-education;Patient/family education    PT Goals (Current goals can be found in the Care Plan section)  Acute Rehab PT Goals Patient Stated Goal: Go home at d/c PT Goal Formulation: With patient Time For Goal  Achievement: 02/27/17 Potential to Achieve Goals: Good    Frequency Min 3X/week   Barriers to discharge        Co-evaluation               AM-PAC PT "6 Clicks" Daily Activity  Outcome Measure Difficulty turning over in bed (including adjusting bedclothes, sheets and blankets)?: Unable Difficulty moving from lying on back to sitting on the side of the bed? : Unable Difficulty sitting down on and standing up from a chair with arms (e.g., wheelchair, bedside commode, etc,.)?: Unable Help needed moving to and from a bed to chair (including a wheelchair)?: A Little Help needed walking in hospital room?: A Little Help needed climbing 3-5 steps with a railing? : A Lot 6 Click Score: 11    End of Session Equipment Utilized During Treatment: Gait belt;Oxygen Activity Tolerance: Patient limited by pain;Patient limited by fatigue Patient left: with call bell/phone within reach;Other (comment)(on BSC) Nurse Communication: Mobility status;Other (comment)(Pt sitting on BSC and will hit call bell when finished) PT Visit Diagnosis: Unsteadiness on feet (R26.81);History of falling (Z91.81);Pain Pain - Right/Left: Right Pain - part of body: Hip    Time: 0812-0845 PT Time Calculation (min) (ACUTE ONLY): 33 min   Charges:   PT Evaluation $PT Eval Moderate Complexity: 1 Mod PT Treatments $Gait Training: 8-22  mins   PT G Codes:        [name], PT, DPT Acute Rehabilitation Services Pager: [phone]   [name] 02/13/2017, 9:25 AM

## 2017-02-13 NOTE — Progress Notes (Addendum)
[facility] RN Visit.  This is a related and covered GIP admission of 02/10/2017 with [facility] diagnosis of CHF per Dr. Anne Fu. Code status DNR.  Patient fell at home and fractured right hip. EMS was activated and patient was transported to [facility] ED for evaluation.  She had surgery 02/12/17.  Hospice was not notified.  ? Visited patient at bedside. She is alert and oriented and there is family at bedside. Patient does not appear in distress. She reports increased pain after PT.  She is receiving O2 3L [place]. IV saline lock in right forearm.   Medications PRNS; Robaxin 500mg  PO Q6hrs or Robaxin 500mg /39ml IVPB q6hrs for muscle spasms Vicoden 5-325mg  1-2 tablets Q6hrs given at [id number].   Dr. Gibson Ramp and Dr. Gerda Diss notified of admission. Transfer Summary and medication list placed on shadow chart.   Will continue to follow while hospitalized and anticipate discharge needs.   Thank you  Elsie Saas RN [facility] Liaison  [phone]   All Hospital Liaisons are on AMION

## 2017-02-13 NOTE — Evaluation (Signed)
Occupational Therapy Evaluation Patient Details Name: [name] Ma[place] MRN: [id number] DOB: 1935/10/21 Today's Date: 02/13/2017    History of Present Illness Pt is a 81 y/o female with a PMH significant for emphysema with chronic hypoxic respiratory failure, CAD, CHF, dementia, HTN. She presents s/p fall after tripping over her oxygen tubing and landing on her R side. She sustained a R femoral neck fracture and is now s/p R hip hemiarthroplasty (anterior approach) on 02/12/17.   Clinical Impression   Pt with decline in function and safety with ADLs and ADL mobility with decreased strength, balance and endurance. Pt would benefit from acute OT services to address impairments to maximize level of function and safety    Follow Up Recommendations  SNF;Supervision/Assistance - 24 hour    Equipment Recommendations  None recommended by OT(TBD at next venue of care)    Recommendations for Other Services       Precautions / Restrictions Precautions Precautions: Fall Precaution Comments: on 3-4 L supplemental O2 at baseline per pt report Restrictions Weight Bearing Restrictions: Yes RLE Weight Bearing: Weight bearing as tolerated      Mobility Bed Mobility Overal bed mobility: Needs Assistance Bed Mobility: Supine to Sit     Supine to sit: Min assist     General bed mobility comments: Assist for advancement of RLE to EOB. Increased time and use of rails required.   Transfers Overall transfer level: Needs assistance Equipment used: Rolling walker (2 wheeled);1 person hand held assist Transfers: Sit to/from [employer];Stand Pivot Transfers Sit to Stand: Mod assist Stand pivot transfers: Mod assist       General transfer comment: Mod A to power-up to full standing position with RW.     Balance Overall balance assessment: Needs assistance Sitting-balance support: Feet supported;No upper extremity supported Sitting balance-Leahy Scale: Fair     Standing balance support:  Bilateral upper extremity supported;During functional activity Standing balance-Leahy Scale: Poor                             ADL either performed or assessed with clinical judgement   ADL Overall ADL's : Needs assistance/impaired Eating/Feeding: Independent;Sitting   Grooming: Wash/dry hands;Wash/dry face;Min guard;Sitting   Upper Body Bathing: Minimal assistance;Sitting   Lower Body Bathing: Maximal assistance   Upper Body Dressing : Minimal assistance;Sitting   Lower Body Dressing: Total assistance   Toilet Transfer: Moderate assistance;Ambulation;RW;Cueing for safety;Cueing for sequencing   Toileting- Clothing Manipulation and Hygiene: Total assistance       Functional mobility during ADLs: Moderate assistance;Rolling walker;Cueing for safety       Vision Baseline Vision/History: Wears glasses Wears Glasses: Reading only Patient Visual Report: No change from baseline       Perception     Praxis      Pertinent Vitals/Pain Pain Assessment: 0-10 Pain Score: 4  Pain Location: R hip Pain Descriptors / Indicators: Operative site guarding;Aching Pain Intervention(s): Limited activity within patient's tolerance;Monitored during session;Repositioned;Premedicated before session     Hand Dominance Right   Extremity/Trunk Assessment Upper Extremity Assessment Upper Extremity Assessment: Overall WFL for tasks assessed   Lower Extremity Assessment Lower Extremity Assessment: Defer to PT evaluation   Cervical / Trunk Assessment Cervical / Trunk Assessment: Other exceptions Cervical / Trunk Exceptions: Forward head/rounded shoulder posture   Communication Communication Communication: No difficulties   Cognition Arousal/Alertness: Awake/alert Behavior During Therapy: WFL for tasks assessed/performed Overall Cognitive Status: Within Functional Limits for tasks assessed  General Comments   pt pleasant  and cooperative    Exercises     Shoulder Instructions      Home Living Family/patient expects to be discharged to:: Private residence Living Arrangements: Spouse/significant other Available Help at Discharge: Family;Available 24 hours/day Type of Home: House Home Access: Stairs to enter [employer] of Steps: 5 in the front; 10 in the back Entrance Stairs-Rails: Left Home Layout: One level     Bathroom Shower/Tub: Chief Strategy Officer: Standard     Home Equipment: Emergency planning/management officer - 2 wheels;Cane - single point;Bedside commode          Prior Functioning/Environment Level of Independence: Independent with assistive device(s)        Comments: Occasionally used the RW for support when out of the house. States she was doing all the cooking and household chores        OT Problem List: Decreased strength;Decreased activity tolerance;Decreased knowledge of use of DME or AE;Pain;Impaired balance (sitting and/or standing)      OT Treatment/Interventions: Self-care/ADL training;DME and/or AE instruction;Therapeutic activities;Patient/family education    OT Goals(Current goals can be found in the care plan section) Acute Rehab OT Goals Patient Stated Goal: get better and go home OT Goal Formulation: With patient/family Time For Goal Achievement: 03/30/17 Potential to Achieve Goals: Good ADL Goals Pt Will Perform Grooming: with set-up;with supervision;sitting Pt Will Perform Upper Body Bathing: with min guard assist;sitting Pt Will Perform Lower Body Bathing: with mod assist;sitting/lateral leans Pt Will Perform Upper Body Dressing: with supervision;with set-up;sitting Pt Will Transfer to Toilet: with min assist;ambulating;bedside commode;regular height toilet Pt Will Perform Toileting - Clothing Manipulation and hygiene: with max assist;sit to/from stand  OT Frequency: Min 2X/week   Barriers to D/C: Decreased caregiver support           Co-evaluation              AM-PAC PT "6 Clicks" Daily Activity     Outcome Measure Help from another person eating meals?: None Help from another person taking care of personal grooming?: A Little Help from another person toileting, which includes using toliet, bedpan, or urinal?: Total Help from another person bathing (including washing, rinsing, drying)?: A Lot Help from another person to put on and taking off regular upper body clothing?: A Little Help from another person to put on and taking off regular lower body clothing?: Total 6 Click Score: 14   End of Session Equipment Utilized During Treatment: Gait belt;Rolling walker  Activity Tolerance: Patient limited by pain Patient left: in chair;with call bell/phone within reach;with family/visitor present  OT Visit Diagnosis: Unsteadiness on feet (R26.81);Other abnormalities of gait and mobility (R26.89);Muscle weakness (generalized) (M62.81);History of falling (Z91.81);Pain Pain - Right/Left: Right Pain - part of body: Hip;Leg                Time: 7001-7494 OT Time Calculation (min): 29 min Charges:  OT General Charges $OT Visit: 1 Visit OT Evaluation $OT Eval Moderate Complexity: 1 Mod OT Treatments $Therapeutic Activity: 8-22 mins G-Codes: OT G-codes **NOT FOR INPATIENT CLASS** Functional Assessment Tool Used: AM-PAC 6 Clicks Daily Activity     Galen Manila 02/13/2017, 2:15 PM

## 2017-02-14 ENCOUNTER — Inpatient Hospital Stay (HOSPITAL_COMMUNITY)

## 2017-02-14 DIAGNOSIS — D62 Acute posthemorrhagic anemia: Secondary | ICD-10-CM

## 2017-02-14 DIAGNOSIS — Z96642 Presence of left artificial hip joint: Secondary | ICD-10-CM

## 2017-02-14 DIAGNOSIS — N179 Acute kidney failure, unspecified: Secondary | ICD-10-CM

## 2017-02-14 LAB — CBC
HCT: 23.3 % — ABNORMAL LOW (ref 36.0–46.0)
Hemoglobin: 7.8 g/dL — ABNORMAL LOW (ref 12.0–15.0)
MCH: 29 pg (ref 26.0–34.0)
MCHC: 33.5 g/dL (ref 30.0–36.0)
MCV: 86.6 fL (ref 78.0–100.0)
PLATELETS: 141 10*3/uL — AB (ref 150–400)
RBC: 2.69 MIL/uL — AB (ref 3.87–5.11)
RDW: 14.1 % (ref 11.5–15.5)
WBC: 6.2 10*3/uL (ref 4.0–10.5)

## 2017-02-14 LAB — BASIC METABOLIC PANEL
Anion gap: 16 — ABNORMAL HIGH (ref 5–15)
BUN: 51 mg/dL — ABNORMAL HIGH (ref 6–20)
CALCIUM: 8 mg/dL — AB (ref 8.9–10.3)
CO2: 32 mmol/L (ref 22–32)
CREATININE: 2.19 mg/dL — AB (ref 0.44–1.00)
Chloride: 89 mmol/L — ABNORMAL LOW (ref 101–111)
GFR, EST AFRICAN AMERICAN: 23 mL/min — AB (ref >60)
GFR, EST NON AFRICAN AMERICAN: 20 mL/min — AB (ref >60)
Glucose, Bld: 153 mg/dL — ABNORMAL HIGH (ref 65–99)
Potassium: 3.3 mmol/L — ABNORMAL LOW (ref 3.5–5.1)
SODIUM: 137 mmol/L (ref 135–145)

## 2017-02-14 MED ORDER — POTASSIUM CHLORIDE CRYS ER 20 MEQ PO TBCR
40.0000 meq | EXTENDED_RELEASE_TABLET | Freq: Once | ORAL | Status: AC
  Administered 2017-02-14: 20 meq via ORAL
  Filled 2017-02-14: qty 2

## 2017-02-14 MED ORDER — BUSPIRONE HCL 15 MG PO TABS
15.0000 mg | ORAL_TABLET | Freq: Three times a day (TID) | ORAL | Status: DC
  Administered 2017-02-14 – 2017-02-17 (×8): 15 mg via ORAL
  Filled 2017-02-14 (×4): qty 1
  Filled 2017-02-14: qty 3
  Filled 2017-02-14 (×2): qty 1
  Filled 2017-02-14 (×2): qty 3
  Filled 2017-02-14: qty 1
  Filled 2017-02-14 (×2): qty 3
  Filled 2017-02-14 (×2): qty 1
  Filled 2017-02-14 (×2): qty 3
  Filled 2017-02-14: qty 1

## 2017-02-14 NOTE — Clinical Social Work Note (Signed)
Clinical Social Work Assessment  Patient Details  Name: [name] MRN: [id number] Date of Birth: Apr 10, 1935  Date of referral:  02/13/17               Reason for consult:  Facility Placement                Permission sought to share information with:  Facility Sport and exercise psychologist, Family Supports Permission granted to share information::  Yes, Verbal Permission Granted  Name::     Naval architect::  SNF  Relationship::  Granddaughter/HCPOA  Contact Information:     Housing/Transportation Living arrangements for the past 2 months:  Single Family Home Source of Information:  Adult Children Patient Interpreter Needed:  None Criminal Activity/Legal Involvement Pertinent to Current Situation/Hospitalization:  No - Comment as needed Significant Relationships:  Adult Children, Spouse, Other Family Members Lives with:  Self, Spouse Do you feel safe going back to the place where you live?  Yes Need for family participation in patient care:  Yes (Comment)(patient's granddaughter is POA)  Care giving concerns:  Patient lives at home with spouse and has been receiving hospice services at home. Patient will need short term rehab to improve mobility and ability to care for self with spouse's support before returning home.    Social Worker assessment / plan:  CSW met with patient and patient's daughter at bedside to discuss recommendation for SNF and referral process. CSW explained that in order for Medicare to cover patient's SNF placement, the patient's hospice benefit would need to be revoked. CSW confirmed with patient's granddaughter that the family was aware and they had started that process. CSW spoke with admissions at [facility] to discuss referral and need to revoke hospice. CSW will continue to follow.  Employment status:  Retired Forensic scientist:  Medicare PT Recommendations:  [place] / Referral to community resources:  [place]  Patient/Family's Response to care:  Patient and family agreeable to SNF placement.  Patient/Family's Understanding of and Emotional Response to Diagnosis, Current Treatment, and Prognosis:  Patient deferred response to CSW questions and wanted her daughter to answer and discuss, but did acknowledge that she was agreeable and would do whatever she needed to do. Patient's daughter at bedside discussed that the patient's granddaughter and POA is the best person to contact, and got her on the phone. Patient's daughter and granddaughter appreciated information on [facility] referral being made and acknowledged that they were aware that they would have to revoke the patient's hospice. Patient's granddaughter indicated that she had already been in contact with the hospice caseworker to begin the paperwork.   Emotional Assessment Appearance:  Appears stated age Attitude/Demeanor/Rapport:    Affect (typically observed):  Appropriate Orientation:  Oriented to Self, Oriented to Place, Oriented to Situation Alcohol / Substance use:  Not Applicable Psych involvement (Current and /or in the community):  No (Comment)  Discharge Needs  Concerns to be addressed:  Care Coordination Readmission within the last 30 days:  No Current discharge risk:  Physical Impairment, Dependent with Mobility Barriers to Discharge:  Continued Medical Work up, [place], [place] 02/14/2017, 8:57 AM

## 2017-02-14 NOTE — Plan of Care (Signed)
  Progressing Education: Knowledge of General Education information will improve 02/14/2017 1654 - Progressing by Cordie Grice, RN Health Behavior/Discharge Planning: Ability to manage health-related needs will improve 02/14/2017 1654 - Progressing by Cordie Grice, RN Clinical Measurements: Ability to maintain clinical measurements within normal limits will improve 02/14/2017 1654 - Progressing by Cordie Grice, RN Will remain free from infection 02/14/2017 1654 - Progressing by Cordie Grice, RN Diagnostic test results will improve 02/14/2017 1654 - Progressing by Cordie Grice, RN Respiratory complications will improve 02/14/2017 1654 - Progressing by Cordie Grice, RN Cardiovascular complication will be avoided 02/14/2017 1654 - Progressing by Cordie Grice, RN Activity: Risk for activity intolerance will decrease 02/14/2017 1654 - Progressing by Cordie Grice, RN Nutrition: Adequate nutrition will be maintained 02/14/2017 1654 - Progressing by Cordie Grice, RN Coping: Level of anxiety will decrease 02/14/2017 1654 - Progressing by Cordie Grice, RN Elimination: Will not experience complications related to bowel motility 02/14/2017 1654 - Progressing by Cordie Grice, RN Will not experience complications related to urinary retention 02/14/2017 1654 - Progressing by Cordie Grice, RN Pain Managment: General experience of comfort will improve 02/14/2017 1654 - Progressing by Cordie Grice, RN Safety: Ability to remain free from injury will improve 02/14/2017 1654 - Progressing by Cordie Grice, RN Skin Integrity: Risk for impaired skin integrity will decrease 02/14/2017 1654 - Progressing by Cordie Grice, RN Education: Knowledge of the prescribed therapeutic regimen will improve 02/14/2017 1654 - Progressing by Cordie Grice, RN Understanding of discharge needs will improve 02/14/2017 1654 - Progressing by Cordie Grice, RN Activity: Ability to avoid complications of mobility impairment will improve 02/14/2017 1654 - Progressing by Cordie Grice, RN Ability to tolerate increased activity will improve 02/14/2017 1654 - Progressing by Cordie Grice, RN Clinical Measurements: Postoperative complications will be avoided or minimized 02/14/2017 1654 - Progressing by Cordie Grice, RN Pain Management: Pain level will decrease with appropriate interventions 02/14/2017 1654 - Progressing by Cordie Grice, RN Skin Integrity: Signs of wound healing will improve 02/14/2017 1654 - Progressing by Cordie Grice, RN

## 2017-02-14 NOTE — Progress Notes (Signed)
PROGRESS NOTE    [name]   ZOX:[id number]  DOB: 1935-09-25  DOA: 02/10/2017 PCP: Babs Sciara, MD   Brief Narrative:  [name] is a 82 y.o.femalewith medical history significant foremphysema with chronic hypoxic respiratory failure, coronary artery disease, chronic diastolic CHF, anxiety disorder, dementia, and hypertension, and other comorbids now presenting to the emergency department with severe right hip pain after patient reports that she had been in her usual state until tripping over her oxygen tubing, landing on her right side, and experiencing immediate and severe pain at the right hip.   Subjective: She has no complaints of dyspnea today. She admits to increased pain in her hip today due to stiffness.  ROS: no complaints of nausea, vomiting, constipation diarrhea, cough, dyspnea or dysuria. No other complaints.   Assessment & Plan:   Principal Problem:   Closed right hip fracture, initial encounter  - s/o surgery- cont to follow for PT needs- management per surgery  Active Problems:    Chronic diastolic congestive heart failure  - creatine is rising over the past 2 days  - suspect dehydration in setting of surgery and acute blood loss- will d/c Lasix today- have discussed with family in detail who have been very argumentative about holding it- I have had extensive conversations with them about the need to hold it today  Drop in Hb noted today - ? Due to fracture- Hb today is 7.8- rechecking     COPD (chronic obstructive pulmonary disease) - currently no wheezing or distress- stable    Anxiety/ dementia - cont home meds    Hypokalemia - has been replaced    Chronic respiratory failure with hypoxia - cont follow     CKD (chronic kidney disease) stage 3, GFR 30-59 ml/min - mild rise in Cr today  DVT prophylaxis: per ortho Code Status: DNR Family Communication: husband Disposition Plan:  Consultants:   ortho Procedures:  Prosthetic  replacement for femoral neck fracture from an Anterior Approach POD 0.  Antimicrobials:  Anti-infectives (From admission, onward)   Start     Dose/Rate Route Frequency Ordered Stop   02/12/17 0945  ceFAZolin (ANCEF) IVPB 2g/100 mL premix     2 g 200 mL/hr over 30 Minutes Intravenous Every 6 hours 02/12/17 0932 02/13/17 0344   02/12/17 0600  ceFAZolin (ANCEF) IVPB 2g/100 mL premix  Status:  Discontinued     2 g 200 mL/hr over 30 Minutes Intravenous On call to O.R. 02/11/17 1625 02/12/17 0932     Objective: Vitals:   02/14/17 0453 02/14/17 0909 02/14/17 1300 02/14/17 1338  BP: (!) 159/51   (!) 124/48  Pulse: 73   71  Resp: 16   18  Temp: 99 F (37.2 C)   98.2 F (36.8 C)  TempSrc: Oral   Oral  SpO2: 100% 100%  100%  Weight:   75.6 kg (166 lb 10.7 oz)   Height:        Intake/Output Summary (Last 24 hours) at 02/14/2017 1342 Last data filed at 02/14/2017 1300 Gross per 24 hour  Intake -  Output 850 ml  Net -850 ml   Filed Weights   02/12/17 0620 02/13/17 0955 02/14/17 1300  Weight: 79.5 kg (175 lb 4.3 oz) 81.9 kg (180 lb 8.9 oz) 75.6 kg (166 lb 10.7 oz)    Examination: General exam: Appears comfortable  HEENT: PERRLA, oral mucosa moist, no sclera icterus or thrush Respiratory system: Clear to auscultation. Respiratory effort normal. Cardiovascular system: S1 &  S2 heard, RRR.  No murmurs  Gastrointestinal system: Abdomen soft, non-tender, nondistended. Normal bowel sound. No organomegaly Central nervous system: Alert and oriented. No focal neurological deficits. Extremities: No cyanosis, clubbing or edema Skin: No rashes or ulcers Psychiatry:  Mood & affect appropriate.   Data Reviewed: I have personally reviewed following labs and imaging studies  CBC: Recent Labs  Lab 02/10/17 1615 02/11/17 1031 02/12/17 0314 02/13/17 0609 02/14/17 0421  WBC 4.8 6.1 7.5 7.8 6.2  NEUTROABS 3.8 4.8 6.2 6.9  --   HGB 10.8* 9.8* 10.6* 8.5* 7.8*  HCT 32.9* 30.9* 32.6* 25.8* 23.3*    MCV 88.2 87.8 88.6 87.8 86.6  PLT 155 135* 147* 150 141*   Basic Metabolic Panel: Recent Labs  Lab 02/10/17 1615 02/11/17 1031 02/12/17 0314 02/13/17 0609 02/14/17 0421  NA 139 137 139 136 137  K 3.7 2.8* 3.6 3.0* 3.3*  CL 94* 94* 94* 88* 89*  CO2 29 32 35* 31 32  GLUCOSE 135* 156* 146* 149* 153*  BUN 50* 38* 36* 41* 51*  CREATININE 1.72* 1.67* 1.67* 1.96* 2.19*  CALCIUM 9.8 8.7* 9.0 7.8* 8.0*  MG  --  1.9 1.9 1.8  --   PHOS  --  3.7 3.3 3.6  --    GFR: Estimated Creatinine Clearance: 20.3 mL/min (A) (by C-G formula based on SCr of 2.19 mg/dL (H)). Liver Function Tests: Recent Labs  Lab 02/11/17 1031 02/12/17 0314 02/13/17 0609  AST 20 18 20   ALT 15 13* 10*  ALKPHOS 37* 40 37*  BILITOT 0.7 0.7 0.6  PROT 6.5 7.1 6.4*  ALBUMIN 3.9 4.0 3.5   No results for input(s): LIPASE, AMYLASE in the last 168 hours. No results for input(s): AMMONIA in the last 168 hours. Coagulation Profile: Recent Labs  Lab 02/10/17 1615  INR 1.00   Cardiac Enzymes: No results for input(s): CKTOTAL, CKMB, CKMBINDEX, TROPONINI in the last 168 hours. BNP (last 3 results) Recent Labs    02/19/16 1121  PROBNP 7,074*   HbA1C: No results for input(s): HGBA1C in the last 72 hours. CBG: No results for input(s): GLUCAP in the last 168 hours. Lipid Profile: No results for input(s): CHOL, HDL, LDLCALC, TRIG, CHOLHDL, LDLDIRECT in the last 72 hours. Thyroid Function Tests: No results for input(s): TSH, T4TOTAL, FREET4, T3FREE, THYROIDAB in the last 72 hours. Anemia Panel: No results for input(s): VITAMINB12, FOLATE, FERRITIN, TIBC, IRON, RETICCTPCT in the last 72 hours. Urine analysis:    Component Value Date/Time   COLORURINE YELLOW 12/03/2015 1010   APPEARANCEUR CLEAR 12/03/2015 1010   LABSPEC 1.025 12/03/2015 1010   PHURINE 6.0 12/03/2015 1010   GLUCOSEU NEGATIVE 12/03/2015 1010   HGBUR NEGATIVE 12/03/2015 1010   BILIRUBINUR NEGATIVE 12/03/2015 1010   KETONESUR NEGATIVE 12/03/2015  1010   PROTEINUR NEGATIVE 12/03/2015 1010   UROBILINOGEN 0.2 04/13/2014 1200   NITRITE NEGATIVE 12/03/2015 1010   LEUKOCYTESUR NEGATIVE 12/03/2015 1010   Sepsis Labs: @LABRCNTIP (procalcitonin:4,lacticidven:4) ) Recent Results (from the past 240 hour(s))  Surgical pcr screen     Status: None   Collection Time: 02/11/17  1:04 AM  Result Value Ref Range Status   MRSA, PCR NEGATIVE NEGATIVE Final   Staphylococcus aureus NEGATIVE NEGATIVE Final    Comment: (NOTE) The Xpert SA Assay (FDA approved for NASAL specimens in patients 83 years of age and older), is one component of a comprehensive surveillance program. It is not intended to diagnose infection nor to guide or monitor treatment.  Radiology Studies: Dg Chest Port 1 View  Result Date: 02/14/2017 CLINICAL DATA:  Hypoxia EXAM: PORTABLE CHEST 1 VIEW COMPARISON:  02/10/2017 FINDINGS: Cardiac shadow is enlarged but stable. Old rib fractures are noted bilaterally. The lungs are well aerated bilaterally. No focal infiltrate is seen. No sizable effusion is noted. IMPRESSION: No active disease. Electronically Signed   By: Alcide Clever M.D.   On: 02/14/2017 09:53      Scheduled Meds: . amLODipine  2.5 mg Oral Daily  . bisacodyl  10 mg Rectal Daily  . busPIRone  15 mg Oral TID  . cholecalciferol  1,000 Units Oral Daily  . clonazePAM  0.5 mg Oral BID  . donepezil  10 mg Oral QHS  . enoxaparin (LOVENOX) injection  30 mg Subcutaneous Q24H  . feeding supplement (ENSURE ENLIVE)  237 mL Oral Q1500  . isosorbide mononitrate  15 mg Oral Daily  . metoprolol tartrate  25 mg Oral BID  . mometasone-formoterol  2 puff Inhalation BID  . pantoprazole  40 mg Oral Daily  . pravastatin  40 mg Oral q1800  . sertraline  100 mg Oral Daily   Continuous Infusions: . methocarbamol (ROBAXIN)  IV    . tranexamic acid (CYKLOKAPRON) topical -INTRAOP       LOS: 4 days    Time spent in minutes: 35    Calvert Cantor, MD [facility] Pager: [email or web address] Password [email or web address] 02/14/2017, 1:42 PM

## 2017-02-15 ENCOUNTER — Encounter (HOSPITAL_COMMUNITY): Payer: Self-pay | Admitting: General Practice

## 2017-02-15 LAB — BASIC METABOLIC PANEL
Anion gap: 10 (ref 5–15)
BUN: 51 mg/dL — AB (ref 6–20)
CALCIUM: 8.3 mg/dL — AB (ref 8.9–10.3)
CO2: 32 mmol/L (ref 22–32)
CREATININE: 2.18 mg/dL — AB (ref 0.44–1.00)
Chloride: 96 mmol/L — ABNORMAL LOW (ref 101–111)
GFR calc Af Amer: 24 mL/min — ABNORMAL LOW (ref >60)
GFR calc non Af Amer: 20 mL/min — ABNORMAL LOW (ref >60)
GLUCOSE: 133 mg/dL — AB (ref 65–99)
POTASSIUM: 3.4 mmol/L — AB (ref 3.5–5.1)
Sodium: 138 mmol/L (ref 135–145)

## 2017-02-15 LAB — CBC
HCT: 23.1 % — ABNORMAL LOW (ref 36.0–46.0)
Hemoglobin: 7.5 g/dL — ABNORMAL LOW (ref 12.0–15.0)
MCH: 28.7 pg (ref 26.0–34.0)
MCHC: 32.5 g/dL (ref 30.0–36.0)
MCV: 88.5 fL (ref 78.0–100.0)
PLATELETS: 171 10*3/uL (ref 150–400)
RBC: 2.61 MIL/uL — ABNORMAL LOW (ref 3.87–5.11)
RDW: 14.1 % (ref 11.5–15.5)
WBC: 5.7 10*3/uL (ref 4.0–10.5)

## 2017-02-15 LAB — PREPARE RBC (CROSSMATCH)

## 2017-02-15 MED ORDER — SODIUM CHLORIDE 0.9 % IV SOLN
Freq: Once | INTRAVENOUS | Status: AC
  Administered 2017-02-15: 11:00:00 via INTRAVENOUS

## 2017-02-15 MED ORDER — FUROSEMIDE 10 MG/ML IJ SOLN
60.0000 mg | INTRAMUSCULAR | Status: AC
  Administered 2017-02-15 (×2): 60 mg via INTRAVENOUS
  Filled 2017-02-15 (×2): qty 6

## 2017-02-15 MED ORDER — POTASSIUM CHLORIDE CRYS ER 20 MEQ PO TBCR
40.0000 meq | EXTENDED_RELEASE_TABLET | ORAL | Status: AC
  Administered 2017-02-15: 40 meq via ORAL

## 2017-02-15 MED ORDER — IPRATROPIUM-ALBUTEROL 0.5-2.5 (3) MG/3ML IN SOLN: 3.0000 mL | RESPIRATORY_TRACT | Status: DC | PRN

## 2017-02-15 NOTE — Social Work (Addendum)
CSW contacted [facility] to follow up on SNF bed. SNF concerned about the hospice. CSW advised that hospice will be revoked prior to placement.  Previous CSW f/u with family on revoking hospice.  CSW called granddaughter and left message to confirm that hospice has been revoked.  CSW will continue to follow for SNF placement at [facility] when ready.  11:33am: CSW received return call from granddaughter confirming that she signed documents to revoke hospice on 12/31.  Keene Breath, LCSW Clinical Social Worker [phone]

## 2017-02-15 NOTE — Progress Notes (Signed)
PT Cancellation Note  Patient Details Name: [name] MRN: [id number] DOB: [birth year]   Cancelled Treatment:    Reason Eval/Treat Not Completed: Medical issues which prohibited therapy. Pt receiving transfusions currently RN requesting PT re-attempt, will visit later this evening time permitting.    Etta Grandchild, PT, DPT Acute Rehab Services Pager: [phone]     Etta Grandchild 02/15/2017, 2:32 PM

## 2017-02-15 NOTE — Progress Notes (Signed)
Occupational Therapy Treatment Patient Details Name: [name] MRN: [id number] DOB: [birth year] Today's Date: 02/15/2017    History of present illness Pt is a 82 y/o female with a PMH significant for emphysema with chronic hypoxic respiratory failure, CAD, CHF, dementia, HTN. She presents s/p fall after tripping over her oxygen tubing and landing on her R side. She sustained a R femoral neck fracture and is now s/p R hip hemiarthroplasty (anterior approach) on 02/12/17.   OT comments  Pt making good progress with functional goals. OT will continue to follow acutely  Follow Up Recommendations  SNF;Supervision/Assistance - 24 hour    Equipment Recommendations  None recommended by OT    Recommendations for Other Services      Precautions / Restrictions Precautions Precautions: Fall Precaution Comments: on 3-4 L supplemental O2 at baseline per pt report Restrictions Weight Bearing Restrictions: Yes RLE Weight Bearing: Weight bearing as tolerated       Mobility Bed Mobility Overal bed mobility: Needs Assistance Bed Mobility: Supine to Sit;Sit to Supine     Supine to sit: Min assist Sit to supine: Mod assist   General bed mobility comments: Assist for advancement of RLE to EOB and back onto bed. Increased time and use of trapeze and rails required.   Transfers Overall transfer level: Needs assistance Equipment used: Rolling walker (2 wheeled) Transfers: Sit to/from [employer] Sit to Stand: Mod assist;Min assist Stand pivot transfers: Mod assist;Min assist       General transfer comment: Mod A to power-up to full standing position with RW from bed and BSC    Balance Overall balance assessment: Needs assistance Sitting-balance support: Feet supported;No upper extremity supported Sitting balance-Leahy Scale: Fair     Standing balance support: Bilateral upper extremity supported;During functional activity Standing balance-Leahy Scale: Poor                             ADL either performed or assessed with clinical judgement   ADL       Grooming: Wash/dry hands;Wash/dry face;Min guard;Sitting           Upper Body Dressing : Min guard;Sitting       Toilet Transfer: Ambulation;RW;Cueing for safety;Cueing for sequencing;Minimal assistance;BSC   Toileting- Clothing Manipulation and Hygiene: Maximal assistance;Sitting/lateral lean;Sit to/from stand       Functional mobility during ADLs: Rolling walker;Cueing for safety;Moderate assistance;Minimal assistance       Vision Baseline Vision/History: Wears glasses Patient Visual Report: No change from baseline     Perception     Praxis      Cognition Arousal/Alertness: Awake/alert Behavior During Therapy: WFL for tasks assessed/performed Overall Cognitive Status: Within Functional Limits for tasks assessed                                          Exercises     Shoulder Instructions       General Comments      Pertinent Vitals/ Pain       Pain Assessment: 0-10 Pain Score: 5  Pain Location: R hip Pain Descriptors / Indicators: Operative site guarding;Aching Pain Intervention(s): Limited activity within patient's tolerance;Monitored during session;Premedicated before session;Repositioned  Home Living  Prior Functioning/Environment              Frequency  Min 2X/week        Progress Toward Goals  OT Goals(current goals can now be found in the care plan section)  Progress towards OT goals: Progressing toward goals  Acute Rehab OT Goals Patient Stated Goal: get better and go home  Plan Discharge plan remains appropriate    Co-evaluation                 AM-PAC PT "6 Clicks" Daily Activity     Outcome Measure   Help from another person eating meals?: None Help from another person taking care of personal grooming?: A Little Help from another person  toileting, which includes using toliet, bedpan, or urinal?: A Lot Help from another person bathing (including washing, rinsing, drying)?: A Lot Help from another person to put on and taking off regular upper body clothing?: A Little Help from another person to put on and taking off regular lower body clothing?: Total 6 Click Score: 15    End of Session Equipment Utilized During Treatment: Gait belt;Rolling walker  OT Visit Diagnosis: Unsteadiness on feet (R26.81);Other abnormalities of gait and mobility (R26.89);Muscle weakness (generalized) (M62.81);History of falling (Z91.81);Pain Pain - Right/Left: Right Pain - part of body: Hip;Leg   Activity Tolerance Patient tolerated treatment well   Patient Left with family/visitor present;in bed;with bed alarm set   Nurse Communication      Functional Assessment Tool Used: AM-PAC 6 Clicks Daily Activity   Time: 1610-9604 OT Time Calculation (min): 31 min  Charges: OT G-codes **NOT FOR INPATIENT CLASS** Functional Assessment Tool Used: AM-PAC 6 Clicks Daily Activity OT General Charges $OT Visit: 1 Visit OT Treatments $Self Care/Home Management : 8-22 mins $Therapeutic Activity: 8-22 mins     Galen Manila 02/15/2017, 12:53 PM

## 2017-02-15 NOTE — Progress Notes (Signed)
PROGRESS NOTE    [name]   ZOX:[id number]  DOB: Oct 20, 1935  DOA: 02/10/2017 PCP: Babs Sciara, MD   Brief Narrative:  [name] is a 82 y.o.femalewith medical history significant foremphysema with chronic hypoxic respiratory failure, coronary artery disease, chronic diastolic CHF, anxiety disorder, dementia, and hypertension, who is on hospice at home and presented to the emergency department with severe right hip pain after patient reports that she had been in her usual state until tripping over her oxygen tubing, landing on her right side, and experiencing immediate and severe pain at the right hip.   Subjective: She has no complaints of dyspnea. No cough, nausea, vomiting or abdominal pain.  ROS: no complaints of nausea, vomiting, constipation diarrhea, cough, dyspnea or dysuria. No other complaints.   Assessment & Plan:   Principal Problem:   Closed right hip fracture, initial encounter  - s/p surgery- cont to follow PT needs- management per surgery  Active Problems:    Chronic diastolic congestive heart failure / AKI on CKD 3 - creatine noted to be rising on 12/31  - suspect dehydration in setting of surgery and acute blood loss - I dicussed holding the Lasix on 12/31 when I first noted a rise in Cr however, her family became angry and argumentative and did not want to stop it - the following day Cr increased further to 2.19 and I again discussed stopping Lasix - family was very angry and stated that I was trying to kill her- after extensive conversation and reassurance, they agreed to hold it-  I did obtain a CXR to prove to them that she was not fluid overloaded and was actually dehydrated - today Cr has not improved but has not worsened either -  follow Cr which should improve after blood transfusions  Acute blood loss anemia - likely due to fracture- Hb today is 7.2- will give 2 U PRBC with lasix prior to each unit     COPD (chronic obstructive  pulmonary disease) - currently no wheezing or distress- stable    Anxiety/ dementia - cont home meds    Hypokalemia -   replacing    Chronic respiratory failure with hypoxia- on 4 L at home chronically - cont following -will place on continuous pulse ox and follow while transfusing today     DVT prophylaxis: per ortho Code Status: DNR Family Communication: husband Disposition Plan: SNF Consultants:   ortho Procedures:  Prosthetic replacement for femoral neck fracture from an Anterior Approach POD 0.  Antimicrobials:  Anti-infectives (From admission, onward)   Start     Dose/Rate Route Frequency Ordered Stop   02/12/17 0945  ceFAZolin (ANCEF) IVPB 2g/100 mL premix     2 g 200 mL/hr over 30 Minutes Intravenous Every 6 hours 02/12/17 0932 02/13/17 0344   02/12/17 0600  ceFAZolin (ANCEF) IVPB 2g/100 mL premix  Status:  Discontinued     2 g 200 mL/hr over 30 Minutes Intravenous On call to O.R. 02/11/17 1625 02/12/17 0932     Objective: Vitals:   02/14/17 1338 02/14/17 2001 02/15/17 0700 02/15/17 0914  BP: (!) 124/48 (!) 153/49 (!) 143/51   Pulse: 71 70 67   Resp: 18     Temp: 98.2 F (36.8 C) 98.4 F (36.9 C) 98.2 F (36.8 C)   TempSrc: Oral Oral Oral   SpO2: 100% 100% 100% 97%  Weight:      Height:        Intake/Output Summary (Last 24 hours)  at 02/15/2017 1209 Last data filed at 02/15/2017 0700 Gross per 24 hour  Intake 480 ml  Output 150 ml  Net 330 ml   Filed Weights   02/12/17 0620 02/13/17 0955 02/14/17 1300  Weight: 79.5 kg (175 lb 4.3 oz) 81.9 kg (180 lb 8.9 oz) 75.6 kg (166 lb 10.7 oz)    Examination: General exam: Appears comfortable  HEENT: PERRLA, oral mucosa moist, no sclera icterus or thrush Respiratory system: Clear to auscultation. Respiratory effort normal. Cardiovascular system: S1 & S2 heard, RRR.  No murmurs  Gastrointestinal system: Abdomen soft, non-tender, nondistended. Normal bowel sound. No organomegaly Central nervous system: Alert  and oriented. No focal neurological deficits. Extremities: No cyanosis, clubbing or edema Skin: No rashes or ulcers Psychiatry:  Mood & affect appropriate.   Data Reviewed: I have personally reviewed following labs and imaging studies  CBC: Recent Labs  Lab 02/10/17 1615 02/11/17 1031 02/12/17 0314 02/13/17 0609 02/14/17 0421 02/15/17 0443  WBC 4.8 6.1 7.5 7.8 6.2 5.7  NEUTROABS 3.8 4.8 6.2 6.9  --   --   HGB 10.8* 9.8* 10.6* 8.5* 7.8* 7.5*  HCT 32.9* 30.9* 32.6* 25.8* 23.3* 23.1*  MCV 88.2 87.8 88.6 87.8 86.6 88.5  PLT 155 135* 147* 150 141* 171   Basic Metabolic Panel: Recent Labs  Lab 02/11/17 1031 02/12/17 0314 02/13/17 0609 02/14/17 0421 02/15/17 0443  NA 137 139 136 137 138  K 2.8* 3.6 3.0* 3.3* 3.4*  CL 94* 94* 88* 89* 96*  CO2 32 35* 31 32 32  GLUCOSE 156* 146* 149* 153* 133*  BUN 38* 36* 41* 51* 51*  CREATININE 1.67* 1.67* 1.96* 2.19* 2.18*  CALCIUM 8.7* 9.0 7.8* 8.0* 8.3*  MG 1.9 1.9 1.8  --   --   PHOS 3.7 3.3 3.6  --   --    GFR: Estimated Creatinine Clearance: 20.4 mL/min (A) (by C-G formula based on SCr of 2.18 mg/dL (H)). Liver Function Tests: Recent Labs  Lab 02/11/17 1031 02/12/17 0314 02/13/17 0609  AST 20 18 20   ALT 15 13* 10*  ALKPHOS 37* 40 37*  BILITOT 0.7 0.7 0.6  PROT 6.5 7.1 6.4*  ALBUMIN 3.9 4.0 3.5   No results for input(s): LIPASE, AMYLASE in the last 168 hours. No results for input(s): AMMONIA in the last 168 hours. Coagulation Profile: Recent Labs  Lab 02/10/17 1615  INR 1.00   Cardiac Enzymes: No results for input(s): CKTOTAL, CKMB, CKMBINDEX, TROPONINI in the last 168 hours. BNP (last 3 results) Recent Labs    02/19/16 1121  PROBNP 7,074*   HbA1C: No results for input(s): HGBA1C in the last 72 hours. CBG: No results for input(s): GLUCAP in the last 168 hours. Lipid Profile: No results for input(s): CHOL, HDL, LDLCALC, TRIG, CHOLHDL, LDLDIRECT in the last 72 hours. Thyroid Function Tests: No results for  input(s): TSH, T4TOTAL, FREET4, T3FREE, THYROIDAB in the last 72 hours. Anemia Panel: No results for input(s): VITAMINB12, FOLATE, FERRITIN, TIBC, IRON, RETICCTPCT in the last 72 hours. Urine analysis:    Component Value Date/Time   COLORURINE YELLOW 12/03/2015 1010   APPEARANCEUR CLEAR 12/03/2015 1010   LABSPEC 1.025 12/03/2015 1010   PHURINE 6.0 12/03/2015 1010   GLUCOSEU NEGATIVE 12/03/2015 1010   HGBUR NEGATIVE 12/03/2015 1010   BILIRUBINUR NEGATIVE 12/03/2015 1010   KETONESUR NEGATIVE 12/03/2015 1010   PROTEINUR NEGATIVE 12/03/2015 1010   UROBILINOGEN 0.2 04/13/2014 1200   NITRITE NEGATIVE 12/03/2015 1010   LEUKOCYTESUR NEGATIVE 12/03/2015 1010   Sepsis  Labs: @LABRCNTIP (procalcitonin:4,lacticidven:4) ) Recent Results (from the past 240 hour(s))  Surgical pcr screen     Status: None   Collection Time: 02/11/17  1:04 AM  Result Value Ref Range Status   MRSA, PCR NEGATIVE NEGATIVE Final   Staphylococcus aureus NEGATIVE NEGATIVE Final    Comment: (NOTE) The Xpert SA Assay (FDA approved for NASAL specimens in patients 53 years of age and older), is one component of a comprehensive surveillance program. It is not intended to diagnose infection nor to guide or monitor treatment.          Radiology Studies: Dg Chest Port 1 View  Result Date: 02/14/2017 CLINICAL DATA:  Hypoxia EXAM: PORTABLE CHEST 1 VIEW COMPARISON:  02/10/2017 FINDINGS: Cardiac shadow is enlarged but stable. Old rib fractures are noted bilaterally. The lungs are well aerated bilaterally. No focal infiltrate is seen. No sizable effusion is noted. IMPRESSION: No active disease. Electronically Signed   By: Alcide Clever M.D.   On: 02/14/2017 09:53      Scheduled Meds: . amLODipine  2.5 mg Oral Daily  . bisacodyl  10 mg Rectal Daily  . busPIRone  15 mg Oral TID  . cholecalciferol  1,000 Units Oral Daily  . clonazePAM  0.5 mg Oral BID  . donepezil  10 mg Oral QHS  . enoxaparin (LOVENOX) injection  30 mg  Subcutaneous Q24H  . feeding supplement (ENSURE ENLIVE)  237 mL Oral Q1500  . furosemide  60 mg Intravenous Q4H  . isosorbide mononitrate  15 mg Oral Daily  . metoprolol tartrate  25 mg Oral BID  . mometasone-formoterol  2 puff Inhalation BID  . pantoprazole  40 mg Oral Daily  . pravastatin  40 mg Oral q1800  . sertraline  100 mg Oral Daily   Continuous Infusions: . methocarbamol (ROBAXIN)  IV    . tranexamic acid (CYKLOKAPRON) topical -INTRAOP       LOS: 5 days    Time spent in minutes: 35    Calvert Cantor, MD [facility] Pager: [email or web address] Password [facility] 02/15/2017, 12:09 PM

## 2017-02-15 NOTE — Care Management Important Message (Signed)
Important Message  Patient Details  Name: [name] MRN: [id number] Date of Birth: 1935-10-08   Medicare Important Message Given:       Dorena Bodo 02/15/2017, 2:17 PM

## 2017-02-16 LAB — TYPE AND SCREEN
ABO/RH(D): A POS
Antibody Screen: NEGATIVE
Unit division: 0
Unit division: 0

## 2017-02-16 LAB — BASIC METABOLIC PANEL
ANION GAP: 11 (ref 5–15)
BUN: 49 mg/dL — ABNORMAL HIGH (ref 6–20)
CALCIUM: 8.3 mg/dL — AB (ref 8.9–10.3)
CO2: 27 mmol/L (ref 22–32)
Chloride: 101 mmol/L (ref 101–111)
Creatinine, Ser: 1.87 mg/dL — ABNORMAL HIGH (ref 0.44–1.00)
GFR calc Af Amer: 28 mL/min — ABNORMAL LOW (ref >60)
GFR, EST NON AFRICAN AMERICAN: 24 mL/min — AB (ref >60)
Glucose, Bld: 194 mg/dL — ABNORMAL HIGH (ref 65–99)
POTASSIUM: 3.2 mmol/L — AB (ref 3.5–5.1)
SODIUM: 139 mmol/L (ref 135–145)

## 2017-02-16 LAB — BPAM RBC
BLOOD PRODUCT EXPIRATION DATE: 201901242359
BLOOD PRODUCT EXPIRATION DATE: 201901242359
ISSUE DATE / TIME: 201901021227
ISSUE DATE / TIME: 201901021709
Unit Type and Rh: 6200
Unit Type and Rh: 6200

## 2017-02-16 LAB — CBC
HCT: 30.7 % — ABNORMAL LOW (ref 36.0–46.0)
HEMOGLOBIN: 10.3 g/dL — AB (ref 12.0–15.0)
MCH: 28.8 pg (ref 26.0–34.0)
MCHC: 33.6 g/dL (ref 30.0–36.0)
MCV: 85.8 fL (ref 78.0–100.0)
Platelets: 156 10*3/uL (ref 150–400)
RBC: 3.58 MIL/uL — ABNORMAL LOW (ref 3.87–5.11)
RDW: 15.2 % (ref 11.5–15.5)
WBC: 6.4 10*3/uL (ref 4.0–10.5)

## 2017-02-16 MED ORDER — FUROSEMIDE 40 MG PO TABS
120.0000 mg | ORAL_TABLET | Freq: Two times a day (BID) | ORAL | Status: DC
  Administered 2017-02-17: 120 mg via ORAL
  Filled 2017-02-16: qty 3

## 2017-02-16 NOTE — Progress Notes (Signed)
   02/16/17 1300  Clinical Encounter Type  Visited With Patient and family together  Visit Type Initial  Referral From Nurse  Consult/Referral To Chaplain  Spiritual Encounters  Spiritual Needs Prayer  Stress Factors  Patient Stress Factors Health changes  Family Stress Factors Exhausted  Chaplain visited with the PT and discussed her stay in the hospital.  The husband of the PT was very passionate about the staff and the care they have provided.  The PT was happy to have a chaplain visit and shared in the joy and anticipation.

## 2017-02-16 NOTE — Progress Notes (Signed)
PROGRESS NOTE    [name]   ONG:[id number]  DOB: March 12, 1935  DOA: 02/10/2017 PCP: Babs Sciara, MD   Brief Narrative:  [name] is a 82 y.o.femalewith medical history significant foremphysema with chronic hypoxic respiratory failure, coronary artery disease, chronic diastolic CHF, anxiety disorder, dementia, and hypertension, who is on hospice at home and presented to the emergency department with severe right hip pain after patient reports that she had been in her usual state until tripping over her oxygen tubing, landing on her right side, and experiencing immediate and severe pain at the right hip.   Subjective: Pt has no new complaints. No acute issues overnight. POA wants to continue lasix.   Assessment & Plan:   Principal Problem:   Closed right hip fracture, initial encounter  - s/p surgery - PT on board - most likely transition back to SNF next am.  Active Problems:    Chronic diastolic congestive heart failure / AKI on CKD 3 - creatine noted to be rising on 12/31  - suspect dehydration in setting of surgery and acute blood loss - I dicussed holding the Lasix on 12/31 when I first noted a rise in Cr however, her family became angry and argumentative and did not want to stop it - Family particularly POA want to continue lasix. I will continue they are aware that it may hurt patient's kidneys and they are ok with this stating that they dont want her chf to worsen at whatever cost including that of her kidneys getting injured. Pt is end stage and on hospice care. I will continue lasix next am.  Acute blood loss anemia - s/p transfusion    COPD (chronic obstructive pulmonary disease) - currently no wheezing or distress- stable    Anxiety/ dementia - cont home meds    Hypokalemia -   replacing    Chronic respiratory failure with hypoxia- on 4 L at home chronically - d/c continuous pulse ox. - stable     DVT prophylaxis: per ortho Code  Status: DNR Family Communication: husband Disposition Plan: SNF Consultants:   ortho Procedures:  Prosthetic replacement for femoral neck fracture from an Anterior Approach POD 0.  Antimicrobials:  Anti-infectives (From admission, onward)   Start     Dose/Rate Route Frequency Ordered Stop   02/12/17 0945  ceFAZolin (ANCEF) IVPB 2g/100 mL premix     2 g 200 mL/hr over 30 Minutes Intravenous Every 6 hours 02/12/17 0932 02/13/17 0344   02/12/17 0600  ceFAZolin (ANCEF) IVPB 2g/100 mL premix  Status:  Discontinued     2 g 200 mL/hr over 30 Minutes Intravenous On call to O.R. 02/11/17 1625 02/12/17 0932     Objective: Vitals:   02/16/17 0456 02/16/17 0851 02/16/17 0852 02/16/17 1300  BP: (!) 152/55     Pulse: 68 68    Resp: 16 18    Temp: 98.6 F (37 C)     TempSrc: Oral     SpO2: 97% 97% 97%   Weight:    81.9 kg (180 lb 8.9 oz)  Height:        Intake/Output Summary (Last 24 hours) at 02/16/2017 1746 Last data filed at 02/16/2017 1300 Gross per 24 hour  Intake 778 ml  Output -  Net 778 ml   Filed Weights   02/13/17 0955 02/14/17 1300 02/16/17 1300  Weight: 81.9 kg (180 lb 8.9 oz) 75.6 kg (166 lb 10.7 oz) 81.9 kg (180 lb 8.9 oz)  Examination: General exam: Appears comfortable, in nad. HEENT: PERRLA, oral mucosa moist, no sclera icterus or thrush Respiratory system: Clear to auscultation. Respiratory effort normal. Equal chest rise. Cardiovascular system: S1 & S2 heard, RRR.  No murmurs  Gastrointestinal system: Abdomen soft, non-tender, nondistended. Normal bowel sound. No organomegaly Central nervous system: Alert and oriented. No focal neurological deficits. Extremities: No cyanosis, clubbing or edema Skin: No rashes or ulcers, on limited exam. Psychiatry:  Mood & affect appropriate.   Data Reviewed: I have personally reviewed following labs and imaging studies  CBC: Recent Labs  Lab 02/10/17 1615 02/11/17 1031 02/12/17 0314 02/13/17 0609 02/14/17 0421  02/15/17 0443 02/16/17 0903  WBC 4.8 6.1 7.5 7.8 6.2 5.7 6.4  NEUTROABS 3.8 4.8 6.2 6.9  --   --   --   HGB 10.8* 9.8* 10.6* 8.5* 7.8* 7.5* 10.3*  HCT 32.9* 30.9* 32.6* 25.8* 23.3* 23.1* 30.7*  MCV 88.2 87.8 88.6 87.8 86.6 88.5 85.8  PLT 155 135* 147* 150 141* 171 156   Basic Metabolic Panel: Recent Labs  Lab 02/11/17 1031 02/12/17 0314 02/13/17 0609 02/14/17 0421 02/15/17 0443 02/16/17 0903  NA 137 139 136 137 138 139  K 2.8* 3.6 3.0* 3.3* 3.4* 3.2*  CL 94* 94* 88* 89* 96* 101  CO2 32 35* 31 32 32 27  GLUCOSE 156* 146* 149* 153* 133* 194*  BUN 38* 36* 41* 51* 51* 49*  CREATININE 1.67* 1.67* 1.96* 2.19* 2.18* 1.87*  CALCIUM 8.7* 9.0 7.8* 8.0* 8.3* 8.3*  MG 1.9 1.9 1.8  --   --   --   PHOS 3.7 3.3 3.6  --   --   --    GFR: Estimated Creatinine Clearance: 24.7 mL/min (A) (by C-G formula based on SCr of 1.87 mg/dL (H)). Liver Function Tests: Recent Labs  Lab 02/11/17 1031 02/12/17 0314 02/13/17 0609  AST 20 18 20   ALT 15 13* 10*  ALKPHOS 37* 40 37*  BILITOT 0.7 0.7 0.6  PROT 6.5 7.1 6.4*  ALBUMIN 3.9 4.0 3.5   No results for input(s): LIPASE, AMYLASE in the last 168 hours. No results for input(s): AMMONIA in the last 168 hours. Coagulation Profile: Recent Labs  Lab 02/10/17 1615  INR 1.00   Cardiac Enzymes: No results for input(s): CKTOTAL, CKMB, CKMBINDEX, TROPONINI in the last 168 hours. BNP (last 3 results) Recent Labs    02/19/16 1121  PROBNP 7,074*   HbA1C: No results for input(s): HGBA1C in the last 72 hours. CBG: No results for input(s): GLUCAP in the last 168 hours. Lipid Profile: No results for input(s): CHOL, HDL, LDLCALC, TRIG, CHOLHDL, LDLDIRECT in the last 72 hours. Thyroid Function Tests: No results for input(s): TSH, T4TOTAL, FREET4, T3FREE, THYROIDAB in the last 72 hours. Anemia Panel: No results for input(s): VITAMINB12, FOLATE, FERRITIN, TIBC, IRON, RETICCTPCT in the last 72 hours. Urine analysis:    Component Value Date/Time    COLORURINE YELLOW 12/03/2015 1010   APPEARANCEUR CLEAR 12/03/2015 1010   LABSPEC 1.025 12/03/2015 1010   PHURINE 6.0 12/03/2015 1010   GLUCOSEU NEGATIVE 12/03/2015 1010   HGBUR NEGATIVE 12/03/2015 1010   BILIRUBINUR NEGATIVE 12/03/2015 1010   KETONESUR NEGATIVE 12/03/2015 1010   PROTEINUR NEGATIVE 12/03/2015 1010   UROBILINOGEN 0.2 04/13/2014 1200   NITRITE NEGATIVE 12/03/2015 1010   LEUKOCYTESUR NEGATIVE 12/03/2015 1010   Sepsis Labs: @LABRCNTIP (procalcitonin:4,lacticidven:4) ) Recent Results (from the past 240 hour(s))  Surgical pcr screen     Status: None   Collection Time: 02/11/17  1:04  AM  Result Value Ref Range Status   MRSA, PCR NEGATIVE NEGATIVE Final   Staphylococcus aureus NEGATIVE NEGATIVE Final    Comment: (NOTE) The Xpert SA Assay (FDA approved for NASAL specimens in patients 45 years of age and older), is one component of a comprehensive surveillance program. It is not intended to diagnose infection nor to guide or monitor treatment.          Radiology Studies: No results found.    Scheduled Meds: . amLODipine  2.5 mg Oral Daily  . bisacodyl  10 mg Rectal Daily  . busPIRone  15 mg Oral TID  . cholecalciferol  1,000 Units Oral Daily  . clonazePAM  0.5 mg Oral BID  . donepezil  10 mg Oral QHS  . enoxaparin (LOVENOX) injection  30 mg Subcutaneous Q24H  . feeding supplement (ENSURE ENLIVE)  237 mL Oral Q1500  . [START ON 02/17/2017] furosemide  120 mg Oral BID  . isosorbide mononitrate  15 mg Oral Daily  . metoprolol tartrate  25 mg Oral BID  . mometasone-formoterol  2 puff Inhalation BID  . pantoprazole  40 mg Oral Daily  . pravastatin  40 mg Oral q1800  . sertraline  100 mg Oral Daily   Continuous Infusions: . methocarbamol (ROBAXIN)  IV    . tranexamic acid (CYKLOKAPRON) topical -INTRAOP       LOS: 6 days    Time spent in minutes: 35  Cordarrius Coad, Pamala Hurry, MD [facility] Pager: [email or web address] Password [facility] 02/16/2017, 5:46 PM

## 2017-02-16 NOTE — Progress Notes (Signed)
Physical Therapy Treatment Patient Details Name: [name] MRN: [id number] DOB: January 11, 1936 Today's Date: 02/16/2017    History of Present Illness Pt is a 82 y/o female with a PMH significant for emphysema with chronic hypoxic respiratory failure, CAD, CHF, dementia, HTN. She presents s/p fall after tripping over her oxygen tubing and landing on her R side. She sustained a R femoral neck fracture and is now s/p R hip hemiarthroplasty (anterior approach) on 02/12/17.    PT Comments    Patient is making gradual progress toward mobility goals and tolerated session well. Current plan remains appropriate.    Follow Up Recommendations  SNF;Supervision/Assistance - 24 hour     Equipment Recommendations  None recommended by PT    Recommendations for Other Services       Precautions / Restrictions Precautions Precautions: Fall Precaution Comments: on 3-4 L supplemental O2 at baseline per pt report Restrictions Weight Bearing Restrictions: Yes RLE Weight Bearing: Weight bearing as tolerated    Mobility  Bed Mobility Overal bed mobility: Needs Assistance Bed Mobility: Supine to Sit     Supine to sit: Min assist     General bed mobility comments: cues for sequencing and technique; use of rail; assist to bring R LE to EOB  Transfers Overall transfer level: Needs assistance Equipment used: Rolling walker (2 wheeled) Transfers: Sit to/from Stand Sit to Stand: Min assist         General transfer comment: assist to power up into standing; cues for safe hand placement  Ambulation/Gait Ambulation/Gait assistance: Min assist Ambulation Distance (Feet): 35 Feet Assistive device: Rolling walker (2 wheeled) Gait Pattern/deviations: Trunk flexed;Narrow base of support;Step-through pattern;Decreased step length - right;Decreased step length - left Gait velocity: Decreased   General Gait Details: cues for increased bilat step length, sequencing, and posture   Stairs             Wheelchair Mobility    Modified Rankin (Stroke Patients Only)       Balance Overall balance assessment: Needs assistance Sitting-balance support: Feet supported;No upper extremity supported Sitting balance-Leahy Scale: Fair     Standing balance support: Bilateral upper extremity supported;During functional activity Standing balance-Leahy Scale: Poor                              Cognition Arousal/Alertness: Awake/alert Behavior During Therapy: WFL for tasks assessed/performed Overall Cognitive Status: Within Functional Limits for tasks assessed                                        Exercises Total Joint Exercises Ankle Circles/Pumps: AROM;Both;10 reps Quad Sets: AROM;Both;10 reps Heel Slides: AROM;Right;10 reps Hip ABduction/ADduction: AROM;Right;10 reps    General Comments        Pertinent Vitals/Pain Pain Assessment: Faces Faces Pain Scale: Hurts a little bit Pain Location: R hip Pain Descriptors / Indicators: Discomfort;Heaviness Pain Intervention(s): Monitored during session;Repositioned;Limited activity within patient's tolerance    Home Living                      Prior Function            PT Goals (current goals can now be found in the care plan section) Acute Rehab PT Goals PT Goal Formulation: With patient Time For Goal Achievement: 02/27/17 Potential to Achieve Goals: Good Progress towards PT goals: Progressing  toward goals    Frequency    Min 3X/week      PT Plan Current plan remains appropriate    Co-evaluation              AM-PAC PT "6 Clicks" Daily Activity  Outcome Measure  Difficulty turning over in bed (including adjusting bedclothes, sheets and blankets)?: Unable Difficulty moving from lying on back to sitting on the side of the bed? : Unable Difficulty sitting down on and standing up from a chair with arms (e.g., wheelchair, bedside commode, etc,.)?: Unable Help needed  moving to and from a bed to chair (including a wheelchair)?: A Little Help needed walking in hospital room?: A Little Help needed climbing 3-5 steps with a railing? : A Lot 6 Click Score: 11    End of Session Equipment Utilized During Treatment: Gait belt;Oxygen Activity Tolerance: Patient tolerated treatment well Patient left: with call bell/phone within reach;in chair Nurse Communication: Mobility status PT Visit Diagnosis: Unsteadiness on feet (R26.81);History of falling (Z91.81);Pain Pain - Right/Left: Right Pain - part of body: Hip     Time: 4098-1191 PT Time Calculation (min) (ACUTE ONLY): 30 min  Charges:  $Gait Training: 8-22 mins $Therapeutic Exercise: 8-22 mins                    G Codes:       Erline Levine, PTA Pager: [phone]     Carolynne Edouard 02/16/2017, 5:08 PM

## 2017-02-17 ENCOUNTER — Inpatient Hospital Stay
Admission: RE | Admit: 2017-02-17 | Discharge: 2017-03-09 | Disposition: A | Discharge status: Home / Self Care | Payer: Medicare Other | Source: Ambulatory Visit | Attending: Internal Medicine | Admitting: Internal Medicine

## 2017-02-17 DIAGNOSIS — I5032 Chronic diastolic (congestive) heart failure: Secondary | ICD-10-CM | POA: Diagnosis not present

## 2017-02-17 DIAGNOSIS — F411 Generalized anxiety disorder: Secondary | ICD-10-CM | POA: Diagnosis not present

## 2017-02-17 DIAGNOSIS — J449 Chronic obstructive pulmonary disease, unspecified: Secondary | ICD-10-CM | POA: Diagnosis not present

## 2017-02-17 DIAGNOSIS — F0390 Unspecified dementia without behavioral disturbance: Secondary | ICD-10-CM | POA: Diagnosis not present

## 2017-02-17 DIAGNOSIS — R4182 Altered mental status, unspecified: Secondary | ICD-10-CM | POA: Diagnosis not present

## 2017-02-17 DIAGNOSIS — S72009A Fracture of unspecified part of neck of unspecified femur, initial encounter for closed fracture: Secondary | ICD-10-CM | POA: Diagnosis not present

## 2017-02-17 DIAGNOSIS — Z471 Aftercare following joint replacement surgery: Secondary | ICD-10-CM | POA: Diagnosis not present

## 2017-02-17 DIAGNOSIS — R262 Difficulty in walking, not elsewhere classified: Secondary | ICD-10-CM | POA: Diagnosis not present

## 2017-02-17 DIAGNOSIS — S72001D Fracture of unspecified part of neck of right femur, subsequent encounter for closed fracture with routine healing: Secondary | ICD-10-CM | POA: Diagnosis not present

## 2017-02-17 DIAGNOSIS — M6281 Muscle weakness (generalized): Secondary | ICD-10-CM | POA: Diagnosis not present

## 2017-02-17 DIAGNOSIS — I2510 Atherosclerotic heart disease of native coronary artery without angina pectoris: Secondary | ICD-10-CM | POA: Diagnosis not present

## 2017-02-17 DIAGNOSIS — Z9181 History of falling: Secondary | ICD-10-CM | POA: Diagnosis not present

## 2017-02-17 DIAGNOSIS — S72001A Fracture of unspecified part of neck of right femur, initial encounter for closed fracture: Secondary | ICD-10-CM | POA: Diagnosis not present

## 2017-02-17 DIAGNOSIS — R279 Unspecified lack of coordination: Secondary | ICD-10-CM | POA: Diagnosis not present

## 2017-02-17 DIAGNOSIS — S72001S Fracture of unspecified part of neck of right femur, sequela: Secondary | ICD-10-CM | POA: Diagnosis not present

## 2017-02-17 DIAGNOSIS — J439 Emphysema, unspecified: Secondary | ICD-10-CM | POA: Diagnosis not present

## 2017-02-17 DIAGNOSIS — J9611 Chronic respiratory failure with hypoxia: Secondary | ICD-10-CM | POA: Diagnosis not present

## 2017-02-17 DIAGNOSIS — I10 Essential (primary) hypertension: Secondary | ICD-10-CM | POA: Diagnosis not present

## 2017-02-17 DIAGNOSIS — G20 Parkinson's disease: Secondary | ICD-10-CM | POA: Diagnosis not present

## 2017-02-17 DIAGNOSIS — N183 Chronic kidney disease, stage 3 (moderate): Secondary | ICD-10-CM | POA: Diagnosis not present

## 2017-02-17 DIAGNOSIS — M25551 Pain in right hip: Secondary | ICD-10-CM | POA: Diagnosis not present

## 2017-02-17 MED ORDER — CLONAZEPAM 0.5 MG PO TABS: 0.2500 mg | ORAL_TABLET | Freq: Every evening | ORAL | 0 refills | Status: DC | PRN

## 2017-02-17 MED ORDER — POTASSIUM CHLORIDE CRYS ER 20 MEQ PO TBCR
40.0000 meq | EXTENDED_RELEASE_TABLET | Freq: Once | ORAL | Status: AC
  Administered 2017-02-17: 40 meq via ORAL
  Filled 2017-02-17: qty 2

## 2017-02-17 NOTE — Clinical Social Work Note (Signed)
Clinical Social Worker facilitated patient discharge including contacting patient family and facility to confirm patient discharge plans.  Clinical information faxed to facility and family agreeable with plan.  CSW arranged ambulance transport via [facility] to [facility] .  RN to call [phone] for report prior to discharge.  Clinical Social Worker will sign off for now as social work intervention is no longer needed. Please consult [name] again if new need arises.  [place], [place] [phone]

## 2017-02-17 NOTE — Progress Notes (Signed)
Physical Therapy Treatment Patient Details Name: [name] MRN: [id number] DOB: 1935/09/30 Today's Date: 02/17/2017    History of Present Illness Pt is a 81 y/o female with a PMH significant for emphysema with chronic hypoxic respiratory failure, CAD, CHF, dementia, HTN. She presents s/p fall after tripping over her oxygen tubing and landing on her R side. She sustained a R femoral neck fracture and is now s/p R hip hemiarthroplasty (anterior approach) on 02/12/17.    PT Comments    Patient continues to make gradual progress toward mobility goals. Pt demonstrated decreased activity tolerance however did demonstrate improved gait mechanics and less difficulty advancing R LE when ambulating. Pt led through R LE therex and tolerated well. Continue to progress as tolerated with anticipated d/c to SNF for further skilled PT services.     Follow Up Recommendations  SNF;Supervision/Assistance - 24 hour     Equipment Recommendations  None recommended by PT    Recommendations for Other Services       Precautions / Restrictions Precautions Precautions: Fall Precaution Comments: on 3-4 L supplemental O2 at baseline per pt report Restrictions Weight Bearing Restrictions: Yes RLE Weight Bearing: Weight bearing as tolerated    Mobility  Bed Mobility Overal bed mobility: Needs Assistance Bed Mobility: Supine to Sit     Supine to sit: Min assist     General bed mobility comments: cues for sequencing and use of rail; assist to bring R LE to EOB and bed pad used to scoot hips  Transfers Overall transfer level: Needs assistance Equipment used: Rolling walker (2 wheeled) Transfers: Sit to/from Stand Sit to Stand: Min assist         General transfer comment: assist to power up; cues for safe hand placement  Ambulation/Gait Ambulation/Gait assistance: Min assist Ambulation Distance (Feet): 20 Feet Assistive device: Rolling walker (2 wheeled) Gait Pattern/deviations: Trunk  flexed;Step-through pattern;Decreased step length - right;Decreased step length - left Gait velocity: Decreased   General Gait Details: cues for bilat step length and R heel strike; pt with improved posture, stride length, and ability to advance R LE   Stairs            Wheelchair Mobility    Modified Rankin (Stroke Patients Only)       Balance Overall balance assessment: Needs assistance Sitting-balance support: Feet supported;No upper extremity supported Sitting balance-Leahy Scale: Fair     Standing balance support: Bilateral upper extremity supported;During functional activity Standing balance-Leahy Scale: Poor                              Cognition Arousal/Alertness: Awake/alert Behavior During Therapy: WFL for tasks assessed/performed Overall Cognitive Status: Within Functional Limits for tasks assessed                                        Exercises Total Joint Exercises Ankle Circles/Pumps: AROM;Both;10 reps Quad Sets: AROM;Both;10 reps Heel Slides: AROM;Right;10 reps Hip ABduction/ADduction: AROM;Right;10 reps    General Comments        Pertinent Vitals/Pain Pain Assessment: Faces Faces Pain Scale: Hurts little more Pain Location: R hip Pain Descriptors / Indicators: Discomfort;Heaviness;Sore Pain Intervention(s): Limited activity within patient's tolerance;Monitored during session;Premedicated before session;Repositioned    Home Living  Prior Function            PT Goals (current goals can now be found in the care plan section) Acute Rehab PT Goals PT Goal Formulation: With patient Time For Goal Achievement: 02/27/17 Potential to Achieve Goals: Good Progress towards PT goals: Progressing toward goals    Frequency    Min 3X/week      PT Plan Current plan remains appropriate    Co-evaluation              AM-PAC PT "6 Clicks" Daily Activity  Outcome Measure   Difficulty turning over in bed (including adjusting bedclothes, sheets and blankets)?: Unable Difficulty moving from lying on back to sitting on the side of the bed? : Unable Difficulty sitting down on and standing up from a chair with arms (e.g., wheelchair, bedside commode, etc,.)?: Unable Help needed moving to and from a bed to chair (including a wheelchair)?: A Little Help needed walking in hospital room?: A Little Help needed climbing 3-5 steps with a railing? : A Lot 6 Click Score: 11    End of Session Equipment Utilized During Treatment: Gait belt;Oxygen Activity Tolerance: Patient tolerated treatment well Patient left: with call bell/phone within reach;in chair;with family/visitor present Nurse Communication: Mobility status PT Visit Diagnosis: Unsteadiness on feet (R26.81);History of falling (Z91.81);Pain Pain - Right/Left: Right Pain - part of body: Hip     Time: 1218-1242 PT Time Calculation (min) (ACUTE ONLY): 24 min  Charges:  $Gait Training: 8-22 mins $Therapeutic Exercise: 8-22 mins                    G Codes:       Erline Levine, PTA Pager: [phone]     Carolynne Edouard 02/17/2017, 2:36 PM

## 2017-02-17 NOTE — Progress Notes (Signed)
Report called to [facility]. [facility] here to transport patient to facility. Patient spouse at the bedside.

## 2017-02-17 NOTE — Clinical Social Work Placement (Signed)
   CLINICAL SOCIAL WORK PLACEMENT  NOTE  Date:  02/17/2017  Patient Details  Name: SHANDIIN WOOLUMS MRN: [id number] Date of Birth: Jan 03, 1936  Clinical Social Work is seeking post-discharge placement for this patient at the Skilled  Nursing Facility level of care (*CSW will initial, date and re-position this form in  chart as items are completed):  Yes   Patient/family provided with [place] Clinical Social Work Department's list of facilities offering this level of care within the geographic area requested by the patient (or if unable, by the patient's family).  Yes   Patient/family informed of their freedom to choose among providers that offer the needed level of care, that participate in Medicare, Medicaid or managed care program needed by the patient, have an available bed and are willing to accept the patient.  Yes   Patient/family informed of [facility]'s ownership interest in [facility] and [facility], as well as of the fact that they are under no obligation to receive care at these facilities.  PASRR submitted to EDS on       PASRR number received on 03/16/17     Existing PASRR number confirmed on 02/13/17     FL2 transmitted to all facilities in geographic area requested by pt/family on 03/16/17     FL2 transmitted to all facilities within larger geographic area on 02/13/17     Patient informed that his/her managed care company has contracts with or will negotiate with certain facilities, including the following:        Yes   Patient/family informed of bed offers received.  Patient chooses bed at [facility]     Physician recommends and patient chooses bed at      Patient to be transferred to [facility] on 02/17/17.  Patient to be transferred to facility by [facility]     Patient family notified on 02/17/17 of transfer.  Name of family member notified:  granddaughter      PHYSICIAN Please prepare priority discharge summary, including  medications, Please prepare prescriptions     Additional Comment:    _______________________________________________ Maree Krabbe, LCSW 02/17/2017, 2:23 PM

## 2017-02-17 NOTE — Discharge Summary (Signed)
Physician Discharge Summary  [name] QVZ:[id number] DOB: December 01, 1935 DOA: 02/10/2017  PCP: Babs Sciara, MD  Admit date: 02/10/2017 Discharge date: 02/17/2017  Time spent: > 35 minutes  Recommendations for Outpatient Follow-up:  1. Ensure follow up with orthopaedic surgeon   Discharge Diagnoses:  Principal Problem:   Closed right hip fracture, initial encounter [facility]) Active Problems:   Dementia   CAD (coronary artery disease)   Chronic respiratory failure with hypoxia (HCC)   CKD (chronic kidney disease) stage 3, GFR 30-59 ml/min (HCC)   Chronic diastolic congestive heart failure (HCC)   Benign essential tremor   COPD (chronic obstructive pulmonary disease) (HCC)   Anxiety   Hypokalemia   Discharge Condition: stable  Diet recommendation: Heart healthy  Filed Weights   02/14/17 1300 02/16/17 1300 02/17/17 0539  Weight: 75.6 kg (166 lb 10.7 oz) 81.9 kg (180 lb 8.9 oz) 76.7 kg (169 lb 1.5 oz)    History of present illness:  82 y.o.femalewith medical history significant foremphysema with chronic hypoxic respiratory failure, coronary artery disease, chronic diastolic CHF, anxiety disorder, dementia, and hypertension, who is on hospice at home and presented to the emergency department with severe right hip pain after patient reports that she had been in her usual state until tripping over her oxygen tubing, landing on her right side, and experiencing immediate and severe pain at the right hip.   Pt subsequently had closed right hip fx  Hospital Course:  Closed right hip fracture, initial encounter  - s/p surgery - Ortho provided script for anticoagulation and pain medication - SNF for rehab   Chronic diastolic congestive heart failure / AKI on CKD 3 - creatine noted to be rising on 12/31  - suspect dehydration in setting of surgery and acute blood loss - I dicussed holding the Lasix on 12/31 when I first noted a rise in Cr however, her family became angry and  argumentative and did not want to stop it - continue prior to admission medication regimen as preferred by patient and poa  Acute blood loss anemia - s/p transfusion and improvement in symptoms related to anemia.    COPD (chronic obstructive pulmonary disease) - currently no wheezing or distress- stable    Anxiety/ dementia - cont home meds    Hypokalemia -   replaced prior to d/c with kdur 40 meq    Chronic respiratory failure with hypoxia- on 4 L at home chronically - stable, continue supplemental oxygen   Procedures:  None  Consultations:  Ortho  Discharge Exam: Vitals:   02/16/17 2022 02/17/17 0539  BP: (!) 145/60 (!) 157/60  Pulse: 60 68  Resp: 16 16  Temp: 98.7 F (37.1 C) 98.5 F (36.9 C)  SpO2: 97% 96%    General: Pt in nad, alert and awake Cardiovascular: no cyanosis Respiratory: no increased wob, no wheezes  Discharge Instructions   Discharge Instructions    Call MD for:  severe uncontrolled pain   Complete by:  As directed    Call MD for:  temperature >100.4   Complete by:  As directed    Diet - low sodium heart healthy   Complete by:  As directed    Discharge instructions   Complete by:  As directed    Please ensure you follow up with your orthopaedic surgeon for further evaluation and recommendations   Increase activity slowly   Complete by:  As directed    Weight bearing as tolerated   Complete by:  As directed  Allergies as of 02/17/2017      Reactions   Amoxil [amoxicillin]    Thrush each times she takes it   Codeine Nausea And Vomiting   Dilaudid [hydromorphone Hcl] Other (See Comments)   Extremely sensitive   Erythromycin Other (See Comments)   Intense stomach pain   Sulfonamide Derivatives    unknown      Medication List    TAKE these medications   acetaminophen 500 MG tablet Commonly known as:  TYLENOL Take 500 mg by mouth every 4 (four) hours.   albuterol (2.5 MG/3ML) 0.083% nebulizer solution Commonly  known as:  PROVENTIL Take 3 mLs (2.5 mg total) by nebulization every 4 (four) hours as needed for wheezing or shortness of breath.   albuterol 108 (90 Base) MCG/ACT inhaler Commonly known as:  PROVENTIL HFA;VENTOLIN HFA Inhale 2 puffs into the lungs every 4 (four) hours as needed for wheezing or shortness of breath.   amLODipine 5 MG tablet Commonly known as:  NORVASC TAKE 1/2 TABLET BY MOUTH DAILY   aspirin EC 81 MG tablet Take 81 mg by mouth daily.   budesonide-formoterol 160-4.5 MCG/ACT inhaler Commonly known as:  SYMBICORT Inhale 2 puffs into the lungs 2 (two) times daily.   busPIRone 15 MG tablet Commonly known as:  BUSPAR Take 1 tablet (15 mg total) by mouth 3 (three) times daily.   CALCIUM 600 + D 600-200 MG-UNIT Tabs Generic drug:  Calcium Carb-Cholecalciferol Take 1 tablet by mouth daily.   cefPROZIL 250 MG tablet Commonly known as:  CEFZIL Take 250 mg by mouth 2 (two) times daily. 5 DAY COURSE STARTING ON 02/03/2017 COMPLETED   cetirizine 10 MG tablet Commonly known as:  ZYRTEC Take 10 mg by mouth daily.   clonazePAM 0.5 MG tablet Commonly known as:  KLONOPIN Take 0.5 tablets (0.25 mg total) by mouth at bedtime as needed for anxiety. What changed:    how much to take  how to take this  when to take this  reasons to take this  additional instructions   enoxaparin 40 MG/0.4ML injection Commonly known as:  LOVENOX Inject 0.4 mLs (40 mg total) into the skin daily.   erythromycin ophthalmic ointment Place 1 application into the right eye 4 (four) times daily.   furosemide 40 MG tablet Commonly known as:  LASIX Take 3 tabs BID What changed:    how much to take  how to take this  when to take this  additional instructions   GAS RELIEF PO Take 1 tablet by mouth daily as needed (gas relief).   HYDROcodone-acetaminophen 5-325 MG tablet Commonly known as:  NORCO Take 1-2 tablets by mouth every 6 (six) hours as needed.   ipratropium 0.02 %  nebulizer solution Commonly known as:  ATROVENT USE 1 VIAL VIA NEBULIZER EVERY 6 HOURS AS NEEDED FOR WHEEZING OR SHORTNESS OF BREATH   losartan 25 MG tablet Commonly known as:  COZAAR TAKE 1 TABLET BY MOUTH DAILY   meclizine 12.5 MG tablet Commonly known as:  ANTIVERT Take 12.5 mg by mouth every 6 (six) hours as needed for dizziness.   metolazone 2.5 MG tablet Commonly known as:  ZAROXOLYN TAKE 1 TABLET BY MOUTH DAILY ON TUESDAYS AND THURSDAYS What changed:  See the new instructions.   metoprolol tartrate 25 MG tablet Commonly known as:  LOPRESSOR TAKE 1 TABLET BY MOUTH TWICE DAILY What changed:    how much to take  how to take this  when to take this   morphine  20 MG/ML concentrated solution Commonly known as:  ROXANOL Give 0.125 mL (2.5 mg) up to 0.25 mL (5 mg) every four hours as needed. What changed:    how to take this  when to take this  reasons to take this  additional instructions   nitroGLYCERIN 0.4 MG SL tablet Commonly known as:  NITROSTAT Place 1 tablet (0.4 mg total) under the tongue as needed for chest pain.   omeprazole 20 MG capsule Commonly known as:  PRILOSEC Take 1 capsule (20 mg total) by mouth daily.   ondansetron 8 MG disintegrating tablet Commonly known as:  ZOFRAN ODT Take 1 tablet (8 mg total) by mouth every 8 (eight) hours as needed for nausea or vomiting.   polyethylene glycol packet Commonly known as:  MIRALAX / GLYCOLAX Take 17 g by mouth daily as needed for moderate constipation.   potassium chloride SA 20 MEQ tablet Commonly known as:  K-DUR,KLOR-CON TAKE 1 TABLET(20 MEQ) BY MOUTH TWICE DAILY   sertraline 100 MG tablet Commonly known as:  ZOLOFT TAKE 1& 1/2 TABLETS BY MOUTH DAILY            Discharge Care Instructions  (From admission, onward)        Start     Ordered   02/12/17 0000  Weight bearing as tolerated     02/12/17 0911     Allergies  Allergen Reactions  . Amoxil [Amoxicillin]     Thrush each  times she takes it  . Codeine Nausea And Vomiting  . Dilaudid [Hydromorphone Hcl] Other (See Comments)    Extremely sensitive  . Erythromycin Other (See Comments)    Intense stomach pain  . Sulfonamide Derivatives     unknown    Contact information for follow-up providers    Tarry Kos, MD. Schedule an appointment as soon as possible for a visit in 2 weeks.   Specialty:  Orthopedic Surgery Why:  For suture removal, For wound re-check Contact information: [address] [place] [address] [phone]            Contact information for after-discharge care    Destination    [facility] .   Service:  Skilled Nursing Contact information: [address] [place] [address] [phone]                   The results of significant diagnostics from this hospitalization (including imaging, microbiology, ancillary and laboratory) are listed below for reference.    Significant Diagnostic Studies: Dg Chest 1 View  Result Date: 02/10/2017 CLINICAL DATA:  Fall. Preoperative respiratory evaluation for hip fracture. EXAM: CHEST 1 VIEW COMPARISON:  09/19/2016 FINDINGS: The lungs are clear without focal pneumonia, edema, pneumothorax or pleural effusion. Hyperexpansion suggests emphysema. Interstitial markings are diffusely coarsened with chronic features. The cardio pericardial silhouette is enlarged. Multiple bilateral old rib fractures evident. Telemetry leads overlie the chest. IMPRESSION: Cardiomegaly with emphysema.  No acute cardiopulmonary findings. Electronically Signed   By: Kennith Center M.D.   On: 02/10/2017 18:17   Pelvis Portable  Result Date: 02/12/2017 CLINICAL DATA:  Anterior right hip replacement. EXAM: PORTABLE PELVIS 1-2 VIEWS COMPARISON:  Intraoperative images same date. Preoperative exam of 02/10/2017. FINDINGS: Interval right hip arthroplasty without hardware complication or periprosthetic fracture. Left  femoral head is located. Sacroiliac joints are symmetric. Expected postoperative soft tissue gas and lateral surgical staples. IMPRESSION: Expected appearance after right hip arthroplasty. Electronically Signed   By: Hosie Spangle.D.  On: 02/12/2017 10:31   Dg Chest Port 1 View  Result Date: 02/14/2017 CLINICAL DATA:  Hypoxia EXAM: PORTABLE CHEST 1 VIEW COMPARISON:  02/10/2017 FINDINGS: Cardiac shadow is enlarged but stable. Old rib fractures are noted bilaterally. The lungs are well aerated bilaterally. No focal infiltrate is seen. No sizable effusion is noted. IMPRESSION: No active disease. Electronically Signed   By: Alcide Clever M.D.   On: 02/14/2017 09:53   Dg C-arm 1-60 Min  Result Date: 02/12/2017 CLINICAL DATA:  Femoral neck fracture EXAM: DG C-ARM 61-120 MIN; OPERATIVE RIGHT HIP WITH PELVIS COMPARISON:  02/10/2017 FINDINGS: Right hip hemiarthroplasty has been placed. There is anatomic alignment of the osseous and prosthetic structures. No breakage or loosening of the hardware. IMPRESSION: Right hip hemiarthroplasty anatomically aligned. Electronically Signed   By: Jolaine Click M.D.   On: 02/12/2017 10:28   Dg Hip Operative Unilat W Or W/o Pelvis Right  Result Date: 02/12/2017 CLINICAL DATA:  Femoral neck fracture EXAM: DG C-ARM 61-120 MIN; OPERATIVE RIGHT HIP WITH PELVIS COMPARISON:  02/10/2017 FINDINGS: Right hip hemiarthroplasty has been placed. There is anatomic alignment of the osseous and prosthetic structures. No breakage or loosening of the hardware. IMPRESSION: Right hip hemiarthroplasty anatomically aligned. Electronically Signed   By: Jolaine Click M.D.   On: 02/12/2017 10:28   Dg Hip Unilat With Pelvis 2-3 Views Right  Result Date: 02/10/2017 CLINICAL DATA:  Preop.  Fall with right hip pain. EXAM: DG HIP (WITH OR WITHOUT PELVIS) 2-3V RIGHT COMPARISON:  12/16/2015 FINDINGS: AP view the pelvis and AP/frog leg views of the right hip. Comminuted, impacted subcapital or mid right  femoral neck fracture with minimal varus angulation. No dislocation. Sacroiliac joints are symmetric. Support apparatus overlies the left hemipelvis, without gross left-sided abnormality identified. IMPRESSION: Right femoral neck fracture with impaction and mild varus angulation. Electronically Signed   By: Jeronimo Greaves M.D.   On: 02/10/2017 18:21    Microbiology: Recent Results (from the past 240 hour(s))  Surgical pcr screen     Status: None   Collection Time: 02/11/17  1:04 AM  Result Value Ref Range Status   MRSA, PCR NEGATIVE NEGATIVE Final   Staphylococcus aureus NEGATIVE NEGATIVE Final    Comment: (NOTE) The Xpert SA Assay (FDA approved for NASAL specimens in patients 72 years of age and older), is one component of a comprehensive surveillance program. It is not intended to diagnose infection nor to guide or monitor treatment.      Labs: Basic Metabolic Panel: Recent Labs  Lab 02/11/17 1031 02/12/17 0314 02/13/17 0609 02/14/17 0421 02/15/17 0443 02/16/17 0903  NA 137 139 136 137 138 139  K 2.8* 3.6 3.0* 3.3* 3.4* 3.2*  CL 94* 94* 88* 89* 96* 101  CO2 32 35* 31 32 32 27  GLUCOSE 156* 146* 149* 153* 133* 194*  BUN 38* 36* 41* 51* 51* 49*  CREATININE 1.67* 1.67* 1.96* 2.19* 2.18* 1.87*  CALCIUM 8.7* 9.0 7.8* 8.0* 8.3* 8.3*  MG 1.9 1.9 1.8  --   --   --   PHOS 3.7 3.3 3.6  --   --   --    Liver Function Tests: Recent Labs  Lab 02/11/17 1031 02/12/17 0314 02/13/17 0609  AST 20 18 20   ALT 15 13* 10*  ALKPHOS 37* 40 37*  BILITOT 0.7 0.7 0.6  PROT 6.5 7.1 6.4*  ALBUMIN 3.9 4.0 3.5   No results for input(s): LIPASE, AMYLASE in the last 168 hours. No results for input(s):  AMMONIA in the last 168 hours. CBC: Recent Labs  Lab 02/10/17 1615 02/11/17 1031 02/12/17 0314 02/13/17 0609 02/14/17 0421 02/15/17 0443 02/16/17 0903  WBC 4.8 6.1 7.5 7.8 6.2 5.7 6.4  NEUTROABS 3.8 4.8 6.2 6.9  --   --   --   HGB 10.8* 9.8* 10.6* 8.5* 7.8* 7.5* 10.3*  HCT 32.9* 30.9*  32.6* 25.8* 23.3* 23.1* 30.7*  MCV 88.2 87.8 88.6 87.8 86.6 88.5 85.8  PLT 155 135* 147* 150 141* 171 156   Cardiac Enzymes: No results for input(s): CKTOTAL, CKMB, CKMBINDEX, TROPONINI in the last 168 hours. BNP: BNP (last 3 results) Recent Labs    02/26/16 1320  BNP 976.0*    ProBNP (last 3 results) Recent Labs    02/19/16 1121  PROBNP 7,074*    CBG: No results for input(s): GLUCAP in the last 168 hours.     Signed:  Penny Pia MD.  [facility] 02/17/2017, 11:40 AM

## 2017-02-18 ENCOUNTER — Encounter: Payer: Self-pay | Admitting: Family Medicine

## 2017-02-18 ENCOUNTER — Encounter (HOSPITAL_COMMUNITY)
Admission: AD | Admit: 2017-02-18 | Discharge: 2017-02-18 | Disposition: A | Discharge status: Home / Self Care | Payer: Medicare Other | Source: Skilled Nursing Facility | Attending: Internal Medicine | Admitting: Internal Medicine

## 2017-02-18 DIAGNOSIS — Z471 Aftercare following joint replacement surgery: Secondary | ICD-10-CM | POA: Insufficient documentation

## 2017-02-18 LAB — BASIC METABOLIC PANEL
ANION GAP: 13 (ref 5–15)
BUN: 41 mg/dL — AB (ref 6–20)
CO2: 28 mmol/L (ref 22–32)
Calcium: 8.6 mg/dL — ABNORMAL LOW (ref 8.9–10.3)
Chloride: 100 mmol/L — ABNORMAL LOW (ref 101–111)
Creatinine, Ser: 1.5 mg/dL — ABNORMAL HIGH (ref 0.44–1.00)
GFR calc Af Amer: 37 mL/min — ABNORMAL LOW (ref >60)
GFR, EST NON AFRICAN AMERICAN: 32 mL/min — AB (ref >60)
Glucose, Bld: 132 mg/dL — ABNORMAL HIGH (ref 65–99)
Potassium: 3.7 mmol/L (ref 3.5–5.1)
SODIUM: 141 mmol/L (ref 135–145)

## 2017-02-18 LAB — CBC WITH DIFFERENTIAL/PLATELET
BASOS ABS: 0 10*3/uL (ref 0.0–0.1)
BASOS PCT: 0 %
EOS PCT: 4 %
Eosinophils Absolute: 0.2 10*3/uL (ref 0.0–0.7)
HCT: 32.8 % — ABNORMAL LOW (ref 36.0–46.0)
Hemoglobin: 10.7 g/dL — ABNORMAL LOW (ref 12.0–15.0)
LYMPHS PCT: 10 %
Lymphs Abs: 0.6 10*3/uL — ABNORMAL LOW (ref 0.7–4.0)
MCH: 28.3 pg (ref 26.0–34.0)
MCHC: 32.6 g/dL (ref 30.0–36.0)
MCV: 86.8 fL (ref 78.0–100.0)
Monocytes Absolute: 0.4 10*3/uL (ref 0.1–1.0)
Monocytes Relative: 7 %
NEUTROS ABS: 4.6 10*3/uL (ref 1.7–7.7)
Neutrophils Relative %: 79 %
Platelets: 196 10*3/uL (ref 150–400)
RBC: 3.78 MIL/uL — AB (ref 3.87–5.11)
RDW: 14.2 % (ref 11.5–15.5)
WBC: 5.8 10*3/uL (ref 4.0–10.5)

## 2017-02-20 ENCOUNTER — Encounter: Payer: Self-pay | Admitting: Internal Medicine

## 2017-02-20 ENCOUNTER — Non-Acute Institutional Stay (SKILLED_NURSING_FACILITY): Payer: Medicare Other | Admitting: Internal Medicine

## 2017-02-20 DIAGNOSIS — J439 Emphysema, unspecified: Secondary | ICD-10-CM | POA: Diagnosis not present

## 2017-02-20 DIAGNOSIS — N183 Chronic kidney disease, stage 3 unspecified: Secondary | ICD-10-CM

## 2017-02-20 DIAGNOSIS — S72001S Fracture of unspecified part of neck of right femur, sequela: Secondary | ICD-10-CM | POA: Diagnosis not present

## 2017-02-20 DIAGNOSIS — I5032 Chronic diastolic (congestive) heart failure: Secondary | ICD-10-CM

## 2017-02-20 DIAGNOSIS — I10 Essential (primary) hypertension: Secondary | ICD-10-CM

## 2017-02-20 NOTE — Progress Notes (Signed)
Provider: Einar Crow  Location:   [facility] Room Number: 125/P Place of Service:  SNF (31)  PCP: Babs Sciara, MD Patient Care Team: Babs Sciara, MD as PCP - General  Extended Emergency Contact Information Primary Emergency Contact: Philbert Riser Address: [address]          [place], [place] [address] [place] Home Phone: [phone] Relation: Spouse Secondary Emergency Contact: Snead,Stacy Address: [address]          [place], [place] [address] [place] Phone: [phone] Relation: Grandaughter  Code Status: DNR Goals of Care: Advanced Directive information Advanced Directives 02/20/2017  Does Patient Have a Medical Advance Directive? Yes  Type of Advance Directive Out of facility DNR (pink MOST or yellow form)  Does patient want to make changes to medical advance directive? No - Patient declined  Copy of Healthcare Power of Attorney in Chart? -  Would patient like information on creating a medical advance directive? No - Patient declined  Pre-existing out of facility DNR order (yellow form or pink MOST form) -      Chief Complaint  Patient presents with  . New Admit To SNF    New Admission Visit    HPI: Patient is a 82 y.o. female seen today for admission to SNF for therapy. Patient was admitted to Hospital from 12/28-01/04 For Closed Right Hip fracture and Underwent ORIF  on 12/30.  Patient has multiple medical problems and is under Home Hospice care. She has h/o End Stage COPD and Diastolic CHF on Chronic Oxygen of 4 lit., Chronic Pain , CKD, Hypertension She was at home when she fell on her Nasal Cannula and had acute right sided pain. She was found to have Right Closed Hip Fracture. She underwent Right Hip Hemiarthroplasty on 12/30.  Patient had uncomplicated Post op course and now is in SNF for therapy. She says her pain is controlled.  She is on morphine as needed and also on Norco  PRN. She continues to have shortness of breath but is stable oxygen.  She is doing well with therapy.  At home she lives with her husband and her daughter helps her.  She says she is independent in her ADLs.  She plans to go home.    Past Medical History:  Diagnosis Date  . Acute on chronic diastolic heart failure (HCC) 04/11/2014  . Anxiety    [name] 02/15/2017  . Arthritis    "joints ache all the time" (02/15/2017)  . Asthma   . Bradycardia   . CAD (coronary artery disease) 08/2009   s/p PCI of the LAD  . Carotid artery stenosis   . CHF (congestive heart failure) (HCC)    [name] 02/15/2017  . Chronic bronchitis (HCC)   . CKD (chronic kidney disease), stage III (HCC)    [name] 02/15/2017  . COPD (chronic obstructive pulmonary disease) (HCC)   . Daily headache    "since I had shingles on my face/head" (02/15/2017)  . Dementia    [name] 02/15/2017  . GERD (gastroesophageal reflux disease)   . Heart murmur   . History of blood transfusion 1970s; 02/15/2017   "S/P miscarriage; low blood" (02/15/2017)  . Hyperlipidemia   . Hypertension   . Impaired fasting glucose   . NSTEMI (non-ST elevated myocardial infarction) (HCC)   . On home O2    "4 1/2L w/100 foot cord; 24/7" (02/15/2017)  . Osteoporosis 2009  . Pneumonia    "  several times" (02/15/2017)  . Seasonal allergies   . Ulnar neuropathy    Past Surgical History:  Procedure Laterality Date  . ANTERIOR APPROACH HEMI HIP ARTHROPLASTY Right 02/12/2017   Procedure: ANTERIOR APPROACH HEMI HIP ARTHROPLASTY;  Surgeon: Tarry Kos, MD;  Location: [facility] OR;  Service: Orthopedics;  Laterality: Right;  . CARDIAC CATHETERIZATION  11/2010   negative  . CARPAL TUNNEL RELEASE Right 2008   [name] 06/29/2010  . CATARACT EXTRACTION W/ INTRAOCULAR LENS  IMPLANT, BILATERAL Bilateral 12/2014  . CORONARY ANGIOPLASTY WITH STENT PLACEMENT    . DILATION AND CURETTAGE OF UTERUS    . FRACTURE SURGERY    . INCONTINENCE SURGERY  1991   "attached it to my pelvic  bone"  . LEFT HEART CATHETERIZATION WITH CORONARY ANGIOGRAM N/A 11/05/2012   Procedure: LEFT HEART CATHETERIZATION WITH CORONARY ANGIOGRAM;  Surgeon: Micheline Chapman, MD;  Location: [facility] CATH LAB;  Service: Cardiovascular;  Laterality: N/A;  . VAGINAL HYSTERECTOMY  1984   partial    reports that she quit smoking about 9 years ago. She has a 35.00 pack-year smoking history. she has never used smokeless tobacco. She reports that she does not drink alcohol or use drugs. Social History   Socioeconomic History  . Marital status: Married    Spouse name: Not on file  . Number of children: Not on file  . Years of education: Not on file  . Highest education level: Not on file  Social Needs  . Financial resource strain: Not on file  . Food insecurity - worry: Not on file  . Food insecurity - inability: Not on file  . Transportation needs - medical: Not on file  . Transportation needs - non-medical: Not on file  Occupational History  . Occupation: Retired    Associate Professor: RETIRED    Comment: Scientist, product/process development  Tobacco Use  . Smoking status: Former Smoker    Packs/day: 1.00    Years: 35.00    Pack years: 35.00    Last attempt to quit: 02/15/2008    Years since quitting: 9.0  . Smokeless tobacco: Never Used  Substance and Sexual Activity  . Alcohol use: No  . Drug use: No  . Sexual activity: Not Currently  Other Topics Concern  . Not on file  Social History Narrative   Lives in [place] with husband of 52 years   Granddaughter works at [employer] in [place]    Functional Status Survey:    Family History  Problem Relation Age of Onset  . Addison's disease Mother   . Alcohol abuse Father     Health Maintenance  Topic Date Due  . FOOT EXAM  03/23/2017 (Originally 12/04/2014)  . HEMOGLOBIN A1C  03/23/2017 (Originally 10/22/2015)  . OPHTHALMOLOGY EXAM  03/23/2017 (Originally 12/03/1945)  . TETANUS/TDAP  03/23/2017 (Originally 06/13/2013)  . INFLUENZA VACCINE  Completed  . DEXA  SCAN  Completed  . PNA vac Low Risk Adult  Completed    Allergies  Allergen Reactions  . Amoxil [Amoxicillin]     Thrush each times she takes it  . Codeine Nausea And Vomiting  . Dilaudid [Hydromorphone Hcl] Other (See Comments)    Extremely sensitive  . Erythromycin Other (See Comments)    Intense stomach pain  . Sulfonamide Derivatives     unknown    Outpatient Encounter Medications as of 02/20/2017  Medication Sig  . acetaminophen (TYLENOL) 500 MG tablet Take 500 mg by mouth every 4 (four) hours.  Marland Kitchen albuterol (PROVENTIL HFA;VENTOLIN  HFA) 108 (90 Base) MCG/ACT inhaler Inhale 2 puffs into the lungs every 4 (four) hours as needed for wheezing or shortness of breath.  Marland Kitchen albuterol (PROVENTIL) (2.5 MG/3ML) 0.083% nebulizer solution Take 3 mLs (2.5 mg total) by nebulization every 4 (four) hours as needed for wheezing or shortness of breath.  Marland Kitchen amLODipine (NORVASC) 5 MG tablet TAKE 1/2 TABLET BY MOUTH DAILY  . aspirin EC 81 MG tablet Take 81 mg by mouth daily.  . budesonide-formoterol (SYMBICORT) 160-4.5 MCG/ACT inhaler Inhale 2 puffs into the lungs 2 (two) times daily.  . busPIRone (BUSPAR) 15 MG tablet Take 1 tablet (15 mg total) by mouth 3 (three) times daily.  . Calcium Carb-Cholecalciferol (CALCIUM 600 + D) 600-200 MG-UNIT TABS Take 1 tablet by mouth daily.  . cetirizine (ZYRTEC) 10 MG tablet Take 10 mg by mouth daily.   . clonazePAM (KLONOPIN) 0.5 MG tablet Take 0.5 tablets (0.25 mg total) by mouth at bedtime as needed for anxiety.  . enoxaparin (LOVENOX) 40 MG/0.4ML injection Inject 0.4 mLs (40 mg total) into the skin daily.  Marland Kitchen erythromycin ophthalmic ointment Place 1 application into the right eye 4 (four) times daily.  . furosemide (LASIX) 40 MG tablet Take 120 mg by mouth 2 (two) times daily.  Marland Kitchen HYDROcodone-acetaminophen (NORCO) 5-325 MG tablet Take 1-2 tablets by mouth every 6 (six) hours as needed.  Marland Kitchen ipratropium (ATROVENT) 0.02 % nebulizer solution USE 1 VIAL VIA NEBULIZER  EVERY 6 HOURS AS NEEDED FOR WHEEZING OR SHORTNESS OF BREATH  . losartan (COZAAR) 25 MG tablet TAKE 1 TABLET BY MOUTH DAILY  . meclizine (ANTIVERT) 12.5 MG tablet Take 12.5 mg by mouth every 6 (six) hours as needed for dizziness.  . metolazone (ZAROXOLYN) 2.5 MG tablet TAKE 1 TABLET BY MOUTH DAILY ON TUESDAYS AND THURSDAYS  . metoprolol tartrate (LOPRESSOR) 25 MG tablet TAKE 1 TABLET BY MOUTH TWICE DAILY  . morphine 20 MG/5ML solution Take 2.5 mg by mouth every 4 (four) hours as needed for pain.  Marland Kitchen morphine 20 MG/5ML solution Take 5 mg by mouth every 4 (four) hours as needed for pain.  . nitroGLYCERIN (NITROSTAT) 0.4 MG SL tablet Place 1 tablet (0.4 mg total) under the tongue as needed for chest pain.  Marland Kitchen omeprazole (PRILOSEC) 20 MG capsule Take 1 capsule (20 mg total) by mouth daily.  . ondansetron (ZOFRAN ODT) 8 MG disintegrating tablet Take 1 tablet (8 mg total) by mouth every 8 (eight) hours as needed for nausea or vomiting.  . polyethylene glycol (MIRALAX / GLYCOLAX) packet Take 17 g by mouth daily as needed for moderate constipation.  . potassium chloride SA (K-DUR,KLOR-CON) 20 MEQ tablet TAKE 1 TABLET(20 MEQ) BY MOUTH TWICE DAILY  . sertraline (ZOLOFT) 100 MG tablet TAKE 1& 1/2 TABLETS BY MOUTH DAILY  . Simethicone (GAS RELIEF PO) Take 1 tablet by mouth daily as needed (gas relief).  . [DISCONTINUED] cefPROZIL (CEFZIL) 250 MG tablet Take 250 mg by mouth 2 (two) times daily. 5 DAY COURSE STARTING ON 02/03/2017 COMPLETED  . [DISCONTINUED] furosemide (LASIX) 40 MG tablet Take 3 tabs BID (Patient taking differently: Take 120 mg by mouth 2 (two) times daily. )  . [DISCONTINUED] morphine (ROXANOL) 20 MG/ML concentrated solution Give 0.125 mL (2.5 mg) up to 0.25 mL (5 mg) every four hours as needed.   No facility-administered encounter medications on file as of 02/20/2017.      Review of Systems  Constitutional: Negative.   HENT: Negative.   Respiratory: Positive for cough and  shortness of  breath.   Cardiovascular: Negative.   Gastrointestinal: Positive for constipation.  Genitourinary: Negative.   Musculoskeletal: Positive for back pain and myalgias.  Skin: Negative.   Neurological: Negative.   Psychiatric/Behavioral: Positive for sleep disturbance.    Vitals:   02/20/17 1024  BP: (!) 116/45  Pulse: 64  Resp: 20  Temp: 98.1 F (36.7 C)  TempSrc: Oral  SpO2: 99%   There is no height or weight on file to calculate BMI. Physical Exam  Constitutional: She is oriented to person, place, and time. She appears well-developed and well-nourished.  HENT:  Head: Normocephalic.  Mouth/Throat: Oropharynx is clear and moist.  Eyes: Pupils are equal, round, and reactive to light.  Neck: Neck supple.  Cardiovascular: Normal rate and regular rhythm.  Murmur heard. Pulmonary/Chest: She has no wheezes. She has no rales.  Decreased BS Bilateral  Abdominal: Soft. Bowel sounds are normal. She exhibits no distension. There is no tenderness. There is no rebound.  Musculoskeletal: She exhibits no edema.  Lymphadenopathy:    She has no cervical adenopathy.  Neurological: She is alert and oriented to person, place, and time.  No Focal deficits  Skin: Skin is warm and dry.  Psychiatric: She has a normal mood and affect. Her behavior is normal. Thought content normal.    Labs reviewed: Basic Metabolic Panel: Recent Labs    02/11/17 1031 02/12/17 0314 02/13/17 0609  02/15/17 0443 02/16/17 0903 02/18/17 0415  NA 137 139 136   < > 138 139 141  K 2.8* 3.6 3.0*   < > 3.4* 3.2* 3.7  CL 94* 94* 88*   < > 96* 101 100*  CO2 32 35* 31   < > 32 27 28  GLUCOSE 156* 146* 149*   < > 133* 194* 132*  BUN 38* 36* 41*   < > 51* 49* 41*  CREATININE 1.67* 1.67* 1.96*   < > 2.18* 1.87* 1.50*  CALCIUM 8.7* 9.0 7.8*   < > 8.3* 8.3* 8.6*  MG 1.9 1.9 1.8  --   --   --   --   PHOS 3.7 3.3 3.6  --   --   --   --    < > = values in this interval not displayed.   Liver Function Tests: Recent  Labs    02/11/17 1031 02/12/17 0314 02/13/17 0609  AST 20 18 20   ALT 15 13* 10*  ALKPHOS 37* 40 37*  BILITOT 0.7 0.7 0.6  PROT 6.5 7.1 6.4*  ALBUMIN 3.9 4.0 3.5   No results for input(s): LIPASE, AMYLASE in the last 8760 hours. No results for input(s): AMMONIA in the last 8760 hours. CBC: Recent Labs    02/12/17 0314 02/13/17 0609  02/15/17 0443 02/16/17 0903 02/18/17 0415  WBC 7.5 7.8   < > 5.7 6.4 5.8  NEUTROABS 6.2 6.9  --   --   --  4.6  HGB 10.6* 8.5*   < > 7.5* 10.3* 10.7*  HCT 32.6* 25.8*   < > 23.1* 30.7* 32.8*  MCV 88.6 87.8   < > 88.5 85.8 86.8  PLT 147* 150   < > 171 156 196   < > = values in this interval not displayed.   Cardiac Enzymes: Recent Labs    02/26/16 1320  TROPONINI 0.05*   BNP: Invalid input(s): POCBNP Lab Results  Component Value Date   HGBA1C 5.8 (H) 04/21/2015   Lab Results  Component Value Date   TSH  3.086 12/06/2013   No results found for: VITAMINB12 No results found for: FOLATE Lab Results  Component Value Date   IRON 57 12/29/2015   TIBC 397 12/29/2015   FERRITIN 68 12/29/2015    Imaging and Procedures obtained prior to SNF admission: No results found.  Assessment/Plan  Closed fracture of right hip status post hemiarthroplasty  Patient doing very well.  her pain is controlled. She is on Lovenox per Ortho. Follow-up with Ortho She is working with therapy and is able to bear some weight on that leg.  Chronic diastolic congestive heart failure  On high dose of Lasix and metolazone Patient is stable.  She is hospice care and do not want anything aggressive  Essential hypertension On low-dose of amlodipine blood pressure stable    COPD on chronic oxygen  Patient is stable. She continues on as needed bronchodilators and Symbicort Patient patient gets short of breath on minimal exertion  CKD (chronic kidney disease) stage 3, GFR 30-59 ml/min  Creatinine has stabilized we will continue to monitor  Acute blood  loss anemia Status post transfusion and hemoglobin is stable  Chronic pain and anxiety Patient is on multiple medications including BuSpar morphine Norco and Klonopin.  She is under hospice care for thse meds Constipation We will start her on MiraLAX  Family/ staff Communication  Labs/tests ordered:  Total time spent in this patient care encounter was 45_ minutes; greater than 50% of the visit spent counseling patient, reviewing records , Labs and coordinating care for problems addressed at this encounter.

## 2017-02-22 ENCOUNTER — Other Ambulatory Visit: Payer: Self-pay

## 2017-02-22 ENCOUNTER — Telehealth: Payer: Self-pay | Admitting: Family Medicine

## 2017-02-22 MED ORDER — HYDROCODONE-ACETAMINOPHEN 5-325 MG PO TABS
1.0000 | ORAL_TABLET | Freq: Four times a day (QID) | ORAL | 0 refills | Status: AC | PRN
Start: 2017-02-22 — End: ?

## 2017-02-22 NOTE — Telephone Encounter (Signed)
This encounter was created in error - please disregard.

## 2017-02-22 NOTE — Telephone Encounter (Signed)
This was completed thank you 

## 2017-02-22 NOTE — Telephone Encounter (Signed)
Please sign handicap placard form in yellow box.  Fax back to [facility] at number on cover sheet when complete.

## 2017-02-22 NOTE — Telephone Encounter (Signed)
RX Fax for [facility]@ [phone]  

## 2017-02-27 ENCOUNTER — Encounter (HOSPITAL_COMMUNITY)
Admission: RE | Admit: 2017-02-27 | Discharge: 2017-02-27 | Disposition: A | Discharge status: Home / Self Care | Payer: Medicare Other | Source: Skilled Nursing Facility | Attending: *Deleted | Admitting: *Deleted

## 2017-02-27 LAB — BASIC METABOLIC PANEL
Anion gap: 15 (ref 5–15)
BUN: 56 mg/dL — AB (ref 6–20)
CO2: 29 mmol/L (ref 22–32)
CREATININE: 2.14 mg/dL — AB (ref 0.44–1.00)
Calcium: 8.5 mg/dL — ABNORMAL LOW (ref 8.9–10.3)
Chloride: 96 mmol/L — ABNORMAL LOW (ref 101–111)
GFR calc non Af Amer: 21 mL/min — ABNORMAL LOW (ref >60)
GFR, EST AFRICAN AMERICAN: 24 mL/min — AB (ref >60)
Glucose, Bld: 122 mg/dL — ABNORMAL HIGH (ref 65–99)
Potassium: 3.7 mmol/L (ref 3.5–5.1)
SODIUM: 140 mmol/L (ref 135–145)

## 2017-02-27 LAB — CBC
HCT: 32 % — ABNORMAL LOW (ref 36.0–46.0)
Hemoglobin: 10.2 g/dL — ABNORMAL LOW (ref 12.0–15.0)
MCH: 27.8 pg (ref 26.0–34.0)
MCHC: 31.9 g/dL (ref 30.0–36.0)
MCV: 87.2 fL (ref 78.0–100.0)
Platelets: 229 10*3/uL (ref 150–400)
RBC: 3.67 MIL/uL — ABNORMAL LOW (ref 3.87–5.11)
RDW: 13.6 % (ref 11.5–15.5)
WBC: 4.6 10*3/uL (ref 4.0–10.5)

## 2017-03-03 ENCOUNTER — Inpatient Hospital Stay (INDEPENDENT_AMBULATORY_CARE_PROVIDER_SITE_OTHER): Payer: Medicare Other

## 2017-03-03 ENCOUNTER — Encounter (INDEPENDENT_AMBULATORY_CARE_PROVIDER_SITE_OTHER): Payer: Self-pay | Admitting: Orthopaedic Surgery

## 2017-03-03 ENCOUNTER — Ambulatory Visit (INDEPENDENT_AMBULATORY_CARE_PROVIDER_SITE_OTHER): Payer: Medicare Other | Admitting: Orthopaedic Surgery

## 2017-03-03 DIAGNOSIS — M25551 Pain in right hip: Secondary | ICD-10-CM | POA: Diagnosis not present

## 2017-03-03 DIAGNOSIS — S72001D Fracture of unspecified part of neck of right femur, subsequent encounter for closed fracture with routine healing: Secondary | ICD-10-CM

## 2017-03-03 NOTE — Progress Notes (Signed)
Patient is two-week status post right partial hip replacement for femoral neck fracture.  She is doing well and is scheduled to be discharged from the nursing facility soon.  She denies any pain.  Her incision is healed.  No signs of infection.  Leg lengths are equal.  X-rays show stable alignment of the partial hip replacement.  At this point she can discontinue Lovenox.  She can start aspirin 325 daily for 4 weeks.  No hip precautions.  Up with physical therapy.  Follow-up in 4 weeks with AP pelvis x-ray.

## 2017-03-06 ENCOUNTER — Non-Acute Institutional Stay (SKILLED_NURSING_FACILITY): Payer: Medicare Other | Admitting: Internal Medicine

## 2017-03-06 ENCOUNTER — Encounter: Payer: Self-pay | Admitting: Internal Medicine

## 2017-03-06 DIAGNOSIS — J439 Emphysema, unspecified: Secondary | ICD-10-CM

## 2017-03-06 DIAGNOSIS — S72001D Fracture of unspecified part of neck of right femur, subsequent encounter for closed fracture with routine healing: Secondary | ICD-10-CM

## 2017-03-06 DIAGNOSIS — N183 Chronic kidney disease, stage 3 unspecified: Secondary | ICD-10-CM

## 2017-03-06 DIAGNOSIS — I10 Essential (primary) hypertension: Secondary | ICD-10-CM | POA: Diagnosis not present

## 2017-03-06 DIAGNOSIS — I5032 Chronic diastolic (congestive) heart failure: Secondary | ICD-10-CM | POA: Diagnosis not present

## 2017-03-06 NOTE — Progress Notes (Signed)
Location:   [facility] Room Number: 125/P Place of Service:  SNF (31)  Provider: Edmon Crape  PCP: Babs Sciara, MD Patient Care Team: Babs Sciara, MD as PCP - General  Extended Emergency Contact Information Primary Emergency Contact: Philbert Riser Address: [address]          [place], [place] [address] [place] Home Phone: [phone] Relation: Spouse Secondary Emergency Contact: Snead,Stacy Address: [address]          [place], [place] [address] [place] Mobile Phone: [phone] Relation: Grandaughter  Code Status: DNR Goals of care:  Advanced Directive information Advanced Directives 02/20/2017  Does Patient Have a Medical Advance Directive? Yes  Type of Advance Directive Out of facility DNR (pink MOST or yellow form)  Does patient want to make changes to medical advance directive? No - Patient declined  Copy of Healthcare Power of Attorney in Chart? -  Would patient like information on creating a medical advance directive? No - Patient declined  Pre-existing out of facility DNR order (yellow form or pink MOST form) -     Allergies  Allergen Reactions  . Amoxil [Amoxicillin]     Thrush each times she takes it  . Codeine Nausea And Vomiting  . Dilaudid [Hydromorphone Hcl] Other (See Comments)    Extremely sensitive  . Erythromycin Other (See Comments)    Intense stomach pain  . Sulfonamide Derivatives     unknown    No chief complaint on file.   HPI:  82 y.o. female seen today for possible discharge from facility.  She is here for rehab after sustaining a right hip fracture after a fall that was surgically repaired with an ORIF on February 12, 2017.  She has multiple other issues including end-stage COPD as well as diastolic CHF she is on chronic oxygen as well as chronic pain chronic kidney disease and hypertension.  At one point she was under hospice care but is no longer under hospice  services-  In regards to her hip fracture with repair she has done quite well with this has worked well with therapy and gained strength-she will be going home with her husband who is very supportive she will need continued PT and OT as well as CNA support to help with her activities of daily living as well as a nurse to follow-up her multiple medical issues.  She has seen orthopedics and thought to be doing well she is weightbearing as tolerated her Lovenox has been discontinued she is now on aspirin for 4 weeks for DVT prophylaxis  Regards to end-stage COPD she is requiring chronic oxygen continues also on routine Symbicort she is on Proventil as well as Atrovent nebulizers as needed.  This is been relatively stable.  She also has a history of diastolic CHF is on metolazone as well as Lasix with potassium supplementation her weight has been stable she does not really have significant edema this appears to be relatively stable.  She also has a history of chronic kidney disease creatinine was 2.14 on lab done on January 14 which appears to be relatively baseline for her.  Regards her chronic pain she does receive Norco and this is giving her relief.  She also has a history of hypertension which appears stable on Cozaar as well as Norvasc and Lopressor recent blood pressure is 115/51-118/62- 141/62.--134/65  She does have a history of anxiety and has been on Klonopin long-term at night  we will extend this.  She also has a history of anemia she is on iron status post surgery will update a CBC before discharge hemoglobin was 10.0 on lab done January 14.  Currently she has no complaints she is looking forward to going home apparently there is still some uncertainty about how her oxygen will be provided via home health and this may delay her discharge.     Past Medical History:  Diagnosis Date  . Acute on chronic diastolic heart failure (HCC) 04/11/2014  . Anxiety    [name] 02/15/2017  .  Arthritis    "joints ache all the time" (02/15/2017)  . Asthma   . Bradycardia   . CAD (coronary artery disease) 08/2009   s/p PCI of the LAD  . Carotid artery stenosis   . CHF (congestive heart failure) (HCC)    [name] 02/15/2017  . Chronic bronchitis (HCC)   . CKD (chronic kidney disease), stage III (HCC)    [name] 02/15/2017  . COPD (chronic obstructive pulmonary disease) (HCC)   . Daily headache    "since I had shingles on my face/head" (02/15/2017)  . Dementia    [name] 02/15/2017  . GERD (gastroesophageal reflux disease)   . Heart murmur   . History of blood transfusion 1970s; 02/15/2017   "S/P miscarriage; low blood" (02/15/2017)  . Hyperlipidemia   . Hypertension   . Impaired fasting glucose   . NSTEMI (non-ST elevated myocardial infarction) (HCC)   . On home O2    "4 1/2L w/100 foot cord; 24/7" (02/15/2017)  . Osteoporosis 2009  . Pneumonia    "several times" (02/15/2017)  . Seasonal allergies   . Ulnar neuropathy     Past Surgical History:  Procedure Laterality Date  . ANTERIOR APPROACH HEMI HIP ARTHROPLASTY Right 02/12/2017   Procedure: ANTERIOR APPROACH HEMI HIP ARTHROPLASTY;  Surgeon: Tarry Kos, MD;  Location: [facility] OR;  Service: Orthopedics;  Laterality: Right;  . CARDIAC CATHETERIZATION  11/2010   negative  . CARPAL TUNNEL RELEASE Right 2008   [name] 06/29/2010  . CATARACT EXTRACTION W/ INTRAOCULAR LENS  IMPLANT, BILATERAL Bilateral 12/2014  . CORONARY ANGIOPLASTY WITH STENT PLACEMENT    . DILATION AND CURETTAGE OF UTERUS    . FRACTURE SURGERY    . INCONTINENCE SURGERY  1991   "attached it to my pelvic bone"  . LEFT HEART CATHETERIZATION WITH CORONARY ANGIOGRAM N/A 11/05/2012   Procedure: LEFT HEART CATHETERIZATION WITH CORONARY ANGIOGRAM;  Surgeon: Micheline Chapman, MD;  Location: [facility] CATH LAB;  Service: Cardiovascular;  Laterality: N/A;  . VAGINAL HYSTERECTOMY  1984   partial      reports that she quit smoking about 9 years ago. She has a 35.00 pack-year smoking  history. she has never used smokeless tobacco. She reports that she does not drink alcohol or use drugs. Social History   Socioeconomic History  . Marital status: Married    Spouse name: Not on file  . Number of children: Not on file  . Years of education: Not on file  . Highest education level: Not on file  Social Needs  . Financial resource strain: Not on file  . Food insecurity - worry: Not on file  . Food insecurity - inability: Not on file  . Transportation needs - medical: Not on file  . Transportation needs - non-medical: Not on file  Occupational History  . Occupation: Retired    Associate Professor: RETIRED    Comment: Scientist, product/process development  Tobacco Use  . Smoking  status: Former Smoker    Packs/day: 1.00    Years: 35.00    Pack years: 35.00    Last attempt to quit: 02/15/2008    Years since quitting: 9.0  . Smokeless tobacco: Never Used  Substance and Sexual Activity  . Alcohol use: No  . Drug use: No  . Sexual activity: Not Currently  Other Topics Concern  . Not on file  Social History Narrative   Lives in [place] with husband of 52 years   Granddaughter works at [employer] in [place]   Functional Status Survey:    Allergies  Allergen Reactions  . Amoxil [Amoxicillin]     Thrush each times she takes it  . Codeine Nausea And Vomiting  . Dilaudid [Hydromorphone Hcl] Other (See Comments)    Extremely sensitive  . Erythromycin Other (See Comments)    Intense stomach pain  . Sulfonamide Derivatives     unknown    Pertinent  Health Maintenance Due  Topic Date Due  . URINE MICROALBUMIN  12/03/1945  . FOOT EXAM  03/23/2017 (Originally 12/04/2014)  . HEMOGLOBIN A1C  03/23/2017 (Originally 10/22/2015)  . OPHTHALMOLOGY EXAM  03/23/2017 (Originally 12/03/1945)  . INFLUENZA VACCINE  Completed  . DEXA SCAN  Completed  . PNA vac Low Risk Adult  Completed    Medications: Outpatient Encounter Medications as of 03/06/2017  Medication Sig  . acetaminophen (TYLENOL) 500 MG  tablet Take 500 mg by mouth every 4 (four) hours.  Marland Kitchen albuterol (PROVENTIL HFA;VENTOLIN HFA) 108 (90 Base) MCG/ACT inhaler Inhale 2 puffs into the lungs every 4 (four) hours as needed for wheezing or shortness of breath.  Marland Kitchen albuterol (PROVENTIL) (2.5 MG/3ML) 0.083% nebulizer solution Take 3 mLs (2.5 mg total) by nebulization every 4 (four) hours as needed for wheezing or shortness of breath.  Marland Kitchen amLODipine (NORVASC) 5 MG tablet TAKE 1/2 TABLET BY MOUTH DAILY  . aspirin EC 325 MG tablet Take 325 mg by mouth daily. From 03/04/2017-03/31/2017  . aspirin EC 81 MG tablet Take 81 mg by mouth daily.  . budesonide-formoterol (SYMBICORT) 160-4.5 MCG/ACT inhaler Inhale 2 puffs into the lungs 2 (two) times daily.  . busPIRone (BUSPAR) 5 MG tablet Take 5 mg by mouth 3 (three) times daily.  . Calcium Carb-Cholecalciferol (CALCIUM 600 + D) 600-200 MG-UNIT TABS Take 1 tablet by mouth daily.  . cetirizine (ZYRTEC) 10 MG tablet Take 10 mg by mouth daily.   Marland Kitchen erythromycin ophthalmic ointment Place 1 application into the right eye 4 (four) times daily.  . ferrous sulfate (KP FERROUS SULFATE) 325 (65 FE) MG tablet Take 325 mg by mouth daily with breakfast.  . furosemide (LASIX) 40 MG tablet Take 120 mg by mouth 2 (two) times daily.  Marland Kitchen HYDROcodone-acetaminophen (NORCO) 5-325 MG tablet Take 1-2 tablets by mouth every 6 (six) hours as needed.  Marland Kitchen ipratropium (ATROVENT) 0.02 % nebulizer solution USE 1 VIAL VIA NEBULIZER EVERY 6 HOURS AS NEEDED FOR WHEEZING OR SHORTNESS OF BREATH  . losartan (COZAAR) 25 MG tablet TAKE 1 TABLET BY MOUTH DAILY  . meclizine (ANTIVERT) 12.5 MG tablet Take 12.5 mg by mouth every 6 (six) hours as needed for dizziness.  . metolazone (ZAROXOLYN) 2.5 MG tablet TAKE 1 TABLET BY MOUTH DAILY ON TUESDAYS AND THURSDAYS  . metoprolol tartrate (LOPRESSOR) 25 MG tablet TAKE 1 TABLET BY MOUTH TWICE DAILY  . mupirocin ointment (BACTROBAN) 2 % Place 1 application into the nose 2 (two) times daily. Apply to  incision to right hip  .  nitroGLYCERIN (NITROSTAT) 0.4 MG SL tablet Place 1 tablet (0.4 mg total) under the tongue as needed for chest pain.  Marland Kitchen omeprazole (PRILOSEC) 20 MG capsule Take 1 capsule (20 mg total) by mouth daily.  . ondansetron (ZOFRAN ODT) 8 MG disintegrating tablet Take 1 tablet (8 mg total) by mouth every 8 (eight) hours as needed for nausea or vomiting.  . polyethylene glycol (MIRALAX / GLYCOLAX) packet Take 17 g by mouth daily as needed for moderate constipation.  . potassium chloride SA (K-DUR,KLOR-CON) 20 MEQ tablet TAKE 1 TABLET(20 MEQ) BY MOUTH TWICE DAILY  . sertraline (ZOLOFT) 100 MG tablet TAKE 1& 1/2 TABLETS BY MOUTH DAILY  . Simethicone (GAS RELIEF PO) Take 1 tablet by mouth daily as needed (gas relief).  . [DISCONTINUED] busPIRone (BUSPAR) 15 MG tablet Take 1 tablet (15 mg total) by mouth 3 (three) times daily.  . [DISCONTINUED] clonazePAM (KLONOPIN) 0.5 MG tablet Take 0.5 tablets (0.25 mg total) by mouth at bedtime as needed for anxiety.  . [DISCONTINUED] enoxaparin (LOVENOX) 40 MG/0.4ML injection Inject 0.4 mLs (40 mg total) into the skin daily.  . [DISCONTINUED] morphine 20 MG/5ML solution Take 2.5 mg by mouth every 4 (four) hours as needed for pain.  . [DISCONTINUED] morphine 20 MG/5ML solution Take 5 mg by mouth every 4 (four) hours as needed for pain.   No facility-administered encounter medications on file as of 03/06/2017.      Review of Systems   In general she is not complaining of any fever chills weight appears to be stable.  Skin does not complain of rashes or itching surgical site right hip appears to be healing unremarkably.  Head ears eyes nose mouth and throat is not complaining of visual changes from baseline or sore throat.  Respiratory does have a history of end-stage COPD chronic oxygen dependent  But  at this timedoesmnot complain of increased cough or shortness of breath beyond baseline.  Cardiac-does not complain of chest pain does not  have significant lower extremity edema.  GI is not complaining of abdominal discomfort nausea vomiting diarrhea does not complain of constipation at this point at one point did but this appears to have improved.  GU does not complain of dysuria.  Musculoskeletal continues for some weakness but apparently this is improving-pain at this point appears to be controlled with the Norco.  Neurologic does not complain of dizziness headache or numbness.  And psych does not complain of overt depression or anxiety.    Vitals:   03/06/17 1459  BP: 134/65  Pulse: 74  Resp: 20  Temp: 98.4 F (36.9 C)  TempSrc: Oral  SpO2: 94%  Weight is 162 pounds Physical Exam  In general this is a pleasant elderly female in no distress sitting comfortably in her wheelchair.  Her skin is warm and dry surgical site right hip appears to be healing unremarkably there is residual healing erythema and scarring no sign of infection. She also has some brawny bruising where Lovenox injections were performed on her stomach  Eyes visual acuity appears grossly intact she does have some ointment applied to her right eye.  Oropharynx is clear mucous membranes moist.  Chest is clear to auscultation with shallow air entry there is no labored breathing.  Heart is regular rate and rhythm with a mild systolic murmur she does not have significant lower extremity edema pedal pulses are intact bilaterally.  Her abdomen is obese soft nontender with positive bowel sounds  musculoskeletal does move all her extremities x4  is able to stand without assistance still has some lower extremity weakness when ambulating however  Neurologic is grossly intact her speech is clear no lateralizing findings.  Psych she is alert and oriented pleasant and appropriate  Labs reviewed: Basic Metabolic Panel: Recent Labs    02/11/17 1031 02/12/17 0314 02/13/17 0609  02/16/17 0903 02/18/17 0415 02/27/17 0500  NA 137 139 136   < > 139  141 140  K 2.8* 3.6 3.0*   < > 3.2* 3.7 3.7  CL 94* 94* 88*   < > 101 100* 96*  CO2 32 35* 31   < > 27 28 29   GLUCOSE 156* 146* 149*   < > 194* 132* 122*  BUN 38* 36* 41*   < > 49* 41* 56*  CREATININE 1.67* 1.67* 1.96*   < > 1.87* 1.50* 2.14*  CALCIUM 8.7* 9.0 7.8*   < > 8.3* 8.6* 8.5*  MG 1.9 1.9 1.8  --   --   --   --   PHOS 3.7 3.3 3.6  --   --   --   --    < > = values in this interval not displayed.   Liver Function Tests: Recent Labs    02/11/17 1031 02/12/17 0314 02/13/17 0609  AST 20 18 20   ALT 15 13* 10*  ALKPHOS 37* 40 37*  BILITOT 0.7 0.7 0.6  PROT 6.5 7.1 6.4*  ALBUMIN 3.9 4.0 3.5   No results for input(s): LIPASE, AMYLASE in the last 8760 hours. No results for input(s): AMMONIA in the last 8760 hours. CBC: Recent Labs    02/12/17 0314 02/13/17 0609  02/16/17 0903 02/18/17 0415 02/27/17 0500  WBC 7.5 7.8   < > 6.4 5.8 4.6  NEUTROABS 6.2 6.9  --   --  4.6  --   HGB 10.6* 8.5*   < > 10.3* 10.7* 10.2*  HCT 32.6* 25.8*   < > 30.7* 32.8* 32.0*  MCV 88.6 87.8   < > 85.8 86.8 87.2  PLT 147* 150   < > 156 196 229   < > = values in this interval not displayed.   Cardiac Enzymes: No results for input(s): CKTOTAL, CKMB, CKMBINDEX, TROPONINI in the last 8760 hours. BNP: Invalid input(s): POCBNP CBG: No results for input(s): GLUCAP in the last 8760 hours.  Procedures and Imaging Studies During Stay: Dg Chest 1 View  Result Date: 02/10/2017 CLINICAL DATA:  Fall. Preoperative respiratory evaluation for hip fracture. EXAM: CHEST 1 VIEW COMPARISON:  09/19/2016 FINDINGS: The lungs are clear without focal pneumonia, edema, pneumothorax or pleural effusion. Hyperexpansion suggests emphysema. Interstitial markings are diffusely coarsened with chronic features. The cardio pericardial silhouette is enlarged. Multiple bilateral old rib fractures evident. Telemetry leads overlie the chest. IMPRESSION: Cardiomegaly with emphysema.  No acute cardiopulmonary findings.  Electronically Signed   By: Kennith Center M.D.   On: 02/10/2017 18:17   Pelvis Portable  Result Date: 02/12/2017 CLINICAL DATA:  Anterior right hip replacement. EXAM: PORTABLE PELVIS 1-2 VIEWS COMPARISON:  Intraoperative images same date. Preoperative exam of 02/10/2017. FINDINGS: Interval right hip arthroplasty without hardware complication or periprosthetic fracture. Left femoral head is located. Sacroiliac joints are symmetric. Expected postoperative soft tissue gas and lateral surgical staples. IMPRESSION: Expected appearance after right hip arthroplasty. Electronically Signed   By: Jeronimo Greaves M.D.   On: 02/12/2017 10:31   Dg Chest Port 1 View  Result Date: 02/14/2017 CLINICAL DATA:  Hypoxia EXAM: PORTABLE CHEST 1 VIEW COMPARISON:  02/10/2017 FINDINGS: Cardiac shadow is enlarged but stable. Old rib fractures are noted bilaterally. The lungs are well aerated bilaterally. No focal infiltrate is seen. No sizable effusion is noted. IMPRESSION: No active disease. Electronically Signed   By: Alcide Clever M.D.   On: 02/14/2017 09:53   Dg C-arm 1-60 Min  Result Date: 02/12/2017 CLINICAL DATA:  Femoral neck fracture EXAM: DG C-ARM 61-120 MIN; OPERATIVE RIGHT HIP WITH PELVIS COMPARISON:  02/10/2017 FINDINGS: Right hip hemiarthroplasty has been placed. There is anatomic alignment of the osseous and prosthetic structures. No breakage or loosening of the hardware. IMPRESSION: Right hip hemiarthroplasty anatomically aligned. Electronically Signed   By: Jolaine Click M.D.   On: 02/12/2017 10:28   Dg Hip Operative Unilat W Or W/o Pelvis Right  Result Date: 02/12/2017 CLINICAL DATA:  Femoral neck fracture EXAM: DG C-ARM 61-120 MIN; OPERATIVE RIGHT HIP WITH PELVIS COMPARISON:  02/10/2017 FINDINGS: Right hip hemiarthroplasty has been placed. There is anatomic alignment of the osseous and prosthetic structures. No breakage or loosening of the hardware. IMPRESSION: Right hip hemiarthroplasty anatomically aligned.  Electronically Signed   By: Jolaine Click M.D.   On: 02/12/2017 10:28   Dg Hip Unilat With Pelvis 2-3 Views Right  Result Date: 02/10/2017 CLINICAL DATA:  Preop.  Fall with right hip pain. EXAM: DG HIP (WITH OR WITHOUT PELVIS) 2-3V RIGHT COMPARISON:  12/16/2015 FINDINGS: AP view the pelvis and AP/frog leg views of the right hip. Comminuted, impacted subcapital or mid right femoral neck fracture with minimal varus angulation. No dislocation. Sacroiliac joints are symmetric. Support apparatus overlies the left hemipelvis, without gross left-sided abnormality identified. IMPRESSION: Right femoral neck fracture with impaction and mild varus angulation. Electronically Signed   By: Jeronimo Greaves M.D.   On: 02/10/2017 18:21   Xr Hip Unilat W Or W/o Pelvis 2-3 Views Right  Result Date: 03/03/2017 Stable right partial hip replacement in good alignment.   Assessment/Plan:     #1 history of right hip fracture with repair she appears to have done well with this she has gained strength she will need continued PT and OT if she goes home for DVT prophylaxis Lovenox has been discontinued and she is now on aspirin for 4 weeks  #2 history of end-stage COPD this appears to be stable even though again quite a challenge she is on chronic oxygen continues on as needed nebulizers as well as as needed Proventil her Symbicort has been made routine.  3.  Diastolic CHF again this appears stable weight is stable she does not have significant edema continues on Lasix metolazone and potassium supplementation will update a metabolic panel before discharge.  4.  History of chronic pain again this appears to be controlled on her Norco which she receives every 6 hours as needed she only takes this appears about once a day.  5.-History of chronic kidney disease this appears relatively stable with a creatinine of 2.14 on lab done on January 14 will update this before discharge.  6.  History of hypertension as noted above this  appears stable on losartan Norvasc and Lopressor.  7.  History of anemia again she is on iron hemoglobin has shown stability will update this before discharge-again there is an element expected blood loss anemia from the surgery.  .  8.  History of anxiety and depression-she has been on Klonopin apparently long-term as needed at night will extend this-she also is on BuSpar-regards to depression on Zoloft and this appears to be stable and well  controlled currently.  Again she will require oxygen before she goes home apparently there is still some discussion about how this can best be achieved-this may delay her discharge secondary to home health issues.  Clinically she appears to be doing well- but she will need PT and OT when she does go home as well as nursing support for her multiple medical issues and CNA support to help with her activities of daily living with her significant weakness with her comorbidities-  [id number] note greater than 30 minutes spent on this discharge summary- greater than 50% of time spent coordinating a plan of care for numerous diagnoses

## 2017-03-08 ENCOUNTER — Ambulatory Visit: Payer: Medicare Other | Admitting: Family Medicine

## 2017-03-10 ENCOUNTER — Encounter: Payer: Self-pay | Admitting: Family Medicine

## 2017-03-10 ENCOUNTER — Other Ambulatory Visit: Payer: Self-pay | Admitting: Nurse Practitioner

## 2017-03-10 MED ORDER — MAGIC MOUTHWASH W/LIDOCAINE: 5.0000 mL | Freq: Four times a day (QID) | ORAL | 0 refills | Status: DC | PRN

## 2017-03-11 DIAGNOSIS — I2510 Atherosclerotic heart disease of native coronary artery without angina pectoris: Secondary | ICD-10-CM | POA: Diagnosis not present

## 2017-03-11 DIAGNOSIS — G8929 Other chronic pain: Secondary | ICD-10-CM | POA: Diagnosis not present

## 2017-03-11 DIAGNOSIS — D631 Anemia in chronic kidney disease: Secondary | ICD-10-CM | POA: Diagnosis not present

## 2017-03-11 DIAGNOSIS — Z96641 Presence of right artificial hip joint: Secondary | ICD-10-CM | POA: Diagnosis not present

## 2017-03-11 DIAGNOSIS — F419 Anxiety disorder, unspecified: Secondary | ICD-10-CM | POA: Diagnosis not present

## 2017-03-11 DIAGNOSIS — J441 Chronic obstructive pulmonary disease with (acute) exacerbation: Secondary | ICD-10-CM | POA: Diagnosis not present

## 2017-03-11 DIAGNOSIS — Z7982 Long term (current) use of aspirin: Secondary | ICD-10-CM | POA: Diagnosis not present

## 2017-03-11 DIAGNOSIS — Z87891 Personal history of nicotine dependence: Secondary | ICD-10-CM | POA: Diagnosis not present

## 2017-03-11 DIAGNOSIS — M1990 Unspecified osteoarthritis, unspecified site: Secondary | ICD-10-CM | POA: Diagnosis not present

## 2017-03-11 DIAGNOSIS — I130 Hypertensive heart and chronic kidney disease with heart failure and stage 1 through stage 4 chronic kidney disease, or unspecified chronic kidney disease: Secondary | ICD-10-CM | POA: Diagnosis not present

## 2017-03-11 DIAGNOSIS — Z7951 Long term (current) use of inhaled steroids: Secondary | ICD-10-CM | POA: Diagnosis not present

## 2017-03-11 DIAGNOSIS — Z955 Presence of coronary angioplasty implant and graft: Secondary | ICD-10-CM | POA: Diagnosis not present

## 2017-03-11 DIAGNOSIS — F0390 Unspecified dementia without behavioral disturbance: Secondary | ICD-10-CM | POA: Diagnosis not present

## 2017-03-11 DIAGNOSIS — K219 Gastro-esophageal reflux disease without esophagitis: Secondary | ICD-10-CM | POA: Diagnosis not present

## 2017-03-11 DIAGNOSIS — I5032 Chronic diastolic (congestive) heart failure: Secondary | ICD-10-CM | POA: Diagnosis not present

## 2017-03-11 DIAGNOSIS — Z9981 Dependence on supplemental oxygen: Secondary | ICD-10-CM | POA: Diagnosis not present

## 2017-03-11 DIAGNOSIS — M80051D Age-related osteoporosis with current pathological fracture, right femur, subsequent encounter for fracture with routine healing: Secondary | ICD-10-CM | POA: Diagnosis not present

## 2017-03-11 DIAGNOSIS — F329 Major depressive disorder, single episode, unspecified: Secondary | ICD-10-CM | POA: Diagnosis not present

## 2017-03-11 DIAGNOSIS — Z9181 History of falling: Secondary | ICD-10-CM | POA: Diagnosis not present

## 2017-03-11 DIAGNOSIS — N183 Chronic kidney disease, stage 3 (moderate): Secondary | ICD-10-CM | POA: Diagnosis not present

## 2017-03-13 ENCOUNTER — Telehealth: Payer: Self-pay | Admitting: Family Medicine

## 2017-03-13 NOTE — Telephone Encounter (Signed)
Verbal order given to Huntley Dec at [facility]

## 2017-03-13 NOTE — Telephone Encounter (Signed)
That would be fine 

## 2017-03-13 NOTE — Telephone Encounter (Signed)
Huntley Dec, nurse with [facility], requesting start of care order for nursing for 1 time a week for 9 weeks, also home health aid order for 2 times a week for 4 weeks for bathing.

## 2017-03-14 ENCOUNTER — Other Ambulatory Visit: Payer: Self-pay | Admitting: Family Medicine

## 2017-03-15 DIAGNOSIS — M80051D Age-related osteoporosis with current pathological fracture, right femur, subsequent encounter for fracture with routine healing: Secondary | ICD-10-CM | POA: Diagnosis not present

## 2017-03-15 DIAGNOSIS — I130 Hypertensive heart and chronic kidney disease with heart failure and stage 1 through stage 4 chronic kidney disease, or unspecified chronic kidney disease: Secondary | ICD-10-CM | POA: Diagnosis not present

## 2017-03-15 DIAGNOSIS — I5032 Chronic diastolic (congestive) heart failure: Secondary | ICD-10-CM | POA: Diagnosis not present

## 2017-03-15 DIAGNOSIS — J441 Chronic obstructive pulmonary disease with (acute) exacerbation: Secondary | ICD-10-CM | POA: Diagnosis not present

## 2017-03-15 DIAGNOSIS — Z96641 Presence of right artificial hip joint: Secondary | ICD-10-CM | POA: Diagnosis not present

## 2017-03-15 DIAGNOSIS — I2510 Atherosclerotic heart disease of native coronary artery without angina pectoris: Secondary | ICD-10-CM | POA: Diagnosis not present

## 2017-03-15 NOTE — Telephone Encounter (Signed)
Patient now out of rehab.  I believe she is at home.  Verify with family her husband or granddaughter that she is on this medicine if so she may have 6 refills

## 2017-03-17 DIAGNOSIS — M80051D Age-related osteoporosis with current pathological fracture, right femur, subsequent encounter for fracture with routine healing: Secondary | ICD-10-CM | POA: Diagnosis not present

## 2017-03-17 DIAGNOSIS — I130 Hypertensive heart and chronic kidney disease with heart failure and stage 1 through stage 4 chronic kidney disease, or unspecified chronic kidney disease: Secondary | ICD-10-CM | POA: Diagnosis not present

## 2017-03-17 DIAGNOSIS — Z96641 Presence of right artificial hip joint: Secondary | ICD-10-CM | POA: Diagnosis not present

## 2017-03-17 DIAGNOSIS — I5032 Chronic diastolic (congestive) heart failure: Secondary | ICD-10-CM | POA: Diagnosis not present

## 2017-03-17 DIAGNOSIS — J441 Chronic obstructive pulmonary disease with (acute) exacerbation: Secondary | ICD-10-CM | POA: Diagnosis not present

## 2017-03-17 DIAGNOSIS — I2510 Atherosclerotic heart disease of native coronary artery without angina pectoris: Secondary | ICD-10-CM | POA: Diagnosis not present

## 2017-03-17 NOTE — Telephone Encounter (Signed)
She may have a refill of BuSpar 15 mg 1 3 times daily, #90, 5 refills, the previous dosage of 5 mg in the chart was at goal dosage.  Thank you

## 2017-03-20 DIAGNOSIS — Z96641 Presence of right artificial hip joint: Secondary | ICD-10-CM | POA: Diagnosis not present

## 2017-03-20 DIAGNOSIS — J441 Chronic obstructive pulmonary disease with (acute) exacerbation: Secondary | ICD-10-CM | POA: Diagnosis not present

## 2017-03-20 DIAGNOSIS — I2510 Atherosclerotic heart disease of native coronary artery without angina pectoris: Secondary | ICD-10-CM | POA: Diagnosis not present

## 2017-03-20 DIAGNOSIS — M80051D Age-related osteoporosis with current pathological fracture, right femur, subsequent encounter for fracture with routine healing: Secondary | ICD-10-CM | POA: Diagnosis not present

## 2017-03-21 DIAGNOSIS — I2510 Atherosclerotic heart disease of native coronary artery without angina pectoris: Secondary | ICD-10-CM | POA: Diagnosis not present

## 2017-03-21 DIAGNOSIS — I5032 Chronic diastolic (congestive) heart failure: Secondary | ICD-10-CM | POA: Diagnosis not present

## 2017-03-21 DIAGNOSIS — Z96641 Presence of right artificial hip joint: Secondary | ICD-10-CM | POA: Diagnosis not present

## 2017-03-21 DIAGNOSIS — M80051D Age-related osteoporosis with current pathological fracture, right femur, subsequent encounter for fracture with routine healing: Secondary | ICD-10-CM | POA: Diagnosis not present

## 2017-03-21 DIAGNOSIS — I130 Hypertensive heart and chronic kidney disease with heart failure and stage 1 through stage 4 chronic kidney disease, or unspecified chronic kidney disease: Secondary | ICD-10-CM | POA: Diagnosis not present

## 2017-03-21 DIAGNOSIS — J441 Chronic obstructive pulmonary disease with (acute) exacerbation: Secondary | ICD-10-CM | POA: Diagnosis not present

## 2017-03-22 DIAGNOSIS — M80051D Age-related osteoporosis with current pathological fracture, right femur, subsequent encounter for fracture with routine healing: Secondary | ICD-10-CM | POA: Diagnosis not present

## 2017-03-22 DIAGNOSIS — I2510 Atherosclerotic heart disease of native coronary artery without angina pectoris: Secondary | ICD-10-CM | POA: Diagnosis not present

## 2017-03-22 DIAGNOSIS — I5032 Chronic diastolic (congestive) heart failure: Secondary | ICD-10-CM | POA: Diagnosis not present

## 2017-03-22 DIAGNOSIS — J441 Chronic obstructive pulmonary disease with (acute) exacerbation: Secondary | ICD-10-CM | POA: Diagnosis not present

## 2017-03-22 DIAGNOSIS — Z96641 Presence of right artificial hip joint: Secondary | ICD-10-CM | POA: Diagnosis not present

## 2017-03-22 DIAGNOSIS — I130 Hypertensive heart and chronic kidney disease with heart failure and stage 1 through stage 4 chronic kidney disease, or unspecified chronic kidney disease: Secondary | ICD-10-CM | POA: Diagnosis not present

## 2017-03-23 DIAGNOSIS — M80051D Age-related osteoporosis with current pathological fracture, right femur, subsequent encounter for fracture with routine healing: Secondary | ICD-10-CM | POA: Diagnosis not present

## 2017-03-23 DIAGNOSIS — I5032 Chronic diastolic (congestive) heart failure: Secondary | ICD-10-CM | POA: Diagnosis not present

## 2017-03-23 DIAGNOSIS — Z96641 Presence of right artificial hip joint: Secondary | ICD-10-CM | POA: Diagnosis not present

## 2017-03-23 DIAGNOSIS — I2510 Atherosclerotic heart disease of native coronary artery without angina pectoris: Secondary | ICD-10-CM | POA: Diagnosis not present

## 2017-03-23 DIAGNOSIS — J441 Chronic obstructive pulmonary disease with (acute) exacerbation: Secondary | ICD-10-CM | POA: Diagnosis not present

## 2017-03-23 DIAGNOSIS — I130 Hypertensive heart and chronic kidney disease with heart failure and stage 1 through stage 4 chronic kidney disease, or unspecified chronic kidney disease: Secondary | ICD-10-CM | POA: Diagnosis not present

## 2017-03-24 DIAGNOSIS — I130 Hypertensive heart and chronic kidney disease with heart failure and stage 1 through stage 4 chronic kidney disease, or unspecified chronic kidney disease: Secondary | ICD-10-CM | POA: Diagnosis not present

## 2017-03-24 DIAGNOSIS — J441 Chronic obstructive pulmonary disease with (acute) exacerbation: Secondary | ICD-10-CM | POA: Diagnosis not present

## 2017-03-24 DIAGNOSIS — Z96641 Presence of right artificial hip joint: Secondary | ICD-10-CM | POA: Diagnosis not present

## 2017-03-24 DIAGNOSIS — I5032 Chronic diastolic (congestive) heart failure: Secondary | ICD-10-CM | POA: Diagnosis not present

## 2017-03-24 DIAGNOSIS — M80051D Age-related osteoporosis with current pathological fracture, right femur, subsequent encounter for fracture with routine healing: Secondary | ICD-10-CM | POA: Diagnosis not present

## 2017-03-24 DIAGNOSIS — I2510 Atherosclerotic heart disease of native coronary artery without angina pectoris: Secondary | ICD-10-CM | POA: Diagnosis not present

## 2017-03-27 DIAGNOSIS — J441 Chronic obstructive pulmonary disease with (acute) exacerbation: Secondary | ICD-10-CM | POA: Diagnosis not present

## 2017-03-27 DIAGNOSIS — I130 Hypertensive heart and chronic kidney disease with heart failure and stage 1 through stage 4 chronic kidney disease, or unspecified chronic kidney disease: Secondary | ICD-10-CM | POA: Diagnosis not present

## 2017-03-27 DIAGNOSIS — M80051D Age-related osteoporosis with current pathological fracture, right femur, subsequent encounter for fracture with routine healing: Secondary | ICD-10-CM | POA: Diagnosis not present

## 2017-03-27 DIAGNOSIS — I5032 Chronic diastolic (congestive) heart failure: Secondary | ICD-10-CM | POA: Diagnosis not present

## 2017-03-27 DIAGNOSIS — I2510 Atherosclerotic heart disease of native coronary artery without angina pectoris: Secondary | ICD-10-CM | POA: Diagnosis not present

## 2017-03-27 DIAGNOSIS — Z96641 Presence of right artificial hip joint: Secondary | ICD-10-CM | POA: Diagnosis not present

## 2017-03-28 DIAGNOSIS — I2510 Atherosclerotic heart disease of native coronary artery without angina pectoris: Secondary | ICD-10-CM | POA: Diagnosis not present

## 2017-03-28 DIAGNOSIS — M80051D Age-related osteoporosis with current pathological fracture, right femur, subsequent encounter for fracture with routine healing: Secondary | ICD-10-CM | POA: Diagnosis not present

## 2017-03-28 DIAGNOSIS — J441 Chronic obstructive pulmonary disease with (acute) exacerbation: Secondary | ICD-10-CM | POA: Diagnosis not present

## 2017-03-28 DIAGNOSIS — I130 Hypertensive heart and chronic kidney disease with heart failure and stage 1 through stage 4 chronic kidney disease, or unspecified chronic kidney disease: Secondary | ICD-10-CM | POA: Diagnosis not present

## 2017-03-28 DIAGNOSIS — Z96641 Presence of right artificial hip joint: Secondary | ICD-10-CM | POA: Diagnosis not present

## 2017-03-28 DIAGNOSIS — I5032 Chronic diastolic (congestive) heart failure: Secondary | ICD-10-CM | POA: Diagnosis not present

## 2017-03-29 DIAGNOSIS — I130 Hypertensive heart and chronic kidney disease with heart failure and stage 1 through stage 4 chronic kidney disease, or unspecified chronic kidney disease: Secondary | ICD-10-CM | POA: Diagnosis not present

## 2017-03-29 DIAGNOSIS — I2510 Atherosclerotic heart disease of native coronary artery without angina pectoris: Secondary | ICD-10-CM | POA: Diagnosis not present

## 2017-03-29 DIAGNOSIS — M80051D Age-related osteoporosis with current pathological fracture, right femur, subsequent encounter for fracture with routine healing: Secondary | ICD-10-CM | POA: Diagnosis not present

## 2017-03-29 DIAGNOSIS — J441 Chronic obstructive pulmonary disease with (acute) exacerbation: Secondary | ICD-10-CM | POA: Diagnosis not present

## 2017-03-29 DIAGNOSIS — Z96641 Presence of right artificial hip joint: Secondary | ICD-10-CM | POA: Diagnosis not present

## 2017-03-29 DIAGNOSIS — I5032 Chronic diastolic (congestive) heart failure: Secondary | ICD-10-CM | POA: Diagnosis not present

## 2017-03-30 DIAGNOSIS — I2510 Atherosclerotic heart disease of native coronary artery without angina pectoris: Secondary | ICD-10-CM | POA: Diagnosis not present

## 2017-03-30 DIAGNOSIS — M80051D Age-related osteoporosis with current pathological fracture, right femur, subsequent encounter for fracture with routine healing: Secondary | ICD-10-CM | POA: Diagnosis not present

## 2017-03-30 DIAGNOSIS — I130 Hypertensive heart and chronic kidney disease with heart failure and stage 1 through stage 4 chronic kidney disease, or unspecified chronic kidney disease: Secondary | ICD-10-CM | POA: Diagnosis not present

## 2017-03-30 DIAGNOSIS — I5032 Chronic diastolic (congestive) heart failure: Secondary | ICD-10-CM | POA: Diagnosis not present

## 2017-03-30 DIAGNOSIS — J441 Chronic obstructive pulmonary disease with (acute) exacerbation: Secondary | ICD-10-CM | POA: Diagnosis not present

## 2017-03-30 DIAGNOSIS — Z96641 Presence of right artificial hip joint: Secondary | ICD-10-CM | POA: Diagnosis not present

## 2017-03-31 ENCOUNTER — Encounter (INDEPENDENT_AMBULATORY_CARE_PROVIDER_SITE_OTHER): Payer: Self-pay | Admitting: Orthopaedic Surgery

## 2017-03-31 ENCOUNTER — Ambulatory Visit (INDEPENDENT_AMBULATORY_CARE_PROVIDER_SITE_OTHER): Payer: Medicare Other | Admitting: Orthopaedic Surgery

## 2017-03-31 ENCOUNTER — Ambulatory Visit (INDEPENDENT_AMBULATORY_CARE_PROVIDER_SITE_OTHER): Payer: Medicare Other

## 2017-03-31 DIAGNOSIS — M25551 Pain in right hip: Secondary | ICD-10-CM

## 2017-03-31 NOTE — Progress Notes (Signed)
Post-Op Visit Note   Patient: [name]           Date of Birth: July 18, 1935           MRN: [id number] Visit Date: 03/31/2017 PCP: Babs Sciara, MD   Assessment & Plan:  Chief Complaint:  Chief Complaint  Patient presents with  . Right Hip - Pain   Visit Diagnoses:  1. Pain in right hip     Plan: [name] comes in for follow-up.  47 days status post anterior approach him E hip arthroplasty on the right, date of surgery 02/12/2017.  Doing excellent.  She has been working with home health PT twice a week.  She is regains great motion and strength.  Still utilizing a walker with ambulation.  No fevers no chills or any other systemic symptoms.  Examination of her right hip reveals a well healed surgical incision without evidence of infection or cellulitis.  She has near full hip flexion and strength.  She is neurovascular intact distally.  At this point, would like for her to continue working with home health physical therapy.  She will follow-up with [name] in 6 weeks time for repeat evaluation and x-ray.  Follow-Up Instructions: Return in about 6 weeks (around 05/12/2017).   Orders:  Orders Placed This Encounter  Procedures  . XR Pelvis 1-2 Views   No orders of the defined types were placed in this encounter.   Imaging: Xr Pelvis 1-2 Views  Result Date: 03/31/2017 X-rays of the right hip reveal a well-seated prosthesis.   PMFS History: Patient Active Problem List   Diagnosis Date Noted  . Hypokalemia 02/11/2017  . Closed fracture of right hip (HCC)   . Chronic anxiety 08/23/2016  . Elevated troponin 01/13/2016  . Multiple fractures of ribs, right side, initial encounter for closed fracture 12/03/2015  . Rib fractures 12/03/2015  . Chronic respiratory failure (HCC) 12/03/2015  . COPD (chronic obstructive pulmonary disease) (HCC) 12/03/2015  . Senile purpura (HCC) 09/07/2015  . Benign essential tremor 09/07/2015  . Chronic diastolic congestive heart failure (HCC)  04/20/2015  . Chronic respiratory failure with hypoxia (HCC) 04/11/2014  . CKD (chronic kidney disease) stage 3, GFR 30-59 ml/min (HCC) 04/11/2014  . Prediabetes 12/03/2013  . Hypertrophic obstructive cardiomyopathy (HCC) 11/09/2013  . Respiratory failure with hypoxia (HCC) 05/07/2013  . COPD exacerbation (HCC) 02/15/2013  . Hyperlipidemia 11/09/2010  . CAD (coronary artery disease) 09/18/2009  . BRADYCARDIA 08/19/2009  . Dementia 07/19/2007  . Essential hypertension 07/19/2007  . CAROTID ARTERY STENOSIS 07/19/2007  . ASTHMA 07/19/2007  . HEADACHE, CHRONIC, HX OF 07/19/2007   Past Medical History:  Diagnosis Date  . Acute on chronic diastolic heart failure (HCC) 04/11/2014  . Anxiety    [name] 02/15/2017  . Arthritis    "joints ache all the time" (02/15/2017)  . Asthma   . Bradycardia   . CAD (coronary artery disease) 08/2009   s/p PCI of the LAD  . Carotid artery stenosis   . CHF (congestive heart failure) (HCC)    [name] 02/15/2017  . Chronic bronchitis (HCC)   . CKD (chronic kidney disease), stage III (HCC)    [name] 02/15/2017  . COPD (chronic obstructive pulmonary disease) (HCC)   . Daily headache    "since I had shingles on my face/head" (02/15/2017)  . Dementia    [name] 02/15/2017  . GERD (gastroesophageal reflux disease)   . Heart murmur   . History of blood transfusion 1970s; 02/15/2017   "S/P  miscarriage; low blood" (02/15/2017)  . Hyperlipidemia   . Hypertension   . Impaired fasting glucose   . NSTEMI (non-ST elevated myocardial infarction) (HCC)   . On home O2    "4 1/2L w/100 foot cord; 24/7" (02/15/2017)  . Osteoporosis 2009  . Pneumonia    "several times" (02/15/2017)  . Seasonal allergies   . Ulnar neuropathy     Family History  Problem Relation Age of Onset  . Addison's disease Mother   . Alcohol abuse Father     Past Surgical History:  Procedure Laterality Date  . ANTERIOR APPROACH HEMI HIP ARTHROPLASTY Right 02/12/2017   Procedure: ANTERIOR APPROACH HEMI  HIP ARTHROPLASTY;  Surgeon: Tarry Kos, MD;  Location: [facility] OR;  Service: Orthopedics;  Laterality: Right;  . CARDIAC CATHETERIZATION  11/2010   negative  . CARPAL TUNNEL RELEASE Right 2008   [name] 06/29/2010  . CATARACT EXTRACTION W/ INTRAOCULAR LENS  IMPLANT, BILATERAL Bilateral 12/2014  . CORONARY ANGIOPLASTY WITH STENT PLACEMENT    . DILATION AND CURETTAGE OF UTERUS    . FRACTURE SURGERY    . INCONTINENCE SURGERY  1991   "attached it to my pelvic bone"  . LEFT HEART CATHETERIZATION WITH CORONARY ANGIOGRAM N/A 11/05/2012   Procedure: LEFT HEART CATHETERIZATION WITH CORONARY ANGIOGRAM;  Surgeon: Micheline Chapman, MD;  Location: [facility] CATH LAB;  Service: Cardiovascular;  Laterality: N/A;  . VAGINAL HYSTERECTOMY  1984   partial   Social History   Occupational History  . Occupation: Retired    Associate Professor: RETIRED    Comment: Scientist, product/process development  Tobacco Use  . Smoking status: Former Smoker    Packs/day: 1.00    Years: 35.00    Pack years: 35.00    Last attempt to quit: 02/15/2008    Years since quitting: 9.1  . Smokeless tobacco: Never Used  Substance and Sexual Activity  . Alcohol use: No  . Drug use: No  . Sexual activity: Not Currently

## 2017-04-04 DIAGNOSIS — Z96641 Presence of right artificial hip joint: Secondary | ICD-10-CM | POA: Diagnosis not present

## 2017-04-04 DIAGNOSIS — I2510 Atherosclerotic heart disease of native coronary artery without angina pectoris: Secondary | ICD-10-CM | POA: Diagnosis not present

## 2017-04-04 DIAGNOSIS — J441 Chronic obstructive pulmonary disease with (acute) exacerbation: Secondary | ICD-10-CM | POA: Diagnosis not present

## 2017-04-04 DIAGNOSIS — H34832 Tributary (branch) retinal vein occlusion, left eye, with macular edema: Secondary | ICD-10-CM | POA: Diagnosis not present

## 2017-04-04 DIAGNOSIS — I130 Hypertensive heart and chronic kidney disease with heart failure and stage 1 through stage 4 chronic kidney disease, or unspecified chronic kidney disease: Secondary | ICD-10-CM | POA: Diagnosis not present

## 2017-04-04 DIAGNOSIS — M80051D Age-related osteoporosis with current pathological fracture, right femur, subsequent encounter for fracture with routine healing: Secondary | ICD-10-CM | POA: Diagnosis not present

## 2017-04-04 DIAGNOSIS — I5032 Chronic diastolic (congestive) heart failure: Secondary | ICD-10-CM | POA: Diagnosis not present

## 2017-04-05 ENCOUNTER — Other Ambulatory Visit: Payer: Self-pay | Admitting: *Deleted

## 2017-04-05 ENCOUNTER — Telehealth: Payer: Self-pay | Admitting: Cardiovascular Disease

## 2017-04-05 ENCOUNTER — Telehealth: Payer: Self-pay | Admitting: Family Medicine

## 2017-04-05 DIAGNOSIS — I130 Hypertensive heart and chronic kidney disease with heart failure and stage 1 through stage 4 chronic kidney disease, or unspecified chronic kidney disease: Secondary | ICD-10-CM | POA: Diagnosis not present

## 2017-04-05 DIAGNOSIS — Z96641 Presence of right artificial hip joint: Secondary | ICD-10-CM | POA: Diagnosis not present

## 2017-04-05 DIAGNOSIS — M80051D Age-related osteoporosis with current pathological fracture, right femur, subsequent encounter for fracture with routine healing: Secondary | ICD-10-CM | POA: Diagnosis not present

## 2017-04-05 DIAGNOSIS — I5032 Chronic diastolic (congestive) heart failure: Secondary | ICD-10-CM | POA: Diagnosis not present

## 2017-04-05 DIAGNOSIS — J441 Chronic obstructive pulmonary disease with (acute) exacerbation: Secondary | ICD-10-CM | POA: Diagnosis not present

## 2017-04-05 DIAGNOSIS — I2510 Atherosclerotic heart disease of native coronary artery without angina pectoris: Secondary | ICD-10-CM | POA: Diagnosis not present

## 2017-04-05 MED ORDER — CEFPROZIL 250 MG PO TABS: 250.0000 mg | ORAL_TABLET | Freq: Two times a day (BID) | ORAL | 0 refills | Status: DC

## 2017-04-05 NOTE — Telephone Encounter (Signed)
Cefzil 250 mg 1 twice daily for the next 7 days please notify us if any ongoing troubles

## 2017-04-05 NOTE — Telephone Encounter (Signed)
New Message     Pt c/o swelling: STAT is pt has developed SOB within 24 hours  1) How much weight have you gained and in what time span?  2/13 164.4 to 2/15 167.6  Today 165  2) If swelling, where is the swelling located?  Not seeing any swelling, 1cm increase in abdomin from last week   3) Are you currently taking a fluid pill? Yes   4) Are you currently SOB? no  5) Do you have a log of your daily weights (if so, list)? They log weight for home health records   6) Have you gained 3 pounds in a day or 5 pounds in a week? Last week but is back down some this week   Have you traveled recently? No   Patient is also having green mucus- would like you to call in antibiotic

## 2017-04-05 NOTE — Telephone Encounter (Signed)
No fever, no sob, cough and congestion - green just started. Vitals were good

## 2017-04-05 NOTE — Telephone Encounter (Signed)
HH nurse called to give an update. She states the patient is not having trouble breathing and exhibits no swelling. Her weight is down to baseline after her hospitalization. Instructed her to continue monitoring weight and to call if symptoms occur. Instructed her to contact PCP if she feels an antibiotic is necessary.  She was grateful for call and agrees with treatment plan.

## 2017-04-05 NOTE — Telephone Encounter (Signed)
Patient has a cough and is coughing up green phlegm.  Nurse Tammy is requesting Rx for antibiotic to be called in.   [facility]

## 2017-04-05 NOTE — Telephone Encounter (Signed)
Discussed with pt's granddaughter Misty Stanley and med sent to pharm.

## 2017-04-05 NOTE — Telephone Encounter (Signed)
Left message with Tammy to return call to get more info on pt.

## 2017-04-06 DIAGNOSIS — Z96641 Presence of right artificial hip joint: Secondary | ICD-10-CM | POA: Diagnosis not present

## 2017-04-06 DIAGNOSIS — M80051D Age-related osteoporosis with current pathological fracture, right femur, subsequent encounter for fracture with routine healing: Secondary | ICD-10-CM | POA: Diagnosis not present

## 2017-04-06 DIAGNOSIS — I130 Hypertensive heart and chronic kidney disease with heart failure and stage 1 through stage 4 chronic kidney disease, or unspecified chronic kidney disease: Secondary | ICD-10-CM | POA: Diagnosis not present

## 2017-04-06 DIAGNOSIS — J441 Chronic obstructive pulmonary disease with (acute) exacerbation: Secondary | ICD-10-CM | POA: Diagnosis not present

## 2017-04-06 DIAGNOSIS — I2510 Atherosclerotic heart disease of native coronary artery without angina pectoris: Secondary | ICD-10-CM | POA: Diagnosis not present

## 2017-04-06 DIAGNOSIS — I5032 Chronic diastolic (congestive) heart failure: Secondary | ICD-10-CM | POA: Diagnosis not present

## 2017-04-07 DIAGNOSIS — I2510 Atherosclerotic heart disease of native coronary artery without angina pectoris: Secondary | ICD-10-CM | POA: Diagnosis not present

## 2017-04-07 DIAGNOSIS — M80051D Age-related osteoporosis with current pathological fracture, right femur, subsequent encounter for fracture with routine healing: Secondary | ICD-10-CM | POA: Diagnosis not present

## 2017-04-07 DIAGNOSIS — Z96641 Presence of right artificial hip joint: Secondary | ICD-10-CM | POA: Diagnosis not present

## 2017-04-07 DIAGNOSIS — J441 Chronic obstructive pulmonary disease with (acute) exacerbation: Secondary | ICD-10-CM | POA: Diagnosis not present

## 2017-04-07 DIAGNOSIS — I5032 Chronic diastolic (congestive) heart failure: Secondary | ICD-10-CM | POA: Diagnosis not present

## 2017-04-07 DIAGNOSIS — I130 Hypertensive heart and chronic kidney disease with heart failure and stage 1 through stage 4 chronic kidney disease, or unspecified chronic kidney disease: Secondary | ICD-10-CM | POA: Diagnosis not present

## 2017-04-11 ENCOUNTER — Other Ambulatory Visit: Payer: Self-pay | Admitting: Family Medicine

## 2017-04-11 DIAGNOSIS — I2510 Atherosclerotic heart disease of native coronary artery without angina pectoris: Secondary | ICD-10-CM | POA: Diagnosis not present

## 2017-04-11 DIAGNOSIS — Z96641 Presence of right artificial hip joint: Secondary | ICD-10-CM | POA: Diagnosis not present

## 2017-04-11 DIAGNOSIS — I130 Hypertensive heart and chronic kidney disease with heart failure and stage 1 through stage 4 chronic kidney disease, or unspecified chronic kidney disease: Secondary | ICD-10-CM | POA: Diagnosis not present

## 2017-04-11 DIAGNOSIS — I5032 Chronic diastolic (congestive) heart failure: Secondary | ICD-10-CM | POA: Diagnosis not present

## 2017-04-11 DIAGNOSIS — J441 Chronic obstructive pulmonary disease with (acute) exacerbation: Secondary | ICD-10-CM | POA: Diagnosis not present

## 2017-04-11 DIAGNOSIS — M80051D Age-related osteoporosis with current pathological fracture, right femur, subsequent encounter for fracture with routine healing: Secondary | ICD-10-CM | POA: Diagnosis not present

## 2017-04-17 ENCOUNTER — Encounter: Payer: Self-pay | Admitting: Family Medicine

## 2017-04-17 ENCOUNTER — Ambulatory Visit (INDEPENDENT_AMBULATORY_CARE_PROVIDER_SITE_OTHER): Payer: Medicare Other | Admitting: Family Medicine

## 2017-04-17 VITALS — BP 130/68 | Temp 98.6°F | Ht 63.0 in | Wt 170.0 lb

## 2017-04-17 DIAGNOSIS — J441 Chronic obstructive pulmonary disease with (acute) exacerbation: Secondary | ICD-10-CM

## 2017-04-17 MED ORDER — CEFDINIR 300 MG PO CAPS: 300.0000 mg | ORAL_CAPSULE | Freq: Two times a day (BID) | ORAL | 0 refills | Status: DC

## 2017-04-17 MED ORDER — FUROSEMIDE 80 MG PO TABS: ORAL_TABLET | ORAL | 0 refills | Status: DC

## 2017-04-17 MED ORDER — PREDNISONE 20 MG PO TABS: ORAL_TABLET | ORAL | 0 refills | Status: DC

## 2017-04-17 NOTE — Progress Notes (Signed)
   Subjective:    Patient ID: [name], female    DOB: 04-18-1935, 82 y.o.   MRN: [id number]  HPI Patient is here today to discuss weight gain.She states on her scale she normally weighs 160's.Per Granddaughter [phone]) [phone] states wt went from 156 to 168 in about 2-3 weeks after discharged from hospital for hip replacement. She is currently taking 120 mg of lasix 80 am 40 pm,she takes metolazone 2.5 mg on Tues and Sat. Buspar 15 mg TID.She is having some sob ,but is on o2.  Having some intermittent shortness of breath along with coughing congestion bringing up yellow-green phlegm denies high fever chills sweats does have a history of COPD recently in the hospital Review of Systems  Constitutional: Negative for activity change, fatigue and fever.  HENT: Positive for congestion and rhinorrhea. Negative for ear pain.   Eyes: Negative for discharge.  Respiratory: Positive for cough, shortness of breath and wheezing.   Cardiovascular: Negative for chest pain.       Objective:   Physical Exam  Constitutional: She appears well-developed.  HENT:  Head: Normocephalic.  Right Ear: External ear normal.  Left Ear: External ear normal.  Nose: Nose normal.  Mouth/Throat: Oropharynx is clear and moist. No oropharyngeal exudate.  Eyes: Right eye exhibits no discharge. Left eye exhibits no discharge.  Neck: Neck supple. No tracheal deviation present.  Cardiovascular: Normal rate and normal heart sounds.  No murmur heard. Pulmonary/Chest: Effort normal. She has wheezes. She has no rales.  Faint wheeze no respiratory distress  Lymphadenopathy:    She has no cervical adenopathy.  Skin: Skin is warm and dry.  Nursing note and vitals reviewed.  4 pound weight gain over the past few days has history of CHF does not appear to be in CHF exacerbation currently  Patient is hospice-family has expressed desire not to do any lab work with the patient     Assessment & Plan:  COPD  exacerbation treatment measures discussed in detail Prednisone taper Antibiotics Continue inhalers Albuterol as needed Warnings discussed  Adjustment of diuretic to bring weight down

## 2017-04-18 ENCOUNTER — Telehealth: Payer: Self-pay | Admitting: Family Medicine

## 2017-04-18 NOTE — Telephone Encounter (Signed)
Patients granddaughter, Misty Stanley, said she got a note from Dr. Lorin Picket from [facility]'s visit yesterday.  She would like someone to call her back and clarify the instructions on Rosilyn's lasix.

## 2017-04-18 NOTE — Telephone Encounter (Signed)
Dr Lorin Picket spoke with Misty Stanley concerning her grandmother's weight

## 2017-04-18 NOTE — Telephone Encounter (Signed)
The following narrative should be able to help address the situation- nurses please call the granddaughter if any questions feel free to connect with me in real-time-I was told by the patient and her husband that her weight was up 4 or 5 pounds.  On physical exam I did not see any swelling in the legs but [name] stated that she was having shortness of breath.  It was felt that the patient was primarily having a COPD exacerbation but retaining fluid could also be a portion of her shortness of breath.  Therefore I recommended increasing her Lasix.  It was represented to me that she was on 80 mg in the morning and 40 in the afternoon.  It is quite possible that I misunderstood but that is what I recall being told.  Certainly I understand now the dosage that she is taking.  It is not intended to reduce the dosage.  Given that she is on 120 mg twice a day and her weight has gone up I would recommend the following-Lasix 80 mg, take 2 in the morning and take 2 in the afternoon until her weight is back down to her baseline then resume the previous dosing of 120 mg in the morning 120 mg in the afternoon.  Keep track of daily weights.  Feel free to send [name] an update in 1 week's time on how the weights are doing and how the patient is doing.

## 2017-04-18 NOTE — Telephone Encounter (Signed)
[name] states [name] was already taking Lasix 120 mg( 80mg  and 40mg ) in am and lasix 120mg (80mg  and 40 mg) at 3pm and is wondering why it was decreased to 80 mg twice a day

## 2017-04-18 NOTE — Telephone Encounter (Signed)
I discussed the case with [name] I believe some of the weight gain is because the patient is now eating a much more liberal diet compared to what she was on at the nursing home.  She will use the increased dose of furosemide for the next 3-4 days then reduce it back to the standard dosing she will follow-up if any ongoing troubles.  Family would like to avoid lab work if possible

## 2017-04-19 DIAGNOSIS — I5032 Chronic diastolic (congestive) heart failure: Secondary | ICD-10-CM | POA: Diagnosis not present

## 2017-04-19 DIAGNOSIS — J441 Chronic obstructive pulmonary disease with (acute) exacerbation: Secondary | ICD-10-CM | POA: Diagnosis not present

## 2017-04-19 DIAGNOSIS — I2510 Atherosclerotic heart disease of native coronary artery without angina pectoris: Secondary | ICD-10-CM | POA: Diagnosis not present

## 2017-04-19 DIAGNOSIS — I130 Hypertensive heart and chronic kidney disease with heart failure and stage 1 through stage 4 chronic kidney disease, or unspecified chronic kidney disease: Secondary | ICD-10-CM | POA: Diagnosis not present

## 2017-04-19 DIAGNOSIS — Z96641 Presence of right artificial hip joint: Secondary | ICD-10-CM | POA: Diagnosis not present

## 2017-04-19 DIAGNOSIS — M80051D Age-related osteoporosis with current pathological fracture, right femur, subsequent encounter for fracture with routine healing: Secondary | ICD-10-CM | POA: Diagnosis not present

## 2017-04-24 DIAGNOSIS — J441 Chronic obstructive pulmonary disease with (acute) exacerbation: Secondary | ICD-10-CM | POA: Diagnosis not present

## 2017-04-24 DIAGNOSIS — I2510 Atherosclerotic heart disease of native coronary artery without angina pectoris: Secondary | ICD-10-CM | POA: Diagnosis not present

## 2017-04-24 DIAGNOSIS — I130 Hypertensive heart and chronic kidney disease with heart failure and stage 1 through stage 4 chronic kidney disease, or unspecified chronic kidney disease: Secondary | ICD-10-CM | POA: Diagnosis not present

## 2017-04-24 DIAGNOSIS — I5032 Chronic diastolic (congestive) heart failure: Secondary | ICD-10-CM | POA: Diagnosis not present

## 2017-04-24 DIAGNOSIS — Z96641 Presence of right artificial hip joint: Secondary | ICD-10-CM | POA: Diagnosis not present

## 2017-04-24 DIAGNOSIS — M80051D Age-related osteoporosis with current pathological fracture, right femur, subsequent encounter for fracture with routine healing: Secondary | ICD-10-CM | POA: Diagnosis not present

## 2017-05-02 ENCOUNTER — Encounter: Payer: Self-pay | Admitting: Family Medicine

## 2017-05-02 ENCOUNTER — Telehealth: Payer: Self-pay | Admitting: *Deleted

## 2017-05-02 DIAGNOSIS — M80051D Age-related osteoporosis with current pathological fracture, right femur, subsequent encounter for fracture with routine healing: Secondary | ICD-10-CM | POA: Diagnosis not present

## 2017-05-02 DIAGNOSIS — J441 Chronic obstructive pulmonary disease with (acute) exacerbation: Secondary | ICD-10-CM | POA: Diagnosis not present

## 2017-05-02 DIAGNOSIS — I5032 Chronic diastolic (congestive) heart failure: Secondary | ICD-10-CM | POA: Diagnosis not present

## 2017-05-02 DIAGNOSIS — N390 Urinary tract infection, site not specified: Secondary | ICD-10-CM | POA: Diagnosis not present

## 2017-05-02 DIAGNOSIS — I130 Hypertensive heart and chronic kidney disease with heart failure and stage 1 through stage 4 chronic kidney disease, or unspecified chronic kidney disease: Secondary | ICD-10-CM | POA: Diagnosis not present

## 2017-05-02 DIAGNOSIS — I2510 Atherosclerotic heart disease of native coronary artery without angina pectoris: Secondary | ICD-10-CM | POA: Diagnosis not present

## 2017-05-02 DIAGNOSIS — Z96641 Presence of right artificial hip joint: Secondary | ICD-10-CM | POA: Diagnosis not present

## 2017-05-02 NOTE — Telephone Encounter (Signed)
Maralyn Sago -Nurse at [facility] called to report that the patient is having dysuria and her urine has a fowl odor. The nurse is requesting verbal order for urinalysis and culture. Consult with Dr Lorin Picket - may have verbal order-nurse verbalized understanding.

## 2017-05-03 ENCOUNTER — Telehealth: Payer: Self-pay | Admitting: Family Medicine

## 2017-05-03 NOTE — Telephone Encounter (Addendum)
Misty Stanley, nurse with [facility], called regarding results. She wanted to point out that her WBC was 2+, it showed cast in it, hyaline cast, and a few bacteria.  Everything else was normal.

## 2017-05-03 NOTE — Telephone Encounter (Signed)
Review lab results from [facility] in results folder in office.

## 2017-05-04 ENCOUNTER — Other Ambulatory Visit: Payer: Self-pay | Admitting: Family Medicine

## 2017-05-04 MED ORDER — CEPHALEXIN 500 MG PO CAPS: 500.0000 mg | ORAL_CAPSULE | Freq: Four times a day (QID) | ORAL | 0 refills | Status: DC

## 2017-05-04 NOTE — Telephone Encounter (Signed)
Preliminary urine results shows probable UTI-recommend Keflex 500 mg 4 times daily for 5 days-check with patient if she has difficult time swallowing go with liquid

## 2017-05-04 NOTE — Telephone Encounter (Signed)
Spoke with Maralyn Sago at [facility] who will discuss with family. Prescription sent electronically to pharmacy.

## 2017-05-06 ENCOUNTER — Emergency Department (HOSPITAL_COMMUNITY)
Admission: EM | Admit: 2017-05-06 | Discharge: 2017-05-06 | Disposition: A | Discharge status: Home / Self Care | Payer: Medicare Other | Attending: Emergency Medicine | Admitting: Emergency Medicine

## 2017-05-06 ENCOUNTER — Emergency Department (HOSPITAL_COMMUNITY): Payer: Medicare Other

## 2017-05-06 ENCOUNTER — Encounter (HOSPITAL_COMMUNITY): Payer: Self-pay | Admitting: Cardiology

## 2017-05-06 DIAGNOSIS — Z79899 Other long term (current) drug therapy: Secondary | ICD-10-CM | POA: Diagnosis not present

## 2017-05-06 DIAGNOSIS — M79672 Pain in left foot: Secondary | ICD-10-CM | POA: Insufficient documentation

## 2017-05-06 DIAGNOSIS — J449 Chronic obstructive pulmonary disease, unspecified: Secondary | ICD-10-CM | POA: Insufficient documentation

## 2017-05-06 DIAGNOSIS — I2510 Atherosclerotic heart disease of native coronary artery without angina pectoris: Secondary | ICD-10-CM | POA: Insufficient documentation

## 2017-05-06 DIAGNOSIS — N183 Chronic kidney disease, stage 3 (moderate): Secondary | ICD-10-CM | POA: Insufficient documentation

## 2017-05-06 DIAGNOSIS — I130 Hypertensive heart and chronic kidney disease with heart failure and stage 1 through stage 4 chronic kidney disease, or unspecified chronic kidney disease: Secondary | ICD-10-CM | POA: Insufficient documentation

## 2017-05-06 DIAGNOSIS — Z7982 Long term (current) use of aspirin: Secondary | ICD-10-CM | POA: Insufficient documentation

## 2017-05-06 DIAGNOSIS — I5032 Chronic diastolic (congestive) heart failure: Secondary | ICD-10-CM | POA: Insufficient documentation

## 2017-05-06 DIAGNOSIS — F0390 Unspecified dementia without behavioral disturbance: Secondary | ICD-10-CM | POA: Diagnosis not present

## 2017-05-06 DIAGNOSIS — M25572 Pain in left ankle and joints of left foot: Secondary | ICD-10-CM | POA: Diagnosis not present

## 2017-05-06 DIAGNOSIS — J45909 Unspecified asthma, uncomplicated: Secondary | ICD-10-CM | POA: Diagnosis not present

## 2017-05-06 DIAGNOSIS — R2242 Localized swelling, mass and lump, left lower limb: Secondary | ICD-10-CM | POA: Insufficient documentation

## 2017-05-06 DIAGNOSIS — M7989 Other specified soft tissue disorders: Secondary | ICD-10-CM | POA: Diagnosis not present

## 2017-05-06 LAB — BASIC METABOLIC PANEL
Anion gap: 14 (ref 5–15)
BUN: 61 mg/dL — ABNORMAL HIGH (ref 6–20)
CALCIUM: 9 mg/dL (ref 8.9–10.3)
CO2: 30 mmol/L (ref 22–32)
CREATININE: 2.4 mg/dL — AB (ref 0.44–1.00)
Chloride: 95 mmol/L — ABNORMAL LOW (ref 101–111)
GFR calc Af Amer: 21 mL/min — ABNORMAL LOW (ref >60)
GFR calc non Af Amer: 18 mL/min — ABNORMAL LOW (ref >60)
Glucose, Bld: 201 mg/dL — ABNORMAL HIGH (ref 65–99)
Potassium: 3.3 mmol/L — ABNORMAL LOW (ref 3.5–5.1)
SODIUM: 139 mmol/L (ref 135–145)

## 2017-05-06 LAB — URINALYSIS, ROUTINE W REFLEX MICROSCOPIC
BILIRUBIN URINE: NEGATIVE
Glucose, UA: NEGATIVE mg/dL
Hgb urine dipstick: NEGATIVE
Ketones, ur: NEGATIVE mg/dL
Nitrite: NEGATIVE
PH: 5 (ref 5.0–8.0)
Protein, ur: NEGATIVE mg/dL
SPECIFIC GRAVITY, URINE: 1.008 (ref 1.005–1.030)

## 2017-05-06 LAB — CBC
HCT: 30.6 % — ABNORMAL LOW (ref 36.0–46.0)
HEMOGLOBIN: 10 g/dL — AB (ref 12.0–15.0)
MCH: 29 pg (ref 26.0–34.0)
MCHC: 32.7 g/dL (ref 30.0–36.0)
MCV: 88.7 fL (ref 78.0–100.0)
PLATELETS: 142 10*3/uL — AB (ref 150–400)
RBC: 3.45 MIL/uL — ABNORMAL LOW (ref 3.87–5.11)
RDW: 14.7 % (ref 11.5–15.5)
WBC: 6.5 10*3/uL (ref 4.0–10.5)

## 2017-05-06 MED ORDER — DOXYCYCLINE HYCLATE 100 MG PO TABS: 100.0000 mg | ORAL_TABLET | Freq: Once | ORAL | Status: DC

## 2017-05-06 MED ORDER — COLCHICINE 0.6 MG PO TABS: ORAL_TABLET | ORAL | 0 refills | Status: AC

## 2017-05-06 MED ORDER — COLCHICINE 0.6 MG PO TABS
1.2000 mg | ORAL_TABLET | Freq: Once | ORAL | Status: AC
  Administered 2017-05-06: 1.2 mg via ORAL
  Filled 2017-05-06: qty 2

## 2017-05-06 MED ORDER — DOXYCYCLINE HYCLATE 100 MG PO TABS
100.0000 mg | ORAL_TABLET | Freq: Once | ORAL | Status: AC
  Administered 2017-05-06: 100 mg via ORAL
  Filled 2017-05-06: qty 1

## 2017-05-06 MED ORDER — DOXYCYCLINE HYCLATE 100 MG PO CAPS: 100.0000 mg | ORAL_CAPSULE | Freq: Two times a day (BID) | ORAL | 0 refills | Status: DC

## 2017-05-06 MED ORDER — POTASSIUM CHLORIDE CRYS ER 20 MEQ PO TBCR
20.0000 meq | EXTENDED_RELEASE_TABLET | Freq: Once | ORAL | Status: DC
  Filled 2017-05-06: qty 1

## 2017-05-06 MED ORDER — INDOMETHACIN 25 MG PO CAPS: 25.0000 mg | ORAL_CAPSULE | Freq: Three times a day (TID) | ORAL | 0 refills | Status: DC

## 2017-05-06 NOTE — Discharge Instructions (Signed)
Your testing today has been rather unremarkable Your potassium was low but we gave you a supplement to bring it up Your blood work did not show elevation in blood counts which is good, your x-ray did not show signs of infection of the bones or the soft tissues Please take the medication as below   Colchicine 0.6 my every 12 hours for 2 doses Indomethacine 25mg  by mouth every 8 hours for 5 days (no longer) or until the pain is gone Doxycycline 100mg  by mouth 2 times daily for 10 days  Please see your family doctor for a mandatory repeat evaluation on Monday, emergency department for worsening symptoms including increasing pain swelling or fevers

## 2017-05-06 NOTE — ED Triage Notes (Addendum)
Left foot and ankle swelling since yesterday.  Denies any injury.  Started antibiotic Thursday for a UTI

## 2017-05-06 NOTE — ED Provider Notes (Addendum)
[facility] EMERGENCY DEPARTMENT Provider Note   CSN: [id number] Arrival date & time: 05/06/17  1037     History   Chief Complaint Chief Complaint  Patient presents with  . Leg Swelling    HPI [name] is a 82 y.o. female.  HPI  82 y/o female - hx of some CHF and some COPD - on home O2, Chief Complaint of swelling of the left foot -   The patient is in significant amount of pain with her left foot, she had a recent surgery on her hip, where she underwent a arthroplasty of her hip after hip fracture on February 12, 2017.  The patient has done very well with this without any significant problems however she does note that over the last 12 hours she has developed some pain and swelling of her left foot mostly on the lateral foot surface, increased pain with range of motion of the foot and the ankle but no swelling of the leg, no fevers.  The symptoms are persistent, worse with palpation or trying to stand on it, better with rest.  The family is concerned for a blood clot  Past Medical History:  Diagnosis Date  . Acute on chronic diastolic heart failure (HCC) 04/11/2014  . Anxiety    [name] 02/15/2017  . Arthritis    "joints ache all the time" (02/15/2017)  . Asthma   . Bradycardia   . CAD (coronary artery disease) 08/2009   s/p PCI of the LAD  . Carotid artery stenosis   . CHF (congestive heart failure) (HCC)    [name] 02/15/2017  . Chronic bronchitis (HCC)   . CKD (chronic kidney disease), stage III (HCC)    [name] 02/15/2017  . COPD (chronic obstructive pulmonary disease) (HCC)   . Daily headache    "since I had shingles on my face/head" (02/15/2017)  . Dementia    [name] 02/15/2017  . GERD (gastroesophageal reflux disease)   . Heart murmur   . History of blood transfusion 1970s; 02/15/2017   "S/P miscarriage; low blood" (02/15/2017)  . Hyperlipidemia   . Hypertension   . Impaired fasting glucose   . NSTEMI (non-ST elevated myocardial infarction) (HCC)   . On home O2    "4 1/2L w/100 foot cord; 24/7" (02/15/2017)  . Osteoporosis 2009  . Pneumonia    "several times" (02/15/2017)  . Seasonal allergies   . Ulnar neuropathy     Patient Active Problem List   Diagnosis Date Noted  . Hypokalemia 02/11/2017  . Closed fracture of right hip (HCC)   . Chronic anxiety 08/23/2016  . Elevated troponin 01/13/2016  . Multiple fractures of ribs, right side, initial encounter for closed fracture 12/03/2015  . Rib fractures 12/03/2015  . Chronic respiratory failure (HCC) 12/03/2015  . COPD (chronic obstructive pulmonary disease) (HCC) 12/03/2015  . Senile purpura (HCC) 09/07/2015  . Benign essential tremor 09/07/2015  . Chronic diastolic congestive heart failure (HCC) 04/20/2015  . Chronic respiratory failure with hypoxia (HCC) 04/11/2014  . CKD (chronic kidney disease) stage 3, GFR 30-59 ml/min (HCC) 04/11/2014  . Prediabetes 12/03/2013  . Hypertrophic obstructive cardiomyopathy (HCC) 11/09/2013  . Respiratory failure with hypoxia (HCC) 05/07/2013  . COPD exacerbation (HCC) 02/15/2013  . Hyperlipidemia 11/09/2010  . CAD (coronary artery disease) 09/18/2009  . BRADYCARDIA 08/19/2009  . Dementia 07/19/2007  . Essential hypertension 07/19/2007  . CAROTID ARTERY STENOSIS 07/19/2007  . ASTHMA 07/19/2007  . HEADACHE, CHRONIC, HX OF 07/19/2007    Past Surgical History:  Procedure Laterality Date  . ANTERIOR APPROACH HEMI HIP ARTHROPLASTY Right 02/12/2017   Procedure: ANTERIOR APPROACH HEMI HIP ARTHROPLASTY;  Surgeon: Tarry Kos, MD;  Location: [facility] OR;  Service: Orthopedics;  Laterality: Right;  . CARDIAC CATHETERIZATION  11/2010   negative  . CARPAL TUNNEL RELEASE Right 2008   [name] 06/29/2010  . CATARACT EXTRACTION W/ INTRAOCULAR LENS  IMPLANT, BILATERAL Bilateral 12/2014  . CORONARY ANGIOPLASTY WITH STENT PLACEMENT    . DILATION AND CURETTAGE OF UTERUS    . FRACTURE SURGERY    . INCONTINENCE SURGERY  1991   "attached it to my pelvic bone"  . LEFT HEART  CATHETERIZATION WITH CORONARY ANGIOGRAM N/A 11/05/2012   Procedure: LEFT HEART CATHETERIZATION WITH CORONARY ANGIOGRAM;  Surgeon: Micheline Chapman, MD;  Location: [facility] CATH LAB;  Service: Cardiovascular;  Laterality: N/A;  . TOTAL HIP ARTHROPLASTY    . VAGINAL HYSTERECTOMY  1984   partial     OB History    Gravida      Para      Term      Preterm      AB      Living  3     SAB      TAB      Ectopic      Multiple      Live Births               Home Medications    Prior to Admission medications   Medication Sig Start Date End Date Taking? Authorizing Provider  acetaminophen (TYLENOL) 500 MG tablet Take 500 mg by mouth every 4 (four) hours.    [provider]  albuterol (PROVENTIL HFA;VENTOLIN HFA) 108 (90 Base) MCG/ACT inhaler Inhale 2 puffs into the lungs every 4 (four) hours as needed for wheezing or shortness of breath. 06/02/16   Babs Sciara, MD  albuterol (PROVENTIL) (2.5 MG/3ML) 0.083% nebulizer solution Take 3 mLs (2.5 mg total) by nebulization every 4 (four) hours as needed for wheezing or shortness of breath. 10/12/15   Babs Sciara, MD  amLODipine (NORVASC) 5 MG tablet TAKE 1/2 TABLET BY MOUTH DAILY 07/18/16   Merlyn Albert, MD  aspirin EC 325 MG tablet Take 325 mg by mouth daily. From 03/04/2017-03/31/2017    [provider]  aspirin EC 81 MG tablet Take 81 mg by mouth daily.    [provider]  budesonide-formoterol (SYMBICORT) 160-4.5 MCG/ACT inhaler Inhale 2 puffs into the lungs 2 (two) times daily. 11/23/16   Babs Sciara, MD  busPIRone (BUSPAR) 15 MG tablet TAKE 1 TABLET BY MOUTH THREE TIMES DAILY 03/17/17   Babs Sciara, MD  Calcium Carb-Cholecalciferol (CALCIUM 600 + D) 600-200 MG-UNIT TABS Take 1 tablet by mouth daily.    [provider]  cephALEXin (KEFLEX) 500 MG capsule Take 1 capsule (500 mg total) by mouth 4 (four) times daily. 05/04/17   Babs Sciara, MD  cetirizine (ZYRTEC) 10 MG tablet Take 10 mg  by mouth daily.     [provider]  colchicine 0.6 MG tablet Take 0.6mg  (one tablet) by mouth twice daily for 1 day 05/06/17   Eber Hong, MD  doxycycline (VIBRAMYCIN) 100 MG capsule Take 1 capsule (100 mg total) by mouth 2 (two) times daily. 05/06/17   Eber Hong, MD  erythromycin ophthalmic ointment Place 1 application into the right eye 4 (four) times daily.    [provider]  ferrous sulfate (KP FERROUS SULFATE) 325 (65 FE)  MG tablet Take 325 mg by mouth daily with breakfast.    [provider]  furosemide (LASIX) 40 MG tablet TAKE 1 TABLET BY MOUTH TWICE DAILY WITH 80MG  TABLET FOR 120MG  TWICE DAILY Patient taking differently: TAKE 1 TABLET BY MOUTH IN THE EVENING 04/12/17   Babs Sciara, MD  furosemide (LASIX) 80 MG tablet Take one tablet every am and one tablet at 3pm (hold 40mg  tabs at this time) 04/17/17   Babs Sciara, MD  HYDROcodone-acetaminophen (NORCO) 5-325 MG tablet Take 1-2 tablets by mouth every 6 (six) hours as needed. 02/22/17   Mahlon Gammon, MD  indomethacin (INDOCIN) 25 MG capsule Take 1 capsule (25 mg total) by mouth 3 (three) times daily with meals for 5 days. May take up to 50mg  three times a day if no improvement with 25mg . 05/06/17 05/11/17  Eber Hong, MD  ipratropium (ATROVENT) 0.02 % nebulizer solution USE 1 VIAL VIA NEBULIZER EVERY 6 HOURS AS NEEDED FOR WHEEZING OR SHORTNESS OF BREATH 10/25/16   Babs Sciara, MD  losartan (COZAAR) 25 MG tablet TAKE 1 TABLET BY MOUTH DAILY 01/16/17   Babs Sciara, MD  magic mouthwash w/lidocaine SOLN Take 5 mLs by mouth 4 (four) times daily as needed for mouth pain. 03/10/17   Campbell Riches, NP  meclizine (ANTIVERT) 12.5 MG tablet Take 12.5 mg by mouth every 6 (six) hours as needed for dizziness.    [provider]  metolazone (ZAROXOLYN) 2.5 MG tablet TAKE 1 TABLET BY MOUTH DAILY ON TUESDAYS AND THURSDAYS 09/16/16   Rosalio Macadamia, NP  metoprolol tartrate (LOPRESSOR) 25 MG tablet TAKE  1 TABLET BY MOUTH TWICE DAILY 08/11/16   Babs Sciara, MD  mupirocin ointment (BACTROBAN) 2 % Place 1 application into the nose 2 (two) times daily. Apply to incision to right hip    [provider]  nitroGLYCERIN (NITROSTAT) 0.4 MG SL tablet Place 1 tablet (0.4 mg total) under the tongue as needed for chest pain. 12/18/14   Babs Sciara, MD  omeprazole (PRILOSEC) 20 MG capsule Take 1 capsule (20 mg total) by mouth daily. 09/06/16   Babs Sciara, MD  ondansetron (ZOFRAN ODT) 8 MG disintegrating tablet Take 1 tablet (8 mg total) by mouth every 8 (eight) hours as needed for nausea or vomiting. 01/18/17   Babs Sciara, MD  polyethylene glycol (MIRALAX / GLYCOLAX) packet Take 17 g by mouth daily as needed for moderate constipation. 04/16/14   Elliot Cousin, MD  potassium chloride SA (K-DUR,KLOR-CON) 20 MEQ tablet TAKE 1 TABLET(20 MEQ) BY MOUTH TWICE DAILY 01/16/17   Babs Sciara, MD  predniSONE (DELTASONE) 20 MG tablet 3 tablets today then 2 tablets daily for 3 days then one tablet daily for 3 days 04/17/17   Babs Sciara, MD  sertraline (ZOLOFT) 100 MG tablet TAKE 1& 1/2 TABLETS BY MOUTH DAILY 11/16/16   Babs Sciara, MD  Simethicone (GAS RELIEF PO) Take 1 tablet by mouth daily as needed (gas relief).    [provider]    Family History Family History  Problem Relation Age of Onset  . Addison's disease Mother   . Alcohol abuse Father     Social History Social History   Tobacco Use  . Smoking status: Former Smoker    Packs/day: 1.00    Years: 35.00    Pack years: 35.00    Last attempt to quit: 02/15/2008    Years since quitting: 9.2  . Smokeless  tobacco: Never Used  Substance Use Topics  . Alcohol use: No  . Drug use: No     Allergies   Amoxil [amoxicillin]; Codeine; Dilaudid [hydromorphone hcl]; Erythromycin; and Sulfonamide derivatives   Review of Systems Review of Systems  All other systems reviewed and are negative.    Physical Exam Updated  Vital Signs BP 116/70 (BP Location: Right Arm)   Pulse 66   Temp 98.9 F (37.2 C) (Oral)   Resp 18   Ht 5\' 3"  (1.6 m)   Wt 75.4 kg (166 lb 3.2 oz)   SpO2 97%   BMI 29.44 kg/m   Physical Exam  Constitutional: She appears well-developed and well-nourished. No distress.  HENT:  Head: Normocephalic and atraumatic.  Mouth/Throat: Oropharynx is clear and moist. No oropharyngeal exudate.  Eyes: Pupils are equal, round, and reactive to light. Conjunctivae and EOM are normal. Right eye exhibits no discharge. Left eye exhibits no discharge. No scleral icterus.  Neck: Normal range of motion. Neck supple. No JVD present. No thyromegaly present.  Cardiovascular: Normal rate, regular rhythm, normal heart sounds and intact distal pulses. Exam reveals no gallop and no friction rub.  No murmur heard. Pulmonary/Chest: No respiratory distress. She has wheezes. She has no rales.  Mild tachypnea, mild expiratory wheezes, speaking in full sentences, no rales, no accessory muscle use, family member states appears to be at baseline  Abdominal: Soft. Bowel sounds are normal. She exhibits no distension and no mass. There is no tenderness.  Musculoskeletal: Normal range of motion. She exhibits tenderness. She exhibits no edema.  The bilateral lower extremities were examined, there does not appear to be any edema or asymmetry of the legs, the joints are all very supple and nontender except for the left foot and ankle where there is tenderness with flexion extension eversion and inversion of the ankle.  There is tenderness over the lateral aspect of the foot as well as the plantar aspect of the foot.  There is mild redness to the lateral dorsal aspect of the left foot.  There is no induration of the skin, there is no crepitance of the skin  Lymphadenopathy:    She has no cervical adenopathy.  Neurological: She is alert. Coordination normal.  Skin: Skin is warm and dry. Rash noted. There is erythema.  Psychiatric:  She has a normal mood and affect. Her behavior is normal.  Nursing note and vitals reviewed.    ED Treatments / Results  Labs (all labs ordered are listed, but only abnormal results are displayed) Labs Reviewed  CBC - Abnormal; Notable for the following components:      Result Value   RBC 3.45 (*)    Hemoglobin 10.0 (*)    HCT 30.6 (*)    Platelets 142 (*)    All other components within normal limits  BASIC METABOLIC PANEL - Abnormal; Notable for the following components:   Potassium 3.3 (*)    Chloride 95 (*)    Glucose, Bld 201 (*)    BUN 61 (*)    Creatinine, Ser 2.40 (*)    GFR calc non Af Amer 18 (*)    GFR calc Af Amer 21 (*)    All other components within normal limits  URINALYSIS, ROUTINE W REFLEX MICROSCOPIC - Abnormal; Notable for the following components:   Color, Urine STRAW (*)    Leukocytes, UA TRACE (*)    Bacteria, UA RARE (*)    Squamous Epithelial / LPF 0-5 (*)  All other components within normal limits  URINE CULTURE    EKG None  Radiology Dg Foot Complete Left  Result Date: 05/06/2017 CLINICAL DATA:  Left foot and ankle pain and swelling since yesterday. No known injury. EXAM: LEFT FOOT - COMPLETE 3+ VIEW COMPARISON:  None. FINDINGS: Moderately large inferior calcaneal spur. No fracture or dislocation seen. IMPRESSION: No acute abnormality.  Moderately large inferior calcaneal spur. Electronically Signed   By: Beckie Salts M.D.   On: 05/06/2017 13:59    Procedures Procedures (including critical care time)  Medications Ordered in ED Medications  potassium chloride SA (K-DUR,KLOR-CON) CR tablet 20 mEq (has no administration in time range)  doxycycline (VIBRA-TABS) tablet 100 mg (100 mg Oral Given 05/06/17 1233)  colchicine tablet 1.2 mg (1.2 mg Oral Given 05/06/17 1233)     Initial Impression / Assessment and Plan / ED Course  I have reviewed the triage vital signs and the nursing notes.  Pertinent labs & imaging results that were available  during my care of the patient were reviewed by me and considered in my medical decision making (see chart for details).  Clinical Course as of May 06 1416  Sat May 06, 2017  1330 White blood cell count normal at 6500, renal function seems close to baseline, BUN is close to baseline and potassium is slightly depressed at 3.3.  Potassium supplement given, initial dose of doxycycline and colchicine given, urinalysis rechecked and shows white blood cells of only 6-30 with rare bacteria making urinary infection extremely unlikely and that she has no urinary symptoms even more unlikely.   [BM]  1331 The patient will be discharged with her prescription for doxycycline as well as some medications for gout, she will need very close follow-up with her family doctor in both she and her daughter expressed understanding.  She is well-appearing, in no distress at all   [BM]  1402 CXR read as neg by radiology for fractures or infections in the soft tissues / bones.    [BM]    Clinical Course User Index [BM] Eber Hong, MD   Redness to the skin as seen above, I do not think this is related to a blood clot as the legs themselves look entirely nonswollen and nontender however she does have pretty significant tenderness with palpation over this area and range of motion of the joint raising suspicion for gout or possibly infection, with a cellulitis appearing on the skin.  I would treat her for both cellulitis as well as for gout given some of the shared findings on exam.  The patient is currently taking an antibiotic for her urinary tract infection, review of the medical record shows that she was started on Keflex 500 mg 4 times a day for 5 days, this was started 2 days ago.  I have reviewed the medical record and there does not appear to be any signs of urinary testing done and captured in our record, the patient has a home health nurse which comes in, phone records that were documented report that she had a  point-of-care urine test done that showed 2+ white blood cells, few bacteria, everything else was normal  That the patient has been taking Keflex and still having symptoms either suggestive of MRSA or more likely gout  Prior metabolic panel showed creatinine of 2, this seems to be baseline over time this was done on February 27, 2017  Discussed results with pharmacy, they recommend that colchicine would be better given in a dose  here at 1.2 mg followed by a single dose of 0.6 mg tomorrow and then indomethacin 25 mg 3 times a day as needed for the next several days.  Will add doxycycline to patient's antibiotics to cover MRSA as she is currently taking cephalexin  Discussed x-ray report with patient, radiologist agrees no signs of soft tissue or deep tissue infection and no fracture.  No I feel that DVT is a much less likely etiology the patient seems to be well-appearing, the family is extremely concerned that there may be a blood clot since she had surgery a couple months ago.  I am much less concerned however will order an ultrasound to be performed in the morning.  Family is agreeable  Final Clinical Impressions(s) / ED Diagnoses   Final diagnoses:  Foot pain, left    ED Discharge Orders        Ordered    colchicine 0.6 MG tablet     05/06/17 1415    indomethacin (INDOCIN) 25 MG capsule  3 times daily with meals     05/06/17 1415    doxycycline (VIBRAMYCIN) 100 MG capsule  2 times daily     05/06/17 1415       Eber Hong, MD 05/06/17 1418    Eber Hong, MD 05/06/17 1430

## 2017-05-07 ENCOUNTER — Ambulatory Visit (HOSPITAL_COMMUNITY)
Admission: RE | Admit: 2017-05-07 | Discharge: 2017-05-07 | Disposition: A | Discharge status: Home / Self Care | Payer: Medicare Other | Source: Ambulatory Visit | Attending: Emergency Medicine | Admitting: Emergency Medicine

## 2017-05-07 DIAGNOSIS — M7989 Other specified soft tissue disorders: Secondary | ICD-10-CM | POA: Insufficient documentation

## 2017-05-07 DIAGNOSIS — M79662 Pain in left lower leg: Secondary | ICD-10-CM | POA: Diagnosis not present

## 2017-05-08 ENCOUNTER — Other Ambulatory Visit: Payer: Self-pay | Admitting: Family Medicine

## 2017-05-08 ENCOUNTER — Other Ambulatory Visit: Payer: Self-pay | Admitting: Nurse Practitioner

## 2017-05-08 DIAGNOSIS — J449 Chronic obstructive pulmonary disease, unspecified: Secondary | ICD-10-CM

## 2017-05-08 DIAGNOSIS — I5033 Acute on chronic diastolic (congestive) heart failure: Secondary | ICD-10-CM

## 2017-05-08 LAB — URINE CULTURE: CULTURE: NO GROWTH

## 2017-05-09 ENCOUNTER — Telehealth: Payer: Self-pay | Admitting: Family Medicine

## 2017-05-09 ENCOUNTER — Telehealth: Payer: Self-pay

## 2017-05-09 DIAGNOSIS — J441 Chronic obstructive pulmonary disease with (acute) exacerbation: Secondary | ICD-10-CM | POA: Diagnosis not present

## 2017-05-09 DIAGNOSIS — Z96641 Presence of right artificial hip joint: Secondary | ICD-10-CM | POA: Diagnosis not present

## 2017-05-09 DIAGNOSIS — I130 Hypertensive heart and chronic kidney disease with heart failure and stage 1 through stage 4 chronic kidney disease, or unspecified chronic kidney disease: Secondary | ICD-10-CM | POA: Diagnosis not present

## 2017-05-09 DIAGNOSIS — M80051D Age-related osteoporosis with current pathological fracture, right femur, subsequent encounter for fracture with routine healing: Secondary | ICD-10-CM | POA: Diagnosis not present

## 2017-05-09 DIAGNOSIS — I2510 Atherosclerotic heart disease of native coronary artery without angina pectoris: Secondary | ICD-10-CM | POA: Diagnosis not present

## 2017-05-09 DIAGNOSIS — I5032 Chronic diastolic (congestive) heart failure: Secondary | ICD-10-CM | POA: Diagnosis not present

## 2017-05-09 DIAGNOSIS — H34832 Tributary (branch) retinal vein occlusion, left eye, with macular edema: Secondary | ICD-10-CM | POA: Diagnosis not present

## 2017-05-09 NOTE — Telephone Encounter (Signed)
May have verbal to do so

## 2017-05-09 NOTE — Telephone Encounter (Signed)
S/w Mervin Hack, per (DPR), will discuss with [name] tomorrow when pt's granddaughter comes to work.  Does not know about when pt will get blood work and stated what about the 3.3 potassium, should pt take potassium when pt takes metolazone on Tuesday and Thursdays.  Stacy also want's to discuss pt's foot, pt's foot is swollen, red, and hurts, but when pt elevate's leg the swelling goes away.  Kennyth Arnold would like to know if pt needs to be seen.  Will discuss all these issue's with [name] in office tomorrow. Will route to [place] to [place].

## 2017-05-09 NOTE — Telephone Encounter (Signed)
[name] with [facility] called wanting an an order to continue pt treatment weekly. They see her for Copd,Chf,and medication management.Please advise.

## 2017-05-09 NOTE — Telephone Encounter (Signed)
Nurses- currently the patient is on the schedule for 2 PM on Thursday.  This is a ER follow-up.  Please verify with family that they are safely able to get her here.  If she is not ambulatory I may be able to arrange for a home visit possibly Friday( if that is what they would like to do we may cancel Thursday's appointment and notify them on Thursday when I will come on Friday)

## 2017-05-10 DIAGNOSIS — K219 Gastro-esophageal reflux disease without esophagitis: Secondary | ICD-10-CM | POA: Diagnosis not present

## 2017-05-10 DIAGNOSIS — M1990 Unspecified osteoarthritis, unspecified site: Secondary | ICD-10-CM | POA: Diagnosis not present

## 2017-05-10 DIAGNOSIS — F329 Major depressive disorder, single episode, unspecified: Secondary | ICD-10-CM | POA: Diagnosis not present

## 2017-05-10 DIAGNOSIS — F419 Anxiety disorder, unspecified: Secondary | ICD-10-CM | POA: Diagnosis not present

## 2017-05-10 DIAGNOSIS — Z96641 Presence of right artificial hip joint: Secondary | ICD-10-CM | POA: Diagnosis not present

## 2017-05-10 DIAGNOSIS — G8929 Other chronic pain: Secondary | ICD-10-CM | POA: Diagnosis not present

## 2017-05-10 DIAGNOSIS — Z9981 Dependence on supplemental oxygen: Secondary | ICD-10-CM | POA: Diagnosis not present

## 2017-05-10 DIAGNOSIS — I130 Hypertensive heart and chronic kidney disease with heart failure and stage 1 through stage 4 chronic kidney disease, or unspecified chronic kidney disease: Secondary | ICD-10-CM | POA: Diagnosis not present

## 2017-05-10 DIAGNOSIS — N183 Chronic kidney disease, stage 3 (moderate): Secondary | ICD-10-CM | POA: Diagnosis not present

## 2017-05-10 DIAGNOSIS — J441 Chronic obstructive pulmonary disease with (acute) exacerbation: Secondary | ICD-10-CM | POA: Diagnosis not present

## 2017-05-10 DIAGNOSIS — Z87891 Personal history of nicotine dependence: Secondary | ICD-10-CM | POA: Diagnosis not present

## 2017-05-10 DIAGNOSIS — D631 Anemia in chronic kidney disease: Secondary | ICD-10-CM | POA: Diagnosis not present

## 2017-05-10 DIAGNOSIS — I5032 Chronic diastolic (congestive) heart failure: Secondary | ICD-10-CM | POA: Diagnosis not present

## 2017-05-10 DIAGNOSIS — Z7951 Long term (current) use of inhaled steroids: Secondary | ICD-10-CM | POA: Diagnosis not present

## 2017-05-10 DIAGNOSIS — Z9181 History of falling: Secondary | ICD-10-CM | POA: Diagnosis not present

## 2017-05-10 DIAGNOSIS — M80051D Age-related osteoporosis with current pathological fracture, right femur, subsequent encounter for fracture with routine healing: Secondary | ICD-10-CM | POA: Diagnosis not present

## 2017-05-10 DIAGNOSIS — F0390 Unspecified dementia without behavioral disturbance: Secondary | ICD-10-CM | POA: Diagnosis not present

## 2017-05-10 DIAGNOSIS — Z955 Presence of coronary angioplasty implant and graft: Secondary | ICD-10-CM | POA: Diagnosis not present

## 2017-05-10 DIAGNOSIS — I2510 Atherosclerotic heart disease of native coronary artery without angina pectoris: Secondary | ICD-10-CM | POA: Diagnosis not present

## 2017-05-10 DIAGNOSIS — Z7982 Long term (current) use of aspirin: Secondary | ICD-10-CM | POA: Diagnosis not present

## 2017-05-10 NOTE — Telephone Encounter (Signed)
Discussed with Misty Stanley.   Ms. Rupright seeing PCP tomorrow - will get BMET.   Still having issues with foot pain/redness/swelling.   Will decide on Monday about continuing Zaroxolyn  Rosalio Macadamia, RN, [facility] [address] [place], [place]  [address] [phone]

## 2017-05-10 NOTE — Telephone Encounter (Signed)
I called and left a message to r/c. 

## 2017-05-10 NOTE — Telephone Encounter (Signed)
Per Misty Stanley pt will keep Appt Thursday with her Dtr.

## 2017-05-10 NOTE — Telephone Encounter (Signed)
[name] with [facility] is aware.

## 2017-05-11 ENCOUNTER — Ambulatory Visit (INDEPENDENT_AMBULATORY_CARE_PROVIDER_SITE_OTHER): Payer: Medicare Other | Admitting: Family Medicine

## 2017-05-11 ENCOUNTER — Encounter: Payer: Self-pay | Admitting: Family Medicine

## 2017-05-11 VITALS — BP 128/70 | Ht 63.0 in | Wt 165.0 lb

## 2017-05-11 DIAGNOSIS — N289 Disorder of kidney and ureter, unspecified: Secondary | ICD-10-CM

## 2017-05-11 MED ORDER — ALBUTEROL SULFATE HFA 108 (90 BASE) MCG/ACT IN AERS: 2.0000 | INHALATION_SPRAY | RESPIRATORY_TRACT | 12 refills | Status: AC | PRN

## 2017-05-11 NOTE — Progress Notes (Signed)
   Subjective:    Patient ID: [name], female    DOB: [birth year], 82 y.o.   MRN: [id number]  HPIER follow up for left foot pain and swelling. Pt states foot is much better.   Needs refill on proventil inhaler.   Would like bmet done for [name] the NP at Dr. Earmon [place] office ( cardiologist) so she does not have to go to [employer]. Pt states kidney function is no better and may have to take her off of extra fluid pill.  Patient recently went to the ER with foot pain and swelling had x-rays lab work was told she might have gout I see no evidence of gout on today's exam cardiac status seems fine on examination Patient has chronic COPD and oxygen   Review of Systems  Constitutional: Negative for activity change, fatigue and fever.  HENT: Negative for congestion.   Respiratory: Negative for cough, chest tightness and shortness of breath.   Cardiovascular: Negative for chest pain and leg swelling.  Gastrointestinal: Negative for abdominal pain.  Skin: Negative for color change.  Neurological: Negative for headaches.  Psychiatric/Behavioral: Negative for behavioral problems.       Objective:   Physical Exam  Constitutional: She appears well-developed and well-nourished. No distress.  HENT:  Head: Normocephalic and atraumatic.  Eyes: Right eye exhibits no discharge. Left eye exhibits no discharge.  Neck: No tracheal deviation present.  Cardiovascular: Normal rate, regular rhythm and normal heart sounds.  No murmur heard. Pulmonary/Chest: Effort normal and breath sounds normal. No respiratory distress. She has no wheezes. She has no rales.  Musculoskeletal: She exhibits edema.  Lymphadenopathy:    She has no cervical adenopathy.  Neurological: She is alert. She exhibits normal muscle tone.  Skin: Skin is warm and dry. No erythema.  Psychiatric: Her behavior is normal.  Vitals reviewed.  Patient does have some slight pedal edema not bad       Assessment & Plan:  Renal  insufficiency recheck metabolic 7 share results with Dr. Excell Seltzer may need adjustment in diuretic possibly reduce Metolazone  I do not feel the patient has gout I recommend that she stop indomethacin.  Cardiac status seems stable  Follow-up 4 months

## 2017-05-12 ENCOUNTER — Ambulatory Visit (INDEPENDENT_AMBULATORY_CARE_PROVIDER_SITE_OTHER): Payer: Medicare Other

## 2017-05-12 ENCOUNTER — Encounter (INDEPENDENT_AMBULATORY_CARE_PROVIDER_SITE_OTHER): Payer: Self-pay | Admitting: Orthopaedic Surgery

## 2017-05-12 ENCOUNTER — Ambulatory Visit (INDEPENDENT_AMBULATORY_CARE_PROVIDER_SITE_OTHER): Payer: Medicare Other | Admitting: Orthopaedic Surgery

## 2017-05-12 DIAGNOSIS — S72001D Fracture of unspecified part of neck of right femur, subsequent encounter for closed fracture with routine healing: Secondary | ICD-10-CM

## 2017-05-12 LAB — BASIC METABOLIC PANEL
BUN/Creatinine Ratio: 31 — ABNORMAL HIGH (ref 12–28)
BUN: 85 mg/dL (ref 8–27)
CALCIUM: 9.2 mg/dL (ref 8.7–10.3)
CHLORIDE: 90 mmol/L — AB (ref 96–106)
CO2: 27 mmol/L (ref 20–29)
Creatinine, Ser: 2.74 mg/dL — ABNORMAL HIGH (ref 0.57–1.00)
GFR calc Af Amer: 18 mL/min/{1.73_m2} — ABNORMAL LOW (ref >59)
GFR, EST NON AFRICAN AMERICAN: 16 mL/min/{1.73_m2} — AB (ref >59)
GLUCOSE: 209 mg/dL — AB (ref 65–99)
Potassium: 3.9 mmol/L (ref 3.5–5.2)
Sodium: 140 mmol/L (ref 134–144)

## 2017-05-12 NOTE — Progress Notes (Signed)
Post-Op Visit Note   Patient: [name]           Date of Birth: 12/21/1935           MRN: [id number] Visit Date: 05/12/2017 PCP: Babs Sciara, MD   Assessment & Plan:  Chief Complaint:  Chief Complaint  Patient presents with  . Right Hip - Routine Post Op, Follow-up, Pain   Visit Diagnoses:  1. Closed fracture of right hip with routine healing, subsequent encounter     Plan: Patient is 3 months status post right hip hemiarthroplasty for femoral neck fracture.  Overall she is doing well.  She denies any pain.  She is doing home exercise.  She is ambulating with a walker.  She is living at home.  Leg lengths are equal.  X-rays are stable without complication.  At this point continue with home exercises for strengthening and mobilization.  Recheck in 3 months with 2 view x-rays of the right hip.  Follow-Up Instructions: Return in about 3 months (around 08/12/2017).   Orders:  Orders Placed This Encounter  Procedures  . XR HIP UNILAT W OR W/O PELVIS 2-3 VIEWS RIGHT   No orders of the defined types were placed in this encounter.   Imaging: Xr Hip Unilat W Or W/o Pelvis 2-3 Views Right  Result Date: 05/12/2017 Stable hemiarthroplasty without complication   PMFS History: Patient Active Problem List   Diagnosis Date Noted  . Hypokalemia 02/11/2017  . Closed fracture of right hip (HCC)   . Chronic anxiety 08/23/2016  . Elevated troponin 01/13/2016  . Multiple fractures of ribs, right side, initial encounter for closed fracture 12/03/2015  . Rib fractures 12/03/2015  . Chronic respiratory failure (HCC) 12/03/2015  . COPD (chronic obstructive pulmonary disease) (HCC) 12/03/2015  . Senile purpura (HCC) 09/07/2015  . Benign essential tremor 09/07/2015  . Chronic diastolic congestive heart failure (HCC) 04/20/2015  . Chronic respiratory failure with hypoxia (HCC) 04/11/2014  . CKD (chronic kidney disease) stage 3, GFR 30-59 ml/min (HCC) 04/11/2014  . Prediabetes  12/03/2013  . Hypertrophic obstructive cardiomyopathy (HCC) 11/09/2013  . Respiratory failure with hypoxia (HCC) 05/07/2013  . COPD exacerbation (HCC) 02/15/2013  . Hyperlipidemia 11/09/2010  . CAD (coronary artery disease) 09/18/2009  . BRADYCARDIA 08/19/2009  . Dementia 07/19/2007  . Essential hypertension 07/19/2007  . CAROTID ARTERY STENOSIS 07/19/2007  . ASTHMA 07/19/2007  . HEADACHE, CHRONIC, HX OF 07/19/2007   Past Medical History:  Diagnosis Date  . Acute on chronic diastolic heart failure (HCC) 04/11/2014  . Anxiety    [name] 02/15/2017  . Arthritis    "joints ache all the time" (02/15/2017)  . Asthma   . Bradycardia   . CAD (coronary artery disease) 08/2009   s/p PCI of the LAD  . Carotid artery stenosis   . CHF (congestive heart failure) (HCC)    [name] 02/15/2017  . Chronic bronchitis (HCC)   . CKD (chronic kidney disease), stage III (HCC)    [name] 02/15/2017  . COPD (chronic obstructive pulmonary disease) (HCC)   . Daily headache    "since I had shingles on my face/head" (02/15/2017)  . Dementia    [name] 02/15/2017  . GERD (gastroesophageal reflux disease)   . Heart murmur   . History of blood transfusion 1970s; 02/15/2017   "S/P miscarriage; low blood" (02/15/2017)  . Hyperlipidemia   . Hypertension   . Impaired fasting glucose   . NSTEMI (non-ST elevated myocardial infarction) (HCC)   . On home  O2    "4 1/2L w/100 foot cord; 24/7" (02/15/2017)  . Osteoporosis 2009  . Pneumonia    "several times" (02/15/2017)  . Seasonal allergies   . Ulnar neuropathy     Family History  Problem Relation Age of Onset  . Addison's disease Mother   . Alcohol abuse Father     Past Surgical History:  Procedure Laterality Date  . ANTERIOR APPROACH HEMI HIP ARTHROPLASTY Right 02/12/2017   Procedure: ANTERIOR APPROACH HEMI HIP ARTHROPLASTY;  Surgeon: Tarry Kos, MD;  Location: [facility] OR;  Service: Orthopedics;  Laterality: Right;  . CARDIAC CATHETERIZATION  11/2010   negative  .  CARPAL TUNNEL RELEASE Right 2008   [name] 06/29/2010  . CATARACT EXTRACTION W/ INTRAOCULAR LENS  IMPLANT, BILATERAL Bilateral 12/2014  . CORONARY ANGIOPLASTY WITH STENT PLACEMENT    . DILATION AND CURETTAGE OF UTERUS    . FRACTURE SURGERY    . INCONTINENCE SURGERY  1991   "attached it to my pelvic bone"  . LEFT HEART CATHETERIZATION WITH CORONARY ANGIOGRAM N/A 11/05/2012   Procedure: LEFT HEART CATHETERIZATION WITH CORONARY ANGIOGRAM;  Surgeon: Micheline Chapman, MD;  Location[facility] CATH LAB;  Service: Cardiovascular;  Laterality: N/A;  . TOTAL HIP ARTHROPLASTY    . VAGINAL HYSTERECTOMY  1984   partial   Social History   Occupational History  . Occupation: Retired    Associate Professor: RETIRED    Comment: Scientist, product/process development  Tobacco Use  . Smoking status: Former Smoker    Packs/day: 1.00    Years: 35.00    Pack years: 35.00    Last attempt to quit: 02/15/2008    Years since quitting: 9.2  . Smokeless tobacco: Never Used  Substance and Sexual Activity  . Alcohol use: No  . Drug use: No  . Sexual activity: Not Currently

## 2017-05-17 DIAGNOSIS — I5032 Chronic diastolic (congestive) heart failure: Secondary | ICD-10-CM | POA: Diagnosis not present

## 2017-05-17 DIAGNOSIS — M80051D Age-related osteoporosis with current pathological fracture, right femur, subsequent encounter for fracture with routine healing: Secondary | ICD-10-CM | POA: Diagnosis not present

## 2017-05-17 DIAGNOSIS — N183 Chronic kidney disease, stage 3 (moderate): Secondary | ICD-10-CM | POA: Diagnosis not present

## 2017-05-17 DIAGNOSIS — I2510 Atherosclerotic heart disease of native coronary artery without angina pectoris: Secondary | ICD-10-CM | POA: Diagnosis not present

## 2017-05-17 DIAGNOSIS — I130 Hypertensive heart and chronic kidney disease with heart failure and stage 1 through stage 4 chronic kidney disease, or unspecified chronic kidney disease: Secondary | ICD-10-CM | POA: Diagnosis not present

## 2017-05-17 DIAGNOSIS — J441 Chronic obstructive pulmonary disease with (acute) exacerbation: Secondary | ICD-10-CM | POA: Diagnosis not present

## 2017-05-22 ENCOUNTER — Other Ambulatory Visit (HOSPITAL_COMMUNITY)
Admission: AD | Admit: 2017-05-22 | Discharge: 2017-05-22 | Disposition: A | Discharge status: Home / Self Care | Payer: Medicare Other | Source: Skilled Nursing Facility | Attending: Nurse Practitioner | Admitting: Nurse Practitioner

## 2017-05-22 DIAGNOSIS — I5022 Chronic systolic (congestive) heart failure: Secondary | ICD-10-CM | POA: Insufficient documentation

## 2017-05-22 DIAGNOSIS — I2510 Atherosclerotic heart disease of native coronary artery without angina pectoris: Secondary | ICD-10-CM | POA: Diagnosis not present

## 2017-05-22 DIAGNOSIS — N183 Chronic kidney disease, stage 3 (moderate): Secondary | ICD-10-CM | POA: Diagnosis not present

## 2017-05-22 DIAGNOSIS — J441 Chronic obstructive pulmonary disease with (acute) exacerbation: Secondary | ICD-10-CM | POA: Diagnosis not present

## 2017-05-22 DIAGNOSIS — M80051D Age-related osteoporosis with current pathological fracture, right femur, subsequent encounter for fracture with routine healing: Secondary | ICD-10-CM | POA: Diagnosis not present

## 2017-05-22 DIAGNOSIS — I130 Hypertensive heart and chronic kidney disease with heart failure and stage 1 through stage 4 chronic kidney disease, or unspecified chronic kidney disease: Secondary | ICD-10-CM | POA: Diagnosis not present

## 2017-05-22 DIAGNOSIS — I5032 Chronic diastolic (congestive) heart failure: Secondary | ICD-10-CM | POA: Diagnosis not present

## 2017-05-22 LAB — BASIC METABOLIC PANEL
Anion gap: 15 (ref 5–15)
BUN: 39 mg/dL — ABNORMAL HIGH (ref 6–20)
CHLORIDE: 100 mmol/L — AB (ref 101–111)
CO2: 27 mmol/L (ref 22–32)
CREATININE: 1.7 mg/dL — AB (ref 0.44–1.00)
Calcium: 8.9 mg/dL (ref 8.9–10.3)
GFR calc non Af Amer: 27 mL/min — ABNORMAL LOW (ref >60)
GFR, EST AFRICAN AMERICAN: 32 mL/min — AB (ref >60)
Glucose, Bld: 71 mg/dL (ref 65–99)
POTASSIUM: 3.6 mmol/L (ref 3.5–5.1)
SODIUM: 142 mmol/L (ref 135–145)

## 2017-05-23 ENCOUNTER — Telehealth: Payer: Self-pay | Admitting: Nurse Practitioner

## 2017-05-23 ENCOUNTER — Telehealth: Payer: Self-pay | Admitting: *Deleted

## 2017-05-23 ENCOUNTER — Other Ambulatory Visit: Payer: Self-pay | Admitting: *Deleted

## 2017-05-23 DIAGNOSIS — J449 Chronic obstructive pulmonary disease, unspecified: Secondary | ICD-10-CM

## 2017-05-23 DIAGNOSIS — I5033 Acute on chronic diastolic (congestive) heart failure: Secondary | ICD-10-CM

## 2017-05-23 MED ORDER — METOLAZONE 2.5 MG PO TABS: 2.5000 mg | ORAL_TABLET | ORAL | 1 refills | Status: DC

## 2017-05-23 NOTE — Telephone Encounter (Signed)
Have been updated by Kennyth Arnold (here in [place]) regarding Ms. Dusel.   She has had a broken hip - was taken off of Hospice services.   Now back home - progressive swelling. Was taken off of Zaroxolyn due to worsening kidney function several weeks ago.   BMET obtained yesterday - BUN down to 39 with creatinine down to 1.7. Swelling is more persistent. Lots of shortness of breath.  Would restart Zaroxolyn 2.5 mg on Tuesday/Thursday as before.  Would consider restarting Hospice services as well.   Rosalio Macadamia, RN, ANP-C [facility] [address] [place], [place]  [address] [phone]

## 2017-05-23 NOTE — Telephone Encounter (Signed)
S/w pt's granddaughter is aware of Lori's recommendation's.  Will restart metalazone (2.5 mg ) tues, and thurs only. Sent in to requested pharmacy.  Hospice referral was placed in system today.

## 2017-05-30 DIAGNOSIS — I2510 Atherosclerotic heart disease of native coronary artery without angina pectoris: Secondary | ICD-10-CM | POA: Diagnosis not present

## 2017-05-30 DIAGNOSIS — I130 Hypertensive heart and chronic kidney disease with heart failure and stage 1 through stage 4 chronic kidney disease, or unspecified chronic kidney disease: Secondary | ICD-10-CM | POA: Diagnosis not present

## 2017-05-30 DIAGNOSIS — J441 Chronic obstructive pulmonary disease with (acute) exacerbation: Secondary | ICD-10-CM | POA: Diagnosis not present

## 2017-05-30 DIAGNOSIS — M80051D Age-related osteoporosis with current pathological fracture, right femur, subsequent encounter for fracture with routine healing: Secondary | ICD-10-CM | POA: Diagnosis not present

## 2017-05-30 DIAGNOSIS — I5032 Chronic diastolic (congestive) heart failure: Secondary | ICD-10-CM | POA: Diagnosis not present

## 2017-05-30 DIAGNOSIS — N183 Chronic kidney disease, stage 3 (moderate): Secondary | ICD-10-CM | POA: Diagnosis not present

## 2017-05-31 ENCOUNTER — Telehealth: Payer: Self-pay | Admitting: *Deleted

## 2017-05-31 DIAGNOSIS — J441 Chronic obstructive pulmonary disease with (acute) exacerbation: Secondary | ICD-10-CM | POA: Diagnosis not present

## 2017-05-31 NOTE — Telephone Encounter (Signed)
Faxing order

## 2017-06-08 DIAGNOSIS — I5032 Chronic diastolic (congestive) heart failure: Secondary | ICD-10-CM | POA: Diagnosis not present

## 2017-06-08 DIAGNOSIS — J441 Chronic obstructive pulmonary disease with (acute) exacerbation: Secondary | ICD-10-CM | POA: Diagnosis not present

## 2017-06-08 DIAGNOSIS — N183 Chronic kidney disease, stage 3 (moderate): Secondary | ICD-10-CM | POA: Diagnosis not present

## 2017-06-08 DIAGNOSIS — M80051D Age-related osteoporosis with current pathological fracture, right femur, subsequent encounter for fracture with routine healing: Secondary | ICD-10-CM | POA: Diagnosis not present

## 2017-06-08 DIAGNOSIS — I130 Hypertensive heart and chronic kidney disease with heart failure and stage 1 through stage 4 chronic kidney disease, or unspecified chronic kidney disease: Secondary | ICD-10-CM | POA: Diagnosis not present

## 2017-06-08 DIAGNOSIS — I2510 Atherosclerotic heart disease of native coronary artery without angina pectoris: Secondary | ICD-10-CM | POA: Diagnosis not present

## 2017-06-13 DIAGNOSIS — I5032 Chronic diastolic (congestive) heart failure: Secondary | ICD-10-CM | POA: Diagnosis not present

## 2017-06-13 DIAGNOSIS — H348322 Tributary (branch) retinal vein occlusion, left eye, stable: Secondary | ICD-10-CM | POA: Diagnosis not present

## 2017-06-13 DIAGNOSIS — I130 Hypertensive heart and chronic kidney disease with heart failure and stage 1 through stage 4 chronic kidney disease, or unspecified chronic kidney disease: Secondary | ICD-10-CM | POA: Diagnosis not present

## 2017-06-13 DIAGNOSIS — N183 Chronic kidney disease, stage 3 (moderate): Secondary | ICD-10-CM | POA: Diagnosis not present

## 2017-06-13 DIAGNOSIS — I2510 Atherosclerotic heart disease of native coronary artery without angina pectoris: Secondary | ICD-10-CM | POA: Diagnosis not present

## 2017-06-13 DIAGNOSIS — J441 Chronic obstructive pulmonary disease with (acute) exacerbation: Secondary | ICD-10-CM | POA: Diagnosis not present

## 2017-06-13 DIAGNOSIS — M80051D Age-related osteoporosis with current pathological fracture, right femur, subsequent encounter for fracture with routine healing: Secondary | ICD-10-CM | POA: Diagnosis not present

## 2017-06-16 ENCOUNTER — Other Ambulatory Visit: Payer: Self-pay | Admitting: Family Medicine

## 2017-06-16 NOTE — Telephone Encounter (Signed)
Six mo all ok 

## 2017-06-22 DIAGNOSIS — N183 Chronic kidney disease, stage 3 (moderate): Secondary | ICD-10-CM | POA: Diagnosis not present

## 2017-06-22 DIAGNOSIS — I5032 Chronic diastolic (congestive) heart failure: Secondary | ICD-10-CM | POA: Diagnosis not present

## 2017-06-22 DIAGNOSIS — M80051D Age-related osteoporosis with current pathological fracture, right femur, subsequent encounter for fracture with routine healing: Secondary | ICD-10-CM | POA: Diagnosis not present

## 2017-06-22 DIAGNOSIS — I2510 Atherosclerotic heart disease of native coronary artery without angina pectoris: Secondary | ICD-10-CM | POA: Diagnosis not present

## 2017-06-22 DIAGNOSIS — I130 Hypertensive heart and chronic kidney disease with heart failure and stage 1 through stage 4 chronic kidney disease, or unspecified chronic kidney disease: Secondary | ICD-10-CM | POA: Diagnosis not present

## 2017-06-22 DIAGNOSIS — J441 Chronic obstructive pulmonary disease with (acute) exacerbation: Secondary | ICD-10-CM | POA: Diagnosis not present

## 2017-06-27 DIAGNOSIS — M80051D Age-related osteoporosis with current pathological fracture, right femur, subsequent encounter for fracture with routine healing: Secondary | ICD-10-CM | POA: Diagnosis not present

## 2017-06-27 DIAGNOSIS — N183 Chronic kidney disease, stage 3 (moderate): Secondary | ICD-10-CM | POA: Diagnosis not present

## 2017-06-27 DIAGNOSIS — I5032 Chronic diastolic (congestive) heart failure: Secondary | ICD-10-CM | POA: Diagnosis not present

## 2017-06-27 DIAGNOSIS — J441 Chronic obstructive pulmonary disease with (acute) exacerbation: Secondary | ICD-10-CM | POA: Diagnosis not present

## 2017-06-27 DIAGNOSIS — I130 Hypertensive heart and chronic kidney disease with heart failure and stage 1 through stage 4 chronic kidney disease, or unspecified chronic kidney disease: Secondary | ICD-10-CM | POA: Diagnosis not present

## 2017-06-27 DIAGNOSIS — I2510 Atherosclerotic heart disease of native coronary artery without angina pectoris: Secondary | ICD-10-CM | POA: Diagnosis not present

## 2017-07-05 DIAGNOSIS — I5032 Chronic diastolic (congestive) heart failure: Secondary | ICD-10-CM | POA: Diagnosis not present

## 2017-07-05 DIAGNOSIS — M80051D Age-related osteoporosis with current pathological fracture, right femur, subsequent encounter for fracture with routine healing: Secondary | ICD-10-CM | POA: Diagnosis not present

## 2017-07-05 DIAGNOSIS — I2510 Atherosclerotic heart disease of native coronary artery without angina pectoris: Secondary | ICD-10-CM | POA: Diagnosis not present

## 2017-07-05 DIAGNOSIS — J441 Chronic obstructive pulmonary disease with (acute) exacerbation: Secondary | ICD-10-CM | POA: Diagnosis not present

## 2017-07-05 DIAGNOSIS — I130 Hypertensive heart and chronic kidney disease with heart failure and stage 1 through stage 4 chronic kidney disease, or unspecified chronic kidney disease: Secondary | ICD-10-CM | POA: Diagnosis not present

## 2017-07-05 DIAGNOSIS — N183 Chronic kidney disease, stage 3 (moderate): Secondary | ICD-10-CM | POA: Diagnosis not present

## 2017-07-17 ENCOUNTER — Other Ambulatory Visit: Payer: Self-pay | Admitting: *Deleted

## 2017-07-17 ENCOUNTER — Telehealth: Payer: Self-pay | Admitting: *Deleted

## 2017-07-17 DIAGNOSIS — I5032 Chronic diastolic (congestive) heart failure: Secondary | ICD-10-CM

## 2017-07-17 NOTE — Telephone Encounter (Signed)
S/w pt's granddaughter is aware referral was placed in system.

## 2017-07-17 NOTE — Telephone Encounter (Signed)
S/w Mervin Hack, pt's granddaughter, works at office.  HH d/c pt and hospice never came out to evaluate pt.  referral was placed in April but is closed.  Since [facility] has d/c pt, granddaughter was concerned due to know one going to house to check on pt and would like to try hospice again.  Do you want to place another referral ??  Will send to [name] to advise.

## 2017-07-17 NOTE — Telephone Encounter (Signed)
Ok to refer back to Hospice.

## 2017-07-18 DIAGNOSIS — H34832 Tributary (branch) retinal vein occlusion, left eye, with macular edema: Secondary | ICD-10-CM | POA: Diagnosis not present

## 2017-07-26 ENCOUNTER — Telehealth: Payer: Self-pay

## 2017-07-26 NOTE — Telephone Encounter (Signed)
Phone call placed to number attached to referral. Left VM for Stacy to offer to schedule visit with Palliative Care.

## 2017-07-28 ENCOUNTER — Telehealth: Payer: Self-pay

## 2017-07-28 NOTE — Telephone Encounter (Signed)
Visit scheduled for Thursday 08/03/17

## 2017-07-28 NOTE — Telephone Encounter (Signed)
Received return vm from [place], patient's grand daugher, to schedule visit. Phone call placed to [facility], provided NP availability for next week. Stacy to speak with her family and return call.

## 2017-07-31 ENCOUNTER — Encounter: Payer: Self-pay | Admitting: Family Medicine

## 2017-08-01 ENCOUNTER — Other Ambulatory Visit: Payer: Self-pay | Admitting: Family Medicine

## 2017-08-01 DIAGNOSIS — H34832 Tributary (branch) retinal vein occlusion, left eye, with macular edema: Secondary | ICD-10-CM | POA: Diagnosis not present

## 2017-08-01 MED ORDER — CEFPROZIL 250 MG PO TABS: 250.0000 mg | ORAL_TABLET | Freq: Two times a day (BID) | ORAL | 0 refills | Status: DC

## 2017-08-02 ENCOUNTER — Other Ambulatory Visit: Payer: Self-pay | Admitting: Family Medicine

## 2017-08-03 ENCOUNTER — Other Ambulatory Visit: Payer: Medicare Other | Admitting: Hospice and Palliative Medicine

## 2017-08-03 ENCOUNTER — Telehealth: Payer: Self-pay

## 2017-08-03 DIAGNOSIS — Z515 Encounter for palliative care: Secondary | ICD-10-CM

## 2017-08-03 NOTE — Telephone Encounter (Signed)
Kathe Becton hospice nurse called to [phone].She states [name] is doing well considering her health issues. She has end stage heart dz,end stage kidney dz, and copd. She was with hospice for about a year and she fell and broke her hip,she had surgery and went to rehab and was discharged from hospice.Her husband will soon have heart surgery and will not be able to take care of her as he is the primary care taker.Synetta Fail states the pt looks good for all of the health issues she has, she is eating and drinking good,walking and talking good and her O2 sitting is at 91 % on 3 liters of O2.They have a Sw who is going to go and evaluate,but they want to know whether or not you think she may need hospice again or home health with PT. She wanted to know if you would want to have an appt with her soon to evaluate her current needs.If so the Granddaughter # is [phone] thinks she could get her here. Please advise.

## 2017-08-03 NOTE — Telephone Encounter (Signed)
I do believe it is a good idea for the patient to schedule an office visit with [name] in the near future more than likely we could work her into the schedule either next week or the following week

## 2017-08-03 NOTE — Telephone Encounter (Signed)
Ok six mo worth 

## 2017-08-04 ENCOUNTER — Telehealth: Payer: Self-pay | Admitting: Licensed Clinical Social Worker

## 2017-08-04 NOTE — Telephone Encounter (Signed)
Granddaughter is aware and was transferred to the front to set up the appointment.

## 2017-08-04 NOTE — Telephone Encounter (Signed)
I called and left a message to r/c. 

## 2017-08-04 NOTE — Telephone Encounter (Signed)
Patient daughter called and states she will have Misty Stanley the Granddaughter to call us to set up.

## 2017-08-04 NOTE — Telephone Encounter (Signed)
Palliative Care SW phoned patient's granddaughter, Kennyth Arnold, per the request of NP, Kathe Becton.  She would like to further discuss patient's options for care while her husband is in the hospital.  Plan is for SW to call Stacy on Monday, 08/07/17.

## 2017-08-05 NOTE — Progress Notes (Signed)
PALLIATIVE CARE CONSULT VISIT   PATIENT NAME: [name] DOB: November 27, 1935 MRN: [id number]  PRIMARY CARE PROVIDER: Babs Sciara, MD  REFERRING PROVIDER: Babs Sciara, MD [address] [place], [place] [address]  RESPONSIBLE PARTY:   Patient's husband   RECOMMENDATIONS and PLAN:  1.Weakness: secondary to the following: currently being treated for UTI; has CHF, COPD on 3 liters of oxygen, memory loss, CKD IV. She was a hospice patient at one time for HF but fell and broke her hip. She discharged hospice for hip surgery and rehab. Recommendations: HHC PT/OT/ST nursing and nursing assistant; 24 hour caregivers. If not eligible for [facility], she needs Hospice services. Her husband is primary caregiver and is having heart surgery next week and will require rehab himself. Immediate needs: SW for options for her care./Schedule an appointment with her PCP to help determine course of action. She is currently maximizing her diuretics at 120 mg of furosemide twice daily with aldactone twice weekly. Her renal function is very poor with CKD IV. Her oxygen sats are only 91% with 3 liters of oxygen at rest. Her oxygen sats drop significantly with walking. 2. ACP: DNR status. Completed MOST form indicating "comfort measures only." Daughters agree that Hospice is a good option for her.   I spent 60 minutes providing this consultation,  from 10 to 11am. More than 50% of the time in this consultation was spent driving to the wrong address, interviewing patient, patient's daughter and husband, assessing patient and coordinating communication.   HISTORY OF PRESENT ILLNESS:  [name] is a 82 y.o. female with multiple medical problems including but not limited to end stage COPD, HF and renal failure. Palliative Care was asked by patient's cardiologist to help address symptom management and goals of care.   CODE STATUS: DNR  PPS: 50% HOSPICE ELIGIBILITY/DIAGNOSIS: yes, Heart failure/end  stage COPD/renal failure  PAST MEDICAL HISTORY:  Past Medical History:  Diagnosis Date  . Acute on chronic diastolic heart failure (HCC) 04/11/2014  . Anxiety    [name] 02/15/2017  . Arthritis    "joints ache all the time" (02/15/2017)  . Asthma   . Bradycardia   . CAD (coronary artery disease) 08/2009   s/p PCI of the LAD  . Carotid artery stenosis   . CHF (congestive heart failure) (HCC)    [name] 02/15/2017  . Chronic bronchitis (HCC)   . CKD (chronic kidney disease), stage III (HCC)    [name] 02/15/2017  . COPD (chronic obstructive pulmonary disease) (HCC)   . Daily headache    "since I had shingles on my face/head" (02/15/2017)  . Dementia    [name] 02/15/2017  . GERD (gastroesophageal reflux disease)   . Heart murmur   . History of blood transfusion 1970s; 02/15/2017   "S/P miscarriage; low blood" (02/15/2017)  . Hyperlipidemia   . Hypertension   . Impaired fasting glucose   . NSTEMI (non-ST elevated myocardial infarction) (HCC)   . On home O2    "4 1/2L w/100 foot cord; 24/7" (02/15/2017)  . Osteoporosis 2009  . Pneumonia    "several times" (02/15/2017)  . Seasonal allergies   . Ulnar neuropathy     SOCIAL HX:  Social History   Tobacco Use  . Smoking status: Former Smoker    Packs/day: 1.00    Years: 35.00    Pack years: 35.00    Last attempt to quit: 02/15/2008    Years since quitting: 9.4  . Smokeless tobacco:  Never Used  Substance Use Topics  . Alcohol use: No    ALLERGIES:  Allergies  Allergen Reactions  . Amoxil [Amoxicillin]     Thrush each times she takes it  . Codeine Nausea And Vomiting  . Dilaudid [Hydromorphone Hcl] Other (See Comments)    Extremely sensitive  . Erythromycin Other (See Comments)    Intense stomach pain  . Sulfonamide Derivatives     unknown     PERTINENT MEDICATIONS:  Outpatient Encounter Medications as of 08/03/2017  Medication Sig  . acetaminophen (TYLENOL) 500 MG tablet Take 500 mg by mouth every 4 (four) hours.  Marland Kitchen albuterol  (PROVENTIL HFA;VENTOLIN HFA) 108 (90 Base) MCG/ACT inhaler Inhale 2 puffs into the lungs every 4 (four) hours as needed for wheezing or shortness of breath.  Marland Kitchen albuterol (PROVENTIL) (2.5 MG/3ML) 0.083% nebulizer solution Take 3 mLs (2.5 mg total) by nebulization every 4 (four) hours as needed for wheezing or shortness of breath.  Marland Kitchen amLODipine (NORVASC) 5 MG tablet TAKE 1/2 TABLET BY MOUTH DAILY  . aspirin EC 325 MG tablet Take 325 mg by mouth daily. From 03/04/2017-03/31/2017  . aspirin EC 81 MG tablet Take 81 mg by mouth daily.  . budesonide-formoterol (SYMBICORT) 160-4.5 MCG/ACT inhaler Inhale 2 puffs into the lungs 2 (two) times daily.  . busPIRone (BUSPAR) 15 MG tablet TAKE 1 TABLET BY MOUTH THREE TIMES DAILY  . Calcium Carb-Cholecalciferol (CALCIUM 600 + D) 600-200 MG-UNIT TABS Take 1 tablet by mouth daily.  . cefPROZIL (CEFZIL) 250 MG tablet Take 1 tablet (250 mg total) by mouth 2 (two) times daily.  . cetirizine (ZYRTEC) 10 MG tablet Take 10 mg by mouth daily.   . clonazePAM (KLONOPIN) 0.5 MG tablet TAKE 1/2 TABLET BY MOUTH IN THE MORNING AND 1 TABLET AT BEDTIME  . colchicine 0.6 MG tablet Take 0.6mg  (one tablet) by mouth twice daily for 1 day  . doxycycline (VIBRAMYCIN) 100 MG capsule Take 1 capsule (100 mg total) by mouth 2 (two) times daily.  Marland Kitchen erythromycin ophthalmic ointment Place 1 application into the right eye 4 (four) times daily.  . ferrous sulfate (KP FERROUS SULFATE) 325 (65 FE) MG tablet Take 325 mg by mouth daily with breakfast.  . furosemide (LASIX) 40 MG tablet TAKE 1 TABLET BY MOUTH TWICE DAILY WITH 80MG  TABLET FOR 120MG  TWICE DAILY  . furosemide (LASIX) 80 MG tablet Take one tablet every am and one tablet at 3pm (hold 40mg  tabs at this time)  . furosemide (LASIX) 80 MG tablet TAKE 1 TABLET BY MOUTH TWICE DAILY WITH 40MG  FOR TOTAL OF 120 MG EVERY DAY  . HYDROcodone-acetaminophen (NORCO) 5-325 MG tablet Take 1-2 tablets by mouth every 6 (six) hours as needed.  Marland Kitchen ipratropium  (ATROVENT) 0.02 % nebulizer solution USE 1 VIAL VIA NEBULIZER EVERY 6 HOURS AS NEEDED FOR WHEEZING OR SHORTNESS OF BREATH  . losartan (COZAAR) 25 MG tablet TAKE 1 TABLET BY MOUTH DAILY  . meclizine (ANTIVERT) 12.5 MG tablet Take 12.5 mg by mouth every 6 (six) hours as needed for dizziness.  . metolazone (ZAROXOLYN) 2.5 MG tablet Take 1 tablet (2.5 mg total) by mouth 2 (two) times a week.  . metoprolol tartrate (LOPRESSOR) 25 MG tablet TAKE 1 TABLET BY MOUTH TWICE DAILY  . nitroGLYCERIN (NITROSTAT) 0.4 MG SL tablet Place 1 tablet (0.4 mg total) under the tongue as needed for chest pain.  Marland Kitchen omeprazole (PRILOSEC) 20 MG capsule Take 1 capsule (20 mg total) by mouth daily.  . ondansetron [facility]  ODT) 8 MG disintegrating tablet Take 1 tablet (8 mg total) by mouth every 8 (eight) hours as needed for nausea or vomiting.  . polyethylene glycol (MIRALAX / GLYCOLAX) packet Take 17 g by mouth daily as needed for moderate constipation.  . potassium chloride SA (K-DUR,KLOR-CON) 20 MEQ tablet TAKE 1 TABLET(20 MEQ) BY MOUTH TWICE DAILY  . predniSONE (DELTASONE) 20 MG tablet 3 tablets today then 2 tablets daily for 3 days then one tablet daily for 3 days  . sertraline (ZOLOFT) 100 MG tablet TAKE 1 AND 1/2 TABLETS BY MOUTH DAILY  . Simethicone (GAS RELIEF PO) Take 1 tablet by mouth daily as needed (gas relief).   No facility-administered encounter medications on file as of 08/03/2017.     PHYSICAL EXAM:  BP 130/80, p 86, oxygen sats 91% on 3 liters  General: elderly female walking unsteadily without oxygen; reminded to place her oxygen. Sats at 91% on 3 liters; short term memory loss; rapid breathing with exertion. Cardiovascular: irreg rhythm; reg rate; +murmur Pulmonary: poor oxygen exchange; diminished throughout Abdomen: soft, active BS, NTTP Extremities: ankle edema bilat; unsteady gait Skin: thin, easily torn;bruised; fragile Neurological: alert; forgetful; +generalized weakness +high fall risk  Truett Perna, NP

## 2017-08-07 ENCOUNTER — Encounter: Payer: Self-pay | Admitting: Family Medicine

## 2017-08-07 ENCOUNTER — Ambulatory Visit (INDEPENDENT_AMBULATORY_CARE_PROVIDER_SITE_OTHER): Payer: Medicare Other | Admitting: Family Medicine

## 2017-08-07 VITALS — BP 138/82 | Ht 63.0 in | Wt 168.0 lb

## 2017-08-07 DIAGNOSIS — J449 Chronic obstructive pulmonary disease, unspecified: Secondary | ICD-10-CM

## 2017-08-07 DIAGNOSIS — N289 Disorder of kidney and ureter, unspecified: Secondary | ICD-10-CM | POA: Diagnosis not present

## 2017-08-07 NOTE — Addendum Note (Signed)
Addended by: Margaretha Sheffield on: 08/07/2017 01:45 PM   Modules accepted: Orders

## 2017-08-07 NOTE — Progress Notes (Signed)
Hospice referral ordered in [facility]

## 2017-08-07 NOTE — Progress Notes (Signed)
   Subjective:    Patient ID: [name], female    DOB: 1935-05-06, 82 y.o.   MRN: [id number]  HPI  Patient is here today to discuss whether or not she needs to have hospice brought back in verses Pt. Her husband is the primary caregiver and he will be having surgery and will not be able to take care of her. The patient does not want any interventions Does not want to go to the ER does not want lab work Finds himself short of breath with most activity Oxygen desaturates frequently despite oxygen supplementation Has chronic issues of swelling and cor pulmonale symptoms Family is interested in starting back on hospice They would feel most comfortable having a nurse come periodically to check on the patient Review of Systems Currently relates shortness of breath with all activities denies chest pressure tightness pain denies wheezing does have some difficulty breathing    Objective:   Physical Exam HEENT benign lungs moves air poorly heart regular extremities no edema skin warm dry       Assessment & Plan:  Frailty End-stage COPD Cor pulmonale Very difficult to predict how long this patient will live it is reasonable to state that it could be within 6 months for her to be passing Family and the patient desires no major intervention They would prefer for a hospice consultation which I think is reasonable   Also should be noted that the husband will be going into the hospital soon for heart surgery will need some help at home if they are unable to get help at home patient may need to be placed into short-term assisted living

## 2017-08-08 ENCOUNTER — Encounter (INDEPENDENT_AMBULATORY_CARE_PROVIDER_SITE_OTHER): Payer: Self-pay

## 2017-08-08 ENCOUNTER — Telehealth: Payer: Self-pay | Admitting: Family Medicine

## 2017-08-08 DIAGNOSIS — E785 Hyperlipidemia, unspecified: Secondary | ICD-10-CM | POA: Diagnosis not present

## 2017-08-08 DIAGNOSIS — G250 Essential tremor: Secondary | ICD-10-CM | POA: Diagnosis not present

## 2017-08-08 DIAGNOSIS — F0390 Unspecified dementia without behavioral disturbance: Secondary | ICD-10-CM | POA: Diagnosis not present

## 2017-08-08 DIAGNOSIS — R001 Bradycardia, unspecified: Secondary | ICD-10-CM | POA: Diagnosis not present

## 2017-08-08 DIAGNOSIS — I509 Heart failure, unspecified: Secondary | ICD-10-CM | POA: Diagnosis not present

## 2017-08-08 DIAGNOSIS — N189 Chronic kidney disease, unspecified: Secondary | ICD-10-CM | POA: Diagnosis not present

## 2017-08-08 DIAGNOSIS — I2781 Cor pulmonale (chronic): Secondary | ICD-10-CM | POA: Diagnosis not present

## 2017-08-08 DIAGNOSIS — I2510 Atherosclerotic heart disease of native coronary artery without angina pectoris: Secondary | ICD-10-CM | POA: Diagnosis not present

## 2017-08-08 NOTE — Telephone Encounter (Signed)
Verbal order given to Cassandra at [facility].

## 2017-08-08 NOTE — Telephone Encounter (Signed)
Cassandra with [facility] calling to see if Dr. Lorin Picket will sign off on a 6 month or less prognosis and if he would be primary care while she is with Hospice. CB# [phone] ext [phone]

## 2017-08-08 NOTE — Telephone Encounter (Signed)
We will sign off on this thank you

## 2017-08-09 DIAGNOSIS — N189 Chronic kidney disease, unspecified: Secondary | ICD-10-CM | POA: Diagnosis not present

## 2017-08-09 DIAGNOSIS — R001 Bradycardia, unspecified: Secondary | ICD-10-CM | POA: Diagnosis not present

## 2017-08-09 DIAGNOSIS — I2510 Atherosclerotic heart disease of native coronary artery without angina pectoris: Secondary | ICD-10-CM | POA: Diagnosis not present

## 2017-08-09 DIAGNOSIS — G250 Essential tremor: Secondary | ICD-10-CM | POA: Diagnosis not present

## 2017-08-09 DIAGNOSIS — I509 Heart failure, unspecified: Secondary | ICD-10-CM | POA: Diagnosis not present

## 2017-08-09 DIAGNOSIS — F0390 Unspecified dementia without behavioral disturbance: Secondary | ICD-10-CM | POA: Diagnosis not present

## 2017-08-10 ENCOUNTER — Telehealth: Payer: Self-pay | Admitting: Licensed Clinical Social Worker

## 2017-08-10 NOTE — Telephone Encounter (Signed)
Palliative Care SW left a vm for patient's daughter, Kennyth Arnold, to assess needs.

## 2017-08-11 DIAGNOSIS — I509 Heart failure, unspecified: Secondary | ICD-10-CM | POA: Diagnosis not present

## 2017-08-11 DIAGNOSIS — I2510 Atherosclerotic heart disease of native coronary artery without angina pectoris: Secondary | ICD-10-CM | POA: Diagnosis not present

## 2017-08-11 DIAGNOSIS — G250 Essential tremor: Secondary | ICD-10-CM | POA: Diagnosis not present

## 2017-08-11 DIAGNOSIS — N189 Chronic kidney disease, unspecified: Secondary | ICD-10-CM | POA: Diagnosis not present

## 2017-08-11 DIAGNOSIS — R001 Bradycardia, unspecified: Secondary | ICD-10-CM | POA: Diagnosis not present

## 2017-08-11 DIAGNOSIS — F0390 Unspecified dementia without behavioral disturbance: Secondary | ICD-10-CM | POA: Diagnosis not present

## 2017-08-14 ENCOUNTER — Ambulatory Visit (INDEPENDENT_AMBULATORY_CARE_PROVIDER_SITE_OTHER): Payer: Medicare Other | Admitting: Orthopaedic Surgery

## 2017-08-14 DIAGNOSIS — E785 Hyperlipidemia, unspecified: Secondary | ICD-10-CM | POA: Diagnosis not present

## 2017-08-14 DIAGNOSIS — R001 Bradycardia, unspecified: Secondary | ICD-10-CM | POA: Diagnosis not present

## 2017-08-14 DIAGNOSIS — I2510 Atherosclerotic heart disease of native coronary artery without angina pectoris: Secondary | ICD-10-CM | POA: Diagnosis not present

## 2017-08-14 DIAGNOSIS — F0390 Unspecified dementia without behavioral disturbance: Secondary | ICD-10-CM | POA: Diagnosis not present

## 2017-08-14 DIAGNOSIS — N189 Chronic kidney disease, unspecified: Secondary | ICD-10-CM | POA: Diagnosis not present

## 2017-08-14 DIAGNOSIS — G250 Essential tremor: Secondary | ICD-10-CM | POA: Diagnosis not present

## 2017-08-14 DIAGNOSIS — I2781 Cor pulmonale (chronic): Secondary | ICD-10-CM | POA: Diagnosis not present

## 2017-08-14 DIAGNOSIS — I509 Heart failure, unspecified: Secondary | ICD-10-CM | POA: Diagnosis not present

## 2017-08-15 ENCOUNTER — Ambulatory Visit (INDEPENDENT_AMBULATORY_CARE_PROVIDER_SITE_OTHER): Payer: Self-pay

## 2017-08-15 ENCOUNTER — Encounter (INDEPENDENT_AMBULATORY_CARE_PROVIDER_SITE_OTHER): Payer: Self-pay | Admitting: Orthopaedic Surgery

## 2017-08-15 ENCOUNTER — Ambulatory Visit (INDEPENDENT_AMBULATORY_CARE_PROVIDER_SITE_OTHER): Payer: Medicare Other | Admitting: Orthopaedic Surgery

## 2017-08-15 DIAGNOSIS — G250 Essential tremor: Secondary | ICD-10-CM | POA: Diagnosis not present

## 2017-08-15 DIAGNOSIS — S72001D Fracture of unspecified part of neck of right femur, subsequent encounter for closed fracture with routine healing: Secondary | ICD-10-CM | POA: Diagnosis not present

## 2017-08-15 DIAGNOSIS — R001 Bradycardia, unspecified: Secondary | ICD-10-CM | POA: Diagnosis not present

## 2017-08-15 DIAGNOSIS — I509 Heart failure, unspecified: Secondary | ICD-10-CM | POA: Diagnosis not present

## 2017-08-15 DIAGNOSIS — I2510 Atherosclerotic heart disease of native coronary artery without angina pectoris: Secondary | ICD-10-CM | POA: Diagnosis not present

## 2017-08-15 DIAGNOSIS — F0390 Unspecified dementia without behavioral disturbance: Secondary | ICD-10-CM | POA: Diagnosis not present

## 2017-08-15 DIAGNOSIS — N189 Chronic kidney disease, unspecified: Secondary | ICD-10-CM | POA: Diagnosis not present

## 2017-08-15 NOTE — Progress Notes (Signed)
Office Visit Note   Patient: [name]           Date of Birth: May 03, 1935           MRN: [id number] Visit Date: 08/15/2017              Requested by: Babs Sciara, MD [address] [place], [place] [address] PCP: Babs Sciara, MD   Assessment & Plan: Visit Diagnoses:  1. Closed fracture of right hip with routine healing, subsequent encounter     Plan: Patient is 6 months status post right knee arthroplasty.  Overall she is doing well.  From my standpoint we can have her follow-up as needed.  Questions encouraged and answered.  Follow-Up Instructions: Return if symptoms worsen or fail to improve.   Orders:  Orders Placed This Encounter  Procedures  . XR HIP UNILAT W OR W/O PELVIS 2-3 VIEWS RIGHT   No orders of the defined types were placed in this encounter.     Procedures: No procedures performed   Clinical Data: No additional findings.   Subjective: Chief Complaint  Patient presents with  . Right Hip - Follow-up    Patient is 6 months right hip hemiarthroplasty.  She is overall doing well.  She is ambulating with a walker.  Does not have any real complaints.   Review of Systems   Objective: Vital Signs: There were no vitals taken for this visit.  Physical Exam  Ortho Exam Right hip exam shows a fully healed surgical scar.  Painless rotation of the hip.  Leg lengths are equal. Specialty Comments:  No specialty comments available.  Imaging: Xr Hip Unilat W Or W/o Pelvis 2-3 Views Right  Result Date: 08/15/2017 Stable partial hip replacement in good alignment.  No complications.    PMFS History: Patient Active Problem List   Diagnosis Date Noted  . Hypokalemia 02/11/2017  . Closed fracture of right hip (HCC)   . Chronic anxiety 08/23/2016  . Elevated troponin 01/13/2016  . Multiple fractures of ribs, right side, initial encounter for closed fracture 12/03/2015  . Rib fractures 12/03/2015  . Chronic respiratory failure  (HCC) 12/03/2015  . COPD (chronic obstructive pulmonary disease) (HCC) 12/03/2015  . Senile purpura (HCC) 09/07/2015  . Benign essential tremor 09/07/2015  . Chronic diastolic congestive heart failure (HCC) 04/20/2015  . Chronic respiratory failure with hypoxia (HCC) 04/11/2014  . CKD (chronic kidney disease) stage 3, GFR 30-59 ml/min (HCC) 04/11/2014  . Prediabetes 12/03/2013  . Hypertrophic obstructive cardiomyopathy (HCC) 11/09/2013  . Respiratory failure with hypoxia (HCC) 05/07/2013  . COPD exacerbation (HCC) 02/15/2013  . Hyperlipidemia 11/09/2010  . CAD (coronary artery disease) 09/18/2009  . BRADYCARDIA 08/19/2009  . Dementia 07/19/2007  . Essential hypertension 07/19/2007  . CAROTID ARTERY STENOSIS 07/19/2007  . ASTHMA 07/19/2007  . HEADACHE, CHRONIC, HX OF 07/19/2007   Past Medical History:  Diagnosis Date  . Acute on chronic diastolic heart failure (HCC) 04/11/2014  . Anxiety    [name] 02/15/2017  . Arthritis    "joints ache all the time" (02/15/2017)  . Asthma   . Bradycardia   . CAD (coronary artery disease) 08/2009   s/p PCI of the LAD  . Carotid artery stenosis   . CHF (congestive heart failure) (HCC)    [name] 02/15/2017  . Chronic bronchitis (HCC)   . CKD (chronic kidney disease), stage III (HCC)    [name] 02/15/2017  . COPD (chronic obstructive pulmonary disease) (HCC)   .  Daily headache    "since I had shingles on my face/head" (02/15/2017)  . Dementia    [name] 02/15/2017  . GERD (gastroesophageal reflux disease)   . Heart murmur   . History of blood transfusion 1970s; 02/15/2017   "S/P miscarriage; low blood" (02/15/2017)  . Hyperlipidemia   . Hypertension   . Impaired fasting glucose   . NSTEMI (non-ST elevated myocardial infarction) (HCC)   . On home O2    "4 1/2L w/100 foot cord; 24/7" (02/15/2017)  . Osteoporosis 2009  . Pneumonia    "several times" (02/15/2017)  . Seasonal allergies   . Ulnar neuropathy     Family History  Problem Relation Age of Onset    . Addison's disease Mother   . Alcohol abuse Father     Past Surgical History:  Procedure Laterality Date  . ANTERIOR APPROACH HEMI HIP ARTHROPLASTY Right 02/12/2017   Procedure: ANTERIOR APPROACH HEMI HIP ARTHROPLASTY;  Surgeon: Tarry Kos, MD;  Location: [facility] OR;  Service: Orthopedics;  Laterality: Right;  . CARDIAC CATHETERIZATION  11/2010   negative  . CARPAL TUNNEL RELEASE Right 2008   [name] 06/29/2010  . CATARACT EXTRACTION W/ INTRAOCULAR LENS  IMPLANT, BILATERAL Bilateral 12/2014  . CORONARY ANGIOPLASTY WITH STENT PLACEMENT    . DILATION AND CURETTAGE OF UTERUS    . FRACTURE SURGERY    . INCONTINENCE SURGERY  1991   "attached it to my pelvic bone"  . LEFT HEART CATHETERIZATION WITH CORONARY ANGIOGRAM N/A 11/05/2012   Procedure: LEFT HEART CATHETERIZATION WITH CORONARY ANGIOGRAM;  Surgeon: Micheline Chapman, MD;  Location: [facility] CATH [facility];  Service: Cardiovascular;  Laterality: N/A;  . TOTAL HIP ARTHROPLASTY    . VAGINAL HYSTERECTOMY  1984   partial   Social History   Occupational History  . Occupation: Retired    Associate Professor: RETIRED    Comment: Scientist, product/process development  Tobacco Use  . Smoking status: Former Smoker    Packs/day: 1.00    Years: 35.00    Pack years: 35.00    Last attempt to quit: 02/15/2008    Years since quitting: 9.5  . Smokeless tobacco: Never Used  Substance and Sexual Activity  . Alcohol use: No  . Drug use: No  . Sexual activity: Not Currently

## 2017-08-18 DIAGNOSIS — N189 Chronic kidney disease, unspecified: Secondary | ICD-10-CM | POA: Diagnosis not present

## 2017-08-18 DIAGNOSIS — G250 Essential tremor: Secondary | ICD-10-CM | POA: Diagnosis not present

## 2017-08-18 DIAGNOSIS — I509 Heart failure, unspecified: Secondary | ICD-10-CM | POA: Diagnosis not present

## 2017-08-18 DIAGNOSIS — F0390 Unspecified dementia without behavioral disturbance: Secondary | ICD-10-CM | POA: Diagnosis not present

## 2017-08-18 DIAGNOSIS — I2510 Atherosclerotic heart disease of native coronary artery without angina pectoris: Secondary | ICD-10-CM | POA: Diagnosis not present

## 2017-08-18 DIAGNOSIS — R001 Bradycardia, unspecified: Secondary | ICD-10-CM | POA: Diagnosis not present

## 2017-08-21 DIAGNOSIS — I509 Heart failure, unspecified: Secondary | ICD-10-CM | POA: Diagnosis not present

## 2017-08-21 DIAGNOSIS — F0390 Unspecified dementia without behavioral disturbance: Secondary | ICD-10-CM | POA: Diagnosis not present

## 2017-08-21 DIAGNOSIS — N189 Chronic kidney disease, unspecified: Secondary | ICD-10-CM | POA: Diagnosis not present

## 2017-08-21 DIAGNOSIS — I2510 Atherosclerotic heart disease of native coronary artery without angina pectoris: Secondary | ICD-10-CM | POA: Diagnosis not present

## 2017-08-21 DIAGNOSIS — R001 Bradycardia, unspecified: Secondary | ICD-10-CM | POA: Diagnosis not present

## 2017-08-21 DIAGNOSIS — G250 Essential tremor: Secondary | ICD-10-CM | POA: Diagnosis not present

## 2017-08-22 DIAGNOSIS — I509 Heart failure, unspecified: Secondary | ICD-10-CM | POA: Diagnosis not present

## 2017-08-22 DIAGNOSIS — F0390 Unspecified dementia without behavioral disturbance: Secondary | ICD-10-CM | POA: Diagnosis not present

## 2017-08-22 DIAGNOSIS — R001 Bradycardia, unspecified: Secondary | ICD-10-CM | POA: Diagnosis not present

## 2017-08-22 DIAGNOSIS — N189 Chronic kidney disease, unspecified: Secondary | ICD-10-CM | POA: Diagnosis not present

## 2017-08-22 DIAGNOSIS — I2510 Atherosclerotic heart disease of native coronary artery without angina pectoris: Secondary | ICD-10-CM | POA: Diagnosis not present

## 2017-08-22 DIAGNOSIS — G250 Essential tremor: Secondary | ICD-10-CM | POA: Diagnosis not present

## 2017-08-24 DIAGNOSIS — F0390 Unspecified dementia without behavioral disturbance: Secondary | ICD-10-CM | POA: Diagnosis not present

## 2017-08-24 DIAGNOSIS — I2510 Atherosclerotic heart disease of native coronary artery without angina pectoris: Secondary | ICD-10-CM | POA: Diagnosis not present

## 2017-08-24 DIAGNOSIS — N189 Chronic kidney disease, unspecified: Secondary | ICD-10-CM | POA: Diagnosis not present

## 2017-08-24 DIAGNOSIS — R001 Bradycardia, unspecified: Secondary | ICD-10-CM | POA: Diagnosis not present

## 2017-08-24 DIAGNOSIS — I509 Heart failure, unspecified: Secondary | ICD-10-CM | POA: Diagnosis not present

## 2017-08-24 DIAGNOSIS — G250 Essential tremor: Secondary | ICD-10-CM | POA: Diagnosis not present

## 2017-08-25 DIAGNOSIS — I509 Heart failure, unspecified: Secondary | ICD-10-CM | POA: Diagnosis not present

## 2017-08-25 DIAGNOSIS — N189 Chronic kidney disease, unspecified: Secondary | ICD-10-CM | POA: Diagnosis not present

## 2017-08-25 DIAGNOSIS — H34832 Tributary (branch) retinal vein occlusion, left eye, with macular edema: Secondary | ICD-10-CM | POA: Diagnosis not present

## 2017-08-25 DIAGNOSIS — F0390 Unspecified dementia without behavioral disturbance: Secondary | ICD-10-CM | POA: Diagnosis not present

## 2017-08-25 DIAGNOSIS — I2510 Atherosclerotic heart disease of native coronary artery without angina pectoris: Secondary | ICD-10-CM | POA: Diagnosis not present

## 2017-08-25 DIAGNOSIS — G250 Essential tremor: Secondary | ICD-10-CM | POA: Diagnosis not present

## 2017-08-25 DIAGNOSIS — H26492 Other secondary cataract, left eye: Secondary | ICD-10-CM | POA: Diagnosis not present

## 2017-08-25 DIAGNOSIS — R001 Bradycardia, unspecified: Secondary | ICD-10-CM | POA: Diagnosis not present

## 2017-08-29 DIAGNOSIS — G250 Essential tremor: Secondary | ICD-10-CM | POA: Diagnosis not present

## 2017-08-29 DIAGNOSIS — F0390 Unspecified dementia without behavioral disturbance: Secondary | ICD-10-CM | POA: Diagnosis not present

## 2017-08-29 DIAGNOSIS — I2510 Atherosclerotic heart disease of native coronary artery without angina pectoris: Secondary | ICD-10-CM | POA: Diagnosis not present

## 2017-08-29 DIAGNOSIS — R001 Bradycardia, unspecified: Secondary | ICD-10-CM | POA: Diagnosis not present

## 2017-08-29 DIAGNOSIS — N189 Chronic kidney disease, unspecified: Secondary | ICD-10-CM | POA: Diagnosis not present

## 2017-08-29 DIAGNOSIS — I509 Heart failure, unspecified: Secondary | ICD-10-CM | POA: Diagnosis not present

## 2017-08-31 DIAGNOSIS — G250 Essential tremor: Secondary | ICD-10-CM | POA: Diagnosis not present

## 2017-08-31 DIAGNOSIS — I509 Heart failure, unspecified: Secondary | ICD-10-CM | POA: Diagnosis not present

## 2017-08-31 DIAGNOSIS — N189 Chronic kidney disease, unspecified: Secondary | ICD-10-CM | POA: Diagnosis not present

## 2017-08-31 DIAGNOSIS — R001 Bradycardia, unspecified: Secondary | ICD-10-CM | POA: Diagnosis not present

## 2017-08-31 DIAGNOSIS — I2510 Atherosclerotic heart disease of native coronary artery without angina pectoris: Secondary | ICD-10-CM | POA: Diagnosis not present

## 2017-08-31 DIAGNOSIS — F0390 Unspecified dementia without behavioral disturbance: Secondary | ICD-10-CM | POA: Diagnosis not present

## 2017-09-04 DIAGNOSIS — F0390 Unspecified dementia without behavioral disturbance: Secondary | ICD-10-CM | POA: Diagnosis not present

## 2017-09-04 DIAGNOSIS — N189 Chronic kidney disease, unspecified: Secondary | ICD-10-CM | POA: Diagnosis not present

## 2017-09-04 DIAGNOSIS — R001 Bradycardia, unspecified: Secondary | ICD-10-CM | POA: Diagnosis not present

## 2017-09-04 DIAGNOSIS — G250 Essential tremor: Secondary | ICD-10-CM | POA: Diagnosis not present

## 2017-09-04 DIAGNOSIS — I2510 Atherosclerotic heart disease of native coronary artery without angina pectoris: Secondary | ICD-10-CM | POA: Diagnosis not present

## 2017-09-04 DIAGNOSIS — I509 Heart failure, unspecified: Secondary | ICD-10-CM | POA: Diagnosis not present

## 2017-09-06 DIAGNOSIS — N189 Chronic kidney disease, unspecified: Secondary | ICD-10-CM | POA: Diagnosis not present

## 2017-09-06 DIAGNOSIS — I509 Heart failure, unspecified: Secondary | ICD-10-CM | POA: Diagnosis not present

## 2017-09-06 DIAGNOSIS — R001 Bradycardia, unspecified: Secondary | ICD-10-CM | POA: Diagnosis not present

## 2017-09-06 DIAGNOSIS — F0390 Unspecified dementia without behavioral disturbance: Secondary | ICD-10-CM | POA: Diagnosis not present

## 2017-09-06 DIAGNOSIS — G250 Essential tremor: Secondary | ICD-10-CM | POA: Diagnosis not present

## 2017-09-06 DIAGNOSIS — I2510 Atherosclerotic heart disease of native coronary artery without angina pectoris: Secondary | ICD-10-CM | POA: Diagnosis not present

## 2017-09-07 DIAGNOSIS — I509 Heart failure, unspecified: Secondary | ICD-10-CM | POA: Diagnosis not present

## 2017-09-07 DIAGNOSIS — G250 Essential tremor: Secondary | ICD-10-CM | POA: Diagnosis not present

## 2017-09-07 DIAGNOSIS — R001 Bradycardia, unspecified: Secondary | ICD-10-CM | POA: Diagnosis not present

## 2017-09-07 DIAGNOSIS — F0390 Unspecified dementia without behavioral disturbance: Secondary | ICD-10-CM | POA: Diagnosis not present

## 2017-09-07 DIAGNOSIS — N189 Chronic kidney disease, unspecified: Secondary | ICD-10-CM | POA: Diagnosis not present

## 2017-09-07 DIAGNOSIS — I2510 Atherosclerotic heart disease of native coronary artery without angina pectoris: Secondary | ICD-10-CM | POA: Diagnosis not present

## 2017-09-08 DIAGNOSIS — I509 Heart failure, unspecified: Secondary | ICD-10-CM | POA: Diagnosis not present

## 2017-09-08 DIAGNOSIS — I2510 Atherosclerotic heart disease of native coronary artery without angina pectoris: Secondary | ICD-10-CM | POA: Diagnosis not present

## 2017-09-08 DIAGNOSIS — F0390 Unspecified dementia without behavioral disturbance: Secondary | ICD-10-CM | POA: Diagnosis not present

## 2017-09-08 DIAGNOSIS — N189 Chronic kidney disease, unspecified: Secondary | ICD-10-CM | POA: Diagnosis not present

## 2017-09-08 DIAGNOSIS — G250 Essential tremor: Secondary | ICD-10-CM | POA: Diagnosis not present

## 2017-09-08 DIAGNOSIS — R001 Bradycardia, unspecified: Secondary | ICD-10-CM | POA: Diagnosis not present

## 2017-09-12 DIAGNOSIS — F0390 Unspecified dementia without behavioral disturbance: Secondary | ICD-10-CM | POA: Diagnosis not present

## 2017-09-12 DIAGNOSIS — G250 Essential tremor: Secondary | ICD-10-CM | POA: Diagnosis not present

## 2017-09-12 DIAGNOSIS — I2510 Atherosclerotic heart disease of native coronary artery without angina pectoris: Secondary | ICD-10-CM | POA: Diagnosis not present

## 2017-09-12 DIAGNOSIS — R001 Bradycardia, unspecified: Secondary | ICD-10-CM | POA: Diagnosis not present

## 2017-09-12 DIAGNOSIS — I509 Heart failure, unspecified: Secondary | ICD-10-CM | POA: Diagnosis not present

## 2017-09-12 DIAGNOSIS — N189 Chronic kidney disease, unspecified: Secondary | ICD-10-CM | POA: Diagnosis not present

## 2017-09-13 ENCOUNTER — Other Ambulatory Visit: Payer: Self-pay

## 2017-09-13 MED ORDER — LOSARTAN POTASSIUM 25 MG PO TABS: 25.0000 mg | ORAL_TABLET | Freq: Every day | ORAL | 1 refills | Status: AC

## 2017-09-14 DIAGNOSIS — I509 Heart failure, unspecified: Secondary | ICD-10-CM | POA: Diagnosis not present

## 2017-09-14 DIAGNOSIS — I2781 Cor pulmonale (chronic): Secondary | ICD-10-CM | POA: Diagnosis not present

## 2017-09-14 DIAGNOSIS — N189 Chronic kidney disease, unspecified: Secondary | ICD-10-CM | POA: Diagnosis not present

## 2017-09-14 DIAGNOSIS — E785 Hyperlipidemia, unspecified: Secondary | ICD-10-CM | POA: Diagnosis not present

## 2017-09-14 DIAGNOSIS — F0390 Unspecified dementia without behavioral disturbance: Secondary | ICD-10-CM | POA: Diagnosis not present

## 2017-09-14 DIAGNOSIS — I2510 Atherosclerotic heart disease of native coronary artery without angina pectoris: Secondary | ICD-10-CM | POA: Diagnosis not present

## 2017-09-14 DIAGNOSIS — G250 Essential tremor: Secondary | ICD-10-CM | POA: Diagnosis not present

## 2017-09-14 DIAGNOSIS — R001 Bradycardia, unspecified: Secondary | ICD-10-CM | POA: Diagnosis not present

## 2017-09-19 ENCOUNTER — Telehealth: Payer: Self-pay | Admitting: Family Medicine

## 2017-09-19 DIAGNOSIS — I509 Heart failure, unspecified: Secondary | ICD-10-CM | POA: Diagnosis not present

## 2017-09-19 DIAGNOSIS — F0390 Unspecified dementia without behavioral disturbance: Secondary | ICD-10-CM | POA: Diagnosis not present

## 2017-09-19 DIAGNOSIS — G250 Essential tremor: Secondary | ICD-10-CM | POA: Diagnosis not present

## 2017-09-19 DIAGNOSIS — N189 Chronic kidney disease, unspecified: Secondary | ICD-10-CM | POA: Diagnosis not present

## 2017-09-19 DIAGNOSIS — I2510 Atherosclerotic heart disease of native coronary artery without angina pectoris: Secondary | ICD-10-CM | POA: Diagnosis not present

## 2017-09-19 DIAGNOSIS — R001 Bradycardia, unspecified: Secondary | ICD-10-CM | POA: Diagnosis not present

## 2017-09-19 NOTE — Telephone Encounter (Signed)
So noted 

## 2017-09-19 NOTE — Telephone Encounter (Signed)
FYI-Hospice nurse Stacie wanted you to know that patient fell on 8/4 but not injured and refused medical treatment. She had a small bruise on hip and elbow.Stacie was calling to let you know of patient fall. If any question her number is [phone]

## 2017-09-21 DIAGNOSIS — F0390 Unspecified dementia without behavioral disturbance: Secondary | ICD-10-CM | POA: Diagnosis not present

## 2017-09-21 DIAGNOSIS — I509 Heart failure, unspecified: Secondary | ICD-10-CM | POA: Diagnosis not present

## 2017-09-21 DIAGNOSIS — R001 Bradycardia, unspecified: Secondary | ICD-10-CM | POA: Diagnosis not present

## 2017-09-21 DIAGNOSIS — I2510 Atherosclerotic heart disease of native coronary artery without angina pectoris: Secondary | ICD-10-CM | POA: Diagnosis not present

## 2017-09-21 DIAGNOSIS — G250 Essential tremor: Secondary | ICD-10-CM | POA: Diagnosis not present

## 2017-09-21 DIAGNOSIS — N189 Chronic kidney disease, unspecified: Secondary | ICD-10-CM | POA: Diagnosis not present

## 2017-09-26 DIAGNOSIS — N189 Chronic kidney disease, unspecified: Secondary | ICD-10-CM | POA: Diagnosis not present

## 2017-09-26 DIAGNOSIS — I2510 Atherosclerotic heart disease of native coronary artery without angina pectoris: Secondary | ICD-10-CM | POA: Diagnosis not present

## 2017-09-26 DIAGNOSIS — I509 Heart failure, unspecified: Secondary | ICD-10-CM | POA: Diagnosis not present

## 2017-09-26 DIAGNOSIS — R001 Bradycardia, unspecified: Secondary | ICD-10-CM | POA: Diagnosis not present

## 2017-09-26 DIAGNOSIS — F0390 Unspecified dementia without behavioral disturbance: Secondary | ICD-10-CM | POA: Diagnosis not present

## 2017-09-26 DIAGNOSIS — G250 Essential tremor: Secondary | ICD-10-CM | POA: Diagnosis not present

## 2017-09-28 DIAGNOSIS — F0390 Unspecified dementia without behavioral disturbance: Secondary | ICD-10-CM | POA: Diagnosis not present

## 2017-09-28 DIAGNOSIS — I2510 Atherosclerotic heart disease of native coronary artery without angina pectoris: Secondary | ICD-10-CM | POA: Diagnosis not present

## 2017-09-28 DIAGNOSIS — I509 Heart failure, unspecified: Secondary | ICD-10-CM | POA: Diagnosis not present

## 2017-09-28 DIAGNOSIS — R001 Bradycardia, unspecified: Secondary | ICD-10-CM | POA: Diagnosis not present

## 2017-09-28 DIAGNOSIS — N189 Chronic kidney disease, unspecified: Secondary | ICD-10-CM | POA: Diagnosis not present

## 2017-09-28 DIAGNOSIS — G250 Essential tremor: Secondary | ICD-10-CM | POA: Diagnosis not present

## 2017-09-29 DIAGNOSIS — N189 Chronic kidney disease, unspecified: Secondary | ICD-10-CM | POA: Diagnosis not present

## 2017-09-29 DIAGNOSIS — G250 Essential tremor: Secondary | ICD-10-CM | POA: Diagnosis not present

## 2017-09-29 DIAGNOSIS — I2510 Atherosclerotic heart disease of native coronary artery without angina pectoris: Secondary | ICD-10-CM | POA: Diagnosis not present

## 2017-09-29 DIAGNOSIS — I509 Heart failure, unspecified: Secondary | ICD-10-CM | POA: Diagnosis not present

## 2017-09-29 DIAGNOSIS — F0390 Unspecified dementia without behavioral disturbance: Secondary | ICD-10-CM | POA: Diagnosis not present

## 2017-09-29 DIAGNOSIS — R001 Bradycardia, unspecified: Secondary | ICD-10-CM | POA: Diagnosis not present

## 2017-10-03 DIAGNOSIS — F0390 Unspecified dementia without behavioral disturbance: Secondary | ICD-10-CM | POA: Diagnosis not present

## 2017-10-03 DIAGNOSIS — G250 Essential tremor: Secondary | ICD-10-CM | POA: Diagnosis not present

## 2017-10-03 DIAGNOSIS — I509 Heart failure, unspecified: Secondary | ICD-10-CM | POA: Diagnosis not present

## 2017-10-03 DIAGNOSIS — I2510 Atherosclerotic heart disease of native coronary artery without angina pectoris: Secondary | ICD-10-CM | POA: Diagnosis not present

## 2017-10-03 DIAGNOSIS — R001 Bradycardia, unspecified: Secondary | ICD-10-CM | POA: Diagnosis not present

## 2017-10-03 DIAGNOSIS — N189 Chronic kidney disease, unspecified: Secondary | ICD-10-CM | POA: Diagnosis not present

## 2017-10-04 ENCOUNTER — Encounter: Payer: Self-pay | Admitting: Family Medicine

## 2017-10-04 ENCOUNTER — Ambulatory Visit (INDEPENDENT_AMBULATORY_CARE_PROVIDER_SITE_OTHER): Payer: Medicare Other | Admitting: Family Medicine

## 2017-10-04 ENCOUNTER — Other Ambulatory Visit: Payer: Self-pay | Admitting: Family Medicine

## 2017-10-04 VITALS — BP 118/70 | Temp 98.5°F | Ht 63.0 in | Wt 167.0 lb

## 2017-10-04 DIAGNOSIS — N189 Chronic kidney disease, unspecified: Secondary | ICD-10-CM | POA: Diagnosis not present

## 2017-10-04 DIAGNOSIS — R600 Localized edema: Secondary | ICD-10-CM

## 2017-10-04 DIAGNOSIS — I2510 Atherosclerotic heart disease of native coronary artery without angina pectoris: Secondary | ICD-10-CM | POA: Diagnosis not present

## 2017-10-04 DIAGNOSIS — G250 Essential tremor: Secondary | ICD-10-CM | POA: Diagnosis not present

## 2017-10-04 DIAGNOSIS — I509 Heart failure, unspecified: Secondary | ICD-10-CM | POA: Diagnosis not present

## 2017-10-04 DIAGNOSIS — F0390 Unspecified dementia without behavioral disturbance: Secondary | ICD-10-CM | POA: Diagnosis not present

## 2017-10-04 DIAGNOSIS — R001 Bradycardia, unspecified: Secondary | ICD-10-CM | POA: Diagnosis not present

## 2017-10-04 NOTE — Progress Notes (Addendum)
   Subjective:    Patient ID: [name], female    DOB: 06-Apr-1935, 82 y.o.   MRN: [id number]  HPIbilateral swelling in feet and ankles. Started one week ago.  Intermittent swelling in the ankles Has underlying COPD CHF Hospice care  Runny nose and wheezing. Pt states she always has this.  No vomiting no diarrhea No high fever Mental orientation good Very nice patient   Review of Systems  Constitutional: Negative for activity change, appetite change and fatigue.  HENT: Negative for congestion and rhinorrhea.   Respiratory: Positive for shortness of breath. Negative for cough.   Cardiovascular: Positive for leg swelling. Negative for chest pain.  Gastrointestinal: Negative for abdominal pain and diarrhea.  Endocrine: Negative for polydipsia and polyphagia.  Skin: Negative for color change.  Neurological: Negative for dizziness and weakness.  Psychiatric/Behavioral: Negative for behavioral problems and confusion.       Objective:   Physical Exam  Constitutional: She appears well-nourished. No distress.  HENT:  Head: Normocephalic and atraumatic.  Eyes: Right eye exhibits no discharge. Left eye exhibits no discharge.  Neck: No tracheal deviation present.  Cardiovascular: Normal rate, regular rhythm and normal heart sounds.  No murmur heard. Pulmonary/Chest: Effort normal and breath sounds normal. No respiratory distress.  Musculoskeletal: She exhibits no edema.  Lymphadenopathy:    She has no cervical adenopathy.  Neurological: She is alert. Coordination normal.  Skin: Skin is warm and dry.  Psychiatric: She has a normal mood and affect. Her behavior is normal.  Vitals reviewed.   Some swelling in the feet but not severe No sounds of fluid in the lung currently Stop amlodipine Blood pressure very good control currently Face-to-face evaluation completed Patient with significant pedal edema fatigue tiredness Would benefit from walker    Assessment & Plan:    Pedal edema-I believe that this is a combination of her underlying congestive heart failure and her blood pressure medicine plus also sedentary activity I also believe that salt intake plays a role Patient was encouraged to minimize salt in diet Blood pressure a little bit on the lower end of normal I recommend stopping amlodipine Family will give [name] update in a couple weeks time

## 2017-10-04 NOTE — Patient Instructions (Signed)
Stop amlodipine.

## 2017-10-05 DIAGNOSIS — G250 Essential tremor: Secondary | ICD-10-CM | POA: Diagnosis not present

## 2017-10-05 DIAGNOSIS — I2510 Atherosclerotic heart disease of native coronary artery without angina pectoris: Secondary | ICD-10-CM | POA: Diagnosis not present

## 2017-10-05 DIAGNOSIS — R001 Bradycardia, unspecified: Secondary | ICD-10-CM | POA: Diagnosis not present

## 2017-10-05 DIAGNOSIS — I509 Heart failure, unspecified: Secondary | ICD-10-CM | POA: Diagnosis not present

## 2017-10-05 DIAGNOSIS — N189 Chronic kidney disease, unspecified: Secondary | ICD-10-CM | POA: Diagnosis not present

## 2017-10-05 DIAGNOSIS — F0390 Unspecified dementia without behavioral disturbance: Secondary | ICD-10-CM | POA: Diagnosis not present

## 2017-10-06 DIAGNOSIS — I509 Heart failure, unspecified: Secondary | ICD-10-CM | POA: Diagnosis not present

## 2017-10-06 DIAGNOSIS — I2510 Atherosclerotic heart disease of native coronary artery without angina pectoris: Secondary | ICD-10-CM | POA: Diagnosis not present

## 2017-10-06 DIAGNOSIS — F0390 Unspecified dementia without behavioral disturbance: Secondary | ICD-10-CM | POA: Diagnosis not present

## 2017-10-06 DIAGNOSIS — R001 Bradycardia, unspecified: Secondary | ICD-10-CM | POA: Diagnosis not present

## 2017-10-06 DIAGNOSIS — N189 Chronic kidney disease, unspecified: Secondary | ICD-10-CM | POA: Diagnosis not present

## 2017-10-06 DIAGNOSIS — G250 Essential tremor: Secondary | ICD-10-CM | POA: Diagnosis not present

## 2017-10-10 DIAGNOSIS — R001 Bradycardia, unspecified: Secondary | ICD-10-CM | POA: Diagnosis not present

## 2017-10-10 DIAGNOSIS — F0390 Unspecified dementia without behavioral disturbance: Secondary | ICD-10-CM | POA: Diagnosis not present

## 2017-10-10 DIAGNOSIS — G250 Essential tremor: Secondary | ICD-10-CM | POA: Diagnosis not present

## 2017-10-10 DIAGNOSIS — N189 Chronic kidney disease, unspecified: Secondary | ICD-10-CM | POA: Diagnosis not present

## 2017-10-10 DIAGNOSIS — I509 Heart failure, unspecified: Secondary | ICD-10-CM | POA: Diagnosis not present

## 2017-10-10 DIAGNOSIS — I2510 Atherosclerotic heart disease of native coronary artery without angina pectoris: Secondary | ICD-10-CM | POA: Diagnosis not present

## 2017-10-11 DIAGNOSIS — R001 Bradycardia, unspecified: Secondary | ICD-10-CM | POA: Diagnosis not present

## 2017-10-11 DIAGNOSIS — G250 Essential tremor: Secondary | ICD-10-CM | POA: Diagnosis not present

## 2017-10-11 DIAGNOSIS — I2510 Atherosclerotic heart disease of native coronary artery without angina pectoris: Secondary | ICD-10-CM | POA: Diagnosis not present

## 2017-10-11 DIAGNOSIS — F0390 Unspecified dementia without behavioral disturbance: Secondary | ICD-10-CM | POA: Diagnosis not present

## 2017-10-11 DIAGNOSIS — I509 Heart failure, unspecified: Secondary | ICD-10-CM | POA: Diagnosis not present

## 2017-10-11 DIAGNOSIS — N189 Chronic kidney disease, unspecified: Secondary | ICD-10-CM | POA: Diagnosis not present

## 2017-10-12 DIAGNOSIS — G250 Essential tremor: Secondary | ICD-10-CM | POA: Diagnosis not present

## 2017-10-12 DIAGNOSIS — I509 Heart failure, unspecified: Secondary | ICD-10-CM | POA: Diagnosis not present

## 2017-10-12 DIAGNOSIS — N189 Chronic kidney disease, unspecified: Secondary | ICD-10-CM | POA: Diagnosis not present

## 2017-10-12 DIAGNOSIS — F0390 Unspecified dementia without behavioral disturbance: Secondary | ICD-10-CM | POA: Diagnosis not present

## 2017-10-12 DIAGNOSIS — R001 Bradycardia, unspecified: Secondary | ICD-10-CM | POA: Diagnosis not present

## 2017-10-12 DIAGNOSIS — I2510 Atherosclerotic heart disease of native coronary artery without angina pectoris: Secondary | ICD-10-CM | POA: Diagnosis not present

## 2017-10-13 DIAGNOSIS — I509 Heart failure, unspecified: Secondary | ICD-10-CM | POA: Diagnosis not present

## 2017-10-13 DIAGNOSIS — G250 Essential tremor: Secondary | ICD-10-CM | POA: Diagnosis not present

## 2017-10-13 DIAGNOSIS — I2510 Atherosclerotic heart disease of native coronary artery without angina pectoris: Secondary | ICD-10-CM | POA: Diagnosis not present

## 2017-10-13 DIAGNOSIS — F0390 Unspecified dementia without behavioral disturbance: Secondary | ICD-10-CM | POA: Diagnosis not present

## 2017-10-13 DIAGNOSIS — R001 Bradycardia, unspecified: Secondary | ICD-10-CM | POA: Diagnosis not present

## 2017-10-13 DIAGNOSIS — N189 Chronic kidney disease, unspecified: Secondary | ICD-10-CM | POA: Diagnosis not present

## 2017-10-15 DIAGNOSIS — E785 Hyperlipidemia, unspecified: Secondary | ICD-10-CM | POA: Diagnosis not present

## 2017-10-15 DIAGNOSIS — F0390 Unspecified dementia without behavioral disturbance: Secondary | ICD-10-CM | POA: Diagnosis not present

## 2017-10-15 DIAGNOSIS — N189 Chronic kidney disease, unspecified: Secondary | ICD-10-CM | POA: Diagnosis not present

## 2017-10-15 DIAGNOSIS — I2781 Cor pulmonale (chronic): Secondary | ICD-10-CM | POA: Diagnosis not present

## 2017-10-15 DIAGNOSIS — I509 Heart failure, unspecified: Secondary | ICD-10-CM | POA: Diagnosis not present

## 2017-10-15 DIAGNOSIS — G250 Essential tremor: Secondary | ICD-10-CM | POA: Diagnosis not present

## 2017-10-15 DIAGNOSIS — I2510 Atherosclerotic heart disease of native coronary artery without angina pectoris: Secondary | ICD-10-CM | POA: Diagnosis not present

## 2017-10-15 DIAGNOSIS — R001 Bradycardia, unspecified: Secondary | ICD-10-CM | POA: Diagnosis not present

## 2017-10-17 DIAGNOSIS — I509 Heart failure, unspecified: Secondary | ICD-10-CM | POA: Diagnosis not present

## 2017-10-17 DIAGNOSIS — R001 Bradycardia, unspecified: Secondary | ICD-10-CM | POA: Diagnosis not present

## 2017-10-17 DIAGNOSIS — N189 Chronic kidney disease, unspecified: Secondary | ICD-10-CM | POA: Diagnosis not present

## 2017-10-17 DIAGNOSIS — F0390 Unspecified dementia without behavioral disturbance: Secondary | ICD-10-CM | POA: Diagnosis not present

## 2017-10-17 DIAGNOSIS — G250 Essential tremor: Secondary | ICD-10-CM | POA: Diagnosis not present

## 2017-10-17 DIAGNOSIS — I2510 Atherosclerotic heart disease of native coronary artery without angina pectoris: Secondary | ICD-10-CM | POA: Diagnosis not present

## 2017-10-18 DIAGNOSIS — G250 Essential tremor: Secondary | ICD-10-CM | POA: Diagnosis not present

## 2017-10-18 DIAGNOSIS — N189 Chronic kidney disease, unspecified: Secondary | ICD-10-CM | POA: Diagnosis not present

## 2017-10-18 DIAGNOSIS — I509 Heart failure, unspecified: Secondary | ICD-10-CM | POA: Diagnosis not present

## 2017-10-18 DIAGNOSIS — I2510 Atherosclerotic heart disease of native coronary artery without angina pectoris: Secondary | ICD-10-CM | POA: Diagnosis not present

## 2017-10-18 DIAGNOSIS — R001 Bradycardia, unspecified: Secondary | ICD-10-CM | POA: Diagnosis not present

## 2017-10-18 DIAGNOSIS — F0390 Unspecified dementia without behavioral disturbance: Secondary | ICD-10-CM | POA: Diagnosis not present

## 2017-10-19 DIAGNOSIS — I509 Heart failure, unspecified: Secondary | ICD-10-CM | POA: Diagnosis not present

## 2017-10-19 DIAGNOSIS — N189 Chronic kidney disease, unspecified: Secondary | ICD-10-CM | POA: Diagnosis not present

## 2017-10-19 DIAGNOSIS — G250 Essential tremor: Secondary | ICD-10-CM | POA: Diagnosis not present

## 2017-10-19 DIAGNOSIS — F0390 Unspecified dementia without behavioral disturbance: Secondary | ICD-10-CM | POA: Diagnosis not present

## 2017-10-19 DIAGNOSIS — R001 Bradycardia, unspecified: Secondary | ICD-10-CM | POA: Diagnosis not present

## 2017-10-19 DIAGNOSIS — I2510 Atherosclerotic heart disease of native coronary artery without angina pectoris: Secondary | ICD-10-CM | POA: Diagnosis not present

## 2017-10-20 DIAGNOSIS — F0390 Unspecified dementia without behavioral disturbance: Secondary | ICD-10-CM | POA: Diagnosis not present

## 2017-10-20 DIAGNOSIS — I2510 Atherosclerotic heart disease of native coronary artery without angina pectoris: Secondary | ICD-10-CM | POA: Diagnosis not present

## 2017-10-20 DIAGNOSIS — I509 Heart failure, unspecified: Secondary | ICD-10-CM | POA: Diagnosis not present

## 2017-10-20 DIAGNOSIS — G250 Essential tremor: Secondary | ICD-10-CM | POA: Diagnosis not present

## 2017-10-20 DIAGNOSIS — N189 Chronic kidney disease, unspecified: Secondary | ICD-10-CM | POA: Diagnosis not present

## 2017-10-20 DIAGNOSIS — R001 Bradycardia, unspecified: Secondary | ICD-10-CM | POA: Diagnosis not present

## 2017-10-24 DIAGNOSIS — N189 Chronic kidney disease, unspecified: Secondary | ICD-10-CM | POA: Diagnosis not present

## 2017-10-24 DIAGNOSIS — I2510 Atherosclerotic heart disease of native coronary artery without angina pectoris: Secondary | ICD-10-CM | POA: Diagnosis not present

## 2017-10-24 DIAGNOSIS — G250 Essential tremor: Secondary | ICD-10-CM | POA: Diagnosis not present

## 2017-10-24 DIAGNOSIS — F0390 Unspecified dementia without behavioral disturbance: Secondary | ICD-10-CM | POA: Diagnosis not present

## 2017-10-24 DIAGNOSIS — I509 Heart failure, unspecified: Secondary | ICD-10-CM | POA: Diagnosis not present

## 2017-10-24 DIAGNOSIS — R001 Bradycardia, unspecified: Secondary | ICD-10-CM | POA: Diagnosis not present

## 2017-10-25 DIAGNOSIS — F0390 Unspecified dementia without behavioral disturbance: Secondary | ICD-10-CM | POA: Diagnosis not present

## 2017-10-25 DIAGNOSIS — G250 Essential tremor: Secondary | ICD-10-CM | POA: Diagnosis not present

## 2017-10-25 DIAGNOSIS — I509 Heart failure, unspecified: Secondary | ICD-10-CM | POA: Diagnosis not present

## 2017-10-25 DIAGNOSIS — N189 Chronic kidney disease, unspecified: Secondary | ICD-10-CM | POA: Diagnosis not present

## 2017-10-25 DIAGNOSIS — R001 Bradycardia, unspecified: Secondary | ICD-10-CM | POA: Diagnosis not present

## 2017-10-25 DIAGNOSIS — I2510 Atherosclerotic heart disease of native coronary artery without angina pectoris: Secondary | ICD-10-CM | POA: Diagnosis not present

## 2017-10-26 ENCOUNTER — Other Ambulatory Visit: Payer: Self-pay | Admitting: Family Medicine

## 2017-10-26 DIAGNOSIS — N189 Chronic kidney disease, unspecified: Secondary | ICD-10-CM | POA: Diagnosis not present

## 2017-10-26 DIAGNOSIS — G250 Essential tremor: Secondary | ICD-10-CM | POA: Diagnosis not present

## 2017-10-26 DIAGNOSIS — F0390 Unspecified dementia without behavioral disturbance: Secondary | ICD-10-CM | POA: Diagnosis not present

## 2017-10-26 DIAGNOSIS — I509 Heart failure, unspecified: Secondary | ICD-10-CM | POA: Diagnosis not present

## 2017-10-26 DIAGNOSIS — R001 Bradycardia, unspecified: Secondary | ICD-10-CM | POA: Diagnosis not present

## 2017-10-26 DIAGNOSIS — I2510 Atherosclerotic heart disease of native coronary artery without angina pectoris: Secondary | ICD-10-CM | POA: Diagnosis not present

## 2017-10-27 DIAGNOSIS — F0390 Unspecified dementia without behavioral disturbance: Secondary | ICD-10-CM | POA: Diagnosis not present

## 2017-10-27 DIAGNOSIS — N189 Chronic kidney disease, unspecified: Secondary | ICD-10-CM | POA: Diagnosis not present

## 2017-10-27 DIAGNOSIS — G250 Essential tremor: Secondary | ICD-10-CM | POA: Diagnosis not present

## 2017-10-27 DIAGNOSIS — R001 Bradycardia, unspecified: Secondary | ICD-10-CM | POA: Diagnosis not present

## 2017-10-27 DIAGNOSIS — I509 Heart failure, unspecified: Secondary | ICD-10-CM | POA: Diagnosis not present

## 2017-10-27 DIAGNOSIS — I2510 Atherosclerotic heart disease of native coronary artery without angina pectoris: Secondary | ICD-10-CM | POA: Diagnosis not present

## 2017-10-31 DIAGNOSIS — I509 Heart failure, unspecified: Secondary | ICD-10-CM | POA: Diagnosis not present

## 2017-10-31 DIAGNOSIS — N189 Chronic kidney disease, unspecified: Secondary | ICD-10-CM | POA: Diagnosis not present

## 2017-10-31 DIAGNOSIS — F0390 Unspecified dementia without behavioral disturbance: Secondary | ICD-10-CM | POA: Diagnosis not present

## 2017-10-31 DIAGNOSIS — I2510 Atherosclerotic heart disease of native coronary artery without angina pectoris: Secondary | ICD-10-CM | POA: Diagnosis not present

## 2017-10-31 DIAGNOSIS — G250 Essential tremor: Secondary | ICD-10-CM | POA: Diagnosis not present

## 2017-10-31 DIAGNOSIS — R001 Bradycardia, unspecified: Secondary | ICD-10-CM | POA: Diagnosis not present

## 2017-11-01 DIAGNOSIS — R001 Bradycardia, unspecified: Secondary | ICD-10-CM | POA: Diagnosis not present

## 2017-11-01 DIAGNOSIS — I509 Heart failure, unspecified: Secondary | ICD-10-CM | POA: Diagnosis not present

## 2017-11-01 DIAGNOSIS — N189 Chronic kidney disease, unspecified: Secondary | ICD-10-CM | POA: Diagnosis not present

## 2017-11-01 DIAGNOSIS — I2510 Atherosclerotic heart disease of native coronary artery without angina pectoris: Secondary | ICD-10-CM | POA: Diagnosis not present

## 2017-11-01 DIAGNOSIS — F0390 Unspecified dementia without behavioral disturbance: Secondary | ICD-10-CM | POA: Diagnosis not present

## 2017-11-01 DIAGNOSIS — G250 Essential tremor: Secondary | ICD-10-CM | POA: Diagnosis not present

## 2017-11-02 DIAGNOSIS — F0390 Unspecified dementia without behavioral disturbance: Secondary | ICD-10-CM | POA: Diagnosis not present

## 2017-11-02 DIAGNOSIS — I509 Heart failure, unspecified: Secondary | ICD-10-CM | POA: Diagnosis not present

## 2017-11-02 DIAGNOSIS — R001 Bradycardia, unspecified: Secondary | ICD-10-CM | POA: Diagnosis not present

## 2017-11-02 DIAGNOSIS — N189 Chronic kidney disease, unspecified: Secondary | ICD-10-CM | POA: Diagnosis not present

## 2017-11-02 DIAGNOSIS — I2510 Atherosclerotic heart disease of native coronary artery without angina pectoris: Secondary | ICD-10-CM | POA: Diagnosis not present

## 2017-11-02 DIAGNOSIS — G250 Essential tremor: Secondary | ICD-10-CM | POA: Diagnosis not present

## 2017-11-03 DIAGNOSIS — H18899 Other specified disorders of cornea, unspecified eye: Secondary | ICD-10-CM | POA: Diagnosis not present

## 2017-11-03 DIAGNOSIS — H34832 Tributary (branch) retinal vein occlusion, left eye, with macular edema: Secondary | ICD-10-CM | POA: Diagnosis not present

## 2017-11-07 DIAGNOSIS — F0390 Unspecified dementia without behavioral disturbance: Secondary | ICD-10-CM | POA: Diagnosis not present

## 2017-11-07 DIAGNOSIS — I509 Heart failure, unspecified: Secondary | ICD-10-CM | POA: Diagnosis not present

## 2017-11-07 DIAGNOSIS — I2510 Atherosclerotic heart disease of native coronary artery without angina pectoris: Secondary | ICD-10-CM | POA: Diagnosis not present

## 2017-11-07 DIAGNOSIS — N189 Chronic kidney disease, unspecified: Secondary | ICD-10-CM | POA: Diagnosis not present

## 2017-11-07 DIAGNOSIS — G250 Essential tremor: Secondary | ICD-10-CM | POA: Diagnosis not present

## 2017-11-07 DIAGNOSIS — R001 Bradycardia, unspecified: Secondary | ICD-10-CM | POA: Diagnosis not present

## 2017-11-08 ENCOUNTER — Telehealth: Payer: Self-pay | Admitting: Family Medicine

## 2017-11-08 DIAGNOSIS — I2510 Atherosclerotic heart disease of native coronary artery without angina pectoris: Secondary | ICD-10-CM | POA: Diagnosis not present

## 2017-11-08 DIAGNOSIS — I509 Heart failure, unspecified: Secondary | ICD-10-CM | POA: Diagnosis not present

## 2017-11-08 DIAGNOSIS — R001 Bradycardia, unspecified: Secondary | ICD-10-CM | POA: Diagnosis not present

## 2017-11-08 DIAGNOSIS — F0390 Unspecified dementia without behavioral disturbance: Secondary | ICD-10-CM | POA: Diagnosis not present

## 2017-11-08 DIAGNOSIS — N189 Chronic kidney disease, unspecified: Secondary | ICD-10-CM | POA: Diagnosis not present

## 2017-11-08 DIAGNOSIS — G250 Essential tremor: Secondary | ICD-10-CM | POA: Diagnosis not present

## 2017-11-08 NOTE — Telephone Encounter (Signed)
So noted 

## 2017-11-08 NOTE — Telephone Encounter (Signed)
Hospice Home Care nurse made a home visit with patient today, and patient stated she fell Monday w/ no injuries. Hospice Home Care nurse is listed if any further questions. Home Care nurse will be off work tomorrow.

## 2017-11-09 DIAGNOSIS — I2510 Atherosclerotic heart disease of native coronary artery without angina pectoris: Secondary | ICD-10-CM | POA: Diagnosis not present

## 2017-11-09 DIAGNOSIS — I509 Heart failure, unspecified: Secondary | ICD-10-CM | POA: Diagnosis not present

## 2017-11-09 DIAGNOSIS — R001 Bradycardia, unspecified: Secondary | ICD-10-CM | POA: Diagnosis not present

## 2017-11-09 DIAGNOSIS — G250 Essential tremor: Secondary | ICD-10-CM | POA: Diagnosis not present

## 2017-11-09 DIAGNOSIS — F0390 Unspecified dementia without behavioral disturbance: Secondary | ICD-10-CM | POA: Diagnosis not present

## 2017-11-09 DIAGNOSIS — N189 Chronic kidney disease, unspecified: Secondary | ICD-10-CM | POA: Diagnosis not present

## 2017-11-14 DIAGNOSIS — E785 Hyperlipidemia, unspecified: Secondary | ICD-10-CM | POA: Diagnosis not present

## 2017-11-14 DIAGNOSIS — I509 Heart failure, unspecified: Secondary | ICD-10-CM | POA: Diagnosis not present

## 2017-11-14 DIAGNOSIS — R001 Bradycardia, unspecified: Secondary | ICD-10-CM | POA: Diagnosis not present

## 2017-11-14 DIAGNOSIS — F0390 Unspecified dementia without behavioral disturbance: Secondary | ICD-10-CM | POA: Diagnosis not present

## 2017-11-14 DIAGNOSIS — I2781 Cor pulmonale (chronic): Secondary | ICD-10-CM | POA: Diagnosis not present

## 2017-11-14 DIAGNOSIS — N189 Chronic kidney disease, unspecified: Secondary | ICD-10-CM | POA: Diagnosis not present

## 2017-11-14 DIAGNOSIS — G250 Essential tremor: Secondary | ICD-10-CM | POA: Diagnosis not present

## 2017-11-14 DIAGNOSIS — I2510 Atherosclerotic heart disease of native coronary artery without angina pectoris: Secondary | ICD-10-CM | POA: Diagnosis not present

## 2017-11-15 ENCOUNTER — Ambulatory Visit (INDEPENDENT_AMBULATORY_CARE_PROVIDER_SITE_OTHER): Payer: Medicare Other | Admitting: *Deleted

## 2017-11-15 DIAGNOSIS — I2510 Atherosclerotic heart disease of native coronary artery without angina pectoris: Secondary | ICD-10-CM | POA: Diagnosis not present

## 2017-11-15 DIAGNOSIS — G250 Essential tremor: Secondary | ICD-10-CM | POA: Diagnosis not present

## 2017-11-15 DIAGNOSIS — N189 Chronic kidney disease, unspecified: Secondary | ICD-10-CM | POA: Diagnosis not present

## 2017-11-15 DIAGNOSIS — R001 Bradycardia, unspecified: Secondary | ICD-10-CM | POA: Diagnosis not present

## 2017-11-15 DIAGNOSIS — F0390 Unspecified dementia without behavioral disturbance: Secondary | ICD-10-CM | POA: Diagnosis not present

## 2017-11-15 DIAGNOSIS — Z23 Encounter for immunization: Secondary | ICD-10-CM | POA: Diagnosis not present

## 2017-11-15 DIAGNOSIS — I509 Heart failure, unspecified: Secondary | ICD-10-CM | POA: Diagnosis not present

## 2017-11-16 DIAGNOSIS — N189 Chronic kidney disease, unspecified: Secondary | ICD-10-CM | POA: Diagnosis not present

## 2017-11-16 DIAGNOSIS — R001 Bradycardia, unspecified: Secondary | ICD-10-CM | POA: Diagnosis not present

## 2017-11-16 DIAGNOSIS — G250 Essential tremor: Secondary | ICD-10-CM | POA: Diagnosis not present

## 2017-11-16 DIAGNOSIS — F0390 Unspecified dementia without behavioral disturbance: Secondary | ICD-10-CM | POA: Diagnosis not present

## 2017-11-16 DIAGNOSIS — I509 Heart failure, unspecified: Secondary | ICD-10-CM | POA: Diagnosis not present

## 2017-11-16 DIAGNOSIS — I2510 Atherosclerotic heart disease of native coronary artery without angina pectoris: Secondary | ICD-10-CM | POA: Diagnosis not present

## 2017-11-21 DIAGNOSIS — R001 Bradycardia, unspecified: Secondary | ICD-10-CM | POA: Diagnosis not present

## 2017-11-21 DIAGNOSIS — I2510 Atherosclerotic heart disease of native coronary artery without angina pectoris: Secondary | ICD-10-CM | POA: Diagnosis not present

## 2017-11-21 DIAGNOSIS — F0390 Unspecified dementia without behavioral disturbance: Secondary | ICD-10-CM | POA: Diagnosis not present

## 2017-11-21 DIAGNOSIS — I509 Heart failure, unspecified: Secondary | ICD-10-CM | POA: Diagnosis not present

## 2017-11-21 DIAGNOSIS — N189 Chronic kidney disease, unspecified: Secondary | ICD-10-CM | POA: Diagnosis not present

## 2017-11-21 DIAGNOSIS — G250 Essential tremor: Secondary | ICD-10-CM | POA: Diagnosis not present

## 2017-11-22 DIAGNOSIS — G250 Essential tremor: Secondary | ICD-10-CM | POA: Diagnosis not present

## 2017-11-22 DIAGNOSIS — I509 Heart failure, unspecified: Secondary | ICD-10-CM | POA: Diagnosis not present

## 2017-11-22 DIAGNOSIS — F0390 Unspecified dementia without behavioral disturbance: Secondary | ICD-10-CM | POA: Diagnosis not present

## 2017-11-22 DIAGNOSIS — N189 Chronic kidney disease, unspecified: Secondary | ICD-10-CM | POA: Diagnosis not present

## 2017-11-22 DIAGNOSIS — R001 Bradycardia, unspecified: Secondary | ICD-10-CM | POA: Diagnosis not present

## 2017-11-22 DIAGNOSIS — I2510 Atherosclerotic heart disease of native coronary artery without angina pectoris: Secondary | ICD-10-CM | POA: Diagnosis not present

## 2017-11-23 ENCOUNTER — Other Ambulatory Visit: Payer: Self-pay | Admitting: Family Medicine

## 2017-11-23 DIAGNOSIS — I2510 Atherosclerotic heart disease of native coronary artery without angina pectoris: Secondary | ICD-10-CM | POA: Diagnosis not present

## 2017-11-23 DIAGNOSIS — N189 Chronic kidney disease, unspecified: Secondary | ICD-10-CM | POA: Diagnosis not present

## 2017-11-23 DIAGNOSIS — I509 Heart failure, unspecified: Secondary | ICD-10-CM | POA: Diagnosis not present

## 2017-11-23 DIAGNOSIS — G250 Essential tremor: Secondary | ICD-10-CM | POA: Diagnosis not present

## 2017-11-23 DIAGNOSIS — F0390 Unspecified dementia without behavioral disturbance: Secondary | ICD-10-CM | POA: Diagnosis not present

## 2017-11-23 DIAGNOSIS — R001 Bradycardia, unspecified: Secondary | ICD-10-CM | POA: Diagnosis not present

## 2017-11-28 DIAGNOSIS — R001 Bradycardia, unspecified: Secondary | ICD-10-CM | POA: Diagnosis not present

## 2017-11-28 DIAGNOSIS — I2510 Atherosclerotic heart disease of native coronary artery without angina pectoris: Secondary | ICD-10-CM | POA: Diagnosis not present

## 2017-11-28 DIAGNOSIS — N189 Chronic kidney disease, unspecified: Secondary | ICD-10-CM | POA: Diagnosis not present

## 2017-11-28 DIAGNOSIS — G250 Essential tremor: Secondary | ICD-10-CM | POA: Diagnosis not present

## 2017-11-28 DIAGNOSIS — F0390 Unspecified dementia without behavioral disturbance: Secondary | ICD-10-CM | POA: Diagnosis not present

## 2017-11-28 DIAGNOSIS — I509 Heart failure, unspecified: Secondary | ICD-10-CM | POA: Diagnosis not present

## 2017-11-29 DIAGNOSIS — R001 Bradycardia, unspecified: Secondary | ICD-10-CM | POA: Diagnosis not present

## 2017-11-29 DIAGNOSIS — G250 Essential tremor: Secondary | ICD-10-CM | POA: Diagnosis not present

## 2017-11-29 DIAGNOSIS — N189 Chronic kidney disease, unspecified: Secondary | ICD-10-CM | POA: Diagnosis not present

## 2017-11-29 DIAGNOSIS — I2510 Atherosclerotic heart disease of native coronary artery without angina pectoris: Secondary | ICD-10-CM | POA: Diagnosis not present

## 2017-11-29 DIAGNOSIS — F0390 Unspecified dementia without behavioral disturbance: Secondary | ICD-10-CM | POA: Diagnosis not present

## 2017-11-29 DIAGNOSIS — I509 Heart failure, unspecified: Secondary | ICD-10-CM | POA: Diagnosis not present

## 2017-11-30 DIAGNOSIS — G250 Essential tremor: Secondary | ICD-10-CM | POA: Diagnosis not present

## 2017-11-30 DIAGNOSIS — I509 Heart failure, unspecified: Secondary | ICD-10-CM | POA: Diagnosis not present

## 2017-11-30 DIAGNOSIS — F0390 Unspecified dementia without behavioral disturbance: Secondary | ICD-10-CM | POA: Diagnosis not present

## 2017-11-30 DIAGNOSIS — R001 Bradycardia, unspecified: Secondary | ICD-10-CM | POA: Diagnosis not present

## 2017-11-30 DIAGNOSIS — N189 Chronic kidney disease, unspecified: Secondary | ICD-10-CM | POA: Diagnosis not present

## 2017-11-30 DIAGNOSIS — I2510 Atherosclerotic heart disease of native coronary artery without angina pectoris: Secondary | ICD-10-CM | POA: Diagnosis not present

## 2017-12-04 ENCOUNTER — Other Ambulatory Visit: Payer: Self-pay | Admitting: Family Medicine

## 2017-12-05 DIAGNOSIS — N189 Chronic kidney disease, unspecified: Secondary | ICD-10-CM | POA: Diagnosis not present

## 2017-12-05 DIAGNOSIS — I2510 Atherosclerotic heart disease of native coronary artery without angina pectoris: Secondary | ICD-10-CM | POA: Diagnosis not present

## 2017-12-05 DIAGNOSIS — I509 Heart failure, unspecified: Secondary | ICD-10-CM | POA: Diagnosis not present

## 2017-12-05 DIAGNOSIS — G250 Essential tremor: Secondary | ICD-10-CM | POA: Diagnosis not present

## 2017-12-05 DIAGNOSIS — R001 Bradycardia, unspecified: Secondary | ICD-10-CM | POA: Diagnosis not present

## 2017-12-05 DIAGNOSIS — F0390 Unspecified dementia without behavioral disturbance: Secondary | ICD-10-CM | POA: Diagnosis not present

## 2017-12-06 DIAGNOSIS — F0390 Unspecified dementia without behavioral disturbance: Secondary | ICD-10-CM | POA: Diagnosis not present

## 2017-12-06 DIAGNOSIS — I2510 Atherosclerotic heart disease of native coronary artery without angina pectoris: Secondary | ICD-10-CM | POA: Diagnosis not present

## 2017-12-06 DIAGNOSIS — G250 Essential tremor: Secondary | ICD-10-CM | POA: Diagnosis not present

## 2017-12-06 DIAGNOSIS — I509 Heart failure, unspecified: Secondary | ICD-10-CM | POA: Diagnosis not present

## 2017-12-06 DIAGNOSIS — R001 Bradycardia, unspecified: Secondary | ICD-10-CM | POA: Diagnosis not present

## 2017-12-06 DIAGNOSIS — N189 Chronic kidney disease, unspecified: Secondary | ICD-10-CM | POA: Diagnosis not present

## 2017-12-07 DIAGNOSIS — G250 Essential tremor: Secondary | ICD-10-CM | POA: Diagnosis not present

## 2017-12-07 DIAGNOSIS — I2510 Atherosclerotic heart disease of native coronary artery without angina pectoris: Secondary | ICD-10-CM | POA: Diagnosis not present

## 2017-12-07 DIAGNOSIS — F0390 Unspecified dementia without behavioral disturbance: Secondary | ICD-10-CM | POA: Diagnosis not present

## 2017-12-07 DIAGNOSIS — I509 Heart failure, unspecified: Secondary | ICD-10-CM | POA: Diagnosis not present

## 2017-12-07 DIAGNOSIS — R001 Bradycardia, unspecified: Secondary | ICD-10-CM | POA: Diagnosis not present

## 2017-12-07 DIAGNOSIS — N189 Chronic kidney disease, unspecified: Secondary | ICD-10-CM | POA: Diagnosis not present

## 2017-12-12 DIAGNOSIS — I2510 Atherosclerotic heart disease of native coronary artery without angina pectoris: Secondary | ICD-10-CM | POA: Diagnosis not present

## 2017-12-12 DIAGNOSIS — G250 Essential tremor: Secondary | ICD-10-CM | POA: Diagnosis not present

## 2017-12-12 DIAGNOSIS — R001 Bradycardia, unspecified: Secondary | ICD-10-CM | POA: Diagnosis not present

## 2017-12-12 DIAGNOSIS — F0390 Unspecified dementia without behavioral disturbance: Secondary | ICD-10-CM | POA: Diagnosis not present

## 2017-12-12 DIAGNOSIS — I509 Heart failure, unspecified: Secondary | ICD-10-CM | POA: Diagnosis not present

## 2017-12-12 DIAGNOSIS — N189 Chronic kidney disease, unspecified: Secondary | ICD-10-CM | POA: Diagnosis not present

## 2017-12-13 DIAGNOSIS — I509 Heart failure, unspecified: Secondary | ICD-10-CM | POA: Diagnosis not present

## 2017-12-13 DIAGNOSIS — G250 Essential tremor: Secondary | ICD-10-CM | POA: Diagnosis not present

## 2017-12-13 DIAGNOSIS — R001 Bradycardia, unspecified: Secondary | ICD-10-CM | POA: Diagnosis not present

## 2017-12-13 DIAGNOSIS — N189 Chronic kidney disease, unspecified: Secondary | ICD-10-CM | POA: Diagnosis not present

## 2017-12-13 DIAGNOSIS — F0390 Unspecified dementia without behavioral disturbance: Secondary | ICD-10-CM | POA: Diagnosis not present

## 2017-12-13 DIAGNOSIS — I2510 Atherosclerotic heart disease of native coronary artery without angina pectoris: Secondary | ICD-10-CM | POA: Diagnosis not present

## 2017-12-15 DIAGNOSIS — I509 Heart failure, unspecified: Secondary | ICD-10-CM | POA: Diagnosis not present

## 2017-12-15 DIAGNOSIS — G250 Essential tremor: Secondary | ICD-10-CM | POA: Diagnosis not present

## 2017-12-15 DIAGNOSIS — N189 Chronic kidney disease, unspecified: Secondary | ICD-10-CM | POA: Diagnosis not present

## 2017-12-15 DIAGNOSIS — E785 Hyperlipidemia, unspecified: Secondary | ICD-10-CM | POA: Diagnosis not present

## 2017-12-15 DIAGNOSIS — I2781 Cor pulmonale (chronic): Secondary | ICD-10-CM | POA: Diagnosis not present

## 2017-12-15 DIAGNOSIS — I2510 Atherosclerotic heart disease of native coronary artery without angina pectoris: Secondary | ICD-10-CM | POA: Diagnosis not present

## 2017-12-15 DIAGNOSIS — F0390 Unspecified dementia without behavioral disturbance: Secondary | ICD-10-CM | POA: Diagnosis not present

## 2017-12-15 DIAGNOSIS — R001 Bradycardia, unspecified: Secondary | ICD-10-CM | POA: Diagnosis not present

## 2017-12-19 DIAGNOSIS — R001 Bradycardia, unspecified: Secondary | ICD-10-CM | POA: Diagnosis not present

## 2017-12-19 DIAGNOSIS — I2510 Atherosclerotic heart disease of native coronary artery without angina pectoris: Secondary | ICD-10-CM | POA: Diagnosis not present

## 2017-12-19 DIAGNOSIS — N189 Chronic kidney disease, unspecified: Secondary | ICD-10-CM | POA: Diagnosis not present

## 2017-12-19 DIAGNOSIS — I509 Heart failure, unspecified: Secondary | ICD-10-CM | POA: Diagnosis not present

## 2017-12-19 DIAGNOSIS — G250 Essential tremor: Secondary | ICD-10-CM | POA: Diagnosis not present

## 2017-12-19 DIAGNOSIS — F0390 Unspecified dementia without behavioral disturbance: Secondary | ICD-10-CM | POA: Diagnosis not present

## 2017-12-21 ENCOUNTER — Other Ambulatory Visit: Payer: Self-pay | Admitting: Family Medicine

## 2017-12-21 DIAGNOSIS — G250 Essential tremor: Secondary | ICD-10-CM | POA: Diagnosis not present

## 2017-12-21 DIAGNOSIS — N189 Chronic kidney disease, unspecified: Secondary | ICD-10-CM | POA: Diagnosis not present

## 2017-12-21 DIAGNOSIS — F0390 Unspecified dementia without behavioral disturbance: Secondary | ICD-10-CM | POA: Diagnosis not present

## 2017-12-21 DIAGNOSIS — R001 Bradycardia, unspecified: Secondary | ICD-10-CM | POA: Diagnosis not present

## 2017-12-21 DIAGNOSIS — I509 Heart failure, unspecified: Secondary | ICD-10-CM | POA: Diagnosis not present

## 2017-12-21 DIAGNOSIS — I2510 Atherosclerotic heart disease of native coronary artery without angina pectoris: Secondary | ICD-10-CM | POA: Diagnosis not present

## 2017-12-26 ENCOUNTER — Other Ambulatory Visit: Payer: Self-pay | Admitting: Family Medicine

## 2017-12-26 DIAGNOSIS — N189 Chronic kidney disease, unspecified: Secondary | ICD-10-CM | POA: Diagnosis not present

## 2017-12-26 DIAGNOSIS — F0390 Unspecified dementia without behavioral disturbance: Secondary | ICD-10-CM | POA: Diagnosis not present

## 2017-12-26 DIAGNOSIS — I509 Heart failure, unspecified: Secondary | ICD-10-CM | POA: Diagnosis not present

## 2017-12-26 DIAGNOSIS — G250 Essential tremor: Secondary | ICD-10-CM | POA: Diagnosis not present

## 2017-12-26 DIAGNOSIS — R001 Bradycardia, unspecified: Secondary | ICD-10-CM | POA: Diagnosis not present

## 2017-12-26 DIAGNOSIS — I2510 Atherosclerotic heart disease of native coronary artery without angina pectoris: Secondary | ICD-10-CM | POA: Diagnosis not present

## 2017-12-27 NOTE — Telephone Encounter (Signed)
I would recommend informing the pharmacy that the patient no longer is on this medicine to our knowledge

## 2017-12-28 ENCOUNTER — Other Ambulatory Visit: Payer: Self-pay | Admitting: Family Medicine

## 2017-12-28 DIAGNOSIS — R001 Bradycardia, unspecified: Secondary | ICD-10-CM | POA: Diagnosis not present

## 2017-12-28 DIAGNOSIS — N189 Chronic kidney disease, unspecified: Secondary | ICD-10-CM | POA: Diagnosis not present

## 2017-12-28 DIAGNOSIS — I2510 Atherosclerotic heart disease of native coronary artery without angina pectoris: Secondary | ICD-10-CM | POA: Diagnosis not present

## 2017-12-28 DIAGNOSIS — F0390 Unspecified dementia without behavioral disturbance: Secondary | ICD-10-CM | POA: Diagnosis not present

## 2017-12-28 DIAGNOSIS — G250 Essential tremor: Secondary | ICD-10-CM | POA: Diagnosis not present

## 2017-12-28 DIAGNOSIS — I509 Heart failure, unspecified: Secondary | ICD-10-CM | POA: Diagnosis not present

## 2018-01-02 DIAGNOSIS — I509 Heart failure, unspecified: Secondary | ICD-10-CM | POA: Diagnosis not present

## 2018-01-02 DIAGNOSIS — N189 Chronic kidney disease, unspecified: Secondary | ICD-10-CM | POA: Diagnosis not present

## 2018-01-02 DIAGNOSIS — F0390 Unspecified dementia without behavioral disturbance: Secondary | ICD-10-CM | POA: Diagnosis not present

## 2018-01-02 DIAGNOSIS — R001 Bradycardia, unspecified: Secondary | ICD-10-CM | POA: Diagnosis not present

## 2018-01-02 DIAGNOSIS — I2510 Atherosclerotic heart disease of native coronary artery without angina pectoris: Secondary | ICD-10-CM | POA: Diagnosis not present

## 2018-01-02 DIAGNOSIS — G250 Essential tremor: Secondary | ICD-10-CM | POA: Diagnosis not present

## 2018-01-04 ENCOUNTER — Telehealth: Payer: Self-pay | Admitting: Family Medicine

## 2018-01-04 DIAGNOSIS — I509 Heart failure, unspecified: Secondary | ICD-10-CM | POA: Diagnosis not present

## 2018-01-04 DIAGNOSIS — R001 Bradycardia, unspecified: Secondary | ICD-10-CM | POA: Diagnosis not present

## 2018-01-04 DIAGNOSIS — G250 Essential tremor: Secondary | ICD-10-CM | POA: Diagnosis not present

## 2018-01-04 DIAGNOSIS — I2510 Atherosclerotic heart disease of native coronary artery without angina pectoris: Secondary | ICD-10-CM | POA: Diagnosis not present

## 2018-01-04 DIAGNOSIS — F0390 Unspecified dementia without behavioral disturbance: Secondary | ICD-10-CM | POA: Diagnosis not present

## 2018-01-04 DIAGNOSIS — N189 Chronic kidney disease, unspecified: Secondary | ICD-10-CM | POA: Diagnosis not present

## 2018-01-04 NOTE — Telephone Encounter (Signed)
Please advise 

## 2018-01-04 NOTE — Telephone Encounter (Signed)
Pts granddaughter Kennyth Arnold calling requesting Dr. Lorin Picket write an order for pt stating she is on continuous oxygen. Her oxygen concentrator is not working and [employer] will not replace it for 5 days since they do not have an order stating its continuous. They have one right now from [place] but they need to get the other one replaced. Please fax order to [phone].

## 2018-01-05 NOTE — Telephone Encounter (Signed)
Contacted grand daughter Kennyth Arnold and informed her that we were going to fax over the order for continuous oxygen. Pt grand daughter stated to not worry about it because the machines are on back order. Pt grand daughter states they are going to use [employer].

## 2018-01-05 NOTE — Telephone Encounter (Signed)
Please go ahead and give the order for continuous oxygen, let us get it signed today and sent to them today thank you

## 2018-01-09 ENCOUNTER — Telehealth: Payer: Self-pay | Admitting: Family Medicine

## 2018-01-09 DIAGNOSIS — N189 Chronic kidney disease, unspecified: Secondary | ICD-10-CM | POA: Diagnosis not present

## 2018-01-09 DIAGNOSIS — I2510 Atherosclerotic heart disease of native coronary artery without angina pectoris: Secondary | ICD-10-CM | POA: Diagnosis not present

## 2018-01-09 DIAGNOSIS — F0390 Unspecified dementia without behavioral disturbance: Secondary | ICD-10-CM | POA: Diagnosis not present

## 2018-01-09 DIAGNOSIS — I509 Heart failure, unspecified: Secondary | ICD-10-CM | POA: Diagnosis not present

## 2018-01-09 DIAGNOSIS — R001 Bradycardia, unspecified: Secondary | ICD-10-CM | POA: Diagnosis not present

## 2018-01-09 DIAGNOSIS — G250 Essential tremor: Secondary | ICD-10-CM | POA: Diagnosis not present

## 2018-01-09 NOTE — Telephone Encounter (Signed)
Daughter Pam dropped off a form for [employer] so that the power will not be turned off.  Patient has to get a new form signed every year.

## 2018-01-10 NOTE — Telephone Encounter (Signed)
This form was completed as requested 

## 2018-01-14 DIAGNOSIS — I509 Heart failure, unspecified: Secondary | ICD-10-CM | POA: Diagnosis not present

## 2018-01-14 DIAGNOSIS — F0390 Unspecified dementia without behavioral disturbance: Secondary | ICD-10-CM | POA: Diagnosis not present

## 2018-01-14 DIAGNOSIS — R001 Bradycardia, unspecified: Secondary | ICD-10-CM | POA: Diagnosis not present

## 2018-01-14 DIAGNOSIS — I2510 Atherosclerotic heart disease of native coronary artery without angina pectoris: Secondary | ICD-10-CM | POA: Diagnosis not present

## 2018-01-14 DIAGNOSIS — G250 Essential tremor: Secondary | ICD-10-CM | POA: Diagnosis not present

## 2018-01-14 DIAGNOSIS — E785 Hyperlipidemia, unspecified: Secondary | ICD-10-CM | POA: Diagnosis not present

## 2018-01-14 DIAGNOSIS — I2781 Cor pulmonale (chronic): Secondary | ICD-10-CM | POA: Diagnosis not present

## 2018-01-14 DIAGNOSIS — N189 Chronic kidney disease, unspecified: Secondary | ICD-10-CM | POA: Diagnosis not present

## 2018-01-15 NOTE — Telephone Encounter (Signed)
Form was mailed and faxed on 01/12/2018.

## 2018-01-16 DIAGNOSIS — F0390 Unspecified dementia without behavioral disturbance: Secondary | ICD-10-CM | POA: Diagnosis not present

## 2018-01-16 DIAGNOSIS — I509 Heart failure, unspecified: Secondary | ICD-10-CM | POA: Diagnosis not present

## 2018-01-16 DIAGNOSIS — R001 Bradycardia, unspecified: Secondary | ICD-10-CM | POA: Diagnosis not present

## 2018-01-16 DIAGNOSIS — N189 Chronic kidney disease, unspecified: Secondary | ICD-10-CM | POA: Diagnosis not present

## 2018-01-16 DIAGNOSIS — I2510 Atherosclerotic heart disease of native coronary artery without angina pectoris: Secondary | ICD-10-CM | POA: Diagnosis not present

## 2018-01-16 DIAGNOSIS — G250 Essential tremor: Secondary | ICD-10-CM | POA: Diagnosis not present

## 2018-01-18 DIAGNOSIS — I509 Heart failure, unspecified: Secondary | ICD-10-CM | POA: Diagnosis not present

## 2018-01-18 DIAGNOSIS — F0390 Unspecified dementia without behavioral disturbance: Secondary | ICD-10-CM | POA: Diagnosis not present

## 2018-01-18 DIAGNOSIS — G250 Essential tremor: Secondary | ICD-10-CM | POA: Diagnosis not present

## 2018-01-18 DIAGNOSIS — R001 Bradycardia, unspecified: Secondary | ICD-10-CM | POA: Diagnosis not present

## 2018-01-18 DIAGNOSIS — N189 Chronic kidney disease, unspecified: Secondary | ICD-10-CM | POA: Diagnosis not present

## 2018-01-18 DIAGNOSIS — I2510 Atherosclerotic heart disease of native coronary artery without angina pectoris: Secondary | ICD-10-CM | POA: Diagnosis not present

## 2018-01-19 ENCOUNTER — Other Ambulatory Visit: Payer: Self-pay | Admitting: Family Medicine

## 2018-01-23 ENCOUNTER — Telehealth: Payer: Self-pay | Admitting: Family Medicine

## 2018-01-23 ENCOUNTER — Encounter: Payer: Self-pay | Admitting: Family Medicine

## 2018-01-23 ENCOUNTER — Ambulatory Visit (INDEPENDENT_AMBULATORY_CARE_PROVIDER_SITE_OTHER): Payer: Medicare Other | Admitting: Family Medicine

## 2018-01-23 VITALS — BP 122/82 | Temp 98.7°F | Ht 63.0 in

## 2018-01-23 DIAGNOSIS — R05 Cough: Secondary | ICD-10-CM

## 2018-01-23 DIAGNOSIS — R0981 Nasal congestion: Secondary | ICD-10-CM | POA: Diagnosis not present

## 2018-01-23 DIAGNOSIS — R001 Bradycardia, unspecified: Secondary | ICD-10-CM | POA: Diagnosis not present

## 2018-01-23 DIAGNOSIS — J441 Chronic obstructive pulmonary disease with (acute) exacerbation: Secondary | ICD-10-CM | POA: Diagnosis not present

## 2018-01-23 DIAGNOSIS — N189 Chronic kidney disease, unspecified: Secondary | ICD-10-CM | POA: Diagnosis not present

## 2018-01-23 DIAGNOSIS — F0390 Unspecified dementia without behavioral disturbance: Secondary | ICD-10-CM | POA: Diagnosis not present

## 2018-01-23 DIAGNOSIS — G250 Essential tremor: Secondary | ICD-10-CM | POA: Diagnosis not present

## 2018-01-23 DIAGNOSIS — I2510 Atherosclerotic heart disease of native coronary artery without angina pectoris: Secondary | ICD-10-CM | POA: Diagnosis not present

## 2018-01-23 DIAGNOSIS — I509 Heart failure, unspecified: Secondary | ICD-10-CM | POA: Diagnosis not present

## 2018-01-23 MED ORDER — LEVOFLOXACIN 500 MG PO TABS: 500.0000 mg | ORAL_TABLET | Freq: Every day | ORAL | 0 refills | Status: AC

## 2018-01-23 MED ORDER — PREDNISONE 20 MG PO TABS: ORAL_TABLET | ORAL | 0 refills | Status: AC

## 2018-01-23 NOTE — Telephone Encounter (Signed)
Pt started wheezing last night, cough, sorethroat, Pt's granddaughter requesting medication to be sent in due to unable to bring pt into office today for an appt.   Pharmacy:  [facility] - [place], [place] - [address]  Mervin Hack on [address]

## 2018-01-23 NOTE — Telephone Encounter (Signed)
Patient called back and scheduled an appointment for 01/23/18 at 4pm with Dr Brett Canales

## 2018-01-23 NOTE — Progress Notes (Signed)
   Subjective:    Patient ID: [name], female    DOB: 03-31-1935, 82 y.o.   MRN: [id number]  Cough  This is a new problem. The current episode started yesterday. Associated symptoms include nasal congestion and wheezing.     Started two days ago   Felt [employer] trouble wheezing   Wondered whether low gr fever   coughin bad, productive at times, perhaps low-grade fever.  Having to use albuterol more now.  Uses oxygen chronically Review of Systems  Respiratory: Positive for cough and wheezing.        Objective:   Physical Exam  Alert no acute distress talkative oxygen present positive nasal congestion pharynx normal intermittent wheezy cough no tachypnea no inspiratory crackles heart regular rate and rhythm      Assessment & Plan:  Impression exacerbation of COPD discussed plan prednisone taper antibiotics prescribed symptom care discussed albuterol encouraged/warning signs discussed to hospital if worsens

## 2018-01-25 DIAGNOSIS — I2510 Atherosclerotic heart disease of native coronary artery without angina pectoris: Secondary | ICD-10-CM | POA: Diagnosis not present

## 2018-01-25 DIAGNOSIS — G250 Essential tremor: Secondary | ICD-10-CM | POA: Diagnosis not present

## 2018-01-25 DIAGNOSIS — F0390 Unspecified dementia without behavioral disturbance: Secondary | ICD-10-CM | POA: Diagnosis not present

## 2018-01-25 DIAGNOSIS — R001 Bradycardia, unspecified: Secondary | ICD-10-CM | POA: Diagnosis not present

## 2018-01-25 DIAGNOSIS — N189 Chronic kidney disease, unspecified: Secondary | ICD-10-CM | POA: Diagnosis not present

## 2018-01-25 DIAGNOSIS — I509 Heart failure, unspecified: Secondary | ICD-10-CM | POA: Diagnosis not present

## 2018-01-26 ENCOUNTER — Telehealth: Payer: Self-pay | Admitting: Family Medicine

## 2018-01-26 MED ORDER — CEFDINIR 300 MG PO CAPS: 300.0000 mg | ORAL_CAPSULE | Freq: Two times a day (BID) | ORAL | 0 refills | Status: DC

## 2018-01-26 NOTE — Addendum Note (Signed)
Addended by: Margaretha Sheffield on: 01/26/2018 04:27 PM   Modules accepted: Orders

## 2018-01-26 NOTE — Telephone Encounter (Signed)
Sounds fine 

## 2018-01-26 NOTE — Telephone Encounter (Signed)
Prescription sent electronically to pharmacy. Spoke with granddaughter(DPR) who stated the patient is no worse and they are going to keep her on the levaquin till done and will have the [facility] as back up if needed and will have her rechecked if worse

## 2018-01-26 NOTE — Telephone Encounter (Signed)
Pt contacted office. Pt was in office on 01/23/18 and was giving Levaquin 500 mg once daily for 10 days and Prednisone Taper. Pt states she is not improving. Pt stated that she was still having same symptoms, cant eat, sleep, is weak and her throat is dry. Pt stated that she had taken all her medication. Verified with husband. Husband states that she has not taking all medication. Spoke with provider and provider stated that he will look over medication and see what can be done. Informed husband that provider will look over med and we would call back this afternoon. Pt husband verbalized understanding.

## 2018-01-26 NOTE — Telephone Encounter (Signed)
So Levaquin is actually a very strong antibiotic.  It would be my recommendation for the patient to continue this for 3 more days then may utilize Omnicef 300 mg twice daily for 7 days

## 2018-01-29 ENCOUNTER — Other Ambulatory Visit: Payer: Self-pay | Admitting: Family Medicine

## 2018-01-30 ENCOUNTER — Telehealth: Payer: Self-pay

## 2018-01-30 DIAGNOSIS — R001 Bradycardia, unspecified: Secondary | ICD-10-CM | POA: Diagnosis not present

## 2018-01-30 DIAGNOSIS — G250 Essential tremor: Secondary | ICD-10-CM | POA: Diagnosis not present

## 2018-01-30 DIAGNOSIS — F0390 Unspecified dementia without behavioral disturbance: Secondary | ICD-10-CM | POA: Diagnosis not present

## 2018-01-30 DIAGNOSIS — I509 Heart failure, unspecified: Secondary | ICD-10-CM | POA: Diagnosis not present

## 2018-01-30 DIAGNOSIS — N189 Chronic kidney disease, unspecified: Secondary | ICD-10-CM | POA: Diagnosis not present

## 2018-01-30 DIAGNOSIS — I2510 Atherosclerotic heart disease of native coronary artery without angina pectoris: Secondary | ICD-10-CM | POA: Diagnosis not present

## 2018-01-30 NOTE — Telephone Encounter (Signed)
Dr.Karb called and wanted to know if you would be willing to start pt on Gabepentin 300 mg start with one per day for two days,then bid there after # 60 with prn refills.He states her kidney function is ok.He states the pt is on Hospice and is having some post herpic headache. He is asking that we do this as they are seeing her at [facility] for CHF and it will not be covered if he sends this in.(Dr.Karb # [phone]).Please advise.

## 2018-01-31 DIAGNOSIS — I2510 Atherosclerotic heart disease of native coronary artery without angina pectoris: Secondary | ICD-10-CM | POA: Diagnosis not present

## 2018-01-31 DIAGNOSIS — G250 Essential tremor: Secondary | ICD-10-CM | POA: Diagnosis not present

## 2018-01-31 DIAGNOSIS — F0390 Unspecified dementia without behavioral disturbance: Secondary | ICD-10-CM | POA: Diagnosis not present

## 2018-01-31 DIAGNOSIS — N189 Chronic kidney disease, unspecified: Secondary | ICD-10-CM | POA: Diagnosis not present

## 2018-01-31 DIAGNOSIS — R001 Bradycardia, unspecified: Secondary | ICD-10-CM | POA: Diagnosis not present

## 2018-01-31 DIAGNOSIS — I509 Heart failure, unspecified: Secondary | ICD-10-CM | POA: Diagnosis not present

## 2018-02-01 ENCOUNTER — Other Ambulatory Visit: Payer: Self-pay | Admitting: Family Medicine

## 2018-02-01 DIAGNOSIS — F0390 Unspecified dementia without behavioral disturbance: Secondary | ICD-10-CM | POA: Diagnosis not present

## 2018-02-01 DIAGNOSIS — N189 Chronic kidney disease, unspecified: Secondary | ICD-10-CM | POA: Diagnosis not present

## 2018-02-01 DIAGNOSIS — G250 Essential tremor: Secondary | ICD-10-CM | POA: Diagnosis not present

## 2018-02-01 DIAGNOSIS — R001 Bradycardia, unspecified: Secondary | ICD-10-CM | POA: Diagnosis not present

## 2018-02-01 DIAGNOSIS — I2510 Atherosclerotic heart disease of native coronary artery without angina pectoris: Secondary | ICD-10-CM | POA: Diagnosis not present

## 2018-02-01 DIAGNOSIS — I509 Heart failure, unspecified: Secondary | ICD-10-CM | POA: Diagnosis not present

## 2018-02-01 MED ORDER — GABAPENTIN 100 MG PO CAPS: ORAL_CAPSULE | ORAL | 5 refills | Status: DC

## 2018-02-01 NOTE — Telephone Encounter (Signed)
Spoke with pt grand daughter. Grand daughter she was not there when Dr. Cleone Slim came by. Grand daughter states that if provider thinks this med is OK then they are willing to try it. Sent in Gabapentin 100mg  twice daily. Informed grand daughter that if patient becomes drowsy or affects mentation then to stop medication and let us know. Grand daughter verbalized understanding.

## 2018-02-01 NOTE — Telephone Encounter (Signed)
Please inform the husband that the doctor with hospice recommended gabapentin to try to help her with her neuropathy pain. (The hospice specialist recommended 300 mg but I believe that that is a big dose for this patient)  If the family is interested in starting that to see if it will help her from her neuropathy pain then we can go with 100 mg gabapentin 1 twice daily.  If the medication makes her feel drowsy or affects her mentation then stop the medicine #60 with 5 refills

## 2018-02-06 DIAGNOSIS — F0390 Unspecified dementia without behavioral disturbance: Secondary | ICD-10-CM | POA: Diagnosis not present

## 2018-02-06 DIAGNOSIS — I509 Heart failure, unspecified: Secondary | ICD-10-CM | POA: Diagnosis not present

## 2018-02-06 DIAGNOSIS — G250 Essential tremor: Secondary | ICD-10-CM | POA: Diagnosis not present

## 2018-02-06 DIAGNOSIS — N189 Chronic kidney disease, unspecified: Secondary | ICD-10-CM | POA: Diagnosis not present

## 2018-02-06 DIAGNOSIS — R001 Bradycardia, unspecified: Secondary | ICD-10-CM | POA: Diagnosis not present

## 2018-02-06 DIAGNOSIS — I2510 Atherosclerotic heart disease of native coronary artery without angina pectoris: Secondary | ICD-10-CM | POA: Diagnosis not present

## 2018-02-08 DIAGNOSIS — G250 Essential tremor: Secondary | ICD-10-CM | POA: Diagnosis not present

## 2018-02-08 DIAGNOSIS — N189 Chronic kidney disease, unspecified: Secondary | ICD-10-CM | POA: Diagnosis not present

## 2018-02-08 DIAGNOSIS — I509 Heart failure, unspecified: Secondary | ICD-10-CM | POA: Diagnosis not present

## 2018-02-08 DIAGNOSIS — F0390 Unspecified dementia without behavioral disturbance: Secondary | ICD-10-CM | POA: Diagnosis not present

## 2018-02-08 DIAGNOSIS — R001 Bradycardia, unspecified: Secondary | ICD-10-CM | POA: Diagnosis not present

## 2018-02-08 DIAGNOSIS — I2510 Atherosclerotic heart disease of native coronary artery without angina pectoris: Secondary | ICD-10-CM | POA: Diagnosis not present

## 2018-02-09 ENCOUNTER — Other Ambulatory Visit: Payer: Self-pay | Admitting: Family Medicine

## 2018-02-09 NOTE — Telephone Encounter (Signed)
May have 3 refills 

## 2018-02-12 ENCOUNTER — Telehealth: Payer: Self-pay | Admitting: Family Medicine

## 2018-02-12 DIAGNOSIS — N189 Chronic kidney disease, unspecified: Secondary | ICD-10-CM | POA: Diagnosis not present

## 2018-02-12 DIAGNOSIS — I509 Heart failure, unspecified: Secondary | ICD-10-CM | POA: Diagnosis not present

## 2018-02-12 DIAGNOSIS — I2510 Atherosclerotic heart disease of native coronary artery without angina pectoris: Secondary | ICD-10-CM | POA: Diagnosis not present

## 2018-02-12 DIAGNOSIS — R001 Bradycardia, unspecified: Secondary | ICD-10-CM | POA: Diagnosis not present

## 2018-02-12 DIAGNOSIS — F0390 Unspecified dementia without behavioral disturbance: Secondary | ICD-10-CM | POA: Diagnosis not present

## 2018-02-12 DIAGNOSIS — G250 Essential tremor: Secondary | ICD-10-CM | POA: Diagnosis not present

## 2018-02-12 NOTE — Telephone Encounter (Signed)
I agree go ahead and stop gabapentin Keep [name] updated if further falling

## 2018-02-12 NOTE — Telephone Encounter (Signed)
Granddaughter Misty Stanley aware of all. I also stopped on her medication list.

## 2018-02-12 NOTE — Telephone Encounter (Signed)
Larey Seat multiple times over weekend, no injuries, Granddaughter would like to know if its okay to stop taking Gabapentin.

## 2018-02-14 DIAGNOSIS — R001 Bradycardia, unspecified: Secondary | ICD-10-CM | POA: Diagnosis not present

## 2018-02-14 DIAGNOSIS — F0390 Unspecified dementia without behavioral disturbance: Secondary | ICD-10-CM | POA: Diagnosis not present

## 2018-02-14 DIAGNOSIS — G250 Essential tremor: Secondary | ICD-10-CM | POA: Diagnosis not present

## 2018-02-14 DIAGNOSIS — I2781 Cor pulmonale (chronic): Secondary | ICD-10-CM | POA: Diagnosis not present

## 2018-02-14 DIAGNOSIS — I509 Heart failure, unspecified: Secondary | ICD-10-CM | POA: Diagnosis not present

## 2018-02-14 DIAGNOSIS — E785 Hyperlipidemia, unspecified: Secondary | ICD-10-CM | POA: Diagnosis not present

## 2018-02-14 DIAGNOSIS — I2510 Atherosclerotic heart disease of native coronary artery without angina pectoris: Secondary | ICD-10-CM | POA: Diagnosis not present

## 2018-02-14 DIAGNOSIS — N189 Chronic kidney disease, unspecified: Secondary | ICD-10-CM | POA: Diagnosis not present

## 2018-02-20 DIAGNOSIS — G250 Essential tremor: Secondary | ICD-10-CM | POA: Diagnosis not present

## 2018-02-20 DIAGNOSIS — F0390 Unspecified dementia without behavioral disturbance: Secondary | ICD-10-CM | POA: Diagnosis not present

## 2018-02-20 DIAGNOSIS — I2510 Atherosclerotic heart disease of native coronary artery without angina pectoris: Secondary | ICD-10-CM | POA: Diagnosis not present

## 2018-02-20 DIAGNOSIS — I509 Heart failure, unspecified: Secondary | ICD-10-CM | POA: Diagnosis not present

## 2018-02-20 DIAGNOSIS — N189 Chronic kidney disease, unspecified: Secondary | ICD-10-CM | POA: Diagnosis not present

## 2018-02-20 DIAGNOSIS — R001 Bradycardia, unspecified: Secondary | ICD-10-CM | POA: Diagnosis not present

## 2018-02-21 DIAGNOSIS — F0390 Unspecified dementia without behavioral disturbance: Secondary | ICD-10-CM | POA: Diagnosis not present

## 2018-02-21 DIAGNOSIS — R001 Bradycardia, unspecified: Secondary | ICD-10-CM | POA: Diagnosis not present

## 2018-02-21 DIAGNOSIS — N189 Chronic kidney disease, unspecified: Secondary | ICD-10-CM | POA: Diagnosis not present

## 2018-02-21 DIAGNOSIS — I2510 Atherosclerotic heart disease of native coronary artery without angina pectoris: Secondary | ICD-10-CM | POA: Diagnosis not present

## 2018-02-21 DIAGNOSIS — G250 Essential tremor: Secondary | ICD-10-CM | POA: Diagnosis not present

## 2018-02-21 DIAGNOSIS — I509 Heart failure, unspecified: Secondary | ICD-10-CM | POA: Diagnosis not present

## 2018-02-22 DIAGNOSIS — I2510 Atherosclerotic heart disease of native coronary artery without angina pectoris: Secondary | ICD-10-CM | POA: Diagnosis not present

## 2018-02-22 DIAGNOSIS — I509 Heart failure, unspecified: Secondary | ICD-10-CM | POA: Diagnosis not present

## 2018-02-22 DIAGNOSIS — G250 Essential tremor: Secondary | ICD-10-CM | POA: Diagnosis not present

## 2018-02-22 DIAGNOSIS — R001 Bradycardia, unspecified: Secondary | ICD-10-CM | POA: Diagnosis not present

## 2018-02-22 DIAGNOSIS — N189 Chronic kidney disease, unspecified: Secondary | ICD-10-CM | POA: Diagnosis not present

## 2018-02-22 DIAGNOSIS — F0390 Unspecified dementia without behavioral disturbance: Secondary | ICD-10-CM | POA: Diagnosis not present

## 2018-02-23 DIAGNOSIS — F0390 Unspecified dementia without behavioral disturbance: Secondary | ICD-10-CM | POA: Diagnosis not present

## 2018-02-23 DIAGNOSIS — R001 Bradycardia, unspecified: Secondary | ICD-10-CM | POA: Diagnosis not present

## 2018-02-23 DIAGNOSIS — I509 Heart failure, unspecified: Secondary | ICD-10-CM | POA: Diagnosis not present

## 2018-02-23 DIAGNOSIS — G250 Essential tremor: Secondary | ICD-10-CM | POA: Diagnosis not present

## 2018-02-23 DIAGNOSIS — I2510 Atherosclerotic heart disease of native coronary artery without angina pectoris: Secondary | ICD-10-CM | POA: Diagnosis not present

## 2018-02-23 DIAGNOSIS — N189 Chronic kidney disease, unspecified: Secondary | ICD-10-CM | POA: Diagnosis not present

## 2018-02-26 ENCOUNTER — Ambulatory Visit: Payer: Medicare Other | Admitting: Family Medicine

## 2018-02-26 DIAGNOSIS — I2510 Atherosclerotic heart disease of native coronary artery without angina pectoris: Secondary | ICD-10-CM | POA: Diagnosis not present

## 2018-02-26 DIAGNOSIS — F0390 Unspecified dementia without behavioral disturbance: Secondary | ICD-10-CM | POA: Diagnosis not present

## 2018-02-26 DIAGNOSIS — G250 Essential tremor: Secondary | ICD-10-CM | POA: Diagnosis not present

## 2018-02-26 DIAGNOSIS — L03116 Cellulitis of left lower limb: Secondary | ICD-10-CM | POA: Diagnosis not present

## 2018-02-26 DIAGNOSIS — R001 Bradycardia, unspecified: Secondary | ICD-10-CM | POA: Diagnosis not present

## 2018-02-26 DIAGNOSIS — N189 Chronic kidney disease, unspecified: Secondary | ICD-10-CM | POA: Diagnosis not present

## 2018-02-26 DIAGNOSIS — I509 Heart failure, unspecified: Secondary | ICD-10-CM | POA: Diagnosis not present

## 2018-02-26 NOTE — Progress Notes (Signed)
Home visit The patient is homebound Hospice patient Having left foot pain discomfort redness. No swelling in the calf Denies high fever chills. Has underlying heart failure On today's exam her foot is red tender calf is normal ankle normal lungs are clear no crackles heart rate is controlled  Cellulitis of the foot Cefzil was called into the pharmacy warning signs discussed let us know if not improving over the next several days follow-up sooner problems  25 minutes was spent with the patient.  This statement verifies that 25 minutes was indeed spent with the patient.  More than 50% of this visit-total duration of the visit-was spent in counseling and coordination of care. The issues that the patient came in for today as reflected in the diagnosis (s) please refer to documentation for further details.

## 2018-02-28 DIAGNOSIS — F0390 Unspecified dementia without behavioral disturbance: Secondary | ICD-10-CM | POA: Diagnosis not present

## 2018-02-28 DIAGNOSIS — N189 Chronic kidney disease, unspecified: Secondary | ICD-10-CM | POA: Diagnosis not present

## 2018-02-28 DIAGNOSIS — G250 Essential tremor: Secondary | ICD-10-CM | POA: Diagnosis not present

## 2018-02-28 DIAGNOSIS — R001 Bradycardia, unspecified: Secondary | ICD-10-CM | POA: Diagnosis not present

## 2018-02-28 DIAGNOSIS — I2510 Atherosclerotic heart disease of native coronary artery without angina pectoris: Secondary | ICD-10-CM | POA: Diagnosis not present

## 2018-02-28 DIAGNOSIS — I509 Heart failure, unspecified: Secondary | ICD-10-CM | POA: Diagnosis not present

## 2018-03-02 DIAGNOSIS — I2510 Atherosclerotic heart disease of native coronary artery without angina pectoris: Secondary | ICD-10-CM | POA: Diagnosis not present

## 2018-03-02 DIAGNOSIS — F0390 Unspecified dementia without behavioral disturbance: Secondary | ICD-10-CM | POA: Diagnosis not present

## 2018-03-02 DIAGNOSIS — G250 Essential tremor: Secondary | ICD-10-CM | POA: Diagnosis not present

## 2018-03-02 DIAGNOSIS — I509 Heart failure, unspecified: Secondary | ICD-10-CM | POA: Diagnosis not present

## 2018-03-02 DIAGNOSIS — R001 Bradycardia, unspecified: Secondary | ICD-10-CM | POA: Diagnosis not present

## 2018-03-02 DIAGNOSIS — N189 Chronic kidney disease, unspecified: Secondary | ICD-10-CM | POA: Diagnosis not present

## 2018-03-04 ENCOUNTER — Other Ambulatory Visit: Payer: Self-pay | Admitting: Family Medicine

## 2018-03-05 DIAGNOSIS — I2510 Atherosclerotic heart disease of native coronary artery without angina pectoris: Secondary | ICD-10-CM | POA: Diagnosis not present

## 2018-03-05 DIAGNOSIS — N189 Chronic kidney disease, unspecified: Secondary | ICD-10-CM | POA: Diagnosis not present

## 2018-03-05 DIAGNOSIS — R001 Bradycardia, unspecified: Secondary | ICD-10-CM | POA: Diagnosis not present

## 2018-03-05 DIAGNOSIS — F0390 Unspecified dementia without behavioral disturbance: Secondary | ICD-10-CM | POA: Diagnosis not present

## 2018-03-05 DIAGNOSIS — I509 Heart failure, unspecified: Secondary | ICD-10-CM | POA: Diagnosis not present

## 2018-03-05 DIAGNOSIS — G250 Essential tremor: Secondary | ICD-10-CM | POA: Diagnosis not present

## 2018-03-05 NOTE — Telephone Encounter (Signed)
Please verify patient is taking this May have 30-day supply with 6 refills if still taking

## 2018-03-07 DIAGNOSIS — I509 Heart failure, unspecified: Secondary | ICD-10-CM | POA: Diagnosis not present

## 2018-03-07 DIAGNOSIS — R001 Bradycardia, unspecified: Secondary | ICD-10-CM | POA: Diagnosis not present

## 2018-03-07 DIAGNOSIS — G250 Essential tremor: Secondary | ICD-10-CM | POA: Diagnosis not present

## 2018-03-07 DIAGNOSIS — I2510 Atherosclerotic heart disease of native coronary artery without angina pectoris: Secondary | ICD-10-CM | POA: Diagnosis not present

## 2018-03-07 DIAGNOSIS — F0390 Unspecified dementia without behavioral disturbance: Secondary | ICD-10-CM | POA: Diagnosis not present

## 2018-03-07 DIAGNOSIS — N189 Chronic kidney disease, unspecified: Secondary | ICD-10-CM | POA: Diagnosis not present

## 2018-03-09 ENCOUNTER — Other Ambulatory Visit: Payer: Self-pay | Admitting: Family Medicine

## 2018-03-09 DIAGNOSIS — I509 Heart failure, unspecified: Secondary | ICD-10-CM | POA: Diagnosis not present

## 2018-03-09 DIAGNOSIS — G250 Essential tremor: Secondary | ICD-10-CM | POA: Diagnosis not present

## 2018-03-09 DIAGNOSIS — N189 Chronic kidney disease, unspecified: Secondary | ICD-10-CM | POA: Diagnosis not present

## 2018-03-09 DIAGNOSIS — F0390 Unspecified dementia without behavioral disturbance: Secondary | ICD-10-CM | POA: Diagnosis not present

## 2018-03-09 DIAGNOSIS — R001 Bradycardia, unspecified: Secondary | ICD-10-CM | POA: Diagnosis not present

## 2018-03-09 DIAGNOSIS — I2510 Atherosclerotic heart disease of native coronary artery without angina pectoris: Secondary | ICD-10-CM | POA: Diagnosis not present

## 2018-03-12 DIAGNOSIS — F0390 Unspecified dementia without behavioral disturbance: Secondary | ICD-10-CM | POA: Diagnosis not present

## 2018-03-12 DIAGNOSIS — I2510 Atherosclerotic heart disease of native coronary artery without angina pectoris: Secondary | ICD-10-CM | POA: Diagnosis not present

## 2018-03-12 DIAGNOSIS — I509 Heart failure, unspecified: Secondary | ICD-10-CM | POA: Diagnosis not present

## 2018-03-12 DIAGNOSIS — N189 Chronic kidney disease, unspecified: Secondary | ICD-10-CM | POA: Diagnosis not present

## 2018-03-12 DIAGNOSIS — R001 Bradycardia, unspecified: Secondary | ICD-10-CM | POA: Diagnosis not present

## 2018-03-12 DIAGNOSIS — G250 Essential tremor: Secondary | ICD-10-CM | POA: Diagnosis not present

## 2018-03-14 DIAGNOSIS — N189 Chronic kidney disease, unspecified: Secondary | ICD-10-CM | POA: Diagnosis not present

## 2018-03-14 DIAGNOSIS — I2510 Atherosclerotic heart disease of native coronary artery without angina pectoris: Secondary | ICD-10-CM | POA: Diagnosis not present

## 2018-03-14 DIAGNOSIS — R001 Bradycardia, unspecified: Secondary | ICD-10-CM | POA: Diagnosis not present

## 2018-03-14 DIAGNOSIS — I509 Heart failure, unspecified: Secondary | ICD-10-CM | POA: Diagnosis not present

## 2018-03-14 DIAGNOSIS — F0390 Unspecified dementia without behavioral disturbance: Secondary | ICD-10-CM | POA: Diagnosis not present

## 2018-03-14 DIAGNOSIS — G250 Essential tremor: Secondary | ICD-10-CM | POA: Diagnosis not present

## 2018-03-16 ENCOUNTER — Other Ambulatory Visit: Payer: Self-pay | Admitting: Family Medicine

## 2018-03-16 DIAGNOSIS — F0390 Unspecified dementia without behavioral disturbance: Secondary | ICD-10-CM | POA: Diagnosis not present

## 2018-03-16 DIAGNOSIS — N189 Chronic kidney disease, unspecified: Secondary | ICD-10-CM | POA: Diagnosis not present

## 2018-03-16 DIAGNOSIS — G250 Essential tremor: Secondary | ICD-10-CM | POA: Diagnosis not present

## 2018-03-16 DIAGNOSIS — I509 Heart failure, unspecified: Secondary | ICD-10-CM | POA: Diagnosis not present

## 2018-03-16 DIAGNOSIS — R001 Bradycardia, unspecified: Secondary | ICD-10-CM | POA: Diagnosis not present

## 2018-03-16 DIAGNOSIS — I2510 Atherosclerotic heart disease of native coronary artery without angina pectoris: Secondary | ICD-10-CM | POA: Diagnosis not present

## 2018-03-17 DIAGNOSIS — N189 Chronic kidney disease, unspecified: Secondary | ICD-10-CM | POA: Diagnosis not present

## 2018-03-17 DIAGNOSIS — R001 Bradycardia, unspecified: Secondary | ICD-10-CM | POA: Diagnosis not present

## 2018-03-17 DIAGNOSIS — I509 Heart failure, unspecified: Secondary | ICD-10-CM | POA: Diagnosis not present

## 2018-03-17 DIAGNOSIS — I2781 Cor pulmonale (chronic): Secondary | ICD-10-CM | POA: Diagnosis not present

## 2018-03-17 DIAGNOSIS — F0390 Unspecified dementia without behavioral disturbance: Secondary | ICD-10-CM | POA: Diagnosis not present

## 2018-03-17 DIAGNOSIS — I2510 Atherosclerotic heart disease of native coronary artery without angina pectoris: Secondary | ICD-10-CM | POA: Diagnosis not present

## 2018-03-17 DIAGNOSIS — E785 Hyperlipidemia, unspecified: Secondary | ICD-10-CM | POA: Diagnosis not present

## 2018-03-17 DIAGNOSIS — G250 Essential tremor: Secondary | ICD-10-CM | POA: Diagnosis not present

## 2018-03-19 ENCOUNTER — Encounter: Payer: Self-pay | Admitting: Family Medicine

## 2018-03-19 DIAGNOSIS — I509 Heart failure, unspecified: Secondary | ICD-10-CM | POA: Diagnosis not present

## 2018-03-19 DIAGNOSIS — I2510 Atherosclerotic heart disease of native coronary artery without angina pectoris: Secondary | ICD-10-CM | POA: Diagnosis not present

## 2018-03-19 DIAGNOSIS — N189 Chronic kidney disease, unspecified: Secondary | ICD-10-CM | POA: Diagnosis not present

## 2018-03-19 DIAGNOSIS — R001 Bradycardia, unspecified: Secondary | ICD-10-CM | POA: Diagnosis not present

## 2018-03-19 DIAGNOSIS — F0390 Unspecified dementia without behavioral disturbance: Secondary | ICD-10-CM | POA: Diagnosis not present

## 2018-03-19 DIAGNOSIS — G250 Essential tremor: Secondary | ICD-10-CM | POA: Diagnosis not present

## 2018-03-20 ENCOUNTER — Other Ambulatory Visit: Payer: Self-pay | Admitting: Family Medicine

## 2018-03-20 ENCOUNTER — Ambulatory Visit: Payer: Medicare Other | Admitting: Family Medicine

## 2018-03-20 DIAGNOSIS — R64 Cachexia: Secondary | ICD-10-CM

## 2018-03-20 DIAGNOSIS — L03113 Cellulitis of right upper limb: Secondary | ICD-10-CM | POA: Diagnosis not present

## 2018-03-20 DIAGNOSIS — D692 Other nonthrombocytopenic purpura: Secondary | ICD-10-CM

## 2018-03-20 DIAGNOSIS — J9611 Chronic respiratory failure with hypoxia: Secondary | ICD-10-CM

## 2018-03-20 DIAGNOSIS — I5032 Chronic diastolic (congestive) heart failure: Secondary | ICD-10-CM | POA: Diagnosis not present

## 2018-03-20 DIAGNOSIS — J439 Emphysema, unspecified: Secondary | ICD-10-CM

## 2018-03-20 IMAGING — DX DG HIP (WITH OR WITHOUT PELVIS) 2-3V*R*
3 series · 3 of 3 positions shown · non-contrast
Comparison: Pelvis and right hip December 03, 2015

CLINICAL DATA: Patient's slipped and fell today and now complains
of right hip pain.

EXAM:
DG HIP (WITH OR WITHOUT PELVIS) 2-3V RIGHT

[pelvis ap]
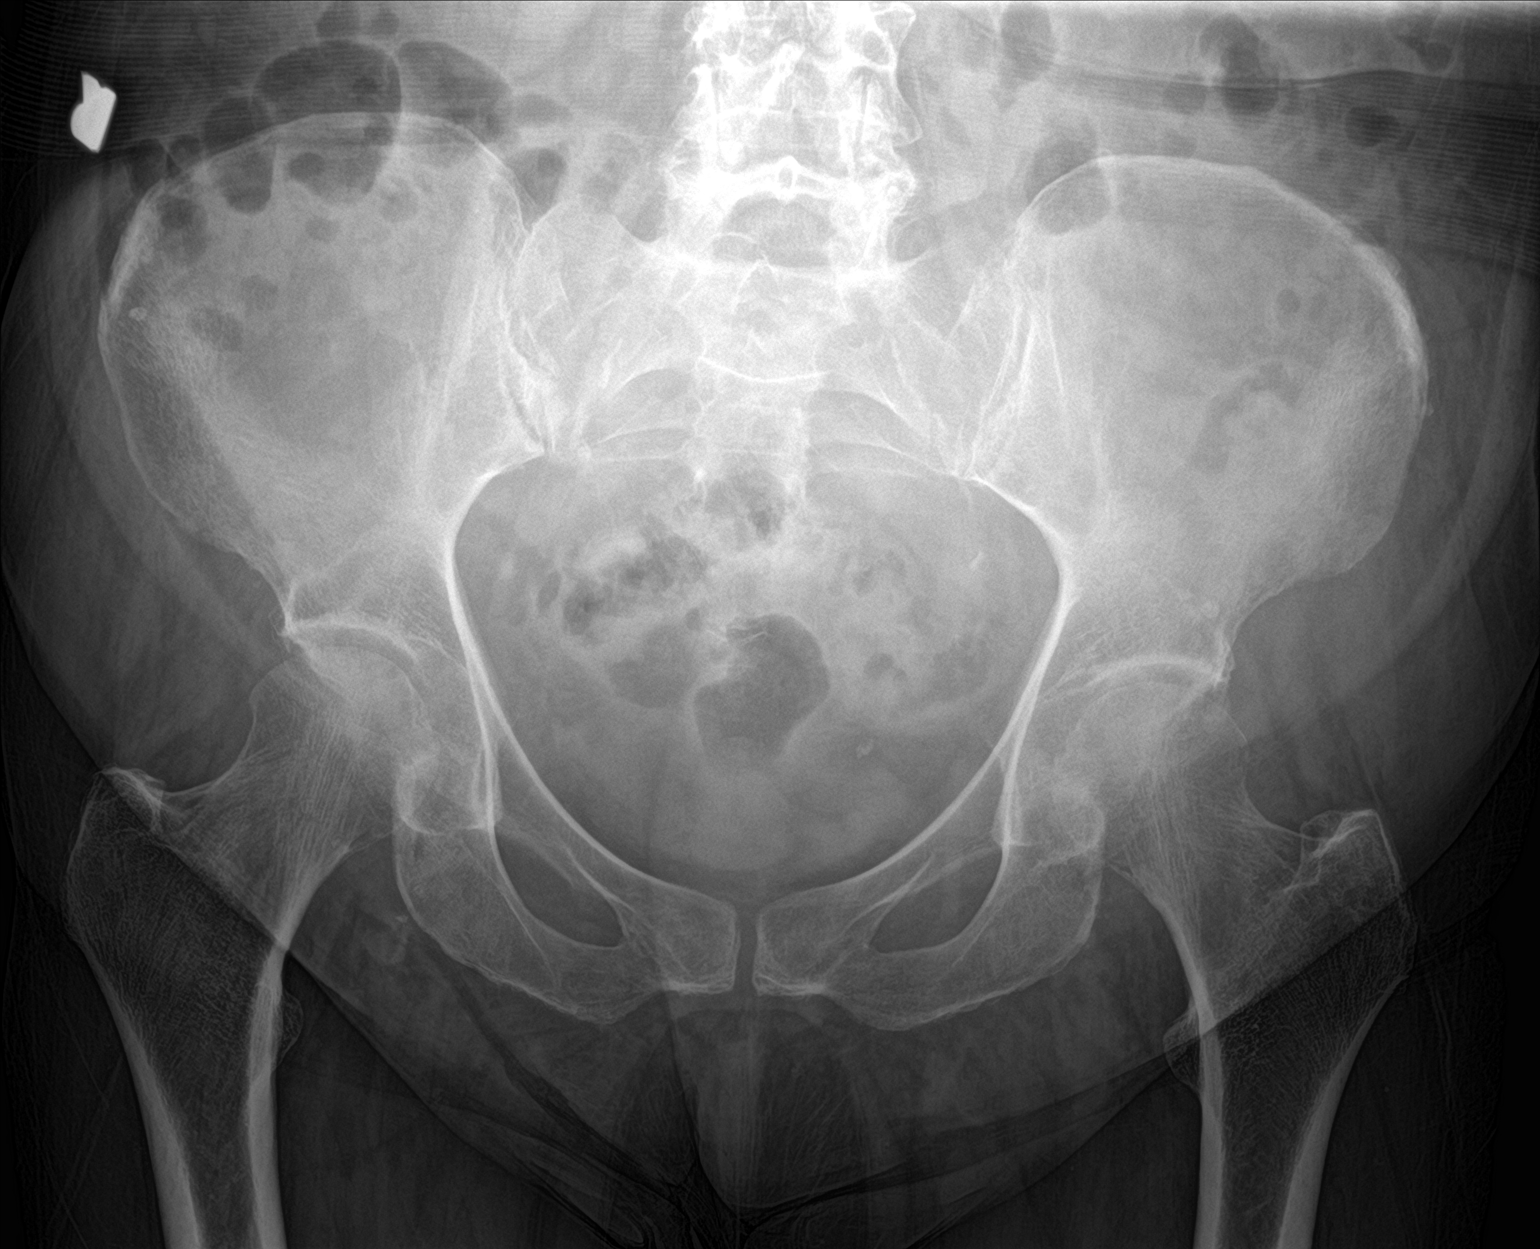

[hip ap]
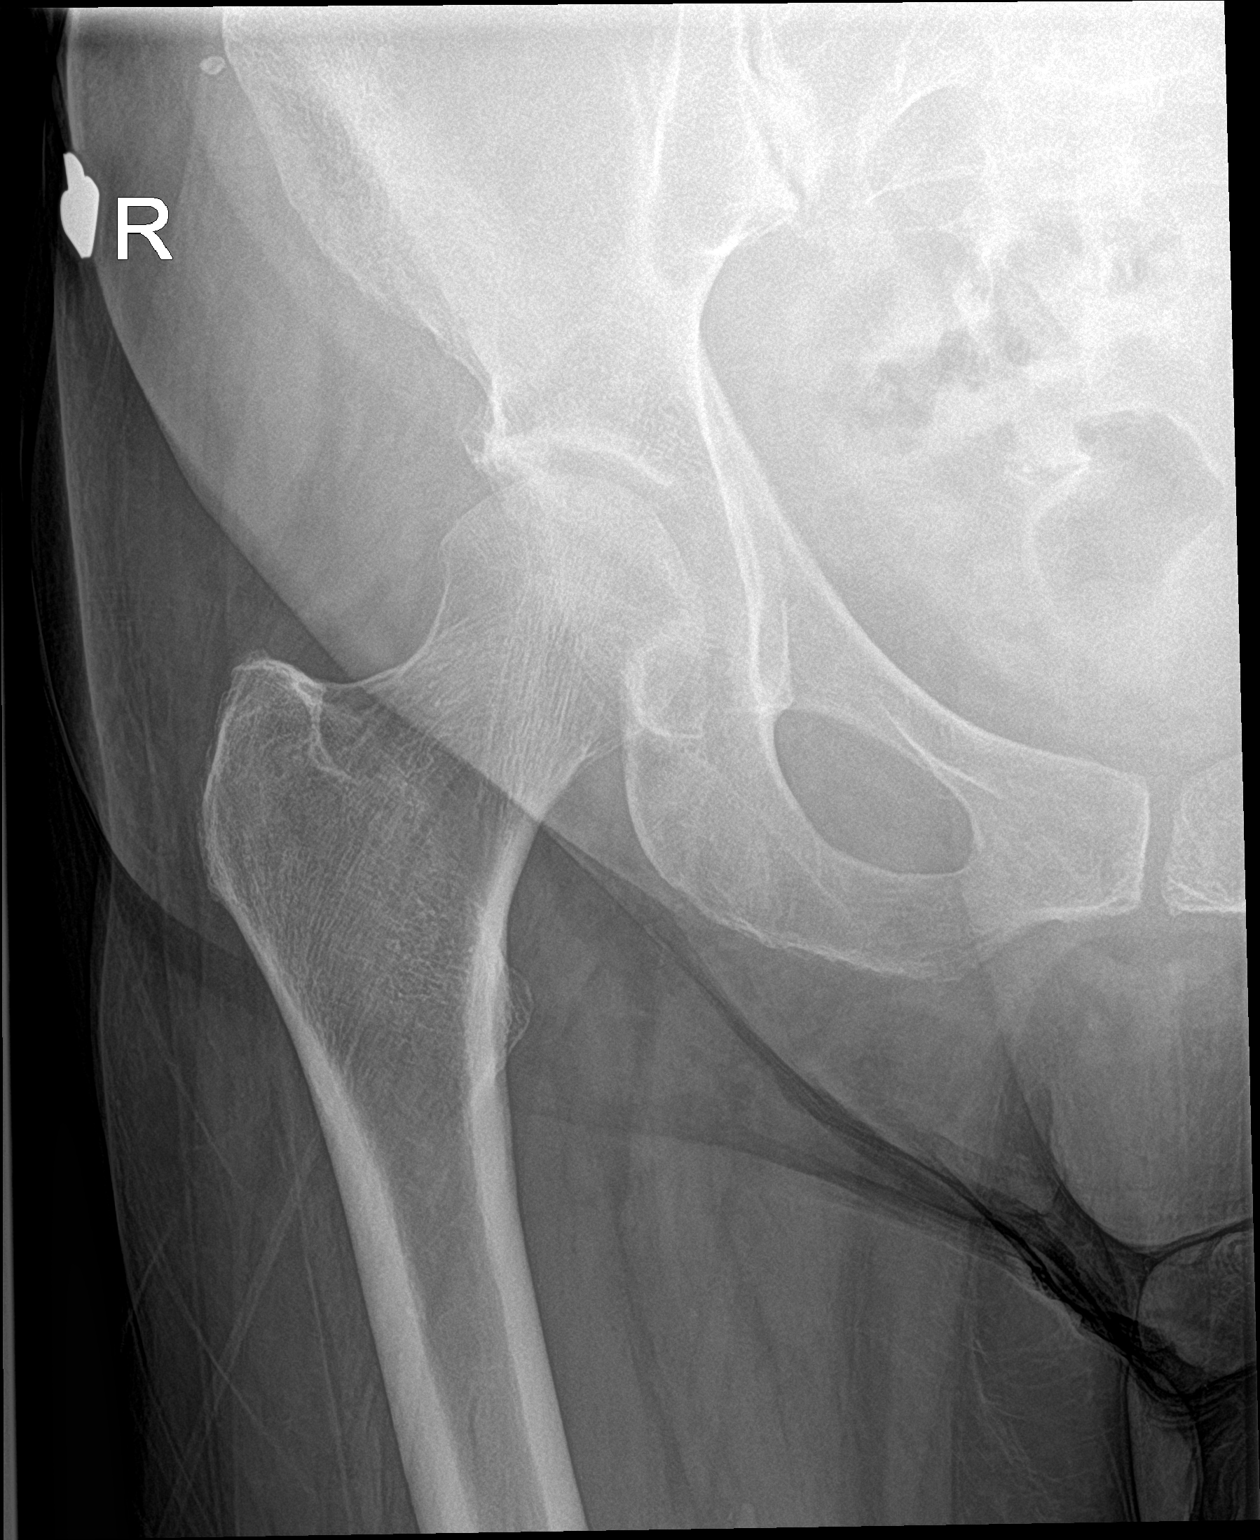

[hip lat]
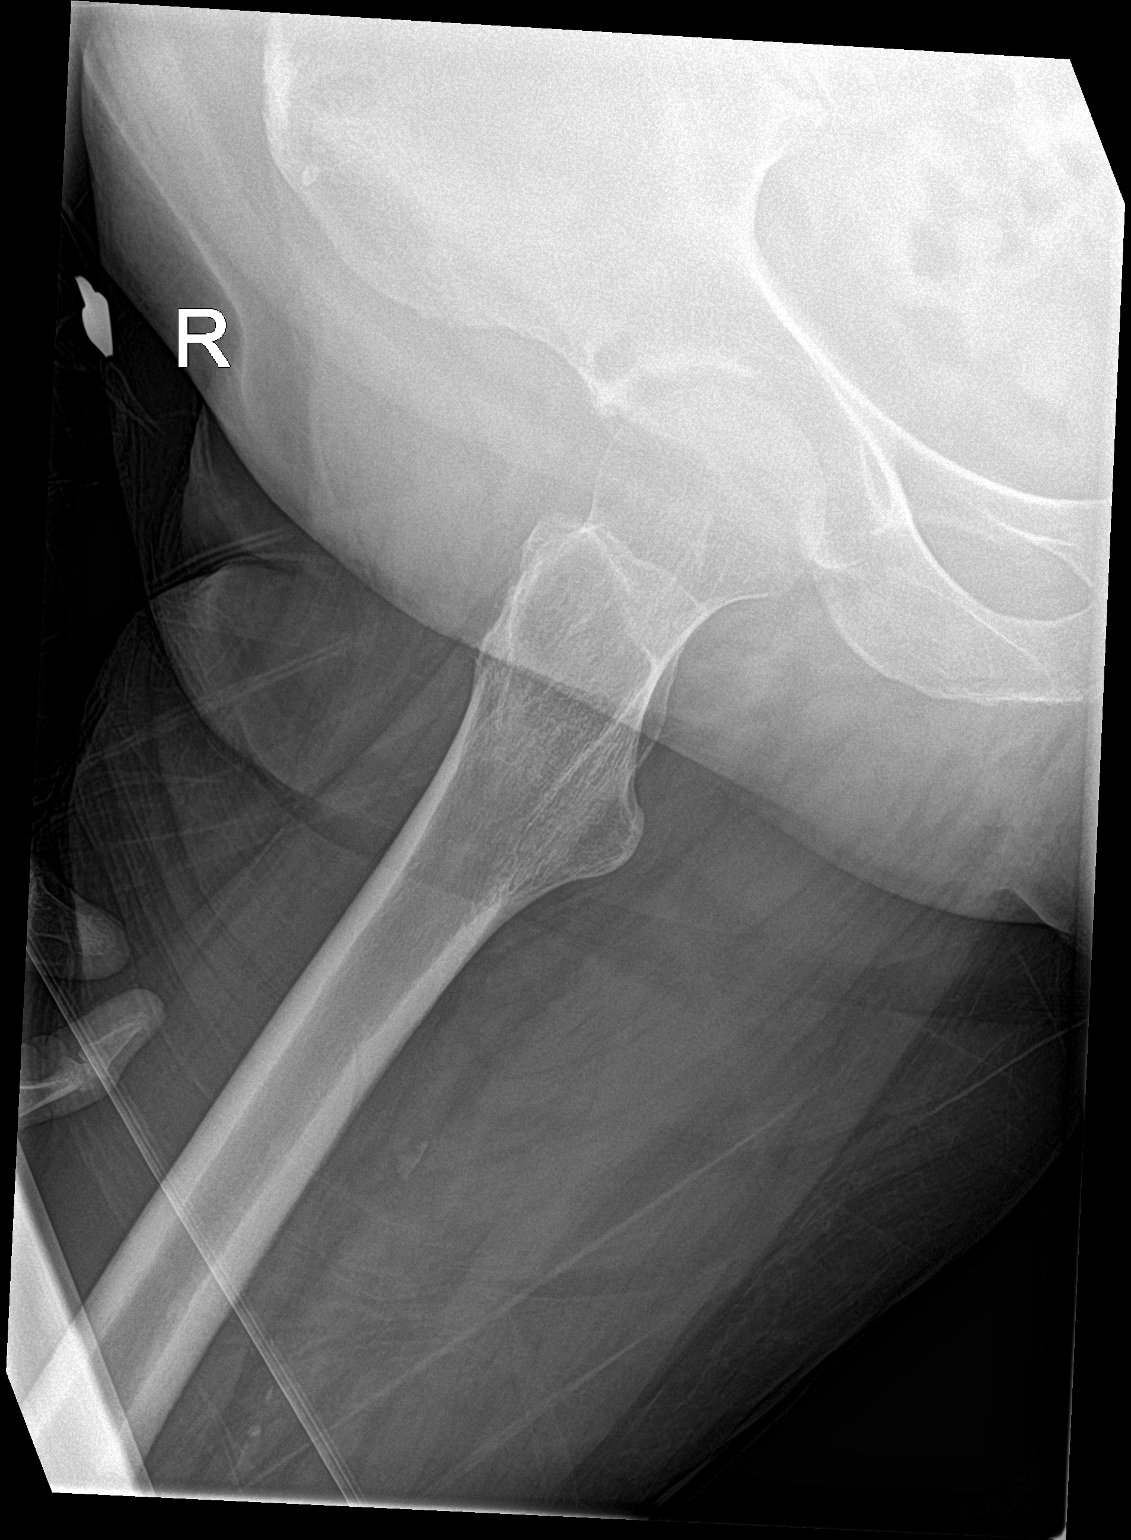

[3 of 3 positions shown; findings below may reference images not displayed]

FINDINGS: The bones are subjectively osteopenic. The pelvis exhibits no acute
fracture or lytic or blastic lesion. AP and lateral views of the
right hip reveal preservation of the joint space. The articular
surfaces of the femoral head and acetabulum remains smoothly
rounded. The femoral neck, intertrochanteric, and subtrochanteric
regions are normal. The soft tissues of the pelvis exhibit no acute
abnormalities.
IMPRESSION: There is no acute or significant chronic bony abnormality of the
right hip.

## 2018-03-20 MED ORDER — CEPHALEXIN 500 MG PO CAPS: ORAL_CAPSULE | ORAL | 0 refills | Status: AC

## 2018-03-20 NOTE — Progress Notes (Signed)
Home visit Reason for home visit hospice level CHF with cachexia Losing weight Also right elbow infection She leans on her elbows on a regular basis throughout the day her elbows have dry patches with calluses On the right side it became red infected and swollen and somewhat tender.  We switched her antibiotic from Cipro she was on for UTI New antibiotic Keflex 500 mg 4 times daily On examination today her lungs are clear heart irregular rate control blood pressure good extremities no edema moderate cachexia noted Also right olecranon infection with probable abscess CHF seems to be stable currently Senile purpura noted on the arms stable COPD poor prognosis Hypoxia on 2 L   Cachexia CHF terminal on COPD terminal Right elbow infection with possible olecranon bursitis versus abscess Referral to orthopedics they may need to do 18-gauge needle aspiration Keflex 4 times daily Follow-up if any ongoing troubles. We will be adjusting her medications For now I recommend stopping metolazone.

## 2018-03-20 NOTE — Telephone Encounter (Signed)
Nurses please connect with patient family I would recommend switching over to Keflex 500 mg 4 times daily for the next 7 days Stop Cipro  As for the elbow warm compresses frequently More than likely will need to see orthopedist this week If the family would like I can do a home visit this evening when I finished work here Please see what they would like to do

## 2018-03-21 ENCOUNTER — Other Ambulatory Visit: Payer: Self-pay | Admitting: *Deleted

## 2018-03-21 ENCOUNTER — Encounter: Payer: Self-pay | Admitting: *Deleted

## 2018-03-21 DIAGNOSIS — N189 Chronic kidney disease, unspecified: Secondary | ICD-10-CM | POA: Diagnosis not present

## 2018-03-21 DIAGNOSIS — I509 Heart failure, unspecified: Secondary | ICD-10-CM | POA: Diagnosis not present

## 2018-03-21 DIAGNOSIS — R001 Bradycardia, unspecified: Secondary | ICD-10-CM | POA: Diagnosis not present

## 2018-03-21 DIAGNOSIS — G250 Essential tremor: Secondary | ICD-10-CM | POA: Diagnosis not present

## 2018-03-21 DIAGNOSIS — F0390 Unspecified dementia without behavioral disturbance: Secondary | ICD-10-CM | POA: Diagnosis not present

## 2018-03-21 DIAGNOSIS — I2510 Atherosclerotic heart disease of native coronary artery without angina pectoris: Secondary | ICD-10-CM | POA: Diagnosis not present

## 2018-03-21 NOTE — Progress Notes (Signed)
[name] a my chart message stating metolazone has been canceled and called [facility] in [place] where it was last sent to and canceled any refills. Had to leave voicemail for pharm.

## 2018-03-22 ENCOUNTER — Telehealth: Payer: Self-pay | Admitting: Family Medicine

## 2018-03-22 DIAGNOSIS — I2510 Atherosclerotic heart disease of native coronary artery without angina pectoris: Secondary | ICD-10-CM | POA: Diagnosis not present

## 2018-03-22 DIAGNOSIS — R001 Bradycardia, unspecified: Secondary | ICD-10-CM | POA: Diagnosis not present

## 2018-03-22 DIAGNOSIS — G250 Essential tremor: Secondary | ICD-10-CM | POA: Diagnosis not present

## 2018-03-22 DIAGNOSIS — F0390 Unspecified dementia without behavioral disturbance: Secondary | ICD-10-CM | POA: Diagnosis not present

## 2018-03-22 DIAGNOSIS — I509 Heart failure, unspecified: Secondary | ICD-10-CM | POA: Diagnosis not present

## 2018-03-22 DIAGNOSIS — N189 Chronic kidney disease, unspecified: Secondary | ICD-10-CM | POA: Diagnosis not present

## 2018-04-15 NOTE — Telephone Encounter (Signed)
Granddaughter(Stacy) wanted you to know that patient has taking a turn for the worst. She didn't eat and drink anything yesterday and not making any urine.Taking morfin every two hours by hospice nurse keeping her at home for now.

## 2018-04-15 DEATH — deceased

## 2018-05-10 IMAGING — DX DG CHEST 2V
2 series · 2 of 2 positions shown · non-contrast
Comparison: 01/13/2016

CLINICAL DATA: Shortness of breath, worsening

EXAM:
CHEST  2 VIEW

[chest pa]
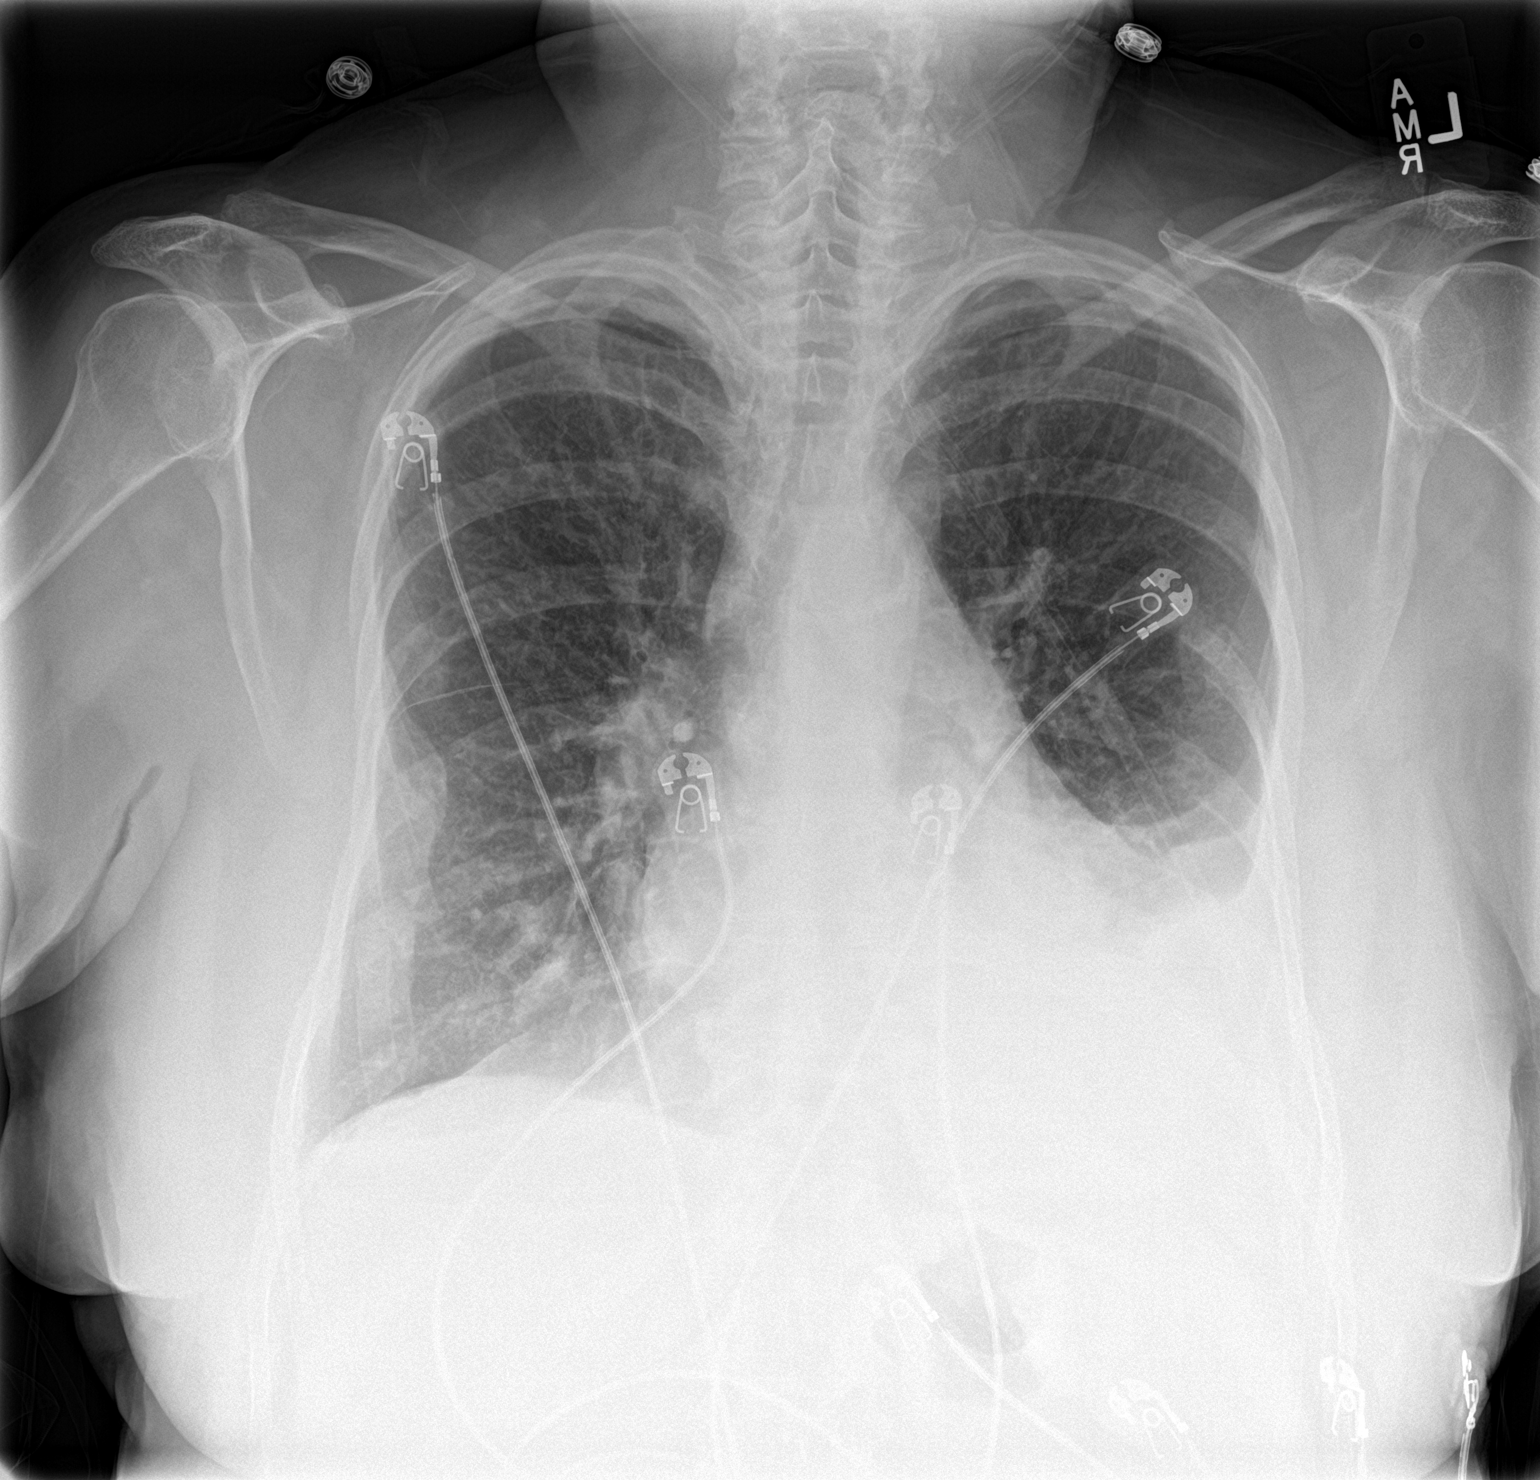

[chest lat]
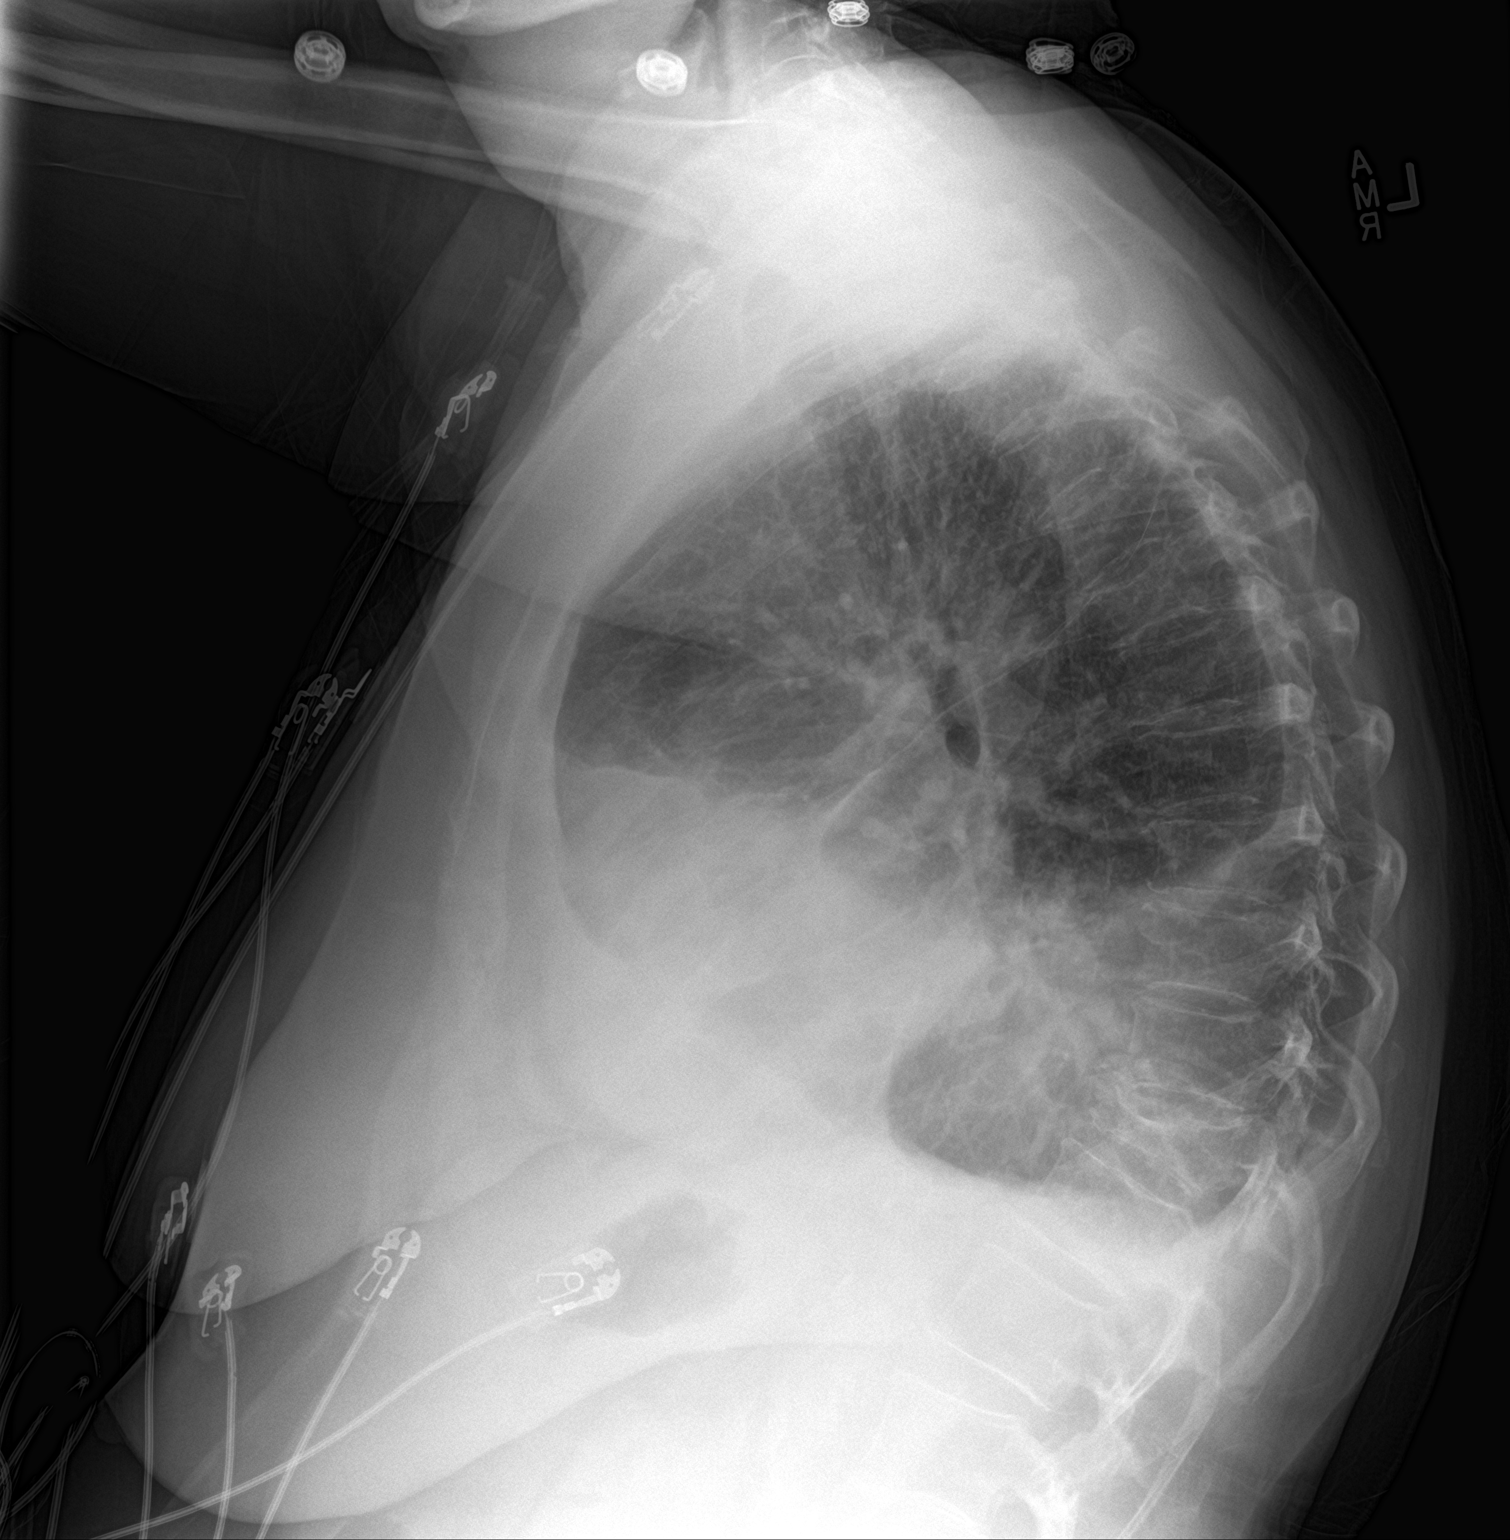

[2 of 2 positions shown; findings below may reference images not displayed]

FINDINGS: Moderate left pleural effusion which has increased compared with
01/13/2016.

No right pleural effusion. No pneumothorax. No focal consolidation.
Stable cardiomegaly. Mild bilateral interstitial thickening.

Multiple healing right anterolateral rib fractures.  A
IMPRESSION: 1. Moderate left pleural effusion which has increased in size
compared with 01/13/2016.
2. Cardiomegaly with mild pulmonary vascular congestion.
